# Patient Record
Sex: Male | Born: 1940 | Race: White | Hispanic: No | Marital: Married | Smoking: Never smoker
Health system: Southern US, Community
[De-identification: ages and names within clinical notes are randomized; demographics above are authoritative.]

## PROBLEM LIST (undated history)

## (undated) DIAGNOSIS — E291 Testicular hypofunction: Secondary | ICD-10-CM

## (undated) DIAGNOSIS — C8590 Non-Hodgkin lymphoma, unspecified, unspecified site: Secondary | ICD-10-CM

## (undated) DIAGNOSIS — T45515A Adverse effect of anticoagulants, initial encounter: Secondary | ICD-10-CM

## (undated) DIAGNOSIS — I10 Essential (primary) hypertension: Secondary | ICD-10-CM

## (undated) DIAGNOSIS — I719 Aortic aneurysm of unspecified site, without rupture: Secondary | ICD-10-CM

## (undated) DIAGNOSIS — I4819 Other persistent atrial fibrillation: Secondary | ICD-10-CM

## (undated) DIAGNOSIS — E785 Hyperlipidemia, unspecified: Secondary | ICD-10-CM

## (undated) DIAGNOSIS — E0590 Thyrotoxicosis, unspecified without thyrotoxic crisis or storm: Secondary | ICD-10-CM

## (undated) HISTORY — PX: BACK SURGERY: SHX140

## (undated) HISTORY — DX: Adverse effect of anticoagulants, initial encounter: T45.515A

## (undated) HISTORY — PX: TONSILLECTOMY: SUR1361

## (undated) HISTORY — PX: TOTAL KNEE ARTHROPLASTY: SHX125

## (undated) HISTORY — DX: Hyperlipidemia, unspecified: E78.5

## (undated) HISTORY — PX: LAMINECTOMY: SHX219

## (undated) HISTORY — DX: Testicular hypofunction: E29.1

## (undated) HISTORY — DX: Other persistent atrial fibrillation: I48.19

## (undated) HISTORY — DX: Aortic aneurysm of unspecified site, without rupture: I71.9

## (undated) HISTORY — DX: Thyrotoxicosis, unspecified without thyrotoxic crisis or storm: E05.90

## (undated) HISTORY — DX: Essential (primary) hypertension: I10

## (undated) HISTORY — DX: Non-Hodgkin lymphoma, unspecified, unspecified site: C85.90

## (undated) HISTORY — PX: ATRIAL ABLATION SURGERY: SHX560

## (undated) HISTORY — PX: APPENDECTOMY: SHX54

---

## 2005-05-22 ENCOUNTER — Ambulatory Visit: Payer: Self-pay | Admitting: Internal Medicine

## 2005-08-23 ENCOUNTER — Ambulatory Visit (HOSPITAL_COMMUNITY): Admission: RE | Admit: 2005-08-23 | Discharge: 2005-08-23 | Payer: Self-pay | Admitting: Internal Medicine

## 2005-08-23 ENCOUNTER — Ambulatory Visit: Payer: Self-pay | Admitting: Internal Medicine

## 2005-09-15 ENCOUNTER — Ambulatory Visit: Payer: Self-pay | Admitting: Cardiology

## 2005-09-22 ENCOUNTER — Ambulatory Visit: Payer: Self-pay | Admitting: Cardiovascular Disease

## 2005-10-05 ENCOUNTER — Ambulatory Visit: Payer: Self-pay | Admitting: Cardiology

## 2005-10-05 ENCOUNTER — Ambulatory Visit: Payer: Self-pay | Admitting: Endocrinology

## 2005-11-06 ENCOUNTER — Ambulatory Visit: Payer: Self-pay | Admitting: Endocrinology

## 2005-11-06 ENCOUNTER — Ambulatory Visit: Payer: Self-pay | Admitting: Internal Medicine

## 2006-01-08 ENCOUNTER — Ambulatory Visit: Payer: Self-pay | Admitting: Cardiovascular Disease

## 2006-01-29 ENCOUNTER — Ambulatory Visit: Payer: Self-pay

## 2006-01-29 ENCOUNTER — Ambulatory Visit: Payer: Self-pay | Admitting: Cardiology

## 2006-01-29 HISTORY — PX: OTHER SURGICAL HISTORY: SHX169

## 2006-02-06 ENCOUNTER — Ambulatory Visit: Payer: Self-pay | Admitting: Internal Medicine

## 2006-02-08 ENCOUNTER — Ambulatory Visit: Payer: Self-pay

## 2006-02-08 ENCOUNTER — Encounter: Payer: Self-pay | Admitting: Cardiology

## 2006-02-08 ENCOUNTER — Ambulatory Visit: Payer: Self-pay | Admitting: Internal Medicine

## 2006-02-08 HISTORY — PX: TRANSTHORACIC ECHOCARDIOGRAM: SHX275

## 2006-02-12 ENCOUNTER — Ambulatory Visit: Payer: Self-pay | Admitting: Cardiology

## 2006-02-21 ENCOUNTER — Ambulatory Visit: Payer: Self-pay | Admitting: Cardiology

## 2006-02-21 ENCOUNTER — Ambulatory Visit: Payer: Self-pay

## 2006-02-26 ENCOUNTER — Ambulatory Visit: Payer: Self-pay | Admitting: Family Medicine

## 2006-03-12 ENCOUNTER — Ambulatory Visit: Payer: Self-pay | Admitting: Cardiology

## 2006-04-04 ENCOUNTER — Ambulatory Visit: Payer: Self-pay | Admitting: Internal Medicine

## 2006-04-13 ENCOUNTER — Ambulatory Visit: Payer: Self-pay | Admitting: Internal Medicine

## 2006-05-01 ENCOUNTER — Ambulatory Visit: Payer: Self-pay | Admitting: Cardiology

## 2006-05-07 ENCOUNTER — Ambulatory Visit: Payer: Self-pay | Admitting: Cardiology

## 2006-05-14 ENCOUNTER — Ambulatory Visit: Payer: Self-pay | Admitting: Cardiology

## 2006-05-28 ENCOUNTER — Ambulatory Visit: Payer: Self-pay | Admitting: Cardiology

## 2006-06-15 ENCOUNTER — Ambulatory Visit: Payer: Self-pay | Admitting: Cardiovascular Disease

## 2006-07-04 ENCOUNTER — Ambulatory Visit: Payer: Self-pay | Admitting: Cardiology

## 2006-08-31 ENCOUNTER — Ambulatory Visit: Payer: Self-pay | Admitting: Cardiology

## 2006-09-20 ENCOUNTER — Ambulatory Visit: Payer: Self-pay | Admitting: Internal Medicine

## 2006-09-26 ENCOUNTER — Ambulatory Visit: Payer: Self-pay | Admitting: Internal Medicine

## 2006-09-26 ENCOUNTER — Inpatient Hospital Stay (HOSPITAL_COMMUNITY): Admission: EM | Admit: 2006-09-26 | Discharge: 2006-09-26 | Payer: Self-pay | Admitting: Emergency Medicine

## 2006-09-26 ENCOUNTER — Encounter: Payer: Self-pay | Admitting: Internal Medicine

## 2006-10-04 ENCOUNTER — Ambulatory Visit: Payer: Self-pay | Admitting: Cardiology

## 2006-10-29 ENCOUNTER — Ambulatory Visit: Payer: Self-pay | Admitting: Internal Medicine

## 2006-11-01 ENCOUNTER — Ambulatory Visit: Payer: Self-pay | Admitting: Cardiology

## 2006-11-22 ENCOUNTER — Ambulatory Visit: Payer: Self-pay | Admitting: Cardiology

## 2006-12-05 ENCOUNTER — Encounter: Payer: Self-pay | Admitting: Endocrinology

## 2006-12-05 DIAGNOSIS — I4891 Unspecified atrial fibrillation: Secondary | ICD-10-CM | POA: Insufficient documentation

## 2006-12-05 DIAGNOSIS — E0590 Thyrotoxicosis, unspecified without thyrotoxic crisis or storm: Secondary | ICD-10-CM | POA: Insufficient documentation

## 2006-12-14 ENCOUNTER — Ambulatory Visit: Payer: Self-pay | Admitting: Cardiology

## 2007-01-17 ENCOUNTER — Ambulatory Visit: Payer: Self-pay | Admitting: Cardiology

## 2007-02-22 ENCOUNTER — Ambulatory Visit: Payer: Self-pay | Admitting: Internal Medicine

## 2007-03-05 ENCOUNTER — Ambulatory Visit: Payer: Self-pay | Admitting: Cardiology

## 2007-03-12 ENCOUNTER — Emergency Department (HOSPITAL_COMMUNITY): Admission: EM | Admit: 2007-03-12 | Discharge: 2007-03-12 | Payer: Self-pay | Admitting: Emergency Medicine

## 2007-03-19 ENCOUNTER — Ambulatory Visit: Payer: Self-pay | Admitting: Cardiology

## 2007-04-12 ENCOUNTER — Ambulatory Visit: Payer: Self-pay | Admitting: Cardiology

## 2007-04-17 ENCOUNTER — Ambulatory Visit: Payer: Self-pay | Admitting: Cardiovascular Disease

## 2007-04-25 ENCOUNTER — Ambulatory Visit: Payer: Self-pay | Admitting: Cardiology

## 2007-04-25 ENCOUNTER — Ambulatory Visit: Payer: Self-pay | Admitting: Internal Medicine

## 2007-05-10 ENCOUNTER — Ambulatory Visit: Payer: Self-pay | Admitting: Cardiology

## 2007-05-10 LAB — CONVERTED CEMR LAB
INR: 3.8 — ABNORMAL HIGH (ref 0.8–1.0)
Prothrombin Time: 24.8 s — ABNORMAL HIGH (ref 10.9–13.3)

## 2007-05-22 ENCOUNTER — Ambulatory Visit: Payer: Self-pay | Admitting: Cardiology

## 2007-06-04 ENCOUNTER — Ambulatory Visit: Payer: Self-pay | Admitting: Internal Medicine

## 2007-06-17 ENCOUNTER — Ambulatory Visit: Payer: Self-pay | Admitting: Internal Medicine

## 2007-06-18 ENCOUNTER — Ambulatory Visit: Payer: Self-pay | Admitting: Cardiology

## 2007-07-09 ENCOUNTER — Ambulatory Visit: Payer: Self-pay | Admitting: Cardiology

## 2007-07-25 ENCOUNTER — Ambulatory Visit: Payer: Self-pay | Admitting: Internal Medicine

## 2007-08-12 ENCOUNTER — Telehealth: Payer: Self-pay | Admitting: Internal Medicine

## 2007-08-15 ENCOUNTER — Ambulatory Visit: Payer: Self-pay | Admitting: Internal Medicine

## 2007-10-07 ENCOUNTER — Ambulatory Visit: Payer: Self-pay | Admitting: Internal Medicine

## 2007-10-25 ENCOUNTER — Ambulatory Visit: Payer: Self-pay | Admitting: Cardiology

## 2007-11-15 ENCOUNTER — Ambulatory Visit: Payer: Self-pay | Admitting: Cardiovascular Disease

## 2007-12-03 ENCOUNTER — Ambulatory Visit: Payer: Self-pay | Admitting: Cardiology

## 2007-12-31 ENCOUNTER — Ambulatory Visit: Payer: Self-pay | Admitting: Internal Medicine

## 2008-01-28 ENCOUNTER — Ambulatory Visit: Payer: Self-pay | Admitting: Cardiology

## 2008-02-25 ENCOUNTER — Ambulatory Visit: Payer: Self-pay | Admitting: Cardiovascular Disease

## 2008-03-03 ENCOUNTER — Ambulatory Visit: Payer: Self-pay | Admitting: Internal Medicine

## 2008-03-03 ENCOUNTER — Encounter: Payer: Self-pay | Admitting: Internal Medicine

## 2008-03-03 ENCOUNTER — Ambulatory Visit: Payer: Self-pay | Admitting: Cardiovascular Disease

## 2008-03-03 ENCOUNTER — Ambulatory Visit (HOSPITAL_COMMUNITY): Admission: RE | Admit: 2008-03-03 | Discharge: 2008-03-03 | Payer: Self-pay | Admitting: Internal Medicine

## 2008-03-10 ENCOUNTER — Ambulatory Visit: Payer: Self-pay | Admitting: Cardiology

## 2008-03-23 ENCOUNTER — Ambulatory Visit: Payer: Self-pay | Admitting: Cardiology

## 2008-03-23 ENCOUNTER — Ambulatory Visit: Payer: Self-pay | Admitting: Internal Medicine

## 2008-03-23 LAB — CONVERTED CEMR LAB
ALT: 17 units/L (ref 0–53)
AST: 16 units/L (ref 0–37)
Alkaline Phosphatase: 78 units/L (ref 39–117)
Bilirubin, Direct: 0.1 mg/dL (ref 0.0–0.3)
CO2: 31 meq/L (ref 19–32)
CRP, High Sensitivity: <1 — ABNORMAL LOW (ref 0.00–5.00)
Chloride: 104 meq/L (ref 96–112)
Creatinine, Ser: 0.7 mg/dL (ref 0.4–1.5)
GFR calc non Af Amer: 119 mL/min
LDL Cholesterol: 121 mg/dL — ABNORMAL HIGH (ref 0–99)
Potassium: 4.3 meq/L (ref 3.5–5.1)
Total Bilirubin: 1.1 mg/dL (ref 0.3–1.2)
Total CHOL/HDL Ratio: 4.8
Triglycerides: 65 mg/dL (ref 0–149)

## 2008-04-24 ENCOUNTER — Ambulatory Visit: Payer: Self-pay | Admitting: Cardiology

## 2008-04-27 ENCOUNTER — Ambulatory Visit: Payer: Self-pay | Admitting: Internal Medicine

## 2008-05-21 ENCOUNTER — Ambulatory Visit: Payer: Self-pay | Admitting: Internal Medicine

## 2008-05-28 ENCOUNTER — Ambulatory Visit: Payer: Self-pay | Admitting: Internal Medicine

## 2008-06-19 ENCOUNTER — Ambulatory Visit: Payer: Self-pay | Admitting: Cardiology

## 2008-07-16 ENCOUNTER — Ambulatory Visit: Payer: Self-pay | Admitting: Internal Medicine

## 2008-08-18 ENCOUNTER — Ambulatory Visit: Payer: Self-pay | Admitting: Internal Medicine

## 2008-09-01 ENCOUNTER — Ambulatory Visit: Payer: Self-pay | Admitting: Cardiology

## 2008-09-01 ENCOUNTER — Encounter: Payer: Self-pay | Admitting: *Deleted

## 2008-09-10 ENCOUNTER — Telehealth: Payer: Self-pay | Admitting: Internal Medicine

## 2008-09-16 ENCOUNTER — Encounter: Payer: Self-pay | Admitting: Internal Medicine

## 2008-09-25 ENCOUNTER — Ambulatory Visit: Payer: Self-pay | Admitting: Cardiology

## 2008-09-25 ENCOUNTER — Encounter (INDEPENDENT_AMBULATORY_CARE_PROVIDER_SITE_OTHER): Payer: Self-pay | Admitting: Pharmacist

## 2008-09-25 LAB — CONVERTED CEMR LAB
POC INR: 2
Prothrombin Time: 17.3 s

## 2008-10-07 ENCOUNTER — Encounter: Payer: Self-pay | Admitting: *Deleted

## 2008-10-23 ENCOUNTER — Ambulatory Visit: Payer: Self-pay | Admitting: Internal Medicine

## 2008-10-23 LAB — CONVERTED CEMR LAB
POC INR: 2.7
Prothrombin Time: 19.9 s

## 2008-11-16 ENCOUNTER — Telehealth: Payer: Self-pay | Admitting: Internal Medicine

## 2008-11-16 ENCOUNTER — Ambulatory Visit: Payer: Self-pay | Admitting: Cardiovascular Disease

## 2008-11-16 ENCOUNTER — Ambulatory Visit: Payer: Self-pay | Admitting: Internal Medicine

## 2008-11-16 DIAGNOSIS — E785 Hyperlipidemia, unspecified: Secondary | ICD-10-CM | POA: Insufficient documentation

## 2008-11-16 DIAGNOSIS — I712 Thoracic aortic aneurysm, without rupture, unspecified: Secondary | ICD-10-CM | POA: Insufficient documentation

## 2008-11-16 LAB — CONVERTED CEMR LAB: POC INR: 2.6

## 2008-11-17 ENCOUNTER — Ambulatory Visit: Payer: Self-pay | Admitting: Internal Medicine

## 2008-11-17 ENCOUNTER — Telehealth: Payer: Self-pay | Admitting: Internal Medicine

## 2008-11-17 ENCOUNTER — Ambulatory Visit (HOSPITAL_COMMUNITY): Admission: RE | Admit: 2008-11-17 | Discharge: 2008-11-17 | Payer: Self-pay | Admitting: Internal Medicine

## 2008-11-25 ENCOUNTER — Ambulatory Visit: Payer: Self-pay | Admitting: Internal Medicine

## 2008-11-26 ENCOUNTER — Encounter: Payer: Self-pay | Admitting: Internal Medicine

## 2008-12-08 ENCOUNTER — Telehealth (INDEPENDENT_AMBULATORY_CARE_PROVIDER_SITE_OTHER): Payer: Self-pay | Admitting: *Deleted

## 2008-12-09 ENCOUNTER — Ambulatory Visit: Payer: Self-pay | Admitting: Internal Medicine

## 2008-12-09 LAB — CONVERTED CEMR LAB: POC INR: 4.2

## 2008-12-11 ENCOUNTER — Ambulatory Visit: Payer: Self-pay | Admitting: Family Medicine

## 2008-12-11 DIAGNOSIS — L0390 Cellulitis, unspecified: Secondary | ICD-10-CM

## 2008-12-11 DIAGNOSIS — L0291 Cutaneous abscess, unspecified: Secondary | ICD-10-CM | POA: Insufficient documentation

## 2008-12-21 ENCOUNTER — Ambulatory Visit: Payer: Self-pay | Admitting: Cardiology

## 2008-12-22 ENCOUNTER — Telehealth: Payer: Self-pay | Admitting: Internal Medicine

## 2008-12-25 ENCOUNTER — Encounter (INDEPENDENT_AMBULATORY_CARE_PROVIDER_SITE_OTHER): Payer: Self-pay | Admitting: *Deleted

## 2008-12-28 ENCOUNTER — Ambulatory Visit: Payer: Self-pay | Admitting: Internal Medicine

## 2008-12-28 DIAGNOSIS — R0602 Shortness of breath: Secondary | ICD-10-CM | POA: Insufficient documentation

## 2008-12-31 ENCOUNTER — Encounter: Payer: Self-pay | Admitting: Internal Medicine

## 2008-12-31 ENCOUNTER — Telehealth: Payer: Self-pay | Admitting: Internal Medicine

## 2009-01-04 ENCOUNTER — Ambulatory Visit: Payer: Self-pay | Admitting: Cardiovascular Disease

## 2009-01-11 ENCOUNTER — Ambulatory Visit: Payer: Self-pay | Admitting: Internal Medicine

## 2009-01-11 LAB — CONVERTED CEMR LAB: POC INR: 2.2

## 2009-01-14 ENCOUNTER — Ambulatory Visit: Payer: Self-pay | Admitting: Cardiovascular Disease

## 2009-01-14 ENCOUNTER — Ambulatory Visit (HOSPITAL_COMMUNITY): Admission: RE | Admit: 2009-01-14 | Discharge: 2009-01-14 | Payer: Self-pay | Admitting: Internal Medicine

## 2009-01-18 ENCOUNTER — Ambulatory Visit: Payer: Self-pay | Admitting: Internal Medicine

## 2009-01-18 LAB — CONVERTED CEMR LAB
Basophils Relative: 0.6 % (ref 0.0–3.0)
Chloride: 99 meq/L (ref 96–112)
Eosinophils Relative: 3.7 % (ref 0.0–5.0)
HCT: 38.7 % — ABNORMAL LOW (ref 39.0–52.0)
Lymphs Abs: 1.4 10*3/uL (ref 0.7–4.0)
MCV: 90.3 fL (ref 78.0–100.0)
Monocytes Absolute: 0.7 10*3/uL (ref 0.1–1.0)
Monocytes Relative: 13.7 % — ABNORMAL HIGH (ref 3.0–12.0)
Neutrophils Relative %: 55.5 % (ref 43.0–77.0)
POC INR: 2.5
Potassium: 4.4 meq/L (ref 3.5–5.1)
Prothrombin Time: 23 s — ABNORMAL HIGH (ref 9.1–11.7)
RBC: 4.29 M/uL (ref 4.22–5.81)
WBC: 5.3 10*3/uL (ref 4.5–10.5)

## 2009-01-25 ENCOUNTER — Ambulatory Visit: Payer: Self-pay | Admitting: Cardiology

## 2009-01-25 ENCOUNTER — Ambulatory Visit (HOSPITAL_COMMUNITY): Admission: RE | Admit: 2009-01-25 | Discharge: 2009-01-25 | Payer: Self-pay | Admitting: Cardiology

## 2009-01-25 ENCOUNTER — Encounter: Payer: Self-pay | Admitting: Cardiology

## 2009-01-26 ENCOUNTER — Ambulatory Visit: Payer: Self-pay | Admitting: Internal Medicine

## 2009-01-26 ENCOUNTER — Ambulatory Visit (HOSPITAL_COMMUNITY): Admission: RE | Admit: 2009-01-26 | Discharge: 2009-01-27 | Payer: Self-pay | Admitting: Internal Medicine

## 2009-02-02 ENCOUNTER — Telehealth: Payer: Self-pay | Admitting: Internal Medicine

## 2009-02-10 ENCOUNTER — Ambulatory Visit: Payer: Self-pay | Admitting: Internal Medicine

## 2009-02-10 LAB — CONVERTED CEMR LAB: POC INR: 2.7

## 2009-03-09 ENCOUNTER — Ambulatory Visit: Payer: Self-pay | Admitting: Cardiovascular Disease

## 2009-03-09 LAB — CONVERTED CEMR LAB: POC INR: 2.8

## 2009-04-06 ENCOUNTER — Ambulatory Visit: Payer: Self-pay | Admitting: Cardiology

## 2009-04-06 LAB — CONVERTED CEMR LAB: POC INR: 2.9

## 2009-05-04 ENCOUNTER — Ambulatory Visit: Payer: Self-pay | Admitting: Internal Medicine

## 2009-06-03 ENCOUNTER — Ambulatory Visit: Payer: Self-pay | Admitting: Internal Medicine

## 2009-07-20 ENCOUNTER — Ambulatory Visit: Payer: Self-pay | Admitting: Cardiovascular Disease

## 2009-07-20 LAB — CONVERTED CEMR LAB: POC INR: 2.6

## 2009-07-28 ENCOUNTER — Telehealth: Payer: Self-pay | Admitting: Internal Medicine

## 2009-08-19 ENCOUNTER — Encounter: Payer: Self-pay | Admitting: Internal Medicine

## 2009-08-23 ENCOUNTER — Ambulatory Visit (HOSPITAL_COMMUNITY): Admission: RE | Admit: 2009-08-23 | Discharge: 2009-08-23 | Payer: Self-pay | Admitting: Internal Medicine

## 2009-08-23 ENCOUNTER — Ambulatory Visit: Payer: Self-pay | Admitting: Cardiology

## 2009-08-23 ENCOUNTER — Encounter: Payer: Self-pay | Admitting: Internal Medicine

## 2009-08-23 ENCOUNTER — Ambulatory Visit: Payer: Self-pay

## 2009-08-25 ENCOUNTER — Encounter (INDEPENDENT_AMBULATORY_CARE_PROVIDER_SITE_OTHER): Payer: Self-pay | Admitting: *Deleted

## 2009-09-07 ENCOUNTER — Ambulatory Visit: Payer: Self-pay | Admitting: Cardiology

## 2009-09-08 ENCOUNTER — Encounter (INDEPENDENT_AMBULATORY_CARE_PROVIDER_SITE_OTHER): Payer: Self-pay | Admitting: *Deleted

## 2009-09-30 ENCOUNTER — Ambulatory Visit: Payer: Self-pay | Admitting: Cardiology

## 2009-09-30 LAB — CONVERTED CEMR LAB: POC INR: 2.5

## 2009-10-26 ENCOUNTER — Encounter: Payer: Self-pay | Admitting: Cardiology

## 2009-10-26 ENCOUNTER — Encounter: Payer: Self-pay | Admitting: Internal Medicine

## 2009-10-26 LAB — CONVERTED CEMR LAB: POC INR: 2.3

## 2009-11-05 ENCOUNTER — Encounter: Payer: Self-pay | Admitting: Internal Medicine

## 2009-11-05 ENCOUNTER — Encounter: Payer: Self-pay | Admitting: Cardiovascular Disease

## 2009-11-12 ENCOUNTER — Encounter: Payer: Self-pay | Admitting: Internal Medicine

## 2009-11-12 LAB — CONVERTED CEMR LAB: POC INR: 2.4

## 2009-11-19 ENCOUNTER — Encounter: Payer: Self-pay | Admitting: Cardiology

## 2009-11-19 ENCOUNTER — Ambulatory Visit: Payer: Self-pay | Admitting: Cardiology

## 2009-11-19 ENCOUNTER — Encounter: Payer: Self-pay | Admitting: Internal Medicine

## 2009-11-19 LAB — CONVERTED CEMR LAB: POC INR: 1.9

## 2009-11-26 ENCOUNTER — Encounter: Payer: Self-pay | Admitting: Internal Medicine

## 2009-11-26 ENCOUNTER — Encounter: Payer: Self-pay | Admitting: Cardiology

## 2009-11-26 LAB — CONVERTED CEMR LAB: POC INR: 2.3

## 2009-12-08 ENCOUNTER — Encounter: Payer: Self-pay | Admitting: Internal Medicine

## 2009-12-09 ENCOUNTER — Encounter: Payer: Self-pay | Admitting: Internal Medicine

## 2009-12-15 ENCOUNTER — Encounter: Payer: Self-pay | Admitting: Internal Medicine

## 2009-12-16 ENCOUNTER — Encounter: Payer: Self-pay | Admitting: Internal Medicine

## 2009-12-22 ENCOUNTER — Encounter: Payer: Self-pay | Admitting: Internal Medicine

## 2009-12-22 ENCOUNTER — Encounter: Payer: Self-pay | Admitting: Cardiology

## 2009-12-22 LAB — CONVERTED CEMR LAB: POC INR: 2.5

## 2009-12-29 ENCOUNTER — Encounter: Payer: Self-pay | Admitting: Internal Medicine

## 2010-01-05 ENCOUNTER — Encounter: Payer: Self-pay | Admitting: Internal Medicine

## 2010-01-05 ENCOUNTER — Encounter: Payer: Self-pay | Admitting: Cardiovascular Disease

## 2010-01-12 ENCOUNTER — Encounter: Payer: Self-pay | Admitting: Internal Medicine

## 2010-01-19 ENCOUNTER — Encounter: Payer: Self-pay | Admitting: Internal Medicine

## 2010-01-26 ENCOUNTER — Encounter: Payer: Self-pay | Admitting: Internal Medicine

## 2010-01-26 LAB — CONVERTED CEMR LAB: POC INR: 2.2

## 2010-02-02 ENCOUNTER — Encounter: Payer: Self-pay | Admitting: Internal Medicine

## 2010-02-02 ENCOUNTER — Encounter: Payer: Self-pay | Admitting: Cardiology

## 2010-02-02 LAB — CONVERTED CEMR LAB: POC INR: 3.1

## 2010-02-09 ENCOUNTER — Encounter: Payer: Self-pay | Admitting: Cardiovascular Disease

## 2010-02-09 ENCOUNTER — Encounter: Payer: Self-pay | Admitting: Internal Medicine

## 2010-02-09 LAB — CONVERTED CEMR LAB: POC INR: 2.1

## 2010-02-10 ENCOUNTER — Encounter: Payer: Self-pay | Admitting: Cardiovascular Disease

## 2010-02-14 ENCOUNTER — Telehealth: Payer: Self-pay | Admitting: Internal Medicine

## 2010-02-16 ENCOUNTER — Encounter: Payer: Self-pay | Admitting: Cardiology

## 2010-02-16 LAB — CONVERTED CEMR LAB: POC INR: 3.1

## 2010-02-25 ENCOUNTER — Encounter: Payer: Self-pay | Admitting: Cardiology

## 2010-02-25 LAB — CONVERTED CEMR LAB: POC INR: 2.3

## 2010-03-02 ENCOUNTER — Encounter: Payer: Self-pay | Admitting: Internal Medicine

## 2010-03-14 ENCOUNTER — Encounter: Payer: Self-pay | Admitting: Cardiology

## 2010-03-14 ENCOUNTER — Encounter: Payer: Self-pay | Admitting: Internal Medicine

## 2010-03-15 ENCOUNTER — Encounter: Payer: Self-pay | Admitting: Cardiology

## 2010-03-18 ENCOUNTER — Encounter: Payer: Self-pay | Admitting: Internal Medicine

## 2010-03-18 ENCOUNTER — Ambulatory Visit: Payer: Self-pay | Admitting: Internal Medicine

## 2010-03-18 ENCOUNTER — Ambulatory Visit (HOSPITAL_COMMUNITY)
Admission: RE | Admit: 2010-03-18 | Discharge: 2010-03-18 | Payer: Self-pay | Source: Home / Self Care | Attending: Internal Medicine | Admitting: Internal Medicine

## 2010-03-19 LAB — CONVERTED CEMR LAB
BUN: 22 mg/dL (ref 6–23)
Basophils Absolute: 0 10*3/uL (ref 0.0–0.1)
Calcium: 9.2 mg/dL (ref 8.4–10.5)
Eosinophils Absolute: 0.2 10*3/uL (ref 0.0–0.7)
GFR calc non Af Amer: 109.12 mL/min (ref >60.00)
Glucose, Bld: 129 mg/dL — ABNORMAL HIGH (ref 70–99)
HCT: 41.7 % (ref 39.0–52.0)
Lymphs Abs: 1.5 10*3/uL (ref 0.7–4.0)
Monocytes Relative: 12.7 % — ABNORMAL HIGH (ref 3.0–12.0)
Platelets: 223 10*3/uL (ref 150.0–400.0)
Potassium: 4.1 meq/L (ref 3.5–5.1)
Prothrombin Time: 34 s — ABNORMAL HIGH (ref 9.7–11.8)
RDW: 13.5 % (ref 11.5–14.6)

## 2010-03-23 ENCOUNTER — Encounter: Payer: Self-pay | Admitting: Internal Medicine

## 2010-03-23 ENCOUNTER — Encounter: Payer: Self-pay | Admitting: Cardiology

## 2010-03-24 ENCOUNTER — Encounter: Payer: Self-pay | Admitting: Cardiology

## 2010-03-30 ENCOUNTER — Encounter: Payer: Self-pay | Admitting: Internal Medicine

## 2010-03-30 ENCOUNTER — Encounter: Payer: Self-pay | Admitting: Cardiology

## 2010-03-30 LAB — CONVERTED CEMR LAB: POC INR: 3.1

## 2010-04-06 ENCOUNTER — Encounter: Payer: Self-pay | Admitting: Internal Medicine

## 2010-04-06 ENCOUNTER — Encounter: Payer: Self-pay | Admitting: Cardiology

## 2010-04-07 ENCOUNTER — Encounter: Payer: Self-pay | Admitting: Cardiology

## 2010-04-12 ENCOUNTER — Encounter: Payer: Self-pay | Admitting: Internal Medicine

## 2010-04-13 ENCOUNTER — Encounter: Payer: Self-pay | Admitting: Internal Medicine

## 2010-04-18 ENCOUNTER — Other Ambulatory Visit: Payer: Self-pay | Admitting: Internal Medicine

## 2010-04-18 ENCOUNTER — Encounter: Payer: Self-pay | Admitting: Internal Medicine

## 2010-04-18 ENCOUNTER — Ambulatory Visit
Admission: RE | Admit: 2010-04-18 | Discharge: 2010-04-18 | Payer: Self-pay | Source: Home / Self Care | Attending: Internal Medicine | Admitting: Internal Medicine

## 2010-04-19 LAB — HEPATIC FUNCTION PANEL
ALT: 26 U/L (ref 0–53)
AST: 25 U/L (ref 0–37)
Albumin: 4.6 g/dL (ref 3.5–5.2)
Alkaline Phosphatase: 74 U/L (ref 39–117)
Bilirubin, Direct: 0.1 mg/dL (ref 0.0–0.3)
Total Bilirubin: 0.8 mg/dL (ref 0.3–1.2)
Total Protein: 7.3 g/dL (ref 6.0–8.3)

## 2010-04-19 LAB — T4, FREE: Free T4: 1.15 ng/dL (ref 0.60–1.60)

## 2010-04-19 LAB — TSH: TSH: 0.83 u[IU]/mL (ref 0.35–5.50)

## 2010-04-20 ENCOUNTER — Encounter: Payer: Self-pay | Admitting: Internal Medicine

## 2010-04-21 ENCOUNTER — Ambulatory Visit: Admit: 2010-04-21 | Payer: Self-pay | Admitting: Internal Medicine

## 2010-04-21 ENCOUNTER — Encounter: Payer: Self-pay | Admitting: Internal Medicine

## 2010-04-25 ENCOUNTER — Encounter: Payer: Self-pay | Admitting: Internal Medicine

## 2010-04-27 ENCOUNTER — Encounter: Payer: Self-pay | Admitting: Cardiovascular Disease

## 2010-04-27 ENCOUNTER — Encounter: Payer: Self-pay | Admitting: Internal Medicine

## 2010-04-27 LAB — CONVERTED CEMR LAB: POC INR: 2.4

## 2010-05-01 LAB — CONVERTED CEMR LAB
ALT: 27 units/L (ref 0–53)
AST: 21 units/L (ref 0–37)
Albumin: 4.4 g/dL (ref 3.5–5.2)
Alkaline Phosphatase: 84 units/L (ref 39–117)
CO2: 25 meq/L (ref 19–32)
Calcium: 9.3 mg/dL (ref 8.4–10.5)
Cholesterol: 134 mg/dL (ref 0–200)
Creatinine, Ser: 0.78 mg/dL (ref 0.40–1.50)
HDL: 43 mg/dL (ref >39)
Sodium: 139 meq/L (ref 135–145)
Total Bilirubin: 1 mg/dL (ref 0.3–1.2)
Total CHOL/HDL Ratio: 3.1
Triglycerides: 92 mg/dL (ref <150)

## 2010-05-03 NOTE — Medication Information (Signed)
Summary: Antonio Carrillo  Anticoagulant Therapy  Managed by: Bethena Midget, RN, BSN Referring MD: Arvilla Meres PCP: Jayme Cloud, MD Supervising MD: Jens Som MD, Arlys John Indication 1: Atrial Fibrillation (ICD-427.31) Indication 2: Pending Ablation (10/26) Lab Used: [facility] Site: [facility] INR POC 1.6 INR RANGE 2 - 3  Dietary changes: no    Health status changes: no    Bleeding/hemorrhagic complications: no    Recent/future hospitalizations: no    Any changes in medication regimen? no    Recent/future dental: no  Any missed doses?: no       Is patient compliant with meds? yes       Allergies: No Known Drug Allergies  Anticoagulation Management History:      The patient is taking warfarin and comes in today for a routine follow up visit.  Positive risk factors for bleeding include an age of 72 years or older.  The bleeding index is 'intermediate risk'.  Negative CHADS2 values include Age > 72 years old.  The start date was 08/15/2005.  His last INR was 2.2 ratio.  Anticoagulation responsible provider: Jens Som MD, Arlys John.  INR POC: 1.6.  Cuvette Lot#: [id number].  Exp: 11/2010.    Anticoagulation Management Assessment/Plan:      The patient's current anticoagulation dose is Coumadin 2 mg tabs: Take as directed by coumadin clinic..  The target INR is 2 - 3.  The next INR is due 09/06/2009.  Anticoagulation instructions were given to patient.  Results were reviewed/authorized by Bethena Midget, RN, BSN.  He was notified by Bethena Midget, RN, BSN.         Prior Anticoagulation Instructions: INR 2.6  Continue on same dosage 1/2 tablet daily except 1 tablet on Mondays.  Recheck in 4 weeks.    Current Anticoagulation Instructions: INR 1.6 Today take 3mg  then resume 1mg s everyday except 2mg s on Mondays.

## 2010-05-03 NOTE — Medication Information (Signed)
Summary: rov/tm  Anticoagulant Therapy  Managed by: Bethena Midget, RN, BSN Referring MD: Arvilla Meres PCP: Jayme Cloud, MD Supervising MD: Juanda Chance MD, Nissim Fleischer Indication 1: Atrial Fibrillation (ICD-427.31) Indication 2: Pending Ablation (10/26) Lab Used: [facility] Site: [facility] INR POC 2.9 INR RANGE 2 - 3  Dietary changes: no    Health status changes: no    Bleeding/hemorrhagic complications: no    Recent/future hospitalizations: no    Any changes in medication regimen? no    Recent/future dental: no  Any missed doses?: no       Is patient compliant with meds? yes       Allergies (verified): No Known Drug Allergies  Anticoagulation Management History:      The patient is taking warfarin and comes in today for a routine follow up visit.  Positive risk factors for bleeding include an age of 72 years or older.  The bleeding index is 'intermediate risk'.  Negative CHADS2 values include Age > 72 years old old.  The start date was 08/15/2005.  His last INR was 2.2 ratio.  Anticoagulation responsible provider: Juanda Chance MD, Smitty Cords.  INR POC: 2.9.  Cuvette Lot#: [id number].  Exp: 05/2010.    Anticoagulation Management Assessment/Plan:      The patient's current anticoagulation dose is Coumadin 2 mg tabs: Take as directed by coumadin clinic..  The target INR is 2 - 3.  The next INR is due 05/04/2009.  Anticoagulation instructions were given to patient.  Results were reviewed/authorized by Bethena Midget, RN, BSN.  He was notified by Bethena Midget, RN, BSN.         Prior Anticoagulation Instructions: INR 2.8 Continue 1mg s everyday except 2mg s on Mondays and Fridays. Recheck in 4 weeks.   Current Anticoagulation Instructions: INR 2.9 Continue 1mg s daily except 2mg s on Mondays and Thursdays. Recheck in 4 weeks.

## 2010-05-03 NOTE — Letter (Signed)
Summary: Appointment - Reschedule  [facility]     [place], [place]    Phone:   Fax:      Aug 25, 2009 MRN: [id number]   Antonio Carrillo [address] [place], [place]  [address]   Dear Mr. EPP,   Due to a change in our office schedule, your appointment on  09-13-2009 at  12:00              must be changed.  It is very important that we reach you to reschedule this appointment. We look forward to participating in your health care needs. Please contact us at the number listed above at your earliest convenience to reschedule this appointment.     Sincerely,     Lorne Skeens  [facility] Scheduling Team

## 2010-05-03 NOTE — Medication Information (Signed)
Summary: [facility]  Anticoagulant Therapy  Managed by: Bethena Midget, RN, BSN Referring MD: Arvilla Meres PCP: Jayme Cloud, MD Supervising MD: Graciela Husbands MD, Viviann Spare Indication 1: Atrial Fibrillation (ICD-427.31) Indication 2: Pending Ablation (10/26) Lab Used: [facility] Site: [facility] INR POC 2.5 INR RANGE 2 - 3  Dietary changes: no    Health status changes: no    Bleeding/hemorrhagic complications: no    Recent/future hospitalizations: no    Any changes in medication regimen? no    Recent/future dental: no  Any missed doses?: no       Is patient compliant with meds? yes      Comments: Self-tester  Allergies: No Known Drug Allergies  Anticoagulation Management History:      His anticoagulation is being managed by telephone today.  Positive risk factors for bleeding include an age of 70 years or older.  The bleeding index is 'intermediate risk'.  Negative CHADS2 values include Age > 8 years old.  The start date was 08/15/2005.  His last INR was 2.2 ratio.  Anticoagulation responsible Antonio Carrillo: Graciela Husbands MD, Viviann Spare.  INR POC: 2.5.    Anticoagulation Management Assessment/Plan:      The patient's current anticoagulation dose is Coumadin 2 mg tabs: Take as directed by coumadin clinic..  The target INR is 2 - 3.  The next INR is due 01/26/2010.  Anticoagulation instructions were given to patient.  Results were reviewed/authorized by Bethena Midget, RN, BSN.  He was notified by Bethena Midget, RN, BSN.         Prior Anticoagulation Instructions: INR 2.4 Continue 1mg  daily except 2mg s on Mondays and Fridays. Recheck in one week.  Self-tester.   Current Anticoagulation Instructions: INR 2.5 Continue 1mg s daily except 2mg s on M&F. Recheck in one week. Self-tester.

## 2010-05-03 NOTE — Medication Information (Signed)
Summary: [facility] Clinic  Anticoagulant Therapy  Managed by: [name], RN, BSN Referring MD: Arvilla Meres PCP: Jayme Cloud, MD Supervising MD: Ladona Ridgel MD, Sharlot Gowda Indication 1: Atrial Fibrillation (ICD-427.31) Indication 2: Pending Ablation (10/26) Lab Used: [facility] Site: [facility] INR POC 2.5 INR RANGE 2 - 3    Bleeding/hemorrhagic complications: no     Any changes in medication regimen? no     Any missed doses?: no       Is patient compliant with meds? yes       Allergies: No Known Drug Allergies  Anticoagulation Management History:      His anticoagulation is being managed by telephone today.  Positive risk factors for bleeding include an age of 70 years or older.  The bleeding index is 'intermediate risk'.  Negative CHADS2 values include Age > 59 years old.  The start date was 08/15/2005.  His last INR was 2.2 ratio.  Anticoagulation responsible provider: Ladona Ridgel MD, Sharlot Gowda.  INR POC: 2.5.  Exp: 12/2010.    Anticoagulation Management Assessment/Plan:      The patient's current anticoagulation dose is Coumadin 2 mg tabs: Take as directed by coumadin clinic..  The target INR is 2 - 3.  The next INR is due 12/16/2009.  Anticoagulation instructions were given to patient.  Results were reviewed/authorized by [name], RN, BSN.  He was notified by [name] RN.         Prior Anticoagulation Instructions: INR 2.3  Pt is a self-tester.  Called spoke with pt advised to continue on same dosage 1/2 tablet daily except 1 tablet on Mondays and Fridays.  Recheck on 12/08/09.  Current Anticoagulation Instructions: INR 2.5  Pt is a self-tester.  Called spoke with pt advised to continue on same dosage 1/2 tablet daily except 1 tablet on Mondays and Fridays.  Recheck in 1 week.

## 2010-05-03 NOTE — Medication Information (Signed)
Summary: rov/tm  Anticoagulant Therapy  Managed by: [name], RN, BSN Referring MD: Arvilla Meres PCP: Jayme Cloud, MD Supervising MD: Graciela Husbands MD, Viviann Spare Indication 1: Atrial Fibrillation (ICD-427.31) Indication 2: Pending Ablation (10/26) Lab Used: [facility] Site: [facility] INR POC 3.2 INR RANGE 2 - 3  Dietary changes: yes       Details: Pt reports having tried to incr vit K slightly.  Health status changes: no    Bleeding/hemorrhagic complications: no    Recent/future hospitalizations: no    Any changes in medication regimen? no    Recent/future dental: no  Any missed doses?: no       Is patient compliant with meds? yes       Allergies (verified): No Known Drug Allergies  Anticoagulation Management History:      The patient is taking warfarin and comes in today for a routine follow up visit.  Positive risk factors for bleeding include an age of 72 years or older.  The bleeding index is 'intermediate risk'.  Negative CHADS2 values include Age > 72 years old.  The start date was 08/15/2005.  His last INR was 2.2 ratio.  Anticoagulation responsible provider: Graciela Husbands MD, Viviann Spare.  INR POC: 3.2.  Cuvette Lot#: [id number].  Exp: 07/2010.    Anticoagulation Management Assessment/Plan:      The patient's current anticoagulation dose is Coumadin 2 mg tabs: Take as directed by coumadin clinic..  The target INR is 2 - 3.  The next INR is due 06/01/2009.  Anticoagulation instructions were given to patient.  Results were reviewed/authorized by [name], RN, BSN.  He was notified by [name] RN.         Prior Anticoagulation Instructions: INR 2.9 Continue 1mg s daily except 2mg s on Mondays and Thursdays. Recheck in 4 weeks.   Current Anticoagulation Instructions: INR 3.2  Start taking 1/2 tablet daily except 1 tablet on Mondays.  Recheck in 4 weeks.

## 2010-05-03 NOTE — Medication Information (Signed)
Summary: Coumadin Clinic  Anticoagulant Therapy  Managed by: Weston Brass, PharmD Referring MD: Arvilla Meres PCP: Jayme Cloud, MD Supervising MD: Johney Frame MD, Fayrene Fearing Indication 1: Atrial Fibrillation (ICD-427.31) Indication 2: Pending Ablation (10/26) Lab Used: [facility] Site: [facility] INR POC 2.7 INR RANGE 2 - 3  Dietary changes: no    Health status changes: no    Bleeding/hemorrhagic complications: no    Recent/future hospitalizations: no    Any changes in medication regimen? no    Recent/future dental: no  Any missed doses?: no       Is patient compliant with meds? yes       Allergies: No Known Drug Allergies  Anticoagulation Management History:      His anticoagulation is being managed by telephone today.  Positive risk factors for bleeding include an age of 72 years or older.  The bleeding index is 'intermediate risk'.  Negative CHADS2 values include Age > 51 years old.  The start date was 08/15/2005.  His last INR was 2.2 ratio.  Anticoagulation responsible provider: Allred MD, Fayrene Fearing.  INR POC: 2.7.  Exp: 12/2010.    Anticoagulation Management Assessment/Plan:      The patient's current anticoagulation dose is Coumadin 2 mg tabs: Take as directed by coumadin clinic..  The target INR is 2 - 3.  The next INR is due 12/22/2009.  Anticoagulation instructions were given to patient.  Results were reviewed/authorized by Weston Brass, PharmD.  He was notified by Weston Brass PharmD.         Prior Anticoagulation Instructions: INR 2.5  Pt is a self-tester.  Called spoke with pt advised to continue on same dosage 1/2 tablet daily except 1 tablet on Mondays and Fridays.  Recheck in 1 week.    Current Anticoagulation Instructions: INR 2.7

## 2010-05-03 NOTE — Medication Information (Signed)
Summary: rov/ewj  Anticoagulant Therapy  Managed by: [name], RN, BSN Referring MD: Arvilla Meres PCP: Jayme Cloud, MD Supervising MD: Excell Seltzer MD, Casimiro Needle Indication 1: Atrial Fibrillation (ICD-427.31) Indication 2: Pending Ablation (10/26) Lab Used: [facility] Site: [facility] INR POC 2.6 INR RANGE 2 - 3  Dietary changes: no    Health status changes: no    Bleeding/hemorrhagic complications: no    Recent/future hospitalizations: no    Any changes in medication regimen? no    Recent/future dental: no  Any missed doses?: no       Is patient compliant with meds? yes       Allergies (verified): No Known Drug Allergies  Anticoagulation Management History:      The patient is taking warfarin and comes in today for a routine follow up visit.  Positive risk factors for bleeding include an age of 72 years or older.  The bleeding index is 'intermediate risk'.  Negative CHADS2 values include Age > 53 years old.  The start date was 08/15/2005.  His last INR was 2.2 ratio.  Anticoagulation responsible provider: Excell Seltzer MD, Casimiro Needle.  INR POC: 2.6.  Cuvette Lot#: [id number].  Exp: 09/2010.    Anticoagulation Management Assessment/Plan:      The patient's current anticoagulation dose is Coumadin 2 mg tabs: Take as directed by coumadin clinic..  The target INR is 2 - 3.  The next INR is due 08/17/2009.  Anticoagulation instructions were given to patient.  Results were reviewed/authorized by [name], RN, BSN.  He was notified by [name] RN.         Prior Anticoagulation Instructions: INR 2.3  Continue on same dosage 1/2 tablet daily except 1 tablet on Mondays.  Recheck in 4 weeks.    Current Anticoagulation Instructions: INR 2.6  Continue on same dosage 1/2 tablet daily except 1 tablet on Mondays.  Recheck in 4 weeks.

## 2010-05-03 NOTE — Medication Information (Signed)
Summary: Coumadin Clinic   Anticoagulant Therapy  Managed by: Weston Brass, PharmD Referring MD: Arvilla Meres PCP: Jayme Cloud, MD Supervising MD: Juanda Chance MD, Bruce Indication 1: Atrial Fibrillation (ICD-427.31) Indication 2: Pending Ablation (10/26) Lab Used: [facility] Site: [facility] INR POC 2.5 INR RANGE 2 - 3  Dietary changes: no    Health status changes: no    Bleeding/hemorrhagic complications: no    Recent/future hospitalizations: no    Any changes in medication regimen? no    Recent/future dental: no  Any missed doses?: no       Is patient compliant with meds? yes       Allergies: No Known Drug Allergies  Anticoagulation Management History:      His anticoagulation is being managed by telephone today.  Positive risk factors for bleeding include an age of 72 years or older.  The bleeding index is 'intermediate risk'.  Negative CHADS2 values include Age > 72 years old.  The start date was 08/15/2005.  His last INR was 2.2 ratio.  Anticoagulation responsible Laytoya Ion: Juanda Chance MD, Smitty Cords.  INR POC: 2.5.  Exp: 12/2010.    Anticoagulation Management Assessment/Plan:      The patient's current anticoagulation dose is Coumadin 2 mg tabs: Take as directed by coumadin clinic..  The target INR is 2 - 3.  The next INR is due 01/05/2010.  Anticoagulation instructions were given to patient.  Results were reviewed/authorized by Weston Brass, PharmD.  He was notified by Weston Brass PharmD.         Prior Anticoagulation Instructions: INR 2.5  LMOM for pt. Weston Brass PharmD  December 22, 2009 9:59 AM  [facility] to call for dosing. Bethena Midget, RN, BSN  December 22, 2009 2:18 PM Attempted to call pt with results.  LMOM TCB. [name] RN  December 23, 2009 11:02 AM  Telephoned home, reached spouse. She will have pt call us when he returns home in approx . Bethena Midget, RN, BSN  December 23, 2009 12:46 PM  Continue taking 1 tablet on Monday and Friday and 1/2 tablet all  other days.  Recheck INR in 1 week.  Instructions given to patient by Bethena Midget, 12/23/09 @ 12:52 pm  Current Anticoagulation Instructions: INR 2.5  Spoke with pt's wife.  Continue same dose of 1/2 tablet every day except 1 tablet on Monday and Friday.  Recheck INR in 1 week.

## 2010-05-03 NOTE — Medication Information (Signed)
Summary: Coumadin Clinic  Anticoagulant Therapy  Managed by: Weston Brass, PharmD Referring MD: Arvilla Meres PCP: Jayme Cloud, MD Supervising MD: Juanda Chance MD, Bruce Indication 1: Atrial Fibrillation (ICD-427.31) Indication 2: Pending Ablation (10/26) Lab Used: [facility] Site: [facility] INR POC 2.5 INR RANGE 2 - 3  Dietary changes: no    Health status changes: no    Bleeding/hemorrhagic complications: no    Recent/future hospitalizations: no    Any changes in medication regimen? no    Recent/future dental: no  Any missed doses?: no       Is patient compliant with meds? yes      Comments: Patient is a self-tester, tests every week.    Allergies: No Known Drug Allergies  Anticoagulation Management History:      His anticoagulation is being managed by telephone today.  Positive risk factors for bleeding include an age of 72 years or older.  The bleeding index is 'intermediate risk'.  Negative CHADS2 values include Age > 72 years old.  The start date was 08/15/2005.  His last INR was 2.2 ratio.  Anticoagulation responsible provider: Juanda Chance MD, Smitty Cords.  INR POC: 2.5.    Anticoagulation Management Assessment/Plan:      The patient's current anticoagulation dose is Coumadin 2 mg tabs: Take as directed by coumadin clinic..  The target INR is 2 - 3.  The next INR is due 12/29/2009.  Anticoagulation instructions were given to patient.  Results were reviewed/authorized by Weston Brass, PharmD.  He was notified by [name] RN.         Prior Anticoagulation Instructions: INR 2.7    Current Anticoagulation Instructions: INR 2.5  LMOM for pt. Weston Brass PharmD  December 22, 2009 9:59 AM  [facility] to call for dosing. Bethena Midget, RN, BSN  December 22, 2009 2:18 PM Attempted to call pt with results.  LMOM TCB. [name] RN  December 23, 2009 11:02 AM  Telephoned home, reached spouse. She will have pt call us when he returns home in approx . Bethena Midget, RN, BSN   December 23, 2009 12:46 PM  Continue taking 1 tablet on Monday and Friday and 1/2 tablet all other days.  Recheck INR in 1 week.  Instructions given to patient by Bethena Midget, 12/23/09 @ 12:52 pm

## 2010-05-03 NOTE — Medication Information (Signed)
Summary: Coumadin Clinic  Anticoagulant Therapy  Managed by: Weston Brass, PharmD Referring MD: Arvilla Meres PCP: Jayme Cloud, MD Supervising MD: Juanda Chance MD, Jarissa Sheriff Indication 1: Atrial Fibrillation (ICD-427.31) Indication 2: Pending Ablation (10/26) Lab Used: [facility] Site: [facility] INR POC 2.3 INR RANGE 2 - 3  Dietary changes: no    Health status changes: no    Bleeding/hemorrhagic complications: no    Recent/future hospitalizations: no    Any changes in medication regimen? no    Recent/future dental: no  Any missed doses?: no       Is patient compliant with meds? yes       Allergies: No Known Drug Allergies  Anticoagulation Management History:      His anticoagulation is being managed by telephone today.  Positive risk factors for bleeding include an age of 72 years or older.  The bleeding index is 'intermediate risk'.  Negative CHADS2 values include Age > 72 years old.  The start date was 08/15/2005.  His last INR was 2.2 ratio.  Anticoagulation responsible provider: Juanda Chance MD, Smitty Cords.  INR POC: 2.3.  Exp: 12/2010.    Anticoagulation Management Assessment/Plan:      The patient's current anticoagulation dose is Coumadin 2 mg tabs: Take as directed by coumadin clinic..  The target INR is 2 - 3.  The next INR is due 11/02/2009.  Anticoagulation instructions were given to patient.  Results were reviewed/authorized by Weston Brass, PharmD.  He was notified by Weston Brass PharmD.         Prior Anticoagulation Instructions: INR 2.5  Continue on same dosage 1/2 tablet daily except 1 tablet on Mondays.  Recheck in 4 weeks.    Current Anticoagulation Instructions: INR 2.3  LMOM for pt. Weston Brass PharmD  October 26, 2009 1:58 PM   Spoke with pt's wife.  Continue same dose of 1mg  daily except 2mg  on Monday.  Recheck INR in 1 week. Pt is now self-testing

## 2010-05-03 NOTE — Medication Information (Signed)
Summary: Coumadin Clinic  Anticoagulant Therapy  Managed by: Bethena Midget, RN, BSN Referring MD: Arvilla Meres PCP: Jayme Cloud, MD Supervising MD: Ladona Ridgel MD, Sharlot Gowda Indication 1: Atrial Fibrillation (ICD-427.31) Indication 2: Pending Ablation (10/26) Lab Used: [facility] Site: [facility] INR POC 2.2 INR RANGE 2 - 3  Dietary changes: yes       Details: Ate altitle more leafy green veggies         Comments: Self-tester  Allergies: No Known Drug Allergies  Anticoagulation Management History:      His anticoagulation is being managed by telephone today.  Positive risk factors for bleeding include an age of 60 years or older.  The bleeding index is 'intermediate risk'.  Negative CHADS2 values include Age > 39 years old.  The start date was 08/15/2005.  His last INR was 2.2 ratio.  Anticoagulation responsible Montanna Mcbain: Ladona Ridgel MD, Sharlot Gowda.  INR POC: 2.2.    Anticoagulation Management Assessment/Plan:      The patient's current anticoagulation dose is Coumadin 2 mg tabs: Take as directed by coumadin clinic..  The target INR is 2 - 3.  The next INR is due 02/02/2010.  Anticoagulation instructions were given to patient.  Results were reviewed/authorized by Bethena Midget, RN, BSN.  He was notified by Bethena Midget, RN, BSN.         Prior Anticoagulation Instructions: INR 2.5 Continue 1mg s daily except 2mg s on M&F. Recheck in one week. Self-tester.   Current Anticoagulation Instructions: INR 2.2 Continue 1mg s daily except 2mg s on M&F. Recheck in one week, pt aware he is self-tester.

## 2010-05-03 NOTE — Medication Information (Signed)
Summary: rov/tm  Anticoagulant Therapy  Managed by: Weston Brass, PharmD Referring MD: Arvilla Meres PCP: Jayme Cloud, MD Supervising MD: Eden Emms MD, Theron Arista Indication 1: Atrial Fibrillation (ICD-427.31) Indication 2: Pending Ablation (10/26) [facility]  Site: [facility] INR POC 2.2 INR RANGE 2 - 3   Health status changes: no    Bleeding/hemorrhagic complications: no    Recent/future hospitalizations: no    Any changes in medication regimen? no    Recent/future dental: no  Any missed doses?: no       Is patient compliant with meds? yes       Current Medications (verified): 1)  Adult Aspirin Low Strength 81 Mg  Tbdp (Aspirin) .... Take 1 Po Qd 2)  Avapro 300 Mg  Tabs (Irbesartan) .... Take 1 By Mouth Qd 3)  Coumadin 2 Mg Tabs (Warfarin Sodium) .... Take As Directed By Coumadin Clinic. 4)  Norvasc 2.5 Mg Tabs (Amlodipine Besylate) .Marland Kitchen.. 1 Daily 5)  Vitamin D 2000 Unit Tabs (Cholecalciferol) .... Take One Tablet By Mouth Once Daily. 6)  B-12 1000 Mcg Cr-Tabs (Cyanocobalamin) .... Take One Tablet By Mouth Once Daily. 7)  Fish Oil   Oil (Fish Oil) .... Take One Tablet By Mouth Once Daily. 8)  Amiodarone Hcl 200 Mg Tabs (Amiodarone Hcl) .... Take 1 Tablet By Mouth Once A Day 9)  Lovastatin 20 Mg Tabs (Lovastatin) .... Take One Tablet By Mouth Daily At Bedtime  Allergies (verified): No Known Drug Allergies  Anticoagulation Management History:      The patient is taking warfarin and comes in today for a routine follow up visit.  Positive risk factors for bleeding include an age of 72 years or older.  The bleeding index is 'intermediate risk'.  Negative CHADS2 values include Age > 72 years old.  The start date was 08/15/2005.  His last INR was 2.2 ratio.  Anticoagulation responsible provider: Eden Emms MD, Theron Arista.  INR POC: 2.2.  Cuvette Lot#: [id number].  Exp: 11/2010.    Anticoagulation Management Assessment/Plan:      The patient's current anticoagulation dose is Coumadin 2  mg tabs: Take as directed by coumadin clinic..  The target INR is 2 - 3.  The next INR is due 09/28/2009.  Anticoagulation instructions were given to patient.  Results were reviewed/authorized by Weston Brass, PharmD.  He was notified by Weston Brass PharmD.         Prior Anticoagulation Instructions: INR 1.6 Today take 3mg  then resume 1mg s everyday except 2mg s on Mondays.   Current Anticoagulation Instructions: INR 2.2  Continue same dose of 1/2 tablet every day except 1 tablet on Monday.

## 2010-05-03 NOTE — Medication Information (Signed)
Summary: [facility]   Anticoagulant Therapy  Managed by: [name], RN, BSN Referring MD: Arvilla Meres PCP: Jayme Cloud, MD Supervising MD: Riley Kill MD, Maisie Fus Indication 1: Atrial Fibrillation (ICD-427.31) Indication 2: Pending Ablation (10/26) Lab Used: [facility] Site: [facility] INR POC 3.1 INR RANGE 2 - 3  Dietary changes: yes       Details: Ate less salads  Health status changes: no    Bleeding/hemorrhagic complications: no    Recent/future hospitalizations: no    Any changes in medication regimen? yes       Details: Took an extra dosage 1 day last week.   Recent/future dental: no  Any missed doses?: no       Is patient compliant with meds? yes       Allergies: No Known Drug Allergies  Anticoagulation Management History:      His anticoagulation is being managed by telephone today.  Positive risk factors for bleeding include an age of 72 years or older.  The bleeding index is 'intermediate risk'.  Negative CHADS2 values include Age > 72 years old.  The start date was 08/15/2005.  His last INR was 2.2 ratio.  Anticoagulation responsible provider: Riley Kill MD, Maisie Fus.  INR POC: 3.1.  Exp: 12/2010.    Anticoagulation Management Assessment/Plan:      The patient's current anticoagulation dose is Coumadin 2 mg tabs: Take as directed by coumadin clinic..  The target INR is 2 - 3.  The next INR is due 02/09/2010.  Anticoagulation instructions were given to patient.  Results were reviewed/authorized by [name], RN, BSN.  He was notified by [name] RN.         Prior Anticoagulation Instructions: INR 2.2 Continue 1mg s daily except 2mg s on M&F. Recheck in one week, pt aware he is self-tester.  Current Anticoagulation Instructions: INR 3.1  Pt is a self-tester. LMOM for pt. Weston Brass PharmD  February 02, 2010 2:48 PM  Spoke with pt, pt skipped last night's dosage and has resumed previous dosage.  Will recheck in 1 week.

## 2010-05-03 NOTE — Medication Information (Signed)
Summary: [facility]  Anticoagulant Therapy  Managed by: Bethena Midget, RN, BSN Referring MD: Arvilla Meres PCP: Jayme Cloud, MD Supervising MD: Clifton James MD, Cristal Deer Indication 1: Atrial Fibrillation (ICD-427.31) Indication 2: Pending Ablation (10/26) Lab Used: [facility]  Site: [facility] INR POC 2.4 INR RANGE 2 - 3  Dietary changes: no    Health status changes: no    Bleeding/hemorrhagic complications: no    Recent/future hospitalizations: no    Any changes in medication regimen? no    Recent/future dental: no  Any missed doses?: no       Is patient compliant with meds? yes      Comments: Self-tester  Allergies: No Known Drug Allergies  Anticoagulation Management History:      His anticoagulation is being managed by telephone today.  Positive risk factors for bleeding include an age of 72 years or older.  The bleeding index is 'intermediate risk'.  Negative CHADS2 values include Age > 72 years old.  The start date was 08/15/2005.  His last INR was 2.2 ratio.  Anticoagulation responsible provider: Clifton James MD, Cristal Deer.  INR POC: 2.4.    Anticoagulation Management Assessment/Plan:      The patient's current anticoagulation dose is Coumadin 2 mg tabs: Take as directed by coumadin clinic..  The target INR is 2 - 3.  The next INR is due 01/12/2010.  Anticoagulation instructions were given to patient.  Results were reviewed/authorized by Bethena Midget, RN, BSN.  He was notified by Bethena Midget, RN, BSN.         Prior Anticoagulation Instructions: INR 2.5  Spoke with pt's wife.  Continue same dose of 1/2 tablet every day except 1 tablet on Monday and Friday.  Recheck INR in 1 week.   Current Anticoagulation Instructions: INR 2.4 Continue 1mg  daily except 2mg s on Mondays and Fridays. Recheck in one week.  Self-tester.

## 2010-05-03 NOTE — Medication Information (Signed)
Summary: [facility]  Anticoagulant Therapy  Managed by: [name], RN, BSN Referring MD: Arvilla Meres PCP: Jayme Cloud, MD Supervising MD: Antoine Poche MD, Fayrene Fearing Indication 1: Atrial Fibrillation (ICD-427.31) Indication 2: Pending Ablation (10/26) Lab Used: [facility] Site: [facility] INR POC 2.3 INR RANGE 2 - 3    Bleeding/hemorrhagic complications: no     Any changes in medication regimen? no     Any missed doses?: no       Is patient compliant with meds? yes       Allergies: No Known Drug Allergies  Anticoagulation Management History:      His anticoagulation is being managed by telephone today.  Positive risk factors for bleeding include an age of 72 years or older.  The bleeding index is 'intermediate risk'.  Negative CHADS2 values include Age > 52 years old.  The start date was 08/15/2005.  His last INR was 2.2 ratio.  Anticoagulation responsible provider: Antoine Poche MD, Fayrene Fearing.  INR POC: 2.3.  Exp: 12/2010.    Anticoagulation Management Assessment/Plan:      The patient's current anticoagulation dose is Coumadin 2 mg tabs: Take as directed by coumadin clinic..  The target INR is 2 - 3.  The next INR is due 12/08/2009.  Anticoagulation instructions were given to patient.  Results were reviewed/authorized by [name], RN, BSN.  He was notified by [name] RN.         Prior Anticoagulation Instructions: INR 1.9  Pt is a self-tester.  Attempted to call pt with results.  LMOM TCB. [name] RN  November 19, 2009 1:34 PM   Spoke with pt.  Increase dose to 1/2 tablet every day except 1 tablet on Monday and Friday.  Recheck INR in 1 week.    Current Anticoagulation Instructions: INR 2.3  Pt is a self-tester.  Called spoke with pt advised to continue on same dosage 1/2 tablet daily except 1 tablet on Mondays and Fridays.  Recheck on 12/08/09.

## 2010-05-03 NOTE — Medication Information (Signed)
Summary: [facility] Clinic   Anticoagulant Therapy  Managed by: Weston Brass, PharmD Referring MD: Arvilla Meres PCP: Jayme Cloud, MD Supervising MD: Myrtis Ser MD, Tinnie Gens Indication 1: Atrial Fibrillation (ICD-427.31) Indication 2: Pending Ablation (10/26) Lab Used: [facility] Site: [facility] INR POC 2.3 INR RANGE 2 - 3  Dietary changes: no    Health status changes: no    Bleeding/hemorrhagic complications: no    Recent/future hospitalizations: no    Any changes in medication regimen? no    Recent/future dental: no  Any missed doses?: no       Is patient compliant with meds? yes       Allergies: No Known Drug Allergies  Anticoagulation Management History:      The patient is taking warfarin and comes in today for a routine follow up visit.  Positive risk factors for bleeding include an age of 72 years or older.  The bleeding index is 'intermediate risk'.  Negative CHADS2 values include Age > 72 years old.  The start date was 08/15/2005.  His last INR was 2.2 ratio.  Anticoagulation responsible Vantasia Pinkney: Myrtis Ser MD, Tinnie Gens.  INR POC: 2.3.  Exp: 12/2010.    Anticoagulation Management Assessment/Plan:      The patient's current anticoagulation dose is Coumadin 2 mg tabs: Take as directed by coumadin clinic..  The target INR is 2 - 3.  The next INR is due 03/02/2010.  Anticoagulation instructions were given to patient.  Results were reviewed/authorized by Weston Brass, PharmD.  He was notified by Weston Brass PharmD.         Prior Anticoagulation Instructions: INR 3.1  Spoke with pt.  Skip today's dose of Coumadin then resume same dose of 1/2 tablet every day except 1 tablet on Monday and Friday.  Recheck INR in 1 week.   Current Anticoagulation Instructions: INR 2.3  Spoke with pt.  Continue same dose of 1/2 tablet every day except 1 tablet on Monday and Friday.  Recheck INR in 1 week.

## 2010-05-03 NOTE — Medication Information (Signed)
Summary: rov/sp  Anticoagulant Therapy  Managed by: [name], RN, BSN Referring MD: Arvilla Meres PCP: Jayme Cloud, MD Supervising MD: Shirlee Latch MD, Dalton Indication 1: Atrial Fibrillation (ICD-427.31) Indication 2: Pending Ablation (10/26) Lab Used: [facility] Site: [facility] INR POC 2.5 INR RANGE 2 - 3  Dietary changes: no    Health status changes: no    Bleeding/hemorrhagic complications: no    Recent/future hospitalizations: no    Any changes in medication regimen? no    Recent/future dental: no  Any missed doses?: no       Is patient compliant with meds? yes       Allergies: No Known Drug Allergies  Anticoagulation Management History:      The patient is taking warfarin and comes in today for a routine follow up visit.  Positive risk factors for bleeding include an age of 72 years or older.  The bleeding index is 'intermediate risk'.  Negative CHADS2 values include Age > 45 years old.  The start date was 08/15/2005.  His last INR was 2.2 ratio.  Anticoagulation responsible provider: Shirlee Latch MD, Dalton.  INR POC: 2.5.  Cuvette Lot#: [id number].  Exp: 12/2010.    Anticoagulation Management Assessment/Plan:      The patient's current anticoagulation dose is Coumadin 2 mg tabs: Take as directed by coumadin clinic..  The target INR is 2 - 3.  The next INR is due 10/28/2009.  Anticoagulation instructions were given to patient.  Results were reviewed/authorized by [name], RN, BSN.  He was notified by [name] RN.         Prior Anticoagulation Instructions: INR 2.2  Continue same dose of 1/2 tablet every day except 1 tablet on Monday.   Current Anticoagulation Instructions: INR 2.5  Continue on same dosage 1/2 tablet daily except 1 tablet on Mondays.  Recheck in 4 weeks.

## 2010-05-03 NOTE — Medication Information (Signed)
Summary: [facility] Clinic   Anticoagulant Therapy  Managed by: Weston Brass, PharmD Referring MD: Arvilla Meres PCP: Jayme Cloud, MD Supervising MD: Myrtis Ser MD, Tinnie Gens Indication 1: Atrial Fibrillation (ICD-427.31) Indication 2: Pending Ablation (10/26) Lab Used: [facility] Site: [facility] INR POC 3.1 INR RANGE 2 - 3  Dietary changes: no    Health status changes: no    Bleeding/hemorrhagic complications: no    Recent/future hospitalizations: no    Any changes in medication regimen? yes       Details: taking IBU  Recent/future dental: no  Any missed doses?: no       Is patient compliant with meds? 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  Positive risk factors for bleeding include an age of 72 years or older.  The bleeding index is 'intermediate risk'.  Negative CHADS2 values include Age > 72 years old.  The start date was 08/15/2005.  His last INR was 2.2 ratio.  Anticoagulation responsible provider: Myrtis Ser MD, Tinnie Gens.  INR POC: 3.1.  Exp: 12/2010.    Anticoagulation Management Assessment/Plan:      The patient's current anticoagulation dose is Coumadin 2 mg tabs: Take as directed by coumadin clinic..  The target INR is 2 - 3.  The next INR is due 02/23/2010.  Anticoagulation instructions were given to patient.  Results were reviewed/authorized by Weston Brass, PharmD.  He was notified by Weston Brass PharmD.         Prior Anticoagulation Instructions: INR 2.1  LMOM for pt. Weston Brass PharmD  February 10, 2010 12:10 PM   Spoke with pt denies any changes. He is aware to recheck in one week. Pt is a Forensic psychologist.   Current Anticoagulation Instructions: INR 3.1  Spoke with pt.  Skip today's dose of Coumadin then resume same dose of 1/2 tablet every day except 1 tablet on Monday and Friday.  Recheck INR in 1 week.

## 2010-05-03 NOTE — Medication Information (Signed)
Summary: [facility] Clinic  Anticoagulant Therapy  Managed by: Weston Brass PharmD Referring MD: Arvilla Meres PCP: Jayme Cloud, MD Supervising MD: Tenny Craw MD, Gunnar Fusi Indication 1: Atrial Fibrillation (ICD-427.31) Indication 2: Pending Ablation (10/26) Lab Used: [facility] Site: [facility] INR POC 2.4 INR RANGE 2 - 3  Dietary changes: no    Health status changes: no    Bleeding/hemorrhagic complications: no    Recent/future hospitalizations: no    Any changes in medication regimen? yes       Details: off amiodarone x 1 month  Recent/future dental: no  Any missed doses?: no       Is patient compliant with meds? yes       Allergies: No Known Drug Allergies  Anticoagulation Management History:      His anticoagulation is being managed by telephone today.  Positive risk factors for bleeding include an age of 15 years or older.  The bleeding index is 'intermediate risk'.  Negative CHADS2 values include Age > 48 years old.  The start date was 08/15/2005.  His last INR was 2.2 ratio.  Anticoagulation responsible provider: Tenny Craw MD, Gunnar Fusi.  INR POC: 2.4.  Exp: 12/2010.    Anticoagulation Management Assessment/Plan:      The patient's current anticoagulation dose is Coumadin 2 mg tabs: Take as directed by coumadin clinic..  The target INR is 2 - 3.  The next INR is due 11/19/2009.  Anticoagulation instructions were given to patient.  Results were reviewed/authorized by Weston Brass PharmD.  He was notified by Weston Brass PharmD.         Prior Anticoagulation Instructions: INR 2.2 LMOM to call dosing. Bethena Midget, RN, BSN  November 05, 2009 3:24 PM  Spoke with pt.  Continue same dose of 1/2 tablet every day except 1 tablet on Monday.  Recheck INR in 1 week.    Current Anticoagulation Instructions: INR 2.4  Spoke with pt. Continue same dose of 1/2 tablet every day except 1 tablet on Monday.  Recheck INR in 1 week.

## 2010-05-03 NOTE — Medication Information (Signed)
Summary: [facility]in Clinic  Anticoagulant Therapy  Managed by: Antonio Carrillo, PharmD Referring MD: Antonio Carrillo PCP: Antonio Cloud, MD Supervising MD: Antonio Kill MD, Antonio Carrillo Indication 1: Atrial Fibrillation (ICD-427.31) Indication 2: Pending Ablation (10/26) Lab Used: [facility] Site: [facility] INR POC 1.9 INR RANGE 2 - 3  Dietary changes: no    Health status changes: no    Bleeding/hemorrhagic complications: no    Recent/future hospitalizations: no    Any changes in medication regimen? yes       Details: off amiodarone  Recent/future dental: no  Any missed doses?: no       Is patient compliant with meds? yes       Allergies: No Known Drug Allergies  Anticoagulation Management History:      His anticoagulation is being managed by telephone today.  Positive risk factors for bleeding include an age of 36 years or older.  The bleeding index is 'intermediate risk'.  Negative CHADS2 values include Age > 79 years old.  The start date was 08/15/2005.  His last INR was 2.2 ratio.  Anticoagulation responsible provider: Riley Kill MD, Antonio Carrillo.  INR POC: 1.9.  Exp: 12/2010.    Anticoagulation Management Assessment/Plan:      The patient's current anticoagulation dose is Coumadin 2 mg tabs: Take as directed by coumadin clinic..  The target INR is 2 - 3.  The next INR is due 11/26/2009.  Anticoagulation instructions were given to patient.  Results were reviewed/authorized by Antonio Carrillo, PharmD.  He was notified by Antonio Carrillo PharmD.         Prior Anticoagulation Instructions: INR 2.4  Spoke with pt. Continue same dose of 1/2 tablet every day except 1 tablet on Monday.  Recheck INR in 1 week.   Current Anticoagulation Instructions: INR 1.9  Pt is a self-tester.  Attempted to call pt with results.  LMOM TCB. Antonio Reams RN  November 19, 2009 1:34 PM   Spoke with pt.  Increase dose to 1/2 tablet every day except 1 tablet on Monday and Friday.  Recheck INR in 1 week.

## 2010-05-03 NOTE — Progress Notes (Signed)
Summary: pt wants to set up tee   Phone Note Call from Patient Call back at Home Phone [phone] Call back at [phone]   Caller: Patient Reason for Call: Talk to Nurse Summary of Call: pt wants to schedule tee.  Initial call taken by: Lorne Skeens,  July 28, 2009 8:52 AM  Follow-up for Phone Call        pt states he needs tee to f/u his aortic aneurysm, will discuss w/Dr Kel Senn and call pt back Meredith Staggers, RN  July 28, 2009 12:29 PM   Additional Follow-up for Phone Call Additional follow up Details #1::        Just needs a chest wall echo. I can speak with him, if needed. Dolores Patty, MD, [facility]  July 28, 2009 6:05 PM  pt is aware, ordered sent to [facility], RN  July 29, 2009 11:14 AM

## 2010-05-03 NOTE — Progress Notes (Signed)
Summary: set up for echo    Phone Note Call from Patient Call back at Home Phone [phone]   Caller: Patient Reason for Call: Talk to Nurse Details for Reason: pt would like to be set up for echo .  Initial call taken by: Lorne Skeens,  February 14, 2010 8:14 AM  Follow-up for Phone Call        The pt said he thought it had been a while since an echo was done to assess his aorta. I spoke with the pt and made him aware that his most recent echo was performed 08/23/09.  Per the pt he said he should be due again in May 2012. Follow-up by: Julieta Gutting, RN, BSN,  February 14, 2010 8:52 AM

## 2010-05-03 NOTE — Medication Information (Signed)
Summary: rov/ewj  Anticoagulant Therapy  Managed by: [name], RN, BSN Referring MD: Arvilla Meres PCP: Jayme Cloud, MD Supervising MD: Tenny Craw MD, Gunnar Fusi Indication 1: Atrial Fibrillation (ICD-427.31) Indication 2: Pending Ablation (10/26) Lab Used: [facility] Site: [facility] INR POC 2.3 INR RANGE 2 - 3  Dietary changes: yes       Details: Incr amt of vit K this past week.    Health status changes: no    Bleeding/hemorrhagic complications: no    Recent/future hospitalizations: no    Any changes in medication regimen? no    Recent/future dental: no  Any missed doses?: no       Is patient compliant with meds? yes       Allergies (verified): No Known Drug Allergies  Anticoagulation Management History:      The patient is taking warfarin and comes in today for a routine follow up visit.  Positive risk factors for bleeding include an age of 32 years or older.  The bleeding index is 'intermediate risk'.  Negative CHADS2 values include Age > 59 years old.  The start date was 08/15/2005.  His last INR was 2.2 ratio.  Anticoagulation responsible provider: Tenny Craw MD, Gunnar Fusi.  INR POC: 2.3.  Cuvette Lot#: [id number].  Exp: 08/2010.    Anticoagulation Management Assessment/Plan:      The patient's current anticoagulation dose is Coumadin 2 mg tabs: Take as directed by coumadin clinic..  The target INR is 2 - 3.  The next INR is due 07/01/2009.  Anticoagulation instructions were given to patient.  Results were reviewed/authorized by [name], RN, BSN.  He was notified by [name] RN.         Prior Anticoagulation Instructions: INR 3.2  Start taking 1/2 tablet daily except 1 tablet on Mondays.  Recheck in 4 weeks.    Current Anticoagulation Instructions: INR 2.3  Continue on same dosage 1/2 tablet daily except 1 tablet on Mondays.  Recheck in 4 weeks.

## 2010-05-03 NOTE — Medication Information (Signed)
Summary: Coumadin Clinic   Anticoagulant Therapy  Managed by: Bethena Midget, RN, BSN Referring MD: Arvilla Meres PCP: Jayme Cloud, MD Supervising MD: Eden Emms MD, Theron Arista Indication 1: Atrial Fibrillation (ICD-427.31) Indication 2: Pending Ablation (10/26) Lab Used: [facility] Site: [facility] INR POC 2.1 INR RANGE 2 - 3  Dietary changes: no    Health status changes: no    Bleeding/hemorrhagic complications: no    Recent/future hospitalizations: no    Any changes in medication regimen? no    Recent/future dental: no  Any missed doses?: no       Is patient compliant with meds? 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  Allergies: No Known Drug Allergies  Anticoagulation Management History:      His anticoagulation is being managed by telephone today.  Positive risk factors for bleeding include an age of 21 years or older.  The bleeding index is 'intermediate risk'.  Negative CHADS2 values include Age > 66 years old.  The start date was 08/15/2005.  His last INR was 2.2 ratio.  Anticoagulation responsible provider: Eden Emms MD, Theron Arista.  INR POC: 2.1.    Anticoagulation Management Assessment/Plan:      The patient's current anticoagulation dose is Coumadin 2 mg tabs: Take as directed by coumadin clinic..  The target INR is 2 - 3.  The next INR is due 02/16/2010.  Anticoagulation instructions were given to patient.  Results were reviewed/authorized by Bethena Midget, RN, BSN.  He was notified by Bethena Midget, RN, BSN.         Prior Anticoagulation Instructions: INR 3.1  Pt is a self-tester. LMOM for pt. Weston Brass PharmD  February 02, 2010 2:48 PM  Spoke with pt, pt skipped last night's dosage and has resumed previous dosage.  Will recheck in 1 week.   Current Anticoagulation Instructions: INR 2.1  LMOM for pt. Weston Brass PharmD  February 10, 2010 12:10 PM   Spoke with pt denies any changes. He is aware to recheck in one week. Pt is a Forensic psychologist.

## 2010-05-03 NOTE — Medication Information (Signed)
Summary: Coumadin Clinic  Anticoagulant Therapy  Managed by: Weston Brass PharmD Referring MD: Arvilla Meres PCP: Jayme Cloud, MD Supervising MD: Excell Seltzer MD, Casimiro Needle Indication 1: Atrial Fibrillation (ICD-427.31) Indication 2: Pending Ablation (10/26) Lab Used: [facility] Site: [facility] INR POC 2.2 INR RANGE 2 - 3  Dietary changes: no    Health status changes: no    Bleeding/hemorrhagic complications: no    Recent/future hospitalizations: no    Any changes in medication regimen? yes       Details: off amiodarone x 1 month now.  Recent/future dental: no  Any missed doses?: no       Is patient compliant with meds? yes       Allergies: No Known Drug Allergies  Anticoagulation Management History:      His anticoagulation is being managed by telephone today.  Positive risk factors for bleeding include an age of 72 years or older.  The bleeding index is 'intermediate risk'.  Negative CHADS2 values include Age > 72 years old.  The start date was 08/15/2005.  His last INR was 2.2 ratio.  Anticoagulation responsible Larnie Heart: Excell Seltzer MD, Casimiro Needle.  INR POC: 2.2.    Anticoagulation Management Assessment/Plan:      The patient's current anticoagulation dose is Coumadin 2 mg tabs: Take as directed by coumadin clinic..  The target INR is 2 - 3.  The next INR is due 11/12/2009.  Anticoagulation instructions were given to patient.  Results were reviewed/authorized by Weston Brass PharmD.  He was notified by Weston Brass PharmD.         Prior Anticoagulation Instructions: INR 2.3  LMOM for pt. Weston Brass PharmD  October 26, 2009 1:58 PM   Spoke with pt's wife.  Continue same dose of 1mg  daily except 2mg  on Monday.  Recheck INR in 1 week. Pt is now self-testing  Current Anticoagulation Instructions: INR 2.2 LMOM to call dosing. Bethena Midget, RN, BSN  November 05, 2009 3:24 PM  Spoke with pt.  Continue same dose of 1/2 tablet every day except 1 tablet on Monday.  Recheck INR in 1 week.

## 2010-05-03 NOTE — Letter (Signed)
Summary: Appointment - Reschedule  [facility], Main Office  [address]   [place], [place] [address]   Phone: [phone]  Fax: [phone]     September 08, 2009 MRN: [id number]   Antonio Carrillo [address] [place], [place]  [address]   Dear Antonio Carrillo,   Due to a change in our office schedule, your appointment on 09/13/09 at 11:45 must be changed.  It is very important that we reach you to reschedule this appointment. We look forward to participating in your health care needs. Please contact us at the number listed above at your earliest convenience to reschedule this appointment.     Sincerely, [facility] Scheduling Team

## 2010-05-03 NOTE — Medication Information (Signed)
Summary: Coumadin Clinic  Anticoagulant Therapy  Managed by: Bethena Midget, RN, BSN Referring MD: Arvilla Meres PCP: Jayme Cloud, MD Supervising MD: Gala Romney MD, Reuel Boom Indication 1: Atrial Fibrillation (ICD-427.31) Indication 2: Pending Ablation (10/26) Lab Used: [facility] [place] Site: [address] INR POC 2.5 INR RANGE 2 - 3  Dietary changes: no    Health status changes: no    Bleeding/hemorrhagic complications: no    Recent/future hospitalizations: no    Any changes in medication regimen? no    Recent/future dental: no  Any missed doses?: no       Is patient compliant with meds? yes       Allergies: No Known Drug Allergies  Anticoagulation Management History:      His anticoagulation is being managed by telephone today.  Positive risk factors for bleeding include an age of 18 years or older.  The bleeding index is 'intermediate risk'.  Negative CHADS2 values include Age > 36 years old.  The start date was 08/15/2005.  His last INR was 2.2 ratio.  Anticoagulation responsible provider: Bensimhon MD, Reuel Boom.  INR POC: 2.5.    Anticoagulation Management Assessment/Plan:      The patient's current anticoagulation dose is Coumadin 2 mg tabs: Take as directed by coumadin clinic..  The target INR is 2 - 3.  The next INR is due 03/09/2010.  Anticoagulation instructions were given to patient.  Results were reviewed/authorized by Bethena Midget, RN, BSN.  He was notified by Bethena Midget, RN, BSN.        Coagulation management information includes: Cell # [phone].  Prior Anticoagulation Instructions: INR 2.3  Spoke with pt.  Continue same dose of 1/2 tablet every day except 1 tablet on Monday and Friday.  Recheck INR in 1 week.   Current Anticoagulation Instructions: INR 2.5 LMOM to call for dosing. Bethena Midget, RN, BSN  March 02, 2010 4:16 PM Spoke with wife, pt is asleep will have him return call. Bethena Midget, RN, BSN  March 03, 2010 1:49 PM  [facility] for pt to  call for dosing. Bethena Midget, RN, BSN  March 04, 2010 9:25 AM  Spoke with pt.  Continue same dose.  Recheck INR in 1 week. Weston Brass PharmD  March 04, 2010 12:31 PM

## 2010-05-04 ENCOUNTER — Encounter: Payer: Self-pay | Admitting: Internal Medicine

## 2010-05-04 LAB — CONVERTED CEMR LAB: POC INR: 2.5

## 2010-05-05 ENCOUNTER — Encounter: Payer: Self-pay | Admitting: Internal Medicine

## 2010-05-05 NOTE — Medication Information (Signed)
Summary: Coumadin Clinic   Anticoagulant Therapy  Managed by: Weston Brass, PharmD Referring MD: Arvilla Meres PCP: Jayme Cloud, MD Supervising MD: Juanda Chance MD, Bruce Indication 1: Atrial Fibrillation (ICD-427.31) Indication 2: Pending Ablation (10/26) Lab Used: [facility] [place] Site: [address] INR POC 2.6 INR RANGE 2 - 3  Dietary changes: no    Health status changes: no    Bleeding/hemorrhagic complications: no    Recent/future hospitalizations: no    Any changes in medication regimen? no    Recent/future dental: no  Any missed doses?: no       Is patient compliant with meds? yes       Allergies: No Known Drug Allergies  Anticoagulation Management History:      His anticoagulation is being managed by telephone today.  Positive risk factors for bleeding include an age of 72 years or older.  The bleeding index is 'intermediate risk'.  Negative CHADS2 values include Age > 72 years old.  The start date was 08/15/2005.  His last INR was 2.2 ratio.  Anticoagulation responsible provider: Juanda Chance MD, Smitty Cords.  INR POC: 2.6.  Exp: 12/2010.    Anticoagulation Management Assessment/Plan:      The patient's current anticoagulation dose is Coumadin 2 mg tabs: Take as directed by coumadin clinic..  The target INR is 2 - 3.  The next INR is due 03/23/2010.  Anticoagulation instructions were given to patient.  Results were reviewed/authorized by Weston Brass, PharmD.  He was notified by Weston Brass PharmD.         Prior Anticoagulation Instructions: INR 2.5 LMOM to call for dosing. Bethena Midget, RN, BSN  March 02, 2010 4:16 PM Spoke with wife, pt is asleep will have him return call. Bethena Midget, RN, BSN  March 03, 2010 1:49 PM  [facility] for pt to call for dosing. Bethena Midget, RN, BSN  March 04, 2010 9:25 AM  Spoke with pt.  Continue same dose.  Recheck INR in 1 week. Weston Brass PharmD  March 04, 2010 12:31 PM    Current Anticoagulation Instructions: INR 2.6  Spoke with  pt.  Continue same dose of 1/2 tablet every day except 1 tablet on Monday and Friday.  Recheck INR in 1 week.

## 2010-05-05 NOTE — Medication Information (Signed)
Summary: Coumadin Clinic   Anticoagulant Therapy  Managed by: Weston Brass, PharmD Referring MD: Arvilla Meres PCP: Jayme Cloud, MD Supervising MD: Antoine Poche MD, Fayrene Fearing Indication 1: Atrial Fibrillation (ICD-427.31) Indication 2: Pending Ablation (10/26) Lab Used: [facility] [place] Site: [address] INR POC 2.8 INR RANGE 2 - 3  Dietary changes: yes       Details: ate a few extra greens  Health status changes: no    Bleeding/hemorrhagic complications: no    Recent/future hospitalizations: no    Any changes in medication regimen? no    Recent/future dental: no  Any missed doses?: no       Is patient compliant with meds? yes       Allergies: No Known Drug Allergies  Anticoagulation Management History:      His anticoagulation is being managed by telephone today.  Positive risk factors for bleeding include an age of 72 years or older.  The bleeding index is 'intermediate risk'.  Negative CHADS2 values include Age > 72 years old.  The start date was 08/15/2005.  His last INR was 3.2 ratio.  Anticoagulation responsible provider: Antoine Poche MD, Fayrene Fearing.  INR POC: 2.8.  Exp: 12/2010.    Anticoagulation Management Assessment/Plan:      The patient's current anticoagulation dose is Coumadin 2 mg tabs: Take as directed by coumadin clinic..  The target INR is 2 - 3.  The next INR is due 04/13/2010.  Anticoagulation instructions were given to patient.  Results were reviewed/authorized by Weston Brass, PharmD.  He was notified by Weston Brass PharmD.         Prior Anticoagulation Instructions: INR 3.1 Change 1mg s daily. Recheck in one week.   Current Anticoagulation Instructions: INR 2.8  Spoke with pt.  Continue same dose of 1/2 tablet every day.  Recheck INR in 1 week.

## 2010-05-05 NOTE — Medication Information (Signed)
Summary: Coumadin Clinic   Anticoagulant Therapy  Managed by: Weston Brass, PharmD Referring MD: Arvilla Meres PCP: Jayme Cloud, MD Supervising MD: Johney Frame MD, Fayrene Fearing Indication 1: Atrial Fibrillation (ICD-427.31) Indication 2: Pending Ablation (10/26) Lab Used: [facility] [place] Site: [address] INR POC 2.7 INR RANGE 2 - 3  Dietary changes: no    Health status changes: no    Bleeding/hemorrhagic complications: no    Recent/future hospitalizations: no    Any changes in medication regimen? no    Recent/future dental: no  Any missed doses?: no       Is patient compliant with meds? yes       Allergies: No Known Drug Allergies  Anticoagulation Management History:      His anticoagulation is being managed by telephone today.  Positive risk factors for bleeding include an age of 42 years or older.  The bleeding index is 'intermediate risk'.  Negative CHADS2 values include Age > 57 years old.  The start date was 08/15/2005.  His last INR was 3.2 ratio.  Anticoagulation responsible provider: Azazel Franze MD, Fayrene Fearing.  INR POC: 2.7.  Exp: 12/2010.    Anticoagulation Management Assessment/Plan:      The patient's current anticoagulation dose is Coumadin 2 mg tabs: Take as directed by coumadin clinic..  The target INR is 2 - 3.  The next INR is due 04/20/2010.  Anticoagulation instructions were given to patient.  Results were reviewed/authorized by Weston Brass, PharmD.  He was notified by Weston Brass PharmD.         Prior Anticoagulation Instructions: INR 2.8  Spoke with pt.  Continue same dose of 1/2 tablet every day.  Recheck INR in 1 week.   Current Anticoagulation Instructions: INR 2.7  Spoke with pt.  Continue same dose of 1/2 tablet every day.  Recheck INR in 1 week.

## 2010-05-05 NOTE — Medication Information (Signed)
Summary: Coumadin Clinic  Anticoagulant Therapy  Managed by: Bethena Midget, RN, BSN Referring MD: Arvilla Meres PCP: Jayme Cloud, MD Supervising MD: Shirlee Latch MD, Dalton Indication 1: Atrial Fibrillation (ICD-427.31) Indication 2: Pending Ablation (10/26) Lab Used: [facility] [place] Site: [address] INR POC 2.0 INR RANGE 2 - 3  Dietary changes: no    Health status changes: no    Bleeding/hemorrhagic complications: no    Recent/future hospitalizations: no    Any changes in medication regimen? yes       Details: Amiordarone increased to 800mg s daily on 12/16 post DCCV  Recent/future dental: no  Any missed doses?: no       Is patient compliant with meds? yes       Allergies: No Known Drug Allergies  Anticoagulation Management History:      His anticoagulation is being managed by telephone today.  Positive risk factors for bleeding include an age of 72 years or older.  The bleeding index is 'intermediate risk'.  Negative CHADS2 values include Age > 70 years old.  The start date was 08/15/2005.  His last INR was 3.2 ratio.  Anticoagulation responsible provider: Shirlee Latch MD, Dalton.  INR POC: 2.0.    Anticoagulation Management Assessment/Plan:      The patient's current anticoagulation dose is Coumadin 2 mg tabs: Take as directed by coumadin clinic..  The target INR is 2 - 3.  The next INR is due 03/30/2010.  Anticoagulation instructions were given to patient.  Results were reviewed/authorized by Bethena Midget, RN, BSN.  He was notified by Bethena Midget, RN, BSN.         Prior Anticoagulation Instructions: INR 2.6  Spoke with pt.  Continue same dose of 1/2 tablet every day except 1 tablet on Monday and Friday.  Recheck INR in 1 week.   Current Anticoagulation Instructions: INR 2.0 Continue 1mg s daily except 2mg s on Mondays and Fridays. Recheck in on week.

## 2010-05-05 NOTE — Letter (Signed)
Summary: Cardioversion/TEE Instructions  Architectural technologist, Main Office  [address]   [place], [place] [address]   Phone: [phone]  Fax: [phone]    Cardioversion / TEE Cardioversion Instructions  03/18/2010 MRN: [id number]  Antonio Carrillo [address] [place], [place]  [address]  Dear [name], You are scheduled for a Cardioversion /  today_ with Dr.  Bensimhon_________________________________________.   Please arrive at the [facility] at _____________ a.m. / p.m. on the day of your procedure. We will call you later today with time.  1)   DIET:  A)   Nothing to eat or drink after midnight except your medications with a sip of water.   2)   Come to the [facility] office on (done today)____________________ for lab work. The lab at [facility] is open from 8:30 a.m. to 1:30 p.m. and 2:30 p.m. to 5:00 p.m. The lab at [facility] is open from 7:30 a.m. to 5:30 p.m. You do not have to be fasting.  3)   MAKE SURE YOU TAKE YOUR COUMADIN.  4)     YOU MAY TAKE ALL of your remaining medications with a small amount of water.    5)  Must have a responsible person to drive you home.  6)   Bring a current list of your medications and current insurance cards.   * Special Note:  Every effort is made to have your procedure done on time. Occasionally there are emergencies that present themselves at the hospital that may cause delays. Please be patient if a delay does occur.  * If you have any questions after you get home, please call the office at [phone].

## 2010-05-05 NOTE — Assessment & Plan Note (Signed)
Summary: per check out/sf  Medications Added LOSARTAN POTASSIUM 100 MG TABS (LOSARTAN POTASSIUM) take one by mouth daily LEXAPRO 20 MG TABS (ESCITALOPRAM OXALATE) 1 by mouth daily AMIODARONE HCL 400 MG TABS (AMIODARONE HCL) Take one tablet by mouth daily AMIODARONE HCL 200 MG TABS (AMIODARONE HCL) Take one tablet by mouth two times a day AMIODARONE HCL 400 MG TABS (AMIODARONE HCL) take one tablet by mouth two times a day      Allergies Added: NKDA  Referring Raena Pau:  Nicholes Mango, MD Primary Antonio Carrillo:  Jayme Cloud, MD   History of Present Illness: Antonio Carrillo is a pleasant 70 yo WM with a history of recurrent atrial fibrillation, HTN and HL.  He was diagnosed with atrial fibrillation in 1977 after presenting with symptoms of fatigue and decreased exercise tolerance.  He reports increasing frequency and duration of atrial fibrillation since that time.  He failed medical therapy with flecainide, rhythmol and most recently multaq.  He reports that he was not tried on [place] or sotalol due to prolonged QT.  He underwent atrial flutter ablation in [place] late 1990s.  Atrial fibrillation ablation was performed at [facility] 02/01/2006 and then repeat ablation was done by Dr. Johney Frame in 01/2010.  Was on amiodarone 100 once daily until July. Went back to AF in October. Feels weak and washed out. No CP or palpitations. No edema, orrthopnea or PND.   Current Medications (verified): 1)  Adult Aspirin Low Strength 81 Mg  Tbdp (Aspirin) .... Take 1 Po Qd 2)  Avapro 300 Mg  Tabs (Irbesartan) .... Take 1 By Mouth Qd 3)  Coumadin 2 Mg Tabs (Warfarin Sodium) .... Take As Directed By Coumadin Clinic. 4)  Norvasc 2.5 Mg Tabs (Amlodipine Besylate) .Marland Kitchen.. 1 Daily 5)  Vitamin D 2000 Unit Tabs (Cholecalciferol) .... Take One Tablet By Mouth Once Daily. 6)  B-12 1000 Mcg Cr-Tabs (Cyanocobalamin) .... Take One Tablet By Mouth Once Daily. 7)  Fish Oil   Oil (Fish Oil) .... Take One Tablet By Mouth Once Daily. 8)   Lovastatin 20 Mg Tabs (Lovastatin) .... Take One Tablet By Mouth Daily At Bedtime 9)  Lexapro 20 Mg Tabs (Escitalopram Oxalate) .Marland Kitchen.. 1 By Mouth Daily  Allergies (verified): No Known Drug Allergies  Past History:  Past Medical History: Last updated: 12/28/2008 Anticoagulation therapy Atrial fibrillation/flutter    --s/p a flutter ablation (CTI) in [place], late 1990s    --s/p a fib ablation at [facility] 02/01/2006 Hyperthyroidism Hyperthyroidism- resolved per pt Testicular Failure Aortic aneuryms (4.7cm) stable in size  Review of Systems       As per HPI and past medical history; otherwise all systems negative.   Vital Signs:  Patient profile:   70 year old male Height:      75 inches Weight:      260 pounds BMI:     32.62 Pulse rate:   129 / minute Resp:     16 per minute BP sitting:   138 / 76  (right arm)  Vitals Entered By: Antonio Coy, CNA (March 18, 2010 8:14 AM)  Physical Exam  General:  Gen: well appearing. no resp difficulty HEENT: normal Neck: supple. no JVD. Carotids 2+ bilat; no bruits. No lymphadenopathy or thryomegaly appreciated. Cor: PMI nondisplaced. tachy and irregular. No rubs, gallops, murmur. Lungs: clear Abdomen: soft, nontender, nondistended. No hepatosplenomegaly. No bruits or masses. Good bowel sounds. Extremities: no cyanosis, clubbing, rash, edema Neuro: alert & orientedx3, cranial nerves grossly intact. moves all 4 extremities w/o difficulty. affect pleasant  Impression & Recommendations:  Problem # 1:  ATRIAL FIBRILLATION (ICD-427.31) AF is recurrent s/p abaltions. Now with symptomatic RVR. Monitors INR at home and all hav ebeen therapeutic. Will start amio 400 two times a day today (wean eventualy to 100qd) and plan DC-CV this afternoon. Will d/w Dr. Johney Frame about possibility of "tocuh-up" procedure. Watch INRs closely on amio.  Problem # 2:  Hypertension Well controlled. Will switch Avapro to Losartan due to cost.   Other  Orders: EKG w/ Interpretation (93000) TLB-BMP (Basic Metabolic Panel-BMET) (80048-METABOL) TLB-CBC Platelet - w/Differential (85025-CBCD) TLB-PT (Protime) (85610-PTP)  Patient Instructions: 1)  Your physician recommends that you schedule a follow-up appointment in: 2 weeks 2)  Your physician has recommended you make the following change in your medication: Stop Avapro. Start Cozaar 100 mg by mouth daily.  Start amiodarone 400 mg by mouth two times a day 3)  Your physician has recommended that you have a cardioversion (DCCV).  Electrical cardioversion uses a jolt of electricity to your heart either through paddles or wired patches attached to your chest. This is a controlled, usually prescheduled, procedure. Defibrillation is done under light anesthesia in the hospital, and you usually go home the day of the procedure. This is done to get your heart back into a normal rhythm. You are not awake for the procedure. Please see the instruction sheet given to you today. We will call you later today with time.   Prescriptions: LOVASTATIN 20 MG TABS (LOVASTATIN) Take one tablet by mouth daily at bedtime  #30 Each x 6   Entered by:   Dossie Arbour, RN, BSN   Authorized by:   Dolores Patty, MD, [facility]   Signed by:   Dossie Arbour, RN, BSN on 03/18/2010   Method used:   Electronically to        [facility]* (retail)       [address] [place], [place]  [address]       Ph: [phone]       Fax: [phone]   RxID:   [id number] NORVASC 2.5 MG TABS (AMLODIPINE BESYLATE) 1 daily  #90 Each x 3   Entered by:   Dossie Arbour, RN, BSN   Authorized by:   Dolores Patty, MD, [facility]   Signed by:   Dossie Arbour, RN, BSN on 03/18/2010   Method used:   Electronically to        [facility]* (retail)       [address] [place], [place]  [address]       Ph: [phone]       Fax: [phone]   RxID:   [id number] AMIODARONE HCL 400 MG TABS (AMIODARONE  HCL) take one tablet by mouth two times a day  #60 x 6   Entered by:   Dossie Arbour, RN, BSN   Authorized by:   Dolores Patty, MD, [facility]   Signed by:   Dossie Arbour, RN, BSN on 03/18/2010   Method used:   Electronically to        [facility]* (retail)       [address] [place], [place]  [address]       Ph: [phone]       Fax: [phone]   RxID:   [id number] AMIODARONE HCL 200 MG TABS (AMIODARONE HCL) Take one  tablet by mouth two times a day  #60 x 6   Entered by:   Dossie Arbour, RN, BSN   Authorized by:   Dolores Patty, MD, [facility]   Signed by:   Dossie Arbour, RN, BSN on 03/18/2010   Method used:   Electronically to        [facility]* (retail)       [address] [place], [place]  [address]       Ph: [phone]       Fax: [phone]   RxID:   [id number] AMIODARONE HCL 400 MG TABS (AMIODARONE HCL) Take one tablet by mouth daily  #60 x 6   Entered by:   Dossie Arbour, RN, BSN   Authorized by:   Dolores Patty, MD, [facility]   Signed by:   Dossie Arbour, RN, BSN on 03/18/2010   Method used:   Electronically to        [facility]* (retail)       [address] [place], [place]  [address]       Ph: [phone]       Fax: [phone]   RxID:   [id number] LOSARTAN POTASSIUM 100 MG TABS (LOSARTAN POTASSIUM) take one by mouth daily  #30 x 6   Entered by:   Dossie Arbour, RN, BSN   Authorized by:   Dolores Patty, MD, [facility]   Signed by:   Dossie Arbour, RN, BSN on 03/18/2010   Method used:   Electronically to        [facility]* (retail)       [address] [place], [place]  [address]       Ph: [phone]       Fax: [phone]   RxID:   [id number]   Appended Document: per check out/sf Correct dose of Amiodarone is 400mg  by mouth two times a day Pharmcy notified.  Pt aware and his instruction sheet has correct instructions.   Clinical Lists  Changes

## 2010-05-05 NOTE — Medication Information (Signed)
Summary: Coumadin Clinic   Anticoagulant Therapy  Managed by: Weston Brass, PharmD Referring MD: Arvilla Meres PCP: Jayme Cloud, MD Supervising MD: Eden Emms MD, Theron Arista Indication 1: Atrial Fibrillation (ICD-427.31) Indication 2: Pending Ablation (10/26) Lab Used: [facility] [place] Site: [address] INR POC 2.4 INR RANGE 2 - 3  Dietary changes: no    Health status changes: no    Bleeding/hemorrhagic complications: no    Recent/future hospitalizations: no    Any changes in medication regimen? no    Recent/future dental: no  Any missed doses?: no       Is patient compliant with meds? yes       Allergies: No Known Drug Allergies  Anticoagulation Management History:      His anticoagulation is being managed by telephone today.  Positive risk factors for bleeding include an age of 72 years or older.  The bleeding index is 'intermediate risk'.  Negative CHADS2 values include Age > 72 years old.  The start date was 08/15/2005.  His last INR was 3.2 ratio.  Anticoagulation responsible provider: Eden Emms MD, Theron Arista.  INR POC: 2.4.  Exp: 12/2010.    Anticoagulation Management Assessment/Plan:      The patient's current anticoagulation dose is Warfarin sodium 1 mg tabs: Use as directed by Anticoagualtion Clinic.  The target INR is 2 - 3.  The next INR is due 05/04/2010.  Anticoagulation instructions were given to patient.  Results were reviewed/authorized by Weston Brass, PharmD.  He was notified by Weston Brass PharmD.         Prior Anticoagulation Instructions: INR 2.1  LMOM for pt to continue with same dose of 1 mg daily. Will calll if any issues.  Recheck INR in 1 wek.   Current Anticoagulation Instructions: INR 2.4  Spoke with pt's wife.  Continue same dose of 1/2 tablet every day.  Recheck INR in 1 week.

## 2010-05-05 NOTE — Medication Information (Signed)
Summary: Coumadin Clinic  Anticoagulant Therapy  Managed by: Bethena Midget, RN, BSN Referring MD: Arvilla Meres PCP: Jayme Cloud, MD Supervising MD: Riley Kill MD, Maisie Fus Indication 1: Atrial Fibrillation (ICD-427.31) Indication 2: Pending Ablation (10/26) Lab Used: [facility] [place] Site: [address] INR POC 3.1 INR RANGE 2 - 3  Dietary changes: no       Details: Eating   Health status changes: no    Bleeding/hemorrhagic complications: no    Recent/future hospitalizations: no    Any changes in medication regimen? no    Recent/future dental: no  Any missed doses?: no       Is patient compliant with meds? yes      Comments: Remains on Amiodarone 800mg s daily for another 2 weeks.   Allergies: No Known Drug Allergies  Anticoagulation Management History:      His anticoagulation is being managed by telephone today.  Positive risk factors for bleeding include an age of 72 years or older.  The bleeding index is 'intermediate risk'.  Negative CHADS2 values include Age > 71 years old.  The start date was 08/15/2005.  His last INR was 3.2 ratio.  Anticoagulation responsible provider: Riley Kill MD, Maisie Fus.  INR POC: 3.1.    Anticoagulation Management Assessment/Plan:      The patient's current anticoagulation dose is Coumadin 2 mg tabs: Take as directed by coumadin clinic..  The target INR is 2 - 3.  The next INR is due 04/06/2010.  Anticoagulation instructions were given to patient.  Results were reviewed/authorized by Bethena Midget, RN, BSN.  He was notified by Bethena Midget, RN, BSN.         Prior Anticoagulation Instructions: INR 2.0 Continue 1mg s daily except 2mg s on Mondays and Fridays. Recheck in on week.   Current Anticoagulation Instructions: INR 3.1 Change 1mg s daily. Recheck in one week.

## 2010-05-05 NOTE — Assessment & Plan Note (Signed)
Summary: eph/jml  Medications Added WARFARIN SODIUM 1 MG TABS (WARFARIN SODIUM) Use as directed by Anticoagualtion Clinic AMIODARONE HCL 400 MG TABS (AMIODARONE HCL) take 1/2 tablet by mouth daily      Allergies Added: NKDA  Referring Provider:  Nicholes Mango, MD Primary Provider:  Jayme Cloud, MD   History of Present Illness: The patient presents today for routine electrophysiology followup. He underwent afib ablation by me 10/10 (a redo- first procedure at [facility] 2007).  He did very well, without afib recurrence for a year on Amiodarone 100mg  daily.  He stopped amiodarone 7/11 and reports developing recurrent symptomatic afib 10/11.  He developed symptoms of fatigue and decreased exercise tolerance.  He was evaluated by Dr Gala Romney and underwent cardioversion 12/11.  He has maintained sinus rhythm with amiodarone since that time.  Presently, he feels good.  He is without complaint today.  Current Medications (verified): 1)  Adult Aspirin Low Strength 81 Mg  Tbdp (Aspirin) .... Take 1 Po Qd 2)  Losartan Potassium 100 Mg Tabs (Losartan Potassium) .... Take One By Mouth Daily 3)  Warfarin Sodium 1 Mg Tabs (Warfarin Sodium) .... Use As Directed By Anticoagualtion Clinic 4)  Norvasc 2.5 Mg Tabs (Amlodipine Besylate) .Marland Kitchen.. 1 Daily 5)  Vitamin D 2000 Unit Tabs (Cholecalciferol) .... Take One Tablet By Mouth Once Daily. 6)  B-12 1000 Mcg Cr-Tabs (Cyanocobalamin) .... Take One Tablet By Mouth Once Daily. 7)  Fish Oil   Oil (Fish Oil) .... Take One Tablet By Mouth Once Daily. 8)  Lovastatin 20 Mg Tabs (Lovastatin) .... Take One Tablet By Mouth Daily At Bedtime 9)  Lexapro 20 Mg Tabs (Escitalopram Oxalate) .Marland Kitchen.. 1 By Mouth Daily 10)  Amiodarone Hcl 400 Mg Tabs (Amiodarone Hcl) .... Take One Tablet By Mouth Two Times A Day  Allergies (verified): No Known Drug Allergies  Past History:  Past Medical History: Anticoagulation therapy Atrial fibrillation/flutter    --s/p [place] flutter [place] (CTI[place], late 1990s    --s/p a fib ablation at [facility] 02/01/2006    --s/p redo afib ablation by [facility] 10/10 Hyperthyroidism Hyperthyroidism- resolved per pt Testicular Failure Aortic aneuryms (4.7cm) stable in size  Past Surgical History: Stress Cardiolite (01/29/2006) Transthoracic Echocardiogram (02/08/2006) Laminectomyx 2 s/p APPY bilateral TKA CTI ablation 1990s ([place]) Afib ablation 2007 ([facility]) Redo afib/ atypical flutter ablation by Fawn [facility]rc[facility] 10/10)  Social History: Reviewed history from 12/28/2008 and no changes required.  He is married and lives in [place].  He does not smoke.  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      Review of Systems       All systems are reviewed and negative except as listed in the HPI.   Vital Signs:  Patient profile:   70 year old male Height:      75 inches Weight:      259 pounds BMI:     32.49 Pulse rate:   67 / minute BP sitting:   142 / 100  (left arm)  Vitals Entered By: Laurance Flatten CMA (April 18, 2010 3:45 PM)  Physical Exam  General:  Well developed, well nourished, in no acute distress. Head:  normocephalic and atraumatic Eyes:  PERRLA/EOM intact; conjunctiva and lids normal. Mouth:  Teeth, gums and palate normal. Oral mucosa normal. Neck:  supple Lungs:  Clear bilaterally to auscultation and percussion. Heart:  RRR, no m/r/g Abdomen:  Bowel sounds positive; abdomen soft and non-tender without masses, organomegaly, or hernias noted. No hepatosplenomegaly. Msk:  Back  normal, normal gait. Muscle strength and tone normal. Extremities:  No clubbing or cyanosis. Neurologic:  Alert and oriented x 3. Skin:  Intact without lesions or rashes.    Echocardiogram  Procedure date:  08/23/2009  Findings:       - Left ventricle: LV size is increased, but there is good function.     The E/A ratio is increased. E/E' ratio is not elevated. This makes     assessment of diastolic function difficult. The cavity size was      moderately dilated. Wall thickness was normal. The estimated     ejection fraction was 65%. Wall motion was normal; there were no     regional wall motion abnormalities.   - Aorta: The root dilates from 40mm at the root to 46mm at     sino-tubular junction to 49mm in ascending aorta.   - Mitral valve: Mild regurgitation.   - Left atrium: The atrium was moderately dilated.   - Right atrium: The atrium was mildly dilated.   - Tricuspid valve: Mild regurgitation.      EKG  Procedure date:  04/18/2010  Findings:      sinus rhythm 67 bpm PR 220, RBBB  Impression & Recommendations:  Problem # 1:  ATRIAL FIBRILLATION (ICD-427.31) The patient report doing well for a year s/p afib ablation with low dose amiodarone.  He did have afib recurrence off of amiodarone 10/11.  At this point, he is taking amiodarone 400mg  daily.  I would recommend that we continue medical therapy at this time as this is his first recurrence.  Given his moderate LA enlargement and multiple prior atrial arrhythmias requiring multiple ablations, I think that he will require amiodarone to maintain sinus rhythm longterm. He will continue amiodarone 400mg  daily x 2 more weeks, then decrease to 200mg  daily.  He should continue coumadin (goal INR 2-3).  We will consider decreasing amiodarone back to 100mg  daily if he maintains sinus rhythm for more than 6 months.  We will check LFTs and TFTs today.  Problem # 2:  ANTICOAGULATION THERAPY (ICD-V58.61) goal INR 2-3  Other Orders: TLB-TSH (Thyroid Stimulating Hormone) (84443-TSH) TLB-T4 (Thyrox), Free [phone]) TLB-Hepatic/Liver Function Pnl (80076-HEPATIC)   Patient Instructions: 1)  Your physician wants you to follow-up in:   3 months with Dr Johney Frame Bonita Quin will receive a reminder letter in the mail two months in advance. If you don't receive a letter, please call our office to schedule the follow-up appointment. 2)  Your physician has recommended you make the following  change in your medication: decrease Amiodarone to 200mg  daily in 2 weeks

## 2010-05-05 NOTE — Medication Information (Signed)
Summary: Coumadin Clinic   Anticoagulant Therapy  Managed by: Weston Brass, PharmD Referring MD: Arvilla Meres PCP: Jayme Cloud, MD Supervising MD: Graciela Husbands MD, Viviann Spare Indication 1: Atrial Fibrillation (ICD-427.31) Indication 2: Pending Ablation (10/26) Lab Used: [facility] [place] Site: [address] INR POC 2.1 INR RANGE 2 - 3  Dietary changes: no    Health status changes: no    Bleeding/hemorrhagic complications: no    Recent/future hospitalizations: no    Any changes in medication regimen? no    Recent/future dental: no  Any missed doses?: no       Is patient compliant with meds? yes       Allergies: No Known Drug Allergies  Anticoagulation Management History:      The patient is taking warfarin and comes in today for a routine follow up visit.  Positive risk factors for bleeding include an age of 72 years or older.  The bleeding index is 'intermediate risk'.  Negative CHADS2 values include Age > 72 years old.  The start date was 08/15/2005.  His last INR was 3.2 ratio.  Anticoagulation responsible provider: Graciela Husbands MD, Viviann Spare.  INR POC: 2.1.  Exp: 12/2010.    Anticoagulation Management Assessment/Plan:      The patient's current anticoagulation dose is Warfarin sodium 1 mg tabs: Use as directed by Anticoagualtion Clinic.  The target INR is 2 - 3.  The next INR is due 04/27/2010.  Anticoagulation instructions were given to patient.  Results were reviewed/authorized by Weston Brass, PharmD.  He was notified by Weston Brass PharmD.         Prior Anticoagulation Instructions: INR 2.7  Spoke with pt.  Continue same dose of 1/2 tablet every day.  Recheck INR in 1 week.   Current Anticoagulation Instructions: INR 2.1  LMOM for pt to continue with same dose of 1 mg daily. Will calll if any issues.  Recheck INR in 1 wek.

## 2010-05-11 ENCOUNTER — Encounter: Payer: Self-pay | Admitting: Internal Medicine

## 2010-05-11 LAB — CONVERTED CEMR LAB: POC INR: 2.5

## 2010-05-11 NOTE — Medication Information (Addendum)
Summary: Coumadin Clinic   Anticoagulant Therapy  Managed by: Weston Brass, PharmD Referring MD: Arvilla Meres PCP: Jayme Cloud, MD Supervising MD: Ladona Ridgel MD, Sharlot Gowda Indication 1: Atrial Fibrillation (ICD-427.31) Indication 2: Pending Ablation (10/26) Lab Used: [facility] [place] Site: [address] INR POC 2.5 INR RANGE 2 - 3  Dietary changes: no    Health status changes: no    Bleeding/hemorrhagic complications: no    Recent/future hospitalizations: no    Any changes in medication regimen? no    Recent/future dental: no  Any missed doses?: no       Is patient compliant with meds? yes       Allergies: No Known Drug Allergies  Anticoagulation Management History:      His anticoagulation is being managed by telephone today.  Positive risk factors for bleeding include an age of 23 years or older.  The bleeding index is 'intermediate risk'.  Negative CHADS2 values include Age > 55 years old.  The start date was 08/15/2005.  His last INR was 3.2 ratio.  Anticoagulation responsible provider: Ladona Ridgel MD, Sharlot Gowda.  INR POC: 2.5.  Exp: 12/2010.    Anticoagulation Management Assessment/Plan:      The patient's current anticoagulation dose is Warfarin sodium 1 mg tabs: Use as directed by Anticoagualtion Clinic.  The target INR is 2 - 3.  The next INR is due 05/11/2010.  Anticoagulation instructions were given to patient.  Results were reviewed/authorized by Weston Brass, PharmD.  He was notified by Weston Brass PharmD.         Prior Anticoagulation Instructions: INR 2.4  Spoke with pt's wife.  Continue same dose of 1/2 tablet every day.  Recheck INR in 1 week.   Current Anticoagulation Instructions: INR 2.5  LMOM for pt to continue same dose of 1/2 tablet evey day.  Recheck INR in 1 week.

## 2010-05-18 ENCOUNTER — Encounter: Payer: Self-pay | Admitting: Internal Medicine

## 2010-05-19 ENCOUNTER — Encounter: Payer: Self-pay | Admitting: Internal Medicine

## 2010-05-19 NOTE — Medication Information (Signed)
Summary: Coumadin Clinic   Anticoagulant Therapy  Managed by: Weston Brass, PharmD Referring MD: Arvilla Meres PCP: Jayme Cloud, MD Supervising MD: Graciela Husbands MD, Viviann Spare Indication 1: Atrial Fibrillation (ICD-427.31) Indication 2: Pending Ablation (10/26) Lab Used: [facility] [place] Site: [address] INR POC 2.5 INR RANGE 2 - 3  Dietary changes: no    Health status changes: no    Bleeding/hemorrhagic complications: no    Recent/future hospitalizations: no    Any changes in medication regimen? no    Recent/future dental: no  Any missed doses?: no       Is patient compliant with meds? yes       Allergies: No Known Drug Allergies  Anticoagulation Management History:      His anticoagulation is being managed by telephone today.  Positive risk factors for bleeding include an age of 72 years or older.  The bleeding index is 'intermediate risk'.  Negative CHADS2 values include Age > 56 years old.  The start date was 08/15/2005.  His last INR was 3.2 ratio.  Anticoagulation responsible provider: Graciela Husbands MD, Viviann Spare.  INR POC: 2.5.  Exp: 12/2010.    Anticoagulation Management Assessment/Plan:      The patient's current anticoagulation dose is Warfarin sodium 1 mg tabs: Use as directed by Anticoagualtion Clinic.  The target INR is 2 - 3.  The next INR is due 05/18/2010.  Anticoagulation instructions were given to patient.  Results were reviewed/authorized by Weston Brass, PharmD.  He was notified by Weston Brass PharmD.         Prior Anticoagulation Instructions: INR 2.5  LMOM for pt to continue same dose of 1/2 tablet evey day.  Recheck INR in 1 week.   Current Anticoagulation Instructions: INR 2.5  Spoke with pt.  Continue same dose of 1/2 tablet every day.  Recheck INR in 1 week.

## 2010-05-25 ENCOUNTER — Encounter: Payer: Self-pay | Admitting: Cardiovascular Disease

## 2010-05-25 NOTE — Medication Information (Signed)
Summary: Coumadin Clinic  Anticoagulant Therapy  Managed by: Windell Hummingbird, RN Referring MD: Arvilla Meres PCP: Jayme Cloud, MD Supervising MD: Tenny Craw MD, Gunnar Fusi Indication 1: Atrial Fibrillation (ICD-427.31) Indication 2: Pending Ablation (10/26) Lab Used: [facility] [place] Site: [address] INR POC 2.4 INR RANGE 2 - 3  Dietary changes: no    Health status changes: no    Bleeding/hemorrhagic complications: no    Recent/future hospitalizations: no    Any changes in medication regimen? no    Recent/future dental: no  Any missed doses?: no       Is patient compliant with meds? yes       Allergies: No Known Drug Allergies  Anticoagulation Management History:      His anticoagulation is being managed by telephone today.  Positive risk factors for bleeding include an age of 72 years or older.  The bleeding index is 'intermediate risk'.  Negative CHADS2 values include Age > 66 years old.  The start date was 08/15/2005.  His last INR was 3.2 ratio.  Anticoagulation responsible provider: Tenny Craw MD, Gunnar Fusi.  INR POC: 2.4.    Anticoagulation Management Assessment/Plan:      The patient's current anticoagulation dose is Warfarin sodium 1 mg tabs: Use as directed by Anticoagualtion Clinic.  The target INR is 2 - 3.  The next INR is due 05/25/2010.  Anticoagulation instructions were given to patient.  Results were reviewed/authorized by Windell Hummingbird, RN.  He was notified by Windell Hummingbird, RN, BSN.         Prior Anticoagulation Instructions: INR 2.5  Spoke with pt.  Continue same dose of 1/2 tablet every day.  Recheck INR in 1 week.   Current Anticoagulation Instructions: INR 2.4 Continue taking same dose of 1/2 tablet every day.  Recheck in 1 week.  Sp. w/ pt. and gave instructions. Windell Hummingbird, RN, BSN  May 19, 2010 10:18 AM

## 2010-05-26 ENCOUNTER — Ambulatory Visit: Payer: Self-pay | Admitting: Internal Medicine

## 2010-05-30 ENCOUNTER — Ambulatory Visit: Payer: Self-pay | Admitting: Internal Medicine

## 2010-05-31 NOTE — Medication Information (Signed)
Summary: Coumadin Clinic   Anticoagulant Therapy  Managed by: Windell Hummingbird, RN Referring MD: Arvilla Meres PCP: Jayme Cloud, MD Supervising MD: Eden Emms MD, Theron Arista Indication 1: Atrial Fibrillation (ICD-427.31) Indication 2: Pending Ablation (10/26) Lab Used: [facility] [place] Site: [address] INR POC 2.5 INR RANGE 2 - 3  Dietary changes: no    Health status changes: no    Bleeding/hemorrhagic complications: no    Recent/future hospitalizations: no    Any changes in medication regimen? no    Recent/future dental: no  Any missed doses?: no       Is patient compliant with meds? yes       Allergies: No Known Drug Allergies  Anticoagulation Management History:      His anticoagulation is being managed by telephone today.  Positive risk factors for bleeding include an age of 72 years or older.  The bleeding index is 'intermediate risk'.  Negative CHADS2 values include Age > 72 years old.  The start date was 08/15/2005.  His last INR was 3.2 ratio.  Anticoagulation responsible provider: Eden Emms MD, Theron Arista.  INR POC: 2.5.    Anticoagulation Management Assessment/Plan:      The patient's current anticoagulation dose is Warfarin sodium 1 mg tabs: Use as directed by Anticoagualtion Clinic.  The target INR is 2 - 3.  The next INR is due 06/01/2010.  Anticoagulation instructions were given to patient.  Results were reviewed/authorized by Windell Hummingbird, RN.  He was notified by Thomasena Edis.         Prior Anticoagulation Instructions: INR 2.4 Continue taking same dose of 1/2 tablet every day.  Recheck in 1 week.  Sp. w/ pt. and gave instructions. Windell Hummingbird, RN, BSN  May 19, 2010 10:18 AM  Current Anticoagulation Instructions: INR 2.5 Pt. called in result.  Continue taking 1/2 tablet (1 mg) every day. Recheck in 1 week. Windell Hummingbird  May 25, 2010 8:21 AM

## 2010-06-02 ENCOUNTER — Encounter: Payer: Self-pay | Admitting: Internal Medicine

## 2010-06-08 ENCOUNTER — Encounter: Payer: Self-pay | Admitting: Internal Medicine

## 2010-06-09 ENCOUNTER — Encounter: Payer: Self-pay | Admitting: Internal Medicine

## 2010-06-09 ENCOUNTER — Encounter (INDEPENDENT_AMBULATORY_CARE_PROVIDER_SITE_OTHER): Payer: Self-pay | Admitting: *Deleted

## 2010-06-13 ENCOUNTER — Telehealth: Payer: Self-pay | Admitting: Internal Medicine

## 2010-06-14 NOTE — Letter (Signed)
Summary: Appointment - Reschedule  [facility], Main Office  [address]   [place], [place] [address]   Phone: [phone]  Fax: [phone]     June 09, 2010 MRN: [id number]   Antonio Carrillo [address] [place] [address] [place], [place]  [address]   Dear Mr. LYCAN,   Due to a change in our office schedule, your appointment on March 21,2012 at 2:45 must be changed.  It is very important that we reach you to reschedule this appointment. We look forward to participating in your health care needs. Please contact us at the number listed above at your earliest convenience to reschedule this appointment.     Sincerely, Control and instrumentation engineer

## 2010-06-15 ENCOUNTER — Encounter (INDEPENDENT_AMBULATORY_CARE_PROVIDER_SITE_OTHER): Payer: Self-pay | Admitting: *Deleted

## 2010-06-15 ENCOUNTER — Encounter: Payer: Self-pay | Admitting: Cardiology

## 2010-06-21 NOTE — Medication Information (Signed)
Summary: Coumadin Clinic  Anticoagulant Therapy  Managed by: Bethena Midget, RN, BSN Referring MD: Arvilla Meres PCP: Jayme Cloud, MD Supervising MD: Riley Kill MD, Maisie Fus Indication 1: Atrial Fibrillation (ICD-427.31) Indication 2: Pending Ablation (10/26) Lab Used: [facility] [place] Site: [address] INR POC 2.2 INR RANGE 2 - 3  Dietary changes: no    Health status changes: no    Bleeding/hemorrhagic complications: no    Recent/future hospitalizations: no    Any changes in medication regimen? no    Recent/future dental: no  Any missed doses?: no       Is patient compliant with meds? yes       Allergies: No Known Drug Allergies  Anticoagulation Management History:      His anticoagulation is being managed by telephone today.  Positive risk factors for bleeding include an age of 72 years or older.  The bleeding index is 'intermediate risk'.  Negative CHADS2 values include Age > 73 years old.  The start date was 08/15/2005.  His last INR was 3.2 ratio.  Anticoagulation responsible provider: Riley Kill MD, Maisie Fus.  INR POC: 2.2.    Anticoagulation Management Assessment/Plan:      The patient's current anticoagulation dose is Warfarin sodium 1 mg tabs: Use as directed by Anticoagualtion Clinic.  The target INR is 2 - 3.  The next INR is due 06/22/2010.  Anticoagulation instructions were given to spouse.  Results were reviewed/authorized by Bethena Midget, RN, BSN.  He was notified by Bethena Midget, RN, BSN.         Prior Anticoagulation Instructions: INR 2.2  Called spoke with pt, advised to continue on same dosage 1mg  daily.  Recheck in 1 week.   Current Anticoagulation Instructions: INR 2.2 Continue 1mg s daily. Recheck in one week.

## 2010-06-21 NOTE — Progress Notes (Signed)
Summary: echo appt prior to may appt   Phone Note Call from Patient Call back at Home Phone [phone]   Caller: Patient [phone]  Reason for Call: Talk to Nurse Summary of Call: pt would like to have an echo prior to appt in may Initial call taken by: Lorne Skeens,  June 13, 2010 8:41 AM  Follow-up for Phone Call        pt will be due for echo in May, order placed Meredith Staggers, RN  June 13, 2010 12:36 PM

## 2010-06-22 ENCOUNTER — Ambulatory Visit: Payer: Self-pay | Admitting: Cardiovascular Disease

## 2010-06-22 ENCOUNTER — Ambulatory Visit: Payer: Self-pay | Admitting: Internal Medicine

## 2010-06-22 DIAGNOSIS — Z7901 Long term (current) use of anticoagulants: Secondary | ICD-10-CM | POA: Insufficient documentation

## 2010-06-22 LAB — POCT INR: INR: 2.2

## 2010-06-22 NOTE — Patient Instructions (Signed)
Continue same dose of 1mg  daily.  Recheck INR in 1 week.

## 2010-06-30 ENCOUNTER — Encounter: Payer: Self-pay | Admitting: Internal Medicine

## 2010-06-30 ENCOUNTER — Other Ambulatory Visit (HOSPITAL_COMMUNITY): Payer: Medicare Other

## 2010-06-30 ENCOUNTER — Ambulatory Visit (INDEPENDENT_AMBULATORY_CARE_PROVIDER_SITE_OTHER): Payer: Medicare Other | Admitting: Cardiology

## 2010-06-30 DIAGNOSIS — Z7901 Long term (current) use of anticoagulants: Secondary | ICD-10-CM

## 2010-06-30 LAB — POCT INR: INR: 2.8

## 2010-06-30 NOTE — Patient Instructions (Signed)
Called spoke with pt's wife advised to have pt continue on same dosage 1mg  daily.  Recheck in 1 week.

## 2010-07-06 ENCOUNTER — Ambulatory Visit (INDEPENDENT_AMBULATORY_CARE_PROVIDER_SITE_OTHER): Payer: Self-pay | Admitting: Cardiology

## 2010-07-06 DIAGNOSIS — R0989 Other specified symptoms and signs involving the circulatory and respiratory systems: Secondary | ICD-10-CM

## 2010-07-06 LAB — POCT INR: INR: 2.1

## 2010-07-07 LAB — PROTIME-INR: INR: 2.37 — ABNORMAL HIGH (ref 0.00–1.49)

## 2010-07-08 ENCOUNTER — Encounter: Payer: Self-pay | Admitting: Internal Medicine

## 2010-07-09 LAB — BASIC METABOLIC PANEL
CO2: 27 mEq/L (ref 19–32)
Chloride: 105 mEq/L (ref 96–112)
GFR calc Af Amer: >60 mL/min (ref >60)
Potassium: 4.5 mEq/L (ref 3.5–5.1)
Sodium: 138 mEq/L (ref 135–145)

## 2010-07-09 LAB — CBC
HCT: 44.6 % (ref 39.0–52.0)
Hemoglobin: 15.1 g/dL (ref 13.0–17.0)
MCHC: 33.8 g/dL (ref 30.0–36.0)
MCV: 90.8 fL (ref 78.0–100.0)
MCV: 91 fL (ref 78.0–100.0)
Platelets: 171 10*3/uL (ref 150–400)
RBC: 4.7 MIL/uL (ref 4.22–5.81)
RBC: 4.9 MIL/uL (ref 4.22–5.81)
WBC: 5.7 10*3/uL (ref 4.0–10.5)
WBC: 6.4 10*3/uL (ref 4.0–10.5)

## 2010-07-09 LAB — APTT: aPTT: 41 seconds — ABNORMAL HIGH (ref 24–37)

## 2010-07-09 LAB — PROTIME-INR: INR: 2.4 — ABNORMAL HIGH (ref 0.00–1.49)

## 2010-07-13 ENCOUNTER — Encounter: Payer: Self-pay | Admitting: Internal Medicine

## 2010-07-13 ENCOUNTER — Ambulatory Visit (INDEPENDENT_AMBULATORY_CARE_PROVIDER_SITE_OTHER): Payer: Self-pay | Admitting: Cardiology

## 2010-07-13 DIAGNOSIS — I4891 Unspecified atrial fibrillation: Secondary | ICD-10-CM

## 2010-07-13 LAB — PROTIME-INR

## 2010-07-13 LAB — POCT INR: INR: 2

## 2010-07-27 ENCOUNTER — Encounter: Payer: Self-pay | Admitting: Internal Medicine

## 2010-07-27 ENCOUNTER — Ambulatory Visit (INDEPENDENT_AMBULATORY_CARE_PROVIDER_SITE_OTHER): Payer: Self-pay | Admitting: Internal Medicine

## 2010-07-27 ENCOUNTER — Ambulatory Visit: Payer: Medicare Other | Admitting: Internal Medicine

## 2010-07-27 DIAGNOSIS — R0989 Other specified symptoms and signs involving the circulatory and respiratory systems: Secondary | ICD-10-CM

## 2010-07-27 LAB — PROTIME-INR

## 2010-08-03 ENCOUNTER — Ambulatory Visit: Payer: Self-pay | Admitting: Cardiovascular Disease

## 2010-08-03 ENCOUNTER — Encounter: Payer: Self-pay | Admitting: Internal Medicine

## 2010-08-10 ENCOUNTER — Encounter: Payer: Self-pay | Admitting: Internal Medicine

## 2010-08-10 ENCOUNTER — Ambulatory Visit (INDEPENDENT_AMBULATORY_CARE_PROVIDER_SITE_OTHER): Payer: Medicare Other | Admitting: Cardiology

## 2010-08-10 DIAGNOSIS — I4891 Unspecified atrial fibrillation: Secondary | ICD-10-CM

## 2010-08-10 LAB — POCT INR: INR: 2.4

## 2010-08-15 ENCOUNTER — Other Ambulatory Visit: Payer: Self-pay | Admitting: Internal Medicine

## 2010-08-16 NOTE — Assessment & Plan Note (Signed)
[facility]                            CARDIOLOGY OFFICE NOTE   NAME:Antonio Carrillo                         MRN:          [id number]  DATE:04/27/2008                            DOB:          [birth year]    INTERVAL HISTORY:  Antonio Carrillo is a very pleasant 70 year old male with a  history of atrial fibrillation and flutter.  He underwent atrial flutter  ablation in 1997 in [place].  He also had an atrial fibrillation ablation  at [facility] about a year or so ago.  He had recurrent atrial fibrillation  after his ablation and was placed on flecainide.  Last month I saw him  with atrial flutter.  He underwent cardioversion.  Unfortunately, he was  recently down in [place] and had a breakthrough atrial  fibrillation/flutter for which he underwent cardioversion by Dr.  Evelene Carrillo and his flecainide was stopped.  There was some discussion  about possible staged ablation going after his flutter first and seeing  whether or not that would quiet things down or whether he also need an  atrial fibrillation touch up.  Of note, he has been intolerant of  Tikosyn in the past due to QT prolongation.   He still says that his heart feels irregular.  He is quite stressed out  about the recurrent atrial fibrillation/flutter and is very interested  in proceeding with an ablation.  He did have an dobutamine  echocardiogram while back which showed normal LV function with no  ischemia.  His aortic root was mildly dilated at 4.4 cm.  He has not had  any problems with his Coumadin.  His INR has been therapeutic.   CURRENT MEDICATIONS:  1. Coumadin.  2. Avapro 300 a day.  3. Metoprolol 25 b.i.d.  4. Aspirin 81 a day.  5. Fish oil.   PHYSICAL EXAMINATION:  GENERAL:  He is well appearing in no acute  distress.  He ambulates around the clinic without any respiratory  difficulty.  VITAL SIGNS:  Blood pressure is 150/70, heart rate is 83, weight is 248.  HEENT:  Normal.  NECK:  Supple.  No  JVD.  Carotids are 2+ bilaterally without bruits.  There is no lymphadenopathy or thyromegaly.  CARDIAC:  PMI is nondisplaced.  He is irregularly irregular.  No  murmurs, rubs, or gallops.  LUNGS:  Clear.  ABDOMEN:  Soft, nontender, nondistended.  No hepatosplenomegaly.  No  bruits.  No masses.  Good bowel sounds.  EXTREMITIES:  Warm with no cyanosis, clubbing, or edema.  No rash.  NEUROLOGIC:  Alert and oriented x3.  Cranial nerves II through XII are  intact.  Moves all 4 extremities without difficulty.  PSYCHIATRIC:  Affect is pleasant.   EKG shows sinus rhythm with frequent premature atrial complexes.   ASSESSMENT AND PLAN:  1. Recurrent atrial fibrillation/flutter.  This has been quite a      problem for Antonio Carrillo.  He is following up with Dr. Evelene Carrillo in      Electrophysiology in [place].  I will touch base with Dr. Evelene Carrillo to      see  whether or not it would be reasonable to consider a short      course of amiodarone or even dronedarone prior to his ablation to      help quiet things down.  2. Hyperlipidemia.  Continue current therapy.  We will need to check a      lipid panel at the next visit.  3. Hypertension.  Blood pressure is mildly elevated today.  He is      quite stressed out.  We will continue to follow this.  I will not      make any adjustments today.   DISPOSITION:  I will touch base with Dr. Evelene Carrillo regarding possible  antiarrhythmic therapy and ablation.     Bevelyn Buckles. Bensimhon, MD  Electronically Signed    DRB/MedQ  DD: 04/27/2008  DT: 04/27/2008  Job #: [id number]   cc:   Raliegh Ip, MD

## 2010-08-16 NOTE — H&P (Signed)
[name], [name]                  ACCOUNT NO.:  [id number]   MEDICAL RECORD NO.:  [id number]          PATIENT TYPE:  AMB   LOCATION:  ENDO                         FACILITY:  [facility]   PHYSICIAN:  Bevelyn Buckles. Bensimhon, MDDATE OF BIRTH:  October 03, 1940   DATE OF ADMISSION:  03/03/2008  DATE OF DISCHARGE:                              HISTORY & PHYSICAL   [place] [facility] NOTE:   HISTORY OF PRESENT ILLNESS:  Antonio Carrillo is a very pleasant 70 year old male  with a history of atrial flutter status post remote ablation.  Antonio Carrillo also  has a history of atrial fibrillation and underwent ablation by Dr. Callie Fielding about a year ago.  Antonio Carrillo has been since maintained on flecainide  without any recurrence.  Antonio Carrillo has also been on Coumadin.   Antonio Carrillo noticed that on Friday Antonio Carrillo was tachycardiac throughout the week.  Antonio Carrillo  has remained somewhat tachycardiac, and Antonio Carrillo was concerned that Antonio Carrillo was  back in atrial fibrillation.  Antonio Carrillo called me this morning, we brought him  into the office, and it appears that Antonio Carrillo is now in atrial flutter.  I  showed this to Dr. Lewayne Bunting who thinks that it is likely a left-  sided atrial flutter which is somewhat symptomatic.  We decided to  pursue cardioversion.  However, his INR was somewhat subtherapeutic last  week at 1.7, and Antonio Carrillo will thus need a TEE beforehand.   PROBLEM LIST:  1. Atrial flutter status post ablation.  2. Atrial fibrillation status post ablation by Dr. Deno Lunger at [facility]  3. Osteoarthritis status post knee replacement.  4. Hypertension.  5. History of dobutamine echocardiogram that showed normal LV function      without any ischemia.  Antonio Carrillo does have a minimally dilated aortic      root.   CURRENT MEDICATIONS:  1. Coumadin.  2. Flecainide 100 b.i.d.  3. Aspirin 81 a day.  4. Avapro 300 a day.  5. Atenolol 50 a day.   ALLERGIES:  No known drug allergies.  Antonio Carrillo has been intolerant of TIKOSYN  due to QT prolongation.   REVIEW OF SYSTEMS:  Negative except for HPI and  problem list.   SOCIAL HISTORY:  Antonio Carrillo is married.  Antonio Carrillo does not smoke.  Antonio Carrillo is retired.   FAMILY HISTORY:  Noncontributory.   PHYSICAL EXAMINATION:  GENERAL:  Antonio Carrillo is in no acute distress.  VITAL SIGNS:  Blood pressure is 150/102, heart rate is 112.  HEENT:  Normal.  NECK:  Supple.  No JVD.  Carotids are 2+ bilaterally without any bruits.  There is no lymphadenopathy or thyromegaly.  CARDIAC:  PMI is nondisplaced.  Antonio Carrillo is regular and tachycardiac.  No  murmurs, rubs or gallops.  LUNGS:  Clear.  ABDOMEN:  Soft, nontender, nondistended.  No hepatosplenomegaly, no  bruits, no masses.  Good bowel sounds.  EXTREMITIES:  Warm with no cyanosis, clubbing or edema.  No rash.  NEUROLOGIC:  Alert and oriented x3.  Cranial nerves II-XII are intact.  Moves all fours extremities without difficulty.  Affect is pleasant.   ELECTROCARDIOGRAM:  Atrial flutter at  a rate of 112.   ASSESSMENT/PLAN:  1. Atrial flutter status post previous atrial fibrillation ablation.      I have reviewed this with Dr. Ladona Ridgel.  We will proceed with      cardioversion today.  As his INR was subtherapeutic last week, we      will perform TEE beforehand to rule out left atrial appendage clot.  In discussing with Dr. Ladona Ridgel, it appears that this is likely a left-  sided flutter; thus, we will not proceed to follow-up ablation unless Antonio Carrillo  has a recurrence.  I will also discuss this with Dr. Deno Lunger.  INR  today is 3.0.  1. Hypertension.  Blood pressure is elevated today in the setting of a      significant amount of stress.  It has been well controlled in the      past.  We will see how Antonio Carrillo does post cardioversion.      Bevelyn Buckles. Bensimhon, MD  Electronically Signed     DRB/MEDQ  D:  03/03/2008  T:  03/03/2008  Job:  [id number]

## 2010-08-16 NOTE — Discharge Summary (Signed)
NAMEREEVES, MUSICK                  ACCOUNT NO.:  [id number]   MEDICAL RECORD NO.:  [id number]          PATIENT TYPE:  AMB   LOCATION:  ENDO                         FACILITY:  [facility]   PHYSICIAN:  Bevelyn Buckles. Bensimhon, MDDATE OF BIRTH:  [birth year]   DATE OF ADMISSION:  03/03/2008  DATE OF DISCHARGE:                         DISCHARGE SUMMARY - REFERRING   PATIENT IDENTIFICATION:  Antonio Carrillo is a 70 year old male with a history of  atrial flutter and atrial fibrillation.  He underwent an atrial flutter  ablation many years ago and recently underwent an atrial fibrillation  ablation.  He has been maintained on flecainide for about a year after  his ablation.  He presented today with recurrent atrial flutter.  He has  been on long-term Coumadin.  However, his INR last week was 1.7, so he  was brought in today for TEE guided cardioversion.   TEE showed an ejection fraction of 60-65%.  There was no left atrial  appendage clot.  After appropriate sedation, he received synchronized  biphasic 120 dual-shock.  He initially had a brief period of asystole  and then went into a sinus bradycardia with PVCs.  There are no apparent  complications.      Bevelyn Buckles. Bensimhon, MD  Electronically Signed     DRB/MEDQ  D:  03/03/2008  T:  03/03/2008  Job:  [id number]

## 2010-08-16 NOTE — Assessment & Plan Note (Signed)
HEALTHCARE                            CARDIOLOGY OFFICE NOTE   NAME:Antonio Carrillo, Antonio Carrillo                         MRN:          [id number]  DATE:03/23/2008                            DOB:          Jan 25, 1941    IDENTIFICATION:  Antonio Carrillo is a very pleasant 70 year old male with a history  of atrial flutter, status post previous ablation.  He also has a history  of atrial fibrillation and underwent ablation by Dr. Callie Fielding about  a year ago at [facility].  He has been maintained on flecainide.  He also has  a history of ascending aortic aneurysm and hypertension.  He had a  dobutamine echocardiogram sometime back which showed normal LV function  with no evidence of ischemia.   About 3 weeks ago, he had an episode of palpitations.  He was found to  have recurrent atrial flutter.  This was presumed to be a left-sided  flutter.  Given that, his INR was mildly therapeutic.  He underwent PE-  guided cardioversion without problems.   Since that time, he has been doing well.  He has not had any recurrent  atrial fibrillation or flutter, though he does have frequent ectopic  beats.  He denies any chest pain.  He remains very active with daily  exercise.  He has not had any heart failure symptoms.  He is concerned  about his cholesterol.   CURRENT MEDICATIONS:  1. Coumadin.  2. Flecainide 100 b.i.d.  3. Aspirin 81.  4. Avapro 300 a day.  5. Atenolol 50 a day.  6. Also, takes fish oil.   PHYSICAL EXAMINATION:  GENERAL:  He is well-appearing in no acute  distress.  He ambulates around the clinic without any respiratory  difficulty.  VITAL SIGNS:  Blood pressure 118/78, heart rate 64, and weight is 245.  HEENT:  Normal.  NECK:  Supple.  There is no JVD.  Carotids are 2+ bilaterally without  any bruits.  There is no lymphadenopathy or thyromegaly.  CARDIAC:  PMI  is nondisplaced.  He is regular with occasional ectopy.  No murmurs,  rubs, or gallops.  LUNGS:  Clear.  ABDOMEN:  Soft, nontender, and nondistended.  No hepatosplenomegaly.  No  bruits.  No masses.  Good bowel sounds.  EXTREMITIES:  Warm with no  cyanosis, clubbing, or edema.  No rash.  NEURO:  Alert and x3.  Cranial nerves II through XII are intact.  Moves  all 4 extremities without difficulty.  Affect is pleasant.   EKG shows sinus rhythm with frequent PVCs and right bundle-branch block.   ASSESSMENT:  1. Atrial fibrillation/flutter.  He is now status post 2 ablations.      He did have recurrence of his atrial flutter earlier in the month.      He is now status post cardioversion.  He is maintaining sinus      rhythm.  We will continue flecainide.  Should he have another      breakthrough, we can consider touch-up ablation as needed.  Him and      I discussed this  today.  Of note, he was previously intolerant of      TIKOSYN.  2. Hypertension.  Blood pressure is well controlled.  3. Hyperlipidemia.  Goal LDL is less than 100.  We will send him for a      fasting lipid panel today as well as check a C-reactive protein and      have a low threshold to initiate statin therapy.  4. Ascending aortic aneurysm.  He is due for his followup CT in 6      months.   DISPOSITION:  We will see him back in 6 months for routine followup.     Bevelyn Buckles. Bensimhon, MD  Electronically Signed    DRB/MedQ  DD: 03/23/2008  DT: 03/23/2008  Job #: [id number]

## 2010-08-16 NOTE — Op Note (Signed)
NAMEDWAIN, HUHN NO.:  [id number]   MEDICAL RECORD NO.:  [id number]          PATIENT TYPE:  OIB   LOCATION:  2899                         FACILITY:  [facility]   PHYSICIAN:  Bevelyn Buckles. Bensimhon, MDDATE OF BIRTH:  1941-03-09   DATE OF PROCEDURE:  11/17/2008  DATE OF DISCHARGE:                               OPERATIVE REPORT   PROCEDURE PERFORMED:  Cardioversion.   PATIENT IDENTIFICATION:  Antonio Carrillo is a 70 year old male well-known to me.  He has a history of recurrent atrial flutter and fibrillation.  He  presented to the office yesterday and was found to be in atrial  fibrillation with rapid ventricular response at 130 beats per minute.  We discussed the risks and indication of repeat cardioversion and we  decided to proceed.  His INR has been greater than 2 for more than the  last month.   DESCRIPTION OF PROCEDURE:  After appropriate sedation by Dr. Sondra Come  anesthesia with IV propofol, he received 150 joule biphasic shock in a  synchronized fashion with prompt conversion to sinus rhythm.  There are  no apparent complications.  He will follow up with Dr. Lora Havens in  [place] for possible repeat ablation.      Bevelyn Buckles. Bensimhon, MD  Electronically Signed     Bevelyn Buckles. Bensimhon, MD  Electronically Signed    DRB/MEDQ  D:  11/17/2008  T:  11/17/2008  Job:  [id number]

## 2010-08-16 NOTE — Assessment & Plan Note (Signed)
[facility]                            CARDIOLOGY OFFICE NOTE   NAME:Antonio Carrillo, Antonio Carrillo                         MRN:          [id number]  DATE:04/25/2007                            DOB:          Feb 10, 1941    INTERVAL HISTORY:  Antonio Carrillo is a very pleasant 70 year old male with a  history of atrial fibrillation, status post atrial fibrillation ablation  at [facility].  He has had recurrent atrial fibrillation after his ablation,  and has been placed on flecainide.  He has been intolerant of Tikosyn  due to QT prolongation.   He returns today for routine followup.  He recently had a revision of  his right knee replacement, and has been struggling with that a bit; but  otherwise he feels great.  He has not had any more atrial fibrillation.  He does have occasional irregular beats which worry him, that he may go  back into atrial fibrillation; but these just usually last a brief  period of time and go away.  He was back in [place], and saw his  cardiologist in [place] who was concerned over a possible proarrhythmic  effects of flecainide.  He underwent a dobutamine echocardiogram that  showed normal LV function without any ischemia.  He did have some minor  nonspecific ST-T wave changes.  His aortic root was minimally dilated at  4.4 cm.   CURRENT MEDICATIONS:  1. Coumadin.  2. Flecainide 100 b.i.d.  3. Aspirin 81 a day.  4. Avapro 300 a day.  5. Atenolol 50 a day.   PHYSICAL EXAM:  He is well appearing, in no acute distress, ambulates  around the clinic without any respiratory difficulty.  He is getting  around fairly well on his knee.  Blood pressure 122/78, heart rate 69, weight is 237.  HEENT:  Normal.  NECK:  Supple.  There is no JVD.  Carotids are 2+ bilateral bruits.  There is no lymphadenopathy or thyromegaly.  CARDIAC:  PMI is nondisplaced.  He is mildly irregular.  No murmurs,  rubs, or gallops.  LUNGS:  Clear.  ABDOMEN:  Soft, nontender, nondistended,  no hepatosplenomegaly, no  bruits, no masses.  Good bowel sounds.  EXTREMITIES:  Warm with no cyanosis or clubbing.  He has some edema in  his right leg where he had surgery.  No rash.  NEUROLOGIC:  He is alert and oriented x3.  Cranial nerves II-XII are  intact.  Moves all four extremities without difficulty.  Affect is  pleasant.   EKG:  Shows normal sinus rhythm with a nonspecific interventricular  conduction delay, at 122 msec with diffuse mild ST-T wave abnormalities  throughout.   ASSESSMENT AND PLAN:  1. Atrial fibrillation.  He is doing well.  We had a long talk about      the risks and benefits of flecainide.  I think that the given fact      that he has a relatively structurally normal heart with no      significant ischemia on his stress test, flecainide should be a      good  choice for him as it has worked well.  We can continue the      flecainide and Coumadin.  2. Hyperlipidemia.  He will get his lipids checked with his primary      care doctor.  I suggested the fact that he keep his LDL below 100.      We will start a statin if necessary.  3. Palpitations.  These appear to be mostly premature atrial      contractions.  We will continue his beta blocker; however, I am      going to switch his atenolol over the Toprol, as I feel it is a      better agent for him.   DISPOSITION:  We will see him back in clinic in 6 months for routine  followup.  He will need yearly echocardiogram to follow his aortic root  size.     Bevelyn Buckles. Bensimhon, MD  Electronically Signed    DRB/MedQ  DD: 04/25/2007  DT: 04/26/2007  Job #: [id number]

## 2010-08-16 NOTE — Op Note (Signed)
NAMEKEYSTON, ARDOLINO NO.:  [id number]   MEDICAL RECORD NO.:  [id number]          PATIENT TYPE:  INP   LOCATION:  3707                         FACILITY:  [facility]   PHYSICIAN:  Bevelyn Buckles. Bensimhon, MDDATE OF BIRTH:  1941/03/23   DATE OF PROCEDURE:  09/26/2006  DATE OF DISCHARGE:                               OPERATIVE REPORT   DIRECT CURRENT CARDIOVERSION PROCEDURE NOTE   PATIENT IDENTIFICATION:  Mr. Antonio Carrillo is a very pleasant 70 year old male  with recurrent atrial fibrillation.  He is status post atrial  fibrillation ablation in November 2007 at [facility].  He had some post  ablation atrial fibrillation and was maintained on flecainide.  He  stopped the flecainide about a month ago.  He was admitted last night  with symptomatic atrial fibrillation of less than 48 hours' duration.  Given his symptoms, the plan was to proceed with direct current  cardioversion and restart him on his flecainide.   DESCRIPTION OF PROCEDURE:  The patient was sedated by anesthesia.  Once  appropriate sedation was achieved, a single synchronized 150 joule  biphasic shock was delivered with prompt reversion to normal sinus  rhythm.  No apparent complications.   DISPOSITION:  The patient will recover here in the hospital and then be  discharged this afternoon.  We will restart him back on his flecainide  at 100 b.i.d., and he will follow up with me and Dr. Deno Lunger at [facility]  for further disposition.      Bevelyn Buckles. Bensimhon, MD  Electronically Signed     DRB/MEDQ  D:  09/26/2006  T:  09/26/2006  Job:  [id number]

## 2010-08-16 NOTE — Assessment & Plan Note (Signed)
[facility]                               LIPID CLINIC NOTE   NAME:Carstens, BHAVIN MONJARAZ                         MRN:          [id number]  DATE:08/15/2007                            DOB:          [birth year]    I received a call from Dr. Lina Sar today at approximately noon  stating that Mr. Torrez colonoscopy which was performed this morning  at [facility] went well.  He had no polyps that required removal and no  biopsies were taken.  She cleared Mr. Menning to begin full-dose Lovenox  this evening.  Based on the weight of the patient from her last office  visit up to 235 pounds and his history of normal renal function, I asked  that she give him a prescription for 20 syringes of Lovenox 100 mg.  The  patient will take 1 syringe subcutaneously twice daily starting this  evening on Aug 15, 2007.  The patient is only going to begin Lovenox  without Coumadin as he is scheduled with Dr. Dennard Schaumann at [facility]  in May to undergo a knee replacement on Aug 21, 2007, which is  next Wednesday.  The patient will continue on Lovenox 1 syringe subcu  twice daily starting on the evening of Aug 15, 2007 until last dose,  which will be scheduled at later than 8 in the morning on Aug 20, 1998,  allowing 24-hour washout.  The patient has been cautioned in the past  regarding the potential for concerns and complications associated with  this including bleeding complications or clot signs.  Further, the  patient will contact us with questions or problems in the meantime.  I  have spoken with Mrs. Cheatwood on her cell phone at [phone] this  evening at 4:50 p.m.  We have gone over the regimen again.  She has no  further questions and will contact us if there are problems in the  meantime.  She will contact the pharmacy to make sure there are no  issues with the syringe, but all seems to be in order.      Shelby Dubin, PharmD, BCPS, CPP  Electronically  Signed      Bevelyn Buckles. Bensimhon, MD  Electronically Signed   MP/MedQ  DD: 08/15/2007  DT: 08/15/2007  Job #: [id number]   cc:   Hedwig Morton. Juanda Chance, MD

## 2010-08-16 NOTE — Discharge Summary (Signed)
[name], [name] NO.:  [id number]   MEDICAL RECORD NO.:  [id number]          PATIENT TYPE:  INP   LOCATION:  3707                         FACILITY:  [facility]   PHYSICIAN:  Bevelyn Buckles. Bensimhon, MDDATE OF BIRTH:  [birth year]   DATE OF ADMISSION:  09/26/2006  DATE OF DISCHARGE:  09/26/2006                               DISCHARGE SUMMARY   PROCEDURES:  Direct current cardioversion.   PRIMARY DIAGNOSES:  1. Paroxysmal atrial fibrillation.  2. History of atrial flutter, status post ablation at the [facility] in 1997.  3. History of atrial fibrillation, status post ablation at [facility] in November 2007.  4. History of white coat hypertension.  5. Status post adenosine Myoview in October 2007 with an EF of 64% and      no ischemia.  6. History of amiodarone induced hyperthyroidism.  7. Status post right knee surgery in January 2008.  8. Family history of arrhythmia in his mother.  9. Chronic anticoagulation with Coumadin.   Time at discharge 26 minutes.   HOSPITAL COURSE:  Antonio Carrillo is a 70 year old male with a previous  history of atrial flutter and atrial fibrillation, both ablated.  On the  night before admission, he was back into atrial fibrillation.  He states  he had been having some PVCs and skipped beats but he felt his heart  rate become rapid and irregular and knew he was back in atrial  fibrillation.  He came to the emergency room to ensure that an MI was  not the cause.   Because he began having symptoms at approximately 12:30 a.m., he could  be cardioverted without a TEE.  He had been initially scheduled for a  colonoscopy on 09/27/2006 and had been holding his Coumadin, but his INR  was still almost therapeutic at 1.9.  He restarted his Coumadin when the  atrial fib started.  It was felt that he would possibly need flecainide  long-term and was restarted at 100 mg b.i.d.   Mr. Davisson was seen by Dr. Gala Romney on  09/26/2006.  He was felt to be  symptomatic from the atrial fibrillation, and it was felt that  cardioversion was the best option for him.  Dr. Gala Romney cardioverted  him with a biphasic 150 joules shock, and he converted to sinus rhythm.  Postprocedure he was slightly hypotensive with a systolic in the 80s,  but this was before recovering from anesthesia.   Once he recovered from anesthesia, his blood pressure normalized and his  heart rate was within normal limits as well.  Dr. Gala Romney evaluated  Mr. Faria and considered him stable for discharge with outpatient  followup arranged.  Of note, a lipid profile was performed which showed  total cholesterol 166, triglycerides 130, HDL 37, LDL 103.  TSH is  pending.  BNP 140.  A repeat INR at 7:45 on 09/26/2006 was 1.9.   DISCHARGE INSTRUCTIONS:  He has a followup appointment with Dr.  Gala Romney on Friday.  He is to follow up with his  primary care physician  as needed.  He is to get his INR checked on July 15.   DISCHARGE MEDICATIONS:  1. Coumadin 2 mg on Monday, Wednesday, Friday and Sunday and 1 mg      other days.  2. Aspirin 81 mg daily.  3. Avapro 300 mg daily.  4. Methimazole 10 mg daily.  5. Tenormin mg daily.  6. Hydrocodone p.r.n.  7. Flecainide 100 mg b.i.d.      Theodore Demark, PA-C      Bevelyn Buckles. Bensimhon, MD  Electronically Signed    RB/MEDQ  D:  09/26/2006  T:  09/27/2006  Job:  [id number]   cc:   Bevelyn Buckles. Bensimhon, MD

## 2010-08-16 NOTE — Assessment & Plan Note (Signed)
[facility]                            CARDIOLOGY OFFICE NOTE   NAME:Depaula, Antonio Carrillo                         MRN:          [id number]  DATE:10/29/2006                            DOB:          17-Dec-1940    INTERVAL HISTORY:  Josephine is a very pleasant 70 year old male with a  history of atrial fibrillation. He is status post atrial fibrillation  ablation by Dr.  Callie Fielding at [facility]. Unfortunately, several weeks  ago, he had an episode of breakthrough atrial fibrillation after  stopping his flecainide post procedure. He was admitted and underwent  direct current cardioversion. His flecainide was restarted. He has  subsequently seen Dr.  Deno Lunger in followup.   He tells me since restarting the flecainide he has been doing great. He  has no more palpitations and has not noted any recurrent atrial  fibrillation. Dr.  Deno Lunger agreed that at this point it would be most  reasonable to continue the flecainide and not pursue any repeat  ablations unless he had refractory breakthrough atrial fibrillation.   PAST MEDICAL HISTORY:  Is also notable for hyperthyroidism, which he is  followed in the endocrine clinic as well as borderline hyperlipidemia.   CURRENT MEDICATIONS:  1. Coumadin.  2. Flecainide 100 mg b.i.d.  3. Aspirin 81.  4. Avapro 300.  5. Atenolol 50.  6. Methimazole 10 mg a day.   PHYSICAL EXAMINATION:  He is well-appearing in no acute distress. He  ambulates around the clinic without any respiratory difficulty. Blood  pressure 110/78, heart rate 63, weight 235.  HEENT: Normal.  NECK: is supple. No JVD. Carotids are 2+ bilaterally without bruits.  There is no lymphadenopathy or thyromegaly.  CARDIAC: His PMI is nondisplaced. Is a regular rate and a rhythm. No  murmurs, rubs or gallops.  LUNGS:  Are clear.  ABDOMEN: Soft, nontender, nondistended. No hepatosplenomegaly. No  bruits. No masses. Good bowel sounds.  EXTREMITIES: Are warm with no  cyanosis, clubbing or edema. No rash.  NEURO: He is alert and oriented x3. Cranial nerves II-XII are intact.  Moves all 4 extremities without difficulty. Affect is very pleasant.   EKG shows a sinus rhythm with a right bundle branch block at a rate of  63. There is minimal nonspecific ST-T wave changes.   ASSESSMENT/PLAN:  1. Atrial fibrillation, status post previous atrial fibrillation      ablation. I concur with Dr.  Deno Lunger and we will continue the      flecainide. He is followed in the Coumadin Clinic.  2. Hyperlipidemia. LDL is mildly elevated at about 105, I would like      to see it under 100 given his risk factors. He is going to try      modifying his diet and cutting out cheese and will recheck in three      months along with a C-reactive protein.  3. Hypertension, well-controlled.   DISPOSITION:  Return to clinic in 3 months for followup.     Bevelyn Buckles. Bensimhon, MD  Electronically Signed    DRB/MedQ  DD: 10/29/2006  DT: 10/29/2006  Job #: [id number]

## 2010-08-16 NOTE — H&P (Signed)
Antonio Carrillo, Antonio Carrillo NO.:  [id number]   MEDICAL RECORD NO.:  [id number]          PATIENT TYPE:  EMS   LOCATION:  MAJO                         FACILITY:  [facility]   PHYSICIAN:  Unice Cobble, MD     DATE OF BIRTH:  02/26/1941   DATE OF ADMISSION:  09/26/2006  DATE OF DISCHARGE:                              HISTORY & PHYSICAL   CARDIOLOGIST:  Dr. Gala Romney, [facility].   CHIEF COMPLAINT:  Palpitations.   HISTORY OF PRESENT ILLNESS:  This is a 70 year old white male with a  history of atrial flutter and fibrillation, status post ablation x2 (the  latest in November 2007), who presents with palpitations starting at  half past midnight. The patient has been having PVCs and skipped heart  beats since he discontinued his flecainide 1 month ago.  He has also  started an exercise regimen during this time.  Tonight, he definitively  went back into his atrial fibrillation at half past midnight.  No chest  pain or shortness of breath.  No presyncope or symptoms of congestive  heart failure.  No extremely high heart rates.  He came to the emergency  department to ensure myocardial infarction was not the cause.   PAST MEDICAL HISTORY:  1. Status post atrial flutter ablation at the [facility] [place] [facility] in      1997.  2. Status post atrial fibrillation ablation at [facility] on February 01, 2006.  3. White coat hypertension.  4. Adenosine Myoview in October 2007 showing an ejection fraction of      64% and no inducible myocardial ischemia.  5. Hyperthyroidism, amiodarone induced.  6. Status post knee surgery on the right knee in January 2008.   ALLERGIES:  No known drug allergies.   MEDICATIONS:  1. Coumadin 2 mg Monday/Wednesday/Friday/Sunday and 1 mg other days.  2. Aspirin 81 mg daily.  3. Avapro 300 mg daily.  4. Hydrocodone/Tylenol 10/500 mg p.r.n. knee pain.  5. Methimazole 10 mg daily.  6. Tenormin 50 mg daily.   SOCIAL HISTORY:  He lives in [place]  with his wife.  He currently  works at home.  He does not smoke, drink or use drugs.  No caffeine use.   FAMILY HISTORY:  His mother had an arrhythmia.  His father died of  leukemia.   REVIEW OF SYSTEMS:  Complete review of systems was done and found to be  otherwise negative except as stated in the HPI.   PHYSICAL EXAMINATION:  VITAL SIGNS:  Temperature 97.7 with a pulse of  114, respiratory rate of 18 and blood pressure 153/90 with an O2 SAT of  97% on 2 L.  GENERAL:  He is in no acute distress.  HEENT:  PERRLA, EOMI.  MMM.  NECK:  Supple without lymphadenopathy, thyromegaly, bruits or  jugulovenous distention.  HEART:  Irregular-irregular rhythm.  No murmurs, gallops or rubs.  LUNGS:  Clear to auscultation bilaterally.  ABDOMEN:  Soft and nontender with normal bowel sounds and no rebound or  guarding.  Negative hepatosplenomegaly.  EXTREMITIES:  Warm  and well-perfused without cyanosis, clubbing or  edema.  NEUROLOGIC:  He is alert and oriented x3.  Cranial nerves II-XII intact.  Strength and sensation are normal in all extremities.   LABORATORY AND ACCESSORY CLINICAL DATA:  EKG shows a rate of 116 with a  rhythm of atrial fibrillation.  He has a new right bundle branch block  with anterior ST depression with T-wave inversion.  He has a Q wave in  lead III that is old.   CBC and basic metabolic panel are normal.  CK-MB and troponin are less  than detectable.  INR is 1.9.   ASSESSMENT AND PLAN:  This is a 70 year old white male with atrial  fibrillation.  His heart rate is not particularly elevated at this time  and he is stable hemodynamically.  As he went into this rhythm at half  past midnight today, he could be direct-current cardioverted later today  likely without transesophageal echocardiogram.  His INR is 1.9 secondary  to stopping Coumadin 2 days prior for a colonoscopy which was planned  for tomorrow.  Prior to this, his INR was within normal limits.  He did  take 2  mg of Coumadin tonight when his atrial fibrillation started.  Likely, this gentleman will need flecainide long-term, once he is direct-  current cardioverted and if this fails, another ablation attempt would  need to be considered.  I will repeat an echocardiogram this morning as  he has not had one since his ablation, to see if there is any change in  heart function or structure.  TSH will be checked.  Morning lipids will  be checked per patient request.      Unice Cobble, MD  Electronically Signed     ACJ/MEDQ  D:  09/26/2006  T:  09/26/2006  Job:  [id number]

## 2010-08-17 ENCOUNTER — Encounter: Payer: Self-pay | Admitting: Internal Medicine

## 2010-08-17 ENCOUNTER — Ambulatory Visit: Payer: Self-pay | Admitting: Internal Medicine

## 2010-08-17 LAB — POCT INR: INR: 2.3

## 2010-08-17 LAB — PROTIME-INR

## 2010-08-19 NOTE — Assessment & Plan Note (Signed)
[facility] OFFICE NOTE   NAME:Turton, Antonio Carrillo                         MRN:          [id number]  DATE:02/06/2006                            DOB:          1940-05-02    PATIENT IDENTIFICATION:  Antonio Carrillo is a very pleasant 70 year old male who  returns for routine followup.   PAST MEDICAL HISTORY:  1. Atrial fibrillation/flutter diagnosed in the 1970s.      a.     Previously on multiple antiarrhythmics including most recent       amiodarone.      b.     Status post atrial flutter ablation at the [facility]       in 1997.      c.     Status post atrial fibrillation ablation by Dr. Callie Fielding at       [facility] on November 1.  2. Hypertension, predominantly white coat.  3. Adenosine Myoview, October 2007, EF 64%, no ischemia.  4. Hyperthyroidism, managed by Dr. Everardo All.   CURRENT MEDICATIONS:  1. Coumadin.  2. Flecainide 100 mg b.i.d.  3. Aspirin 81 mg.  4. Avapro 150 mg.  5. Atenolol 50 mg.  6. Cardizem CD 120 mg a day.  7. Methimazole 10 mg a day.   INTERVAL HISTORY:  Mr. Antonio Carrillo returns today for routine followup.  He  underwent atrial fibrillation ablation with Dr. Deno Lunger several days ago.  Since that time, he has had 4 episodes of recurrent atrial fibrillation, the  longest one lasting 2 hours.  He is currently in sinus rhythm.  He also  notes that during his discharge he gained 15 pounds of fluid and he feels  quite swollen and distended now.  He denies any orthopnea or PND.  He does  have some mild shortness of breath on exertion as well as some palpitations.   PHYSICAL EXAM:  He is well-appearing, in no acute distress, ambulates around  the clinic without respiratory difficulty.  Blood pressure is 140/60.  Heart rate is 64.  HEENT:  Sclerae are anicteric.  EOMI.  There are no xanthelasmas.  Mucous  membranes are moist.  NECK:  Supple.  JVP is elevated to about 10 cmH20.  Carotids are  2+  bilaterally without any bruits.  There is no lymphadenopathy or thyromegaly.  CARDIAC:  Regular rate and rhythm.  No murmurs, rubs, or gallops.  LUNGS:  Clear.  ABDOMEN:  Mildly distended and nontender.  No hepatosplenomegaly.  No  bruits.  No masses.  Good bowel sounds.  EXTREMITIES:  Warm with 2 to 3+ edema and no cyanosis or clubbing.  No  rashes.  His left groin area has mild ecchymosis and a tiny hematoma, but no  bruits.  NEUROLOGIC:  He is alert and oriented x3.  Cranial nerves II-XII are intact.  He moves all 4 extremities without difficulty.   LABORATORY AND ACCESSORY CLINICAL DATA:  EKG shows sinus rhythm with  occasional PAC and a right bundle branch block, rate of 64.   ASSESSMENT AND PLAN:  1.  Atrial fibrillation, status post atrial fibrillation ablation:  He has      had some breakthrough atrial fibrillation; however, we are watching      this conservatively.  He will follow up with Dr. Deno Lunger in January.      Continue flecainide and Coumadin.  2. Volume overload:  I suspect this is secondary to his procedure and      fluid loading, although he does not remember getting extensive amounts      of intravenous fluid.  He did have a postoperative echocardiogram which      did not show any significant pericardial effusion and there is no      evidence of Kussmaul's sign on exam and thus I think significant      pericardial effusion is less likely.  I have given him a prescription      for Lasix 40 mg a day along with some potassium.  He will get a 2-D      echocardiogram this week.  3. Hyperthyroidism:  He will follow up with Dr. Everardo All.   DISPOSITION:  He will return to see me in a month's time for followup.     Bevelyn Buckles. Bensimhon, MD  Electronically Signed    DRB/MedQ  DD: 02/06/2006  DT: 02/07/2006  Job #: [id number]

## 2010-08-19 NOTE — Consult Note (Signed)
[facility]                            ENDOCRINOLOGY CONSULTATION   NAME:Carrillo, Antonio DENTE                         MRN:          [id number]  DATE:11/06/2005                            DOB:          04-Dec-1940    REASON FOR VISIT:  Follow up thyroid.   HISTORY OF PRESENT ILLNESS:  70 year old man who stopped his Tapazole due to  a headache.   Regarding his atrial fibrillation, he is still on amiodarone, but he is  hoping that this could be discontinued after he has an ablation in the next  month or two.   He was also found to have a low testosterone level, and he continues to  suffer from ED.   PAST MEDICAL HISTORY:  Otherwise healthy.   MEDICATIONS:  Amiodarone, Avapro, atenolol and Coumadin.   REVIEW OF SYSTEMS:  Denies any change in his weight, denies loss of  consciousness.   PHYSICAL EXAMINATION:  VITAL SIGNS:  Blood pressure 131/76, heart rate 54,  temperature is 97, the weight is 225.  GENERAL:  In no distress.  NECK:  I do not appreciate a goiter.  SKIN:  Not diaphoretic.  NEUROLOGIC:  No tremor.  He does not appear anxious nor depressed.   LABORATORY STUDIES:  On November 06, 2005, TSH 0.01.  Free T4 is still elevated  at 1.8.  Testosterone level on October 05, 2005, is 182.  FSH and LH are  elevated.  Thyroid ultrasound on October 06, 2005, is normal except for minimally diffuse  thyroid enlargement.   IMPRESSION:  1.  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  2.  History of intolerance to Tapazole.  3.  Atrial fibrillation, for which he hopes to get ablation therapy at [facility]      soon.  4.  Primary testicular failure.   PLAN:  1.  Start PTU 200 mg twice daily.  2.  Testim 5 grams daily.  3.  Return in 30 days.  4.  Continue to follow up with cardiology for your atrial fibrillation.                                   Sean A. Everardo All, MD   SAE/MedQ  DD:  11/07/2005  DT:  11/08/2005  Job #:  [id number]   cc:   Bevelyn Buckles. Bensimhon, MD  Ignatius Specking, M.D.

## 2010-08-19 NOTE — Op Note (Signed)
NAMEKLEBER, CREAN NO.:  [id number]   MEDICAL RECORD NO.:  [id number]          PATIENT TYPE:  OIB   LOCATION:  2899                         FACILITY:  [facility]   PHYSICIAN:  Arvilla Meres, M.D. LHCDATE OF BIRTH:  1940/04/05   DATE OF PROCEDURE:  08/23/2005  DATE OF DISCHARGE:                                 OPERATIVE REPORT   PROCEDURE:  Direct current cardioversion.   PATIENT IDENTIFICATION:  Antonio Carrillo is a very pleasant 70 year old male with  a long history of paroxysmal atrial fibrillation.  He has been maintained on  normal sinus rhythm on amiodarone for the past 10 years.  He was recently  started on Coumadin about four weeks ago.  His INR has been somewhat erratic  over that time.  Most recently it was 5.3 this morning.  He called to tell  me that yesterday morning about 3 a.m. he went into atrial fibrillation  which was fairly symptomatic for him.  He had previously been in normal  sinus rhythm.  Given his symptoms, he was scheduled for direct current  cardioversion.   DESCRIPTION OF PROCEDURE:  The risks and benefits of the procedure were  explained, including the risk of thromboembolic complications.  Given that  his atrial fibrillation was less than 40 hours in duration, it was thought  the risk of these complications was low.  He was sedated with 175 mg of  sodium Pentothal by anesthesia.  He then received a single synchronized 175  joule biphasic shock with prompt conversion back to normal sinus rhythm.  EKG is currently pending.  There were no apparent complications.      Arvilla Meres, M.D. [facility]  Electronically Signed     DB/MEDQ  D:  08/23/2005  T:  08/23/2005  Job:  [id number]

## 2010-08-19 NOTE — Letter (Signed)
November 06, 2005     Antonio Buckles. Bensimhon, MD  [address], [address]  [place], [place] [address]   RE:  Antonio Carrillo, Antonio Carrillo  MRN:  [id number]  /  DOB:  1940/12/18   Dear Antonio Carrillo:   Thank you so much for sending Antonio Carrillo for consideration of colorectal  screening for colon polyps.  He is a very nice 70 year old gentleman who has  chronic atrial fibrillation and is scheduled for ablation at [facility] on August  20.  He is currently on Coumadin 1 mg a day with INR switching between 2 to  3.  He had a colonoscopy about 3 years ago in [place], [place].  Colonoscopy  was incomplete.  He was told he had a large, redundant colon.  A small polyp  was found, I assume in the left colon, and it was benign.  We do not have  the pathology report, but the patient was told to have a repeat colonoscopy  in 2 years.   Antonio Carrillo has no GI symptoms other than symptomatic hemorrhoids, which  bother him only intermittently.  He has regular bowel movements and has no  family history of colon cancer.   MEDICATIONS:  1.  Amiodarone 200 mg p.o. daily.  2.  Avapro 300 mg p.o. daily.  3.  Tenormin 50 mg p.o. daily.  4.  Coumadin 1 mg p.o. daily.   PAST HISTORY:  High blood pressure, __________ complaints.   OPERATIONS:  Appendectomy.   FAMILY HISTORY:  Not pertinent.   SOCIAL HISTORY:  He is married with 3 children.  Works as a Corporate investment banker.  He does not smoke and does not drink alcohol.   REVIEW OF SYSTEMS:  Positive for arthritic complaints.   PHYSICAL EXAMINATION:  VITAL SIGNS:  Blood pressure 110/70, pulse 60 and  weight 225 pounds.  LUNGS:  Clear to auscultation.  CARDIAC:  Normal S1, normal S2.  ABDOMEN:  Soft, nontender, with liver edge at costal margin.  A right lower  quadrant post-appendectomy scar.  RECTAL:  Not done today.  He apparently had a complete physical exam at  [facility] within the last 6 weeks, including heme stool cards, which were  negative.  EXTREMITIES:   No edema.   IMPRESSION:  Antonio Carrillo is a good candidate for follow-up colonoscopy.  He  had one incomplete colonoscopy with finding of a benign polyp.  He is  currently anticoagulated and will need to be taken off the Coumadin for 5  days prior to the procedure.   We will plan for him to take care of the atrial fibrillation and ablation at  [facility] first, and I would appreciate it if you would let me know when it would  be safe for him to get off the Coumadin for 5 days to do his colonoscopy.  I  assume this would be sometime in the fall of 2007.  He could potentially be  off Coumadin as long as 3 weeks in case we find a colon polyp.  There is no  rush for his colonoscopy, and I have discussed this with him as well as the  prep and the procedure itself.  We have instructed Antonio Carrillo in colonoscopy  and the preparation today so he would need to come back but either only call  us when he is ready have his colonoscopy, probably in September or October  of this year.   Thank you so much for letting me share in  his care.    Sincerely,      Hedwig Morton. Juanda Chance, MD   DMB/MedQ  DD:  11/06/2005  DT:  11/06/2005  Job #:  [id number]

## 2010-08-22 ENCOUNTER — Ambulatory Visit: Payer: Self-pay | Admitting: Internal Medicine

## 2010-08-24 ENCOUNTER — Ambulatory Visit: Payer: Self-pay | Admitting: Internal Medicine

## 2010-08-24 ENCOUNTER — Encounter: Payer: Self-pay | Admitting: Internal Medicine

## 2010-08-24 LAB — POCT INR: INR: 2.1

## 2010-08-30 ENCOUNTER — Ambulatory Visit: Payer: Self-pay | Admitting: Internal Medicine

## 2010-08-30 ENCOUNTER — Other Ambulatory Visit (HOSPITAL_COMMUNITY): Payer: Self-pay | Admitting: Radiology

## 2010-08-31 LAB — PROTIME-INR

## 2010-09-02 ENCOUNTER — Ambulatory Visit (INDEPENDENT_AMBULATORY_CARE_PROVIDER_SITE_OTHER): Payer: Self-pay | Admitting: Cardiology

## 2010-09-02 ENCOUNTER — Encounter: Payer: Self-pay | Admitting: Cardiology

## 2010-09-02 DIAGNOSIS — R0989 Other specified symptoms and signs involving the circulatory and respiratory systems: Secondary | ICD-10-CM

## 2010-09-07 ENCOUNTER — Ambulatory Visit (INDEPENDENT_AMBULATORY_CARE_PROVIDER_SITE_OTHER): Payer: Self-pay | Admitting: Internal Medicine

## 2010-09-07 ENCOUNTER — Encounter: Payer: Self-pay | Admitting: Internal Medicine

## 2010-09-07 DIAGNOSIS — R0989 Other specified symptoms and signs involving the circulatory and respiratory systems: Secondary | ICD-10-CM

## 2010-09-21 ENCOUNTER — Ambulatory Visit (INDEPENDENT_AMBULATORY_CARE_PROVIDER_SITE_OTHER): Payer: Self-pay | Admitting: Internal Medicine

## 2010-09-21 DIAGNOSIS — R0989 Other specified symptoms and signs involving the circulatory and respiratory systems: Secondary | ICD-10-CM

## 2010-09-27 ENCOUNTER — Other Ambulatory Visit (HOSPITAL_COMMUNITY): Payer: Self-pay | Admitting: Radiology

## 2010-09-28 ENCOUNTER — Ambulatory Visit: Payer: Self-pay | Admitting: Internal Medicine

## 2010-10-06 ENCOUNTER — Ambulatory Visit (INDEPENDENT_AMBULATORY_CARE_PROVIDER_SITE_OTHER): Payer: Self-pay | Admitting: Internal Medicine

## 2010-10-06 DIAGNOSIS — R0989 Other specified symptoms and signs involving the circulatory and respiratory systems: Secondary | ICD-10-CM

## 2010-10-13 ENCOUNTER — Other Ambulatory Visit (HOSPITAL_COMMUNITY): Payer: Self-pay | Admitting: Internal Medicine

## 2010-10-13 DIAGNOSIS — I4891 Unspecified atrial fibrillation: Secondary | ICD-10-CM

## 2010-10-17 ENCOUNTER — Other Ambulatory Visit: Payer: Self-pay | Admitting: Internal Medicine

## 2010-10-18 ENCOUNTER — Ambulatory Visit (HOSPITAL_COMMUNITY): Payer: Medicare Other | Attending: Endocrinology | Admitting: Radiology

## 2010-10-18 DIAGNOSIS — I712 Thoracic aortic aneurysm, without rupture, unspecified: Secondary | ICD-10-CM | POA: Insufficient documentation

## 2010-10-18 DIAGNOSIS — I079 Rheumatic tricuspid valve disease, unspecified: Secondary | ICD-10-CM | POA: Insufficient documentation

## 2010-10-18 DIAGNOSIS — I379 Nonrheumatic pulmonary valve disorder, unspecified: Secondary | ICD-10-CM | POA: Insufficient documentation

## 2010-10-18 DIAGNOSIS — I4891 Unspecified atrial fibrillation: Secondary | ICD-10-CM

## 2010-10-18 DIAGNOSIS — E785 Hyperlipidemia, unspecified: Secondary | ICD-10-CM | POA: Insufficient documentation

## 2010-10-18 DIAGNOSIS — I059 Rheumatic mitral valve disease, unspecified: Secondary | ICD-10-CM | POA: Insufficient documentation

## 2010-10-18 DIAGNOSIS — I10 Essential (primary) hypertension: Secondary | ICD-10-CM | POA: Insufficient documentation

## 2010-10-19 LAB — POCT INR: INR: 2.7

## 2010-10-20 ENCOUNTER — Telehealth: Payer: Self-pay | Admitting: Internal Medicine

## 2010-10-20 ENCOUNTER — Encounter: Payer: Self-pay | Admitting: Internal Medicine

## 2010-10-20 MED ORDER — LOSARTAN POTASSIUM 100 MG PO TABS: 100.0000 mg | ORAL_TABLET | Freq: Every day | ORAL | Status: DC

## 2010-10-20 NOTE — Telephone Encounter (Signed)
Pt aware rx sent in. 

## 2010-10-20 NOTE — Telephone Encounter (Signed)
Pt requesting refill of losartan 100 mg uses [facility] [address] in [place]

## 2010-10-20 NOTE — Telephone Encounter (Signed)
Discussed echo results with the pt, he ask about dimensions of ascending aortic aneurysm however # aren't listed it only states "moderately dilated" will have Dr Gala Romney review when he returns to town on 7/30

## 2010-10-20 NOTE — Telephone Encounter (Signed)
Pt calling re echo results. °

## 2010-10-21 NOTE — Telephone Encounter (Signed)
Left message to call back  

## 2010-10-21 NOTE — Telephone Encounter (Signed)
The AO root is 4.4 cm and the ascending Ao is 5.0cm. This is bigger than the report in 2011 but only a little change since 2010 (suspect 2011 - undermeasured). Has he been seeing the vascular or cardiothoracic surgery guys? If not, please have him see Durene Cal. It is not big enough for surgery but nice to get him plugged in.

## 2010-10-24 ENCOUNTER — Ambulatory Visit: Payer: Self-pay | Admitting: Internal Medicine

## 2010-10-26 ENCOUNTER — Ambulatory Visit (INDEPENDENT_AMBULATORY_CARE_PROVIDER_SITE_OTHER): Payer: Self-pay | Admitting: Cardiovascular Disease

## 2010-10-26 DIAGNOSIS — R0989 Other specified symptoms and signs involving the circulatory and respiratory systems: Secondary | ICD-10-CM

## 2010-10-31 ENCOUNTER — Ambulatory Visit (INDEPENDENT_AMBULATORY_CARE_PROVIDER_SITE_OTHER): Payer: Medicare Other | Admitting: Internal Medicine

## 2010-10-31 ENCOUNTER — Encounter: Payer: Self-pay | Admitting: Internal Medicine

## 2010-10-31 VITALS — BP 164/92 | HR 73 | Ht 75.0 in

## 2010-10-31 DIAGNOSIS — E785 Hyperlipidemia, unspecified: Secondary | ICD-10-CM

## 2010-10-31 DIAGNOSIS — I739 Peripheral vascular disease, unspecified: Secondary | ICD-10-CM

## 2010-10-31 DIAGNOSIS — I712 Thoracic aortic aneurysm, without rupture: Secondary | ICD-10-CM

## 2010-10-31 DIAGNOSIS — I4891 Unspecified atrial fibrillation: Secondary | ICD-10-CM

## 2010-10-31 MED ORDER — AMIODARONE HCL 400 MG PO TABS: 200.0000 mg | ORAL_TABLET | Freq: Every day | ORAL | Status: DC

## 2010-10-31 MED ORDER — LOVASTATIN 20 MG PO TABS: 20.0000 mg | ORAL_TABLET | Freq: Every day | ORAL | Status: DC

## 2010-10-31 MED ORDER — AMLODIPINE BESYLATE 2.5 MG PO TABS: 2.5000 mg | ORAL_TABLET | Freq: Every day | ORAL | Status: DC

## 2010-10-31 MED ORDER — LOSARTAN POTASSIUM 100 MG PO TABS: 100.0000 mg | ORAL_TABLET | Freq: Every day | ORAL | Status: DC

## 2010-10-31 NOTE — Assessment & Plan Note (Signed)
Doing well on low-dose amio. Continue current regimen and anticoagulation. Due for amio surveillance labs.

## 2010-10-31 NOTE — Assessment & Plan Note (Signed)
Moderately dialted. i have reviewed all his echos and feel this is table since 2007. Will refer to Dr. Myra Gianotti for initial evaluation.

## 2010-10-31 NOTE — Assessment & Plan Note (Signed)
Due for recheck of lipids and liver panel.

## 2010-10-31 NOTE — Assessment & Plan Note (Signed)
L DP markedly diminished but no claudication. Will check ABIs.

## 2010-10-31 NOTE — Progress Notes (Signed)
HPI:    Antonio Carrillo is a very pleasant 70 yo WM with a history of recurrent atrial fibrillation, HTN, HL and ascending Ao aneurysm.  He was diagnosed with atrial fibrillation in 1977 after presenting with symptoms of fatigue and decreased exercise tolerance.  He reports increasing frequency and duration of atrial fibrillation since that time.  He failed medical therapy with flecainide, rhythmol and most recently multaq.  He reports that he was not tried on [place] or sotalol due to prolonged QT.  He underwent atrial flutter ablation in [place] late 1990s.  Atrial fibrillation ablation was performed at [facility] 02/01/2006 and then repeat ablation was done by Dr. Johney Frame in 01/2010. Was on amiodarone 100 once daily until July. Had breakthrough in 10/11 and 12/11 and had DC-CV. Amiodarone restarted qd.   Feels good. Exercising on elliptical 5-6x/week without problems. No further AF. No CP or palpitations. No edema, orthopnea or PND.   Recent echo normal EF. Ao Root 44 asc ao 50mm (stable since 2007)   ROS: All systems negative except as listed in HPI, PMH and Problem List.  Past Medical History  Diagnosis Date  . Anticoagulants causing adverse effect in therapeutic use   . Atrial fib/flutter, transient     s/p ablations x3  . Hyperthyroidism     resolved per pt  . Testicular failure   . Aortic aneurysm     4.7cm  stable in size    Current Outpatient Prescriptions  Medication Sig Dispense Refill  . amiodarone (PACERONE) 400 MG tablet Take 200 mg by mouth daily.        Marland Kitchen amLODipine (NORVASC) 2.5 MG tablet Take 2.5 mg by mouth daily.        Marland Kitchen aspirin 81 MG tablet Take 81 mg by mouth daily.        . Cholecalciferol (VITAMIN D) 1000 UNITS capsule Take 1,000 Units by mouth daily.        . fish oil-omega-3 fatty acids 1000 MG capsule Take by mouth daily.        Marland Kitchen losartan (COZAAR) 100 MG tablet Take 1 tablet (100 mg total) by mouth daily.  30 tablet  6  . lovastatin (MEVACOR) 20 MG tablet Take 20 mg by  mouth at bedtime.        Marland Kitchen PREVIDENT 5000 BOOSTER 1.1 % PSTE As directed      . vitamin B-12 (CYANOCOBALAMIN) 1000 MCG tablet Take 1,000 mcg by mouth daily.        Marland Kitchen warfarin (COUMADIN) 1 MG tablet Take 1 mg by mouth as directed.        . warfarin (COUMADIN) 2 MG tablet TAKE AS DIRECTED BY COUMADIN CLINIC  35 tablet  2     PHYSICAL EXAM: Filed Vitals:   10/31/10 1259  BP: 164/92  Pulse: 73   General:  Well appearing. No resp difficulty HEENT: normal Neck: supple. JVP flat. Carotids 2+ bilaterally; no bruits. No lymphadenopathy or thryomegaly appreciated. Cor: PMI normal. Regular rate & rhythm. No rubs, gallops or murmurs. Lungs: clear Abdomen: soft, nontender, nondistended. No hepatosplenomegaly. No bruits or masses. Good bowel sounds. Extremities: no cyanosis, clubbing, rash, edema. R DP 2+ L DP faintly palpable Neuro: alert & orientedx3, cranial nerves grossly intact. Moves all 4 extremities w/o difficulty. Affect pleasant.    ECG: SR with PACs 73 RBBB. Diffuse ST depression (chronic)   ASSESSMENT & PLAN:

## 2010-10-31 NOTE — Patient Instructions (Signed)
Your physician recommends that you return for a FASTING lipid profile, liver, bmet, cbc, tsh, free T4 (427.31, 272.4)  Your physician has requested that you have a lower extremity arterial exercise duplex. During this test, exercise and ultrasound are used to evaluate arterial blood flow in the legs. Allow one hour for this exam. There are no restrictions or special instructions.  You have been referred to Dr Myra Gianotti  Your physician wants you to follow-up in: 6 months.  You will receive a reminder letter in the mail two months in advance. If you don't receive a letter, please call our office to schedule the follow-up appointment.

## 2010-11-01 ENCOUNTER — Other Ambulatory Visit: Payer: Self-pay | Admitting: Surgery

## 2010-11-01 DIAGNOSIS — IMO0001 Reserved for inherently not codable concepts without codable children: Secondary | ICD-10-CM

## 2010-11-01 NOTE — Telephone Encounter (Signed)
Pt aware and referral made at [facility] 7/30

## 2010-11-03 ENCOUNTER — Ambulatory Visit (INDEPENDENT_AMBULATORY_CARE_PROVIDER_SITE_OTHER): Payer: Medicare Other | Admitting: Internal Medicine

## 2010-11-03 DIAGNOSIS — I4891 Unspecified atrial fibrillation: Secondary | ICD-10-CM

## 2010-11-04 ENCOUNTER — Telehealth: Payer: Self-pay | Admitting: Internal Medicine

## 2010-11-04 ENCOUNTER — Other Ambulatory Visit: Payer: Self-pay | Admitting: Internal Medicine

## 2010-11-04 LAB — HEPATIC FUNCTION PANEL
ALT: 24 U/L (ref 0–53)
AST: 23 U/L (ref 0–37)
Albumin: 4.3 g/dL (ref 3.5–5.2)
Alkaline Phosphatase: 72 U/L (ref 39–117)
Bilirubin, Direct: 0.2 mg/dL (ref 0.0–0.3)
Indirect Bilirubin: 0.5 mg/dL (ref 0.0–0.9)
Total Bilirubin: 0.7 mg/dL (ref 0.3–1.2)
Total Protein: 6.6 g/dL (ref 6.0–8.3)

## 2010-11-04 LAB — CBC
MCHC: 32.1 g/dL (ref 30.0–36.0)
Platelets: 219 10*3/uL (ref 150–400)
RDW: 13.8 % (ref 11.5–15.5)
WBC: 5.8 10*3/uL (ref 4.0–10.5)

## 2010-11-04 LAB — LIPID PANEL
Cholesterol: 141 mg/dL (ref 0–200)
HDL: 38 mg/dL — ABNORMAL LOW (ref >39)
LDL Cholesterol: 80 mg/dL (ref 0–99)
Total CHOL/HDL Ratio: 3.7 Ratio
Triglycerides: 116 mg/dL (ref <150)
VLDL: 23 mg/dL (ref 0–40)

## 2010-11-04 LAB — BASIC METABOLIC PANEL
BUN: 18 mg/dL (ref 6–23)
CO2: 28 mEq/L (ref 19–32)
Calcium: 9.4 mg/dL (ref 8.4–10.5)
Chloride: 101 mEq/L (ref 96–112)
Creat: 0.79 mg/dL (ref 0.50–1.35)
Glucose, Bld: 108 mg/dL — ABNORMAL HIGH (ref 70–99)
Potassium: 4.5 mEq/L (ref 3.5–5.3)
Sodium: 138 mEq/L (ref 135–145)

## 2010-11-04 LAB — T4, FREE: Free T4: 1.44 ng/dL (ref 0.80–1.80)

## 2010-11-04 LAB — TSH: TSH: 2.758 u[IU]/mL (ref 0.350–4.500)

## 2010-11-04 NOTE — Telephone Encounter (Signed)
Pt rtn call to [facility] regarding CT imaging he is going to have.

## 2010-11-04 NOTE — Telephone Encounter (Signed)
Left message for pt to call back  °

## 2010-11-07 ENCOUNTER — Encounter: Payer: Self-pay | Admitting: Internal Medicine

## 2010-11-07 NOTE — Telephone Encounter (Signed)
LMTCB ./CY 

## 2010-11-08 ENCOUNTER — Ambulatory Visit (INDEPENDENT_AMBULATORY_CARE_PROVIDER_SITE_OTHER): Payer: Self-pay | Admitting: Cardiology

## 2010-11-08 ENCOUNTER — Encounter (HOSPITAL_COMMUNITY): Payer: Self-pay | Admitting: *Deleted

## 2010-11-08 DIAGNOSIS — R0989 Other specified symptoms and signs involving the circulatory and respiratory systems: Secondary | ICD-10-CM

## 2010-11-08 LAB — POCT INR: INR: 3.2

## 2010-11-08 NOTE — Telephone Encounter (Signed)
LMOM for call back if he still has a question.

## 2010-11-08 NOTE — Telephone Encounter (Signed)
Triage poole down- routing to Victorio Palm. & Annice Pih

## 2010-11-14 NOTE — Telephone Encounter (Signed)
Copy of labs mailed to pt., 7/30 losartan refill was sent to [facility] with # 90 and 3 refills

## 2010-11-14 NOTE — Telephone Encounter (Signed)
Pt need the rest of his blood test & also send him a copy.

## 2010-11-15 ENCOUNTER — Telehealth: Payer: Self-pay | Admitting: Internal Medicine

## 2010-11-15 ENCOUNTER — Ambulatory Visit (INDEPENDENT_AMBULATORY_CARE_PROVIDER_SITE_OTHER): Payer: Self-pay | Admitting: Cardiology

## 2010-11-15 DIAGNOSIS — R0989 Other specified symptoms and signs involving the circulatory and respiratory systems: Secondary | ICD-10-CM

## 2010-11-15 LAB — POCT INR: INR: 3.1

## 2010-11-15 NOTE — Telephone Encounter (Signed)
Pt want to know if blood sugar was included in his lab work.

## 2010-11-15 NOTE — Telephone Encounter (Signed)
Pt aware of lab results and that copy was mailed to him yest

## 2010-11-15 NOTE — Telephone Encounter (Signed)
Per pt call. Pt said he called yesterday wanting to know the results of his blood test. Please return pt call to advise/discuss.

## 2010-11-17 ENCOUNTER — Encounter: Payer: Medicare Other | Admitting: Cardiology

## 2010-11-21 ENCOUNTER — Ambulatory Visit: Payer: Self-pay | Admitting: Internal Medicine

## 2010-11-22 ENCOUNTER — Ambulatory Visit (INDEPENDENT_AMBULATORY_CARE_PROVIDER_SITE_OTHER): Payer: Self-pay | Admitting: Internal Medicine

## 2010-11-22 DIAGNOSIS — R0989 Other specified symptoms and signs involving the circulatory and respiratory systems: Secondary | ICD-10-CM

## 2010-11-22 LAB — POCT INR: INR: 3

## 2010-11-28 ENCOUNTER — Other Ambulatory Visit: Payer: Medicare Other

## 2010-11-28 ENCOUNTER — Encounter: Payer: Medicare Other | Admitting: Surgery

## 2010-11-29 ENCOUNTER — Ambulatory Visit (INDEPENDENT_AMBULATORY_CARE_PROVIDER_SITE_OTHER): Payer: Self-pay | Admitting: Cardiology

## 2010-11-29 DIAGNOSIS — R0989 Other specified symptoms and signs involving the circulatory and respiratory systems: Secondary | ICD-10-CM

## 2010-12-06 ENCOUNTER — Telehealth: Payer: Self-pay | Admitting: Internal Medicine

## 2010-12-06 NOTE — Telephone Encounter (Signed)
FYI. Pt cancel his lower arterial  doppler test on 9/5 . Patient states he will call back to reschedule . No reason given.

## 2010-12-07 ENCOUNTER — Encounter: Payer: Medicare Other | Admitting: Cardiology

## 2010-12-11 ENCOUNTER — Other Ambulatory Visit: Payer: Self-pay | Admitting: Internal Medicine

## 2010-12-13 ENCOUNTER — Ambulatory Visit (INDEPENDENT_AMBULATORY_CARE_PROVIDER_SITE_OTHER): Payer: Self-pay | Admitting: Cardiovascular Disease

## 2010-12-13 DIAGNOSIS — R0989 Other specified symptoms and signs involving the circulatory and respiratory systems: Secondary | ICD-10-CM

## 2010-12-13 LAB — POCT INR: INR: 2.4

## 2010-12-14 ENCOUNTER — Ambulatory Visit: Payer: Self-pay | Admitting: Internal Medicine

## 2010-12-27 ENCOUNTER — Ambulatory Visit (INDEPENDENT_AMBULATORY_CARE_PROVIDER_SITE_OTHER): Payer: Self-pay | Admitting: Cardiology

## 2010-12-27 LAB — POCT INR: INR: 2.5

## 2011-01-05 ENCOUNTER — Ambulatory Visit: Payer: Self-pay | Admitting: Internal Medicine

## 2011-01-10 ENCOUNTER — Ambulatory Visit (INDEPENDENT_AMBULATORY_CARE_PROVIDER_SITE_OTHER): Payer: Self-pay | Admitting: Internal Medicine

## 2011-01-10 DIAGNOSIS — R0989 Other specified symptoms and signs involving the circulatory and respiratory systems: Secondary | ICD-10-CM

## 2011-01-10 LAB — POCT INR: INR: 2.3

## 2011-01-18 LAB — I-STAT 8, (EC8 V) (CONVERTED LAB)
Acid-base deficit: 1
Chloride: 103
HCT: 42
Operator id: 277751
TCO2: 28
pCO2, Ven: 53.3 — ABNORMAL HIGH

## 2011-01-18 LAB — DIFFERENTIAL
Basophils Absolute: 0
Basophils Relative: 1
Lymphocytes Relative: 40
Neutro Abs: 3.3
Neutrophils Relative %: 42 — ABNORMAL LOW

## 2011-01-18 LAB — LIPID PANEL
Cholesterol: 166
Total CHOL/HDL Ratio: 4.5
VLDL: 26

## 2011-01-18 LAB — POCT I-STAT CREATININE: Operator id: 277751

## 2011-01-18 LAB — APTT: aPTT: 42 — ABNORMAL HIGH

## 2011-01-18 LAB — TSH: TSH: 8.336 — ABNORMAL HIGH

## 2011-01-18 LAB — PROTIME-INR
INR: 1.9 — ABNORMAL HIGH
INR: 1.9 — ABNORMAL HIGH
Prothrombin Time: 22.9 — ABNORMAL HIGH

## 2011-01-18 LAB — CK TOTAL AND CKMB (NOT AT ARMC)
CK, MB: 2.7
Relative Index: INVALID
Total CK: 91

## 2011-01-18 LAB — CBC
MCHC: 33.5
RBC: 4.27
RDW: 13.2

## 2011-01-18 LAB — POCT CARDIAC MARKERS
CKMB, poc: 1.5
Troponin i, poc: <0.05

## 2011-01-24 ENCOUNTER — Ambulatory Visit (INDEPENDENT_AMBULATORY_CARE_PROVIDER_SITE_OTHER): Payer: Self-pay | Admitting: Internal Medicine

## 2011-01-24 DIAGNOSIS — I4891 Unspecified atrial fibrillation: Secondary | ICD-10-CM

## 2011-01-24 DIAGNOSIS — R0989 Other specified symptoms and signs involving the circulatory and respiratory systems: Secondary | ICD-10-CM

## 2011-01-24 DIAGNOSIS — Z7901 Long term (current) use of anticoagulants: Secondary | ICD-10-CM

## 2011-02-07 ENCOUNTER — Ambulatory Visit (INDEPENDENT_AMBULATORY_CARE_PROVIDER_SITE_OTHER): Payer: Self-pay | Admitting: Cardiology

## 2011-02-07 DIAGNOSIS — I4891 Unspecified atrial fibrillation: Secondary | ICD-10-CM

## 2011-02-07 DIAGNOSIS — R0989 Other specified symptoms and signs involving the circulatory and respiratory systems: Secondary | ICD-10-CM

## 2011-02-07 DIAGNOSIS — Z7901 Long term (current) use of anticoagulants: Secondary | ICD-10-CM

## 2011-02-09 ENCOUNTER — Ambulatory Visit: Payer: Self-pay | Admitting: Internal Medicine

## 2011-02-17 ENCOUNTER — Other Ambulatory Visit: Payer: Self-pay | Admitting: Pharmacist

## 2011-02-17 MED ORDER — WARFARIN SODIUM 2 MG PO TABS: ORAL_TABLET | ORAL | Status: DC

## 2011-02-21 ENCOUNTER — Ambulatory Visit (INDEPENDENT_AMBULATORY_CARE_PROVIDER_SITE_OTHER): Payer: Self-pay | Admitting: Internal Medicine

## 2011-02-21 DIAGNOSIS — R0989 Other specified symptoms and signs involving the circulatory and respiratory systems: Secondary | ICD-10-CM

## 2011-02-21 DIAGNOSIS — I4891 Unspecified atrial fibrillation: Secondary | ICD-10-CM

## 2011-02-21 DIAGNOSIS — Z7901 Long term (current) use of anticoagulants: Secondary | ICD-10-CM

## 2011-03-07 ENCOUNTER — Ambulatory Visit (INDEPENDENT_AMBULATORY_CARE_PROVIDER_SITE_OTHER): Payer: Self-pay | Admitting: Cardiology

## 2011-03-07 DIAGNOSIS — I4891 Unspecified atrial fibrillation: Secondary | ICD-10-CM

## 2011-03-07 DIAGNOSIS — R0989 Other specified symptoms and signs involving the circulatory and respiratory systems: Secondary | ICD-10-CM

## 2011-03-07 DIAGNOSIS — Z7901 Long term (current) use of anticoagulants: Secondary | ICD-10-CM

## 2011-03-15 ENCOUNTER — Telehealth: Payer: Self-pay | Admitting: Internal Medicine

## 2011-03-15 NOTE — Telephone Encounter (Signed)
New Msg: Pt calling wanting to schedule appt with Dr. Gala Romney. Please return pt call to discuss if Dr. Gala Romney wants to see pt at [facility], [facility], or transfer care to another MD.

## 2011-03-17 NOTE — Telephone Encounter (Signed)
Dawn please scheduled him an appt for mid to late Jan, thanks

## 2011-03-17 NOTE — Telephone Encounter (Signed)
Pt will be seen at the Heart Failure clinic per Dr Gala Romney.  Pt was notified and will be awaiting an appt.  Message was sent to the Heart Failure clinic to schedule Antonio Carrillo at their clinic.

## 2011-03-17 NOTE — Telephone Encounter (Signed)
appt is for Feb 12,  He is out of town most of January.  Thanks.

## 2011-03-22 LAB — POCT INR: INR: 2.6

## 2011-03-23 ENCOUNTER — Ambulatory Visit (INDEPENDENT_AMBULATORY_CARE_PROVIDER_SITE_OTHER): Payer: Self-pay | Admitting: Cardiology

## 2011-03-23 DIAGNOSIS — Z7901 Long term (current) use of anticoagulants: Secondary | ICD-10-CM

## 2011-03-23 DIAGNOSIS — R0989 Other specified symptoms and signs involving the circulatory and respiratory systems: Secondary | ICD-10-CM

## 2011-03-23 DIAGNOSIS — I4891 Unspecified atrial fibrillation: Secondary | ICD-10-CM

## 2011-04-03 ENCOUNTER — Ambulatory Visit: Payer: Self-pay | Admitting: Internal Medicine

## 2011-04-05 ENCOUNTER — Ambulatory Visit (INDEPENDENT_AMBULATORY_CARE_PROVIDER_SITE_OTHER): Payer: Self-pay | Admitting: Cardiology

## 2011-04-05 DIAGNOSIS — R0989 Other specified symptoms and signs involving the circulatory and respiratory systems: Secondary | ICD-10-CM

## 2011-04-05 DIAGNOSIS — I4891 Unspecified atrial fibrillation: Secondary | ICD-10-CM

## 2011-04-05 DIAGNOSIS — Z7901 Long term (current) use of anticoagulants: Secondary | ICD-10-CM

## 2011-04-05 LAB — POCT INR: INR: 2.4

## 2011-04-15 ENCOUNTER — Other Ambulatory Visit: Payer: Self-pay | Admitting: Internal Medicine

## 2011-04-17 ENCOUNTER — Ambulatory Visit (INDEPENDENT_AMBULATORY_CARE_PROVIDER_SITE_OTHER): Payer: Self-pay | Admitting: Cardiology

## 2011-04-17 ENCOUNTER — Other Ambulatory Visit: Payer: Self-pay

## 2011-04-17 DIAGNOSIS — R0989 Other specified symptoms and signs involving the circulatory and respiratory systems: Secondary | ICD-10-CM

## 2011-04-17 DIAGNOSIS — I4891 Unspecified atrial fibrillation: Secondary | ICD-10-CM

## 2011-04-17 DIAGNOSIS — Z7901 Long term (current) use of anticoagulants: Secondary | ICD-10-CM

## 2011-04-17 LAB — POCT INR: INR: 2.2

## 2011-04-17 MED ORDER — LOSARTAN POTASSIUM 100 MG PO TABS: 100.0000 mg | ORAL_TABLET | Freq: Every day | ORAL | Status: DC

## 2011-04-17 MED ORDER — AMIODARONE HCL 400 MG PO TABS: 200.0000 mg | ORAL_TABLET | Freq: Every day | ORAL | Status: DC

## 2011-05-01 ENCOUNTER — Ambulatory Visit (INDEPENDENT_AMBULATORY_CARE_PROVIDER_SITE_OTHER): Payer: Self-pay | Admitting: Internal Medicine

## 2011-05-01 DIAGNOSIS — Z7901 Long term (current) use of anticoagulants: Secondary | ICD-10-CM

## 2011-05-01 DIAGNOSIS — I4891 Unspecified atrial fibrillation: Secondary | ICD-10-CM

## 2011-05-01 DIAGNOSIS — R0989 Other specified symptoms and signs involving the circulatory and respiratory systems: Secondary | ICD-10-CM

## 2011-05-08 ENCOUNTER — Ambulatory Visit: Payer: Self-pay | Admitting: Internal Medicine

## 2011-05-15 ENCOUNTER — Ambulatory Visit (INDEPENDENT_AMBULATORY_CARE_PROVIDER_SITE_OTHER): Payer: Self-pay | Admitting: Cardiology

## 2011-05-15 DIAGNOSIS — R0989 Other specified symptoms and signs involving the circulatory and respiratory systems: Secondary | ICD-10-CM

## 2011-05-15 DIAGNOSIS — I4891 Unspecified atrial fibrillation: Secondary | ICD-10-CM

## 2011-05-15 DIAGNOSIS — Z7901 Long term (current) use of anticoagulants: Secondary | ICD-10-CM

## 2011-05-15 LAB — POCT INR: INR: 2.7

## 2011-05-16 ENCOUNTER — Ambulatory Visit (HOSPITAL_COMMUNITY): Payer: Medicare Other

## 2011-05-29 ENCOUNTER — Ambulatory Visit: Payer: Self-pay | Admitting: Cardiology

## 2011-05-29 DIAGNOSIS — I4891 Unspecified atrial fibrillation: Secondary | ICD-10-CM

## 2011-05-29 DIAGNOSIS — Z7901 Long term (current) use of anticoagulants: Secondary | ICD-10-CM

## 2011-05-29 LAB — POCT INR: INR: 2.8

## 2011-06-05 ENCOUNTER — Ambulatory Visit: Payer: Self-pay | Admitting: Pharmacist

## 2011-06-05 ENCOUNTER — Encounter: Payer: Self-pay | Admitting: Internal Medicine

## 2011-06-05 ENCOUNTER — Ambulatory Visit (INDEPENDENT_AMBULATORY_CARE_PROVIDER_SITE_OTHER): Payer: Medicare Other | Admitting: Internal Medicine

## 2011-06-05 VITALS — BP 148/89 | HR 70 | Resp 18 | Ht 76.0 in | Wt 275.8 lb

## 2011-06-05 DIAGNOSIS — I4891 Unspecified atrial fibrillation: Secondary | ICD-10-CM

## 2011-06-05 DIAGNOSIS — Z7901 Long term (current) use of anticoagulants: Secondary | ICD-10-CM

## 2011-06-05 LAB — CBC WITH DIFFERENTIAL/PLATELET
Basophils Relative: 0.6 % (ref 0.0–3.0)
Eosinophils Absolute: 0.4 10*3/uL (ref 0.0–0.7)
Eosinophils Relative: 5.4 % — ABNORMAL HIGH (ref 0.0–5.0)
Lymphocytes Relative: 21.2 % (ref 12.0–46.0)
MCHC: 33 g/dL (ref 30.0–36.0)
Monocytes Absolute: 0.9 10*3/uL (ref 0.1–1.0)
Neutrophils Relative %: 61.7 % (ref 43.0–77.0)
Platelets: 242 10*3/uL (ref 150.0–400.0)
RBC: 4.7 Mil/uL (ref 4.22–5.81)
WBC: 8.2 10*3/uL (ref 4.5–10.5)

## 2011-06-05 LAB — BASIC METABOLIC PANEL
BUN: 19 mg/dL (ref 6–23)
Calcium: 9.5 mg/dL (ref 8.4–10.5)
Creatinine, Ser: 0.8 mg/dL (ref 0.4–1.5)

## 2011-06-05 NOTE — Progress Notes (Signed)
The patient presents today for routine electrophysiology followup.  Since last being seen in our clinic, the patient reports doing very well.  He reports having rare palpitations but does not feel that he has recently had sustained afib. Today, he denies symptoms of chest pain, shortness of breath, orthopnea, PND, lower extremity edema, dizziness, presyncope, syncope, or neurologic sequela.  The patient feels that he is tolerating medications without difficulties and is otherwise without complaint today.   Past Medical History  Diagnosis Date  . Anticoagulants causing adverse effect in therapeutic use   . Atrial fib/flutter, transient     s/p ablations x3  . Hyperthyroidism     resolved per pt  . Testicular failure   . Aortic aneurysm     4.7cm  stable in size   Past Surgical History  Procedure Date  . Laminectomy     x2  . Appendectomy   . Total knee arthroplasty     bilateral  . Atrial ablation surgery 1. late 1990's  2. 02/01/06  3. 10/10    1. In [place]  2. At [facility]  3. Redo by Fawn [facility])  . Stress cardiolite 01/29/06  . Transthoracic echocardiogram 02/08/06    Current Outpatient Prescriptions  Medication Sig Dispense Refill  . amiodarone (PACERONE) 400 MG tablet Take 0.5 tablets (200 mg total) by mouth daily.  90 tablet  1  . amLODipine (NORVASC) 2.5 MG tablet TAKE 1 TABLET BY MOUTH EVERY DAY  90 tablet  2  . aspirin 81 MG tablet Take 81 mg by mouth daily.        . Cholecalciferol (VITAMIN D) 1000 UNITS capsule Take 1,000 Units by mouth daily.        Marland Kitchen escitalopram (LEXAPRO) 20 MG tablet       . fish oil-omega-3 fatty acids 1000 MG capsule Take by mouth daily.        Marland Kitchen losartan (COZAAR) 100 MG tablet Take 1 tablet (100 mg total) by mouth daily.  90 tablet  1  . lovastatin (MEVACOR) 20 MG tablet Take 1 tablet (20 mg total) by mouth at bedtime.  90 tablet  3  . PREVIDENT 5000 BOOSTER 1.1 % PSTE As directed      . Urea 45 % CREA       . vitamin B-12 (CYANOCOBALAMIN) 1000 MCG  tablet Take 1,000 mcg by mouth daily.        Marland Kitchen warfarin (COUMADIN) 2 MG tablet Take as directed by Anticoagulation clinic.  90 tablet  0    No Known Allergies  History   Social History  . Marital Status: Married    Spouse Name: N/A    Number of Children: N/A  . Years of Education: N/A   Occupational History  . 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 Smoking status: Never Smoker   . Smokeless tobacco: Not on file  . Alcohol Use: Not on file  . Drug Use: Not on file  . Sexually Active: Not on file   Other Topics Concern  . Not on file   Social History Narrative   Lives in [place].    Family History  Problem Relation Age of Onset  . Arrhythmia Mother     Physical Exam: Filed Vitals:   06/05/11 0946  BP: 148/89  Pulse: 70  Resp: 18  Height: 6\' 4"  (1.93 m)  Weight: 275 lb 12.8 oz (125.102 kg)    GEN- The patient  is well appearing, alert and oriented x 3 today.   Head- normocephalic, atraumatic Eyes-  Sclera clear, conjunctiva pink Ears- hearing intact Oropharynx- clear Neck- supple, no JVP Lymph- no cervical lymphadenopathy Lungs- Clear to ausculation bilaterally, normal work of breathing Heart- Regular rate and rhythm, no murmurs, rubs or gallops, PMI not laterally displaced GI- soft, NT, ND, + BS Extremities- no clubbing, cyanosis, or edema  ekg today reveals sinus rhythm 70 bpm, PR 184, QRS 142, RBBB, Qtc 514, diffuse TWI similar but more pronounced that prior ekgs  Assessment and Plan:

## 2011-06-05 NOTE — Patient Instructions (Addendum)
Your physician wants you to follow-up in: 6 months with Dr Jacquiline Doe will receive a reminder letter in the mail two months in advance. If you don't receive a letter, please call our office to schedule the follow-up appointment.   Your physician has recommended you make the following change in your medication:  1) Start Eliquis 5mg  twice daily 2) Stop Coumadin   Your physician recommends that you return for lab work today: BMP/CBC

## 2011-06-05 NOTE — Assessment & Plan Note (Addendum)
Doing well post ablation Return in 6 months Consider decreasing amiodarone back to 100mg  daily at that time if no further afib.  He would like to start a novel anticoagulant.  We discussed pros and cons of pradaxa, xarelto and eliquis.  He would like to start eliquis. We will start eliquis 5mg  BID once his INR is <2  Check CBC and CrCl today.

## 2011-06-09 ENCOUNTER — Encounter (HOSPITAL_COMMUNITY): Payer: Medicare Other

## 2011-06-11 ENCOUNTER — Emergency Department (INDEPENDENT_AMBULATORY_CARE_PROVIDER_SITE_OTHER)
Admission: EM | Admit: 2011-06-11 | Discharge: 2011-06-11 | Disposition: A | Discharge status: Home / Self Care | Payer: Medicare Other | Source: Home / Self Care | Attending: Emergency Medicine | Admitting: Emergency Medicine

## 2011-06-11 DIAGNOSIS — R05 Cough: Secondary | ICD-10-CM

## 2011-06-11 DIAGNOSIS — J069 Acute upper respiratory infection, unspecified: Secondary | ICD-10-CM

## 2011-06-11 MED ORDER — LEVOFLOXACIN 750 MG PO TABS: 750.0000 mg | ORAL_TABLET | Freq: Every day | ORAL | Status: AC

## 2011-06-11 MED ORDER — CEFTRIAXONE SODIUM 1 G IJ SOLR
1.0000 g | Freq: Once | INTRAMUSCULAR | Status: AC
  Administered 2011-06-11: 1 g via INTRAMUSCULAR

## 2011-06-11 NOTE — ED Provider Notes (Addendum)
History     CSN: [id number]  Arrival date & time 06/11/11  1107   First MD Initiated Contact with Patient 06/11/11 1114      Chief Complaint  Patient presents with  . Cough    (Consider location/radiation/quality/duration/timing/severity/associated sxs/prior treatment) HPI Antonio Carrillo is a 71 y.o. male who complains of onset of cold symptoms for a few weeks.  + sore throat + cough No pleuritic pain No wheezing + nasal congestion +post-nasal drainage + sinus pain/pressure + chest congestion No itchy/red eyes + earache No hemoptysis No SOB No chills/sweats NO fever No nausea No vomiting No abdominal pain No diarrhea No skin rashes + fatigue No myalgias + headache    Past Medical History  Diagnosis Date  . Anticoagulants causing adverse effect in therapeutic use   . Atrial fib/flutter, transient     s/p ablations x3  . Hyperthyroidism     resolved per pt  . Testicular failure   . Aortic aneurysm     4.7cm  stable in size    Past Surgical History  Procedure Date  . Laminectomy     x2  . Appendectomy   . Total knee arthroplasty     bilateral  . Atrial ablation surgery 1. late 1990's  2. 02/01/06  3. 10/10    1. In [place]  2. At [facility]  3. Redo by Fawn Kirk [facility])  . Stress cardiolite 01/29/06  . Transthoracic echocardiogram 02/08/06    Family History  Problem Relation Age of Onset  . Arrhythmia Mother     History  Substance Use Topics  . Smoking status: Never Smoker   . Smokeless tobacco: Not on file  . Alcohol Use: Not on file      Review of Systems  All other systems reviewed and are negative.    Allergies  Review of patient's allergies indicates no known allergies.  Home Medications   Current Outpatient Rx  Name Route Sig Dispense Refill  . AMIODARONE HCL 400 MG PO TABS Oral Take 0.5 tablets (200 mg total) by mouth daily. 90 tablet 1  . AMLODIPINE BESYLATE 2.5 MG PO TABS  TAKE 1 TABLET BY MOUTH EVERY DAY 90 tablet 2  . APIXABAN 5 MG PO TABS  Oral Take 5 mg by mouth 2 times daily at 12 noon and 4 pm. 20 tablet   . ASPIRIN 81 MG PO TABS Oral Take 81 mg by mouth daily.      Marland Kitchen VITAMIN D 1000 UNITS PO CAPS Oral Take 1,000 Units by mouth daily.      Marland Kitchen ESCITALOPRAM OXALATE 20 MG PO TABS      . OMEGA-3 FATTY ACIDS 1000 MG PO CAPS Oral Take by mouth daily.      Marland Kitchen LEVOFLOXACIN 750 MG PO TABS Oral Take 1 tablet (750 mg total) by mouth daily. 8 tablet 0  . LOSARTAN POTASSIUM 100 MG PO TABS Oral Take 1 tablet (100 mg total) by mouth daily. 90 tablet 1  . LOVASTATIN 20 MG PO TABS Oral Take 1 tablet (20 mg total) by mouth at bedtime. 90 tablet 3  . PREVIDENT 5000 BOOSTER 1.1 % DT PSTE  As directed    . UREA 45 % EX CREA      . VITAMIN B-12 1000 MCG PO TABS Oral Take 1,000 mcg by mouth daily.      . WARFARIN SODIUM 2 MG PO TABS  Take as directed by Anticoagulation clinic. 90 tablet 0    90-day supply  BP 162/83  Pulse 78  Temp(Src) 98.5 F (36.9 C) (Oral)  Resp 24  Ht 6\' 4"  (1.93 m)  Wt 273 lb 8 oz (124.059 kg)  BMI 33.29 kg/m2  SpO2 97%  Physical Exam  Nursing note and vitals reviewed. Constitutional: He is oriented to person, place, and time. He appears well-developed and well-nourished.  Non-toxic appearance. He does not have a sickly appearance. He appears ill. No distress.  HENT:  Head: Normocephalic and atraumatic.  Right Ear: External ear and ear canal normal. Tympanic membrane is erythematous.  Left Ear: External ear and ear canal normal. Tympanic membrane is erythematous.  Nose: Mucosal edema and rhinorrhea present. Right sinus exhibits maxillary sinus tenderness. Left sinus exhibits maxillary sinus tenderness.  Mouth/Throat: Posterior oropharyngeal erythema present. No oropharyngeal exudate or posterior oropharyngeal edema.  Eyes: No scleral icterus.  Neck: Neck supple.  Cardiovascular: Normal rate, regular rhythm and normal heart sounds.   Pulmonary/Chest: Effort normal. No respiratory distress. He has no decreased  breath sounds. He has wheezes (scattered mild). He has rhonchi (scattered mild).  Neurological: He is alert and oriented to person, place, and time.  Skin: Skin is warm and dry.  Psychiatric: He has a normal mood and affect. His speech is normal.    ED Course  Procedures (including critical care time)  Labs Reviewed - No data to display No results found.   1. Acute upper respiratory infections of unspecified site   2. Cough       MDM  1)  Take the prescribed antibiotic as instructed.  I am aware of the potential side effects and risks of taking fluoroquinolone with his medical issues, however I believe that the benefits outweigh the risks. At this time and hold off on an x-ray since he has not had a fever, however I think he may be on the cusp of pneumonia and we are treating him for this. If he is worsening over the next few days, then x-ray and blood work would be appropriate.  A shot of Rocephin is also given today in clinic. 2)  Use nasal saline solution (over the counter) at least 3 times a day. 3)  Can take tylenol every 6 hours or motrin every 8 hours for pain or fever. 4)  Follow up with your primary doctor if no improvement in 5-7 days, sooner if increasing pain, fever, or new symptoms.     Marlaine Hind, MD 06/11/11 1137  Marlaine Hind, MD 06/11/11 1146

## 2011-06-11 NOTE — ED Notes (Signed)
Cough for a couple of weeks; productive cough.

## 2011-07-24 ENCOUNTER — Encounter (HOSPITAL_COMMUNITY): Payer: Medicare Other

## 2011-08-22 ENCOUNTER — Encounter (HOSPITAL_COMMUNITY): Payer: Medicare Other

## 2011-09-14 ENCOUNTER — Ambulatory Visit (HOSPITAL_COMMUNITY)
Admission: RE | Admit: 2011-09-14 | Discharge: 2011-09-14 | Disposition: A | Discharge status: Home / Self Care | Payer: Medicare Other | Source: Ambulatory Visit | Attending: Internal Medicine | Admitting: Internal Medicine

## 2011-09-14 ENCOUNTER — Encounter (HOSPITAL_COMMUNITY): Payer: Self-pay

## 2011-09-14 VITALS — BP 145/80 | HR 73 | Ht 76.0 in | Wt 266.8 lb

## 2011-09-14 DIAGNOSIS — E785 Hyperlipidemia, unspecified: Secondary | ICD-10-CM

## 2011-09-14 DIAGNOSIS — I712 Thoracic aortic aneurysm, without rupture: Secondary | ICD-10-CM

## 2011-09-14 DIAGNOSIS — I4891 Unspecified atrial fibrillation: Secondary | ICD-10-CM | POA: Insufficient documentation

## 2011-09-14 MED ORDER — AMIODARONE HCL 400 MG PO TABS: 200.0000 mg | ORAL_TABLET | Freq: Every day | ORAL | Status: DC

## 2011-09-14 MED ORDER — LOVASTATIN 20 MG PO TABS: 20.0000 mg | ORAL_TABLET | Freq: Every day | ORAL | Status: DC

## 2011-09-14 MED ORDER — AMLODIPINE BESYLATE 2.5 MG PO TABS: 2.5000 mg | ORAL_TABLET | Freq: Every day | ORAL | Status: DC

## 2011-09-14 MED ORDER — LOSARTAN POTASSIUM 100 MG PO TABS: 100.0000 mg | ORAL_TABLET | Freq: Every day | ORAL | Status: DC

## 2011-09-14 MED ORDER — APIXABAN 5 MG PO TABS: 5.0000 mg | ORAL_TABLET | Freq: Two times a day (BID) | ORAL | Status: DC

## 2011-09-14 NOTE — Assessment & Plan Note (Signed)
Will arrange to get copy of f/u echo sent to our office for review.

## 2011-09-14 NOTE — Assessment & Plan Note (Signed)
Due for lipid check this week.

## 2011-09-14 NOTE — Assessment & Plan Note (Addendum)
Maintaining SR after ablation. Will continue amio and apixaban. Stop ASA. Continue f/u with Dr. Johney Frame. Will check amio surveillance labs and PFTs.

## 2011-09-14 NOTE — Patient Instructions (Addendum)
Labs at next week  Your physician has recommended that you have a pulmonary function test. Pulmonary Function Tests are a group of tests that measure how well air moves in and out of your lungs.  We will contact you in 6 months to schedule your next appointment.

## 2011-09-14 NOTE — Progress Notes (Signed)
Patient ID: Antonio Carrillo, male   DOB: Aug 23, 1940, 71 y.o.   MRN: [id number] HPI:    Ireoluwa is a very pleasant 71 year old with a history of recurrent atrial fibrillation, HTN, HL and ascending Ao aneurysm.  He was diagnosed with atrial fibrillation in 1977 after presenting with symptoms of fatigue and decreased exercise tolerance.  He reports increasing frequency and duration of atrial fibrillation since that time.  He failed medical therapy with flecainide, rhythmol and most recently multaq.  He reports that he was not tried on [place] or sotalol due to prolonged QT.  He underwent atrial flutter ablation in [place] late 1990s.  Atrial fibrillation ablation was performed at [facility] 02/01/2006 and then repeat ablation was done by Dr. Johney Frame in 01/2010. Was on amiodarone 100 once daily until July. Had breakthrough in 10/11 and 12/11 and had DC-CV. Amiodarone restarted qd.   Saw Dr. Johney Frame recently and switched from warfarin to apixaban. No bleeding. They also discussed decreasing amio to 100 day in the future.   Feels good. No further palpitations. Works out on elliptical 4x/week for 50 minutes at a time without problem. Minimal edema.   Had echo in March in [place]  Told everything was fine. SBP 120-125 at home. Does not have PCP.   Recent echo normal EF. Ao Root 44 asc ao 50mm (stable since 2007)   ROS: All systems negative except as listed in HPI, PMH and Problem List.  Past Medical History  Diagnosis Date  . Anticoagulants causing adverse effect in therapeutic use   . Atrial fib/flutter, transient     s/p ablations x3  . Hyperthyroidism     resolved per pt  . Testicular failure   . Aortic aneurysm     4.7cm  stable in size    Current Outpatient Prescriptions  Medication Sig Dispense Refill  . amiodarone (PACERONE) 400 MG tablet Take 0.5 tablets (200 mg total) by mouth daily.  90 tablet  1  . amLODipine (NORVASC) 2.5 MG tablet TAKE 1 TABLET BY MOUTH EVERY DAY  90 tablet  2  . apixaban  (ELIQUIS) 5 MG TABS tablet Take by mouth 2 (two) times daily.      Marland Kitchen aspirin 81 MG tablet Take 81 mg by mouth daily.        Marland Kitchen losartan (COZAAR) 100 MG tablet Take 1 tablet (100 mg total) by mouth daily.  90 tablet  1  . lovastatin (MEVACOR) 20 MG tablet Take 1 tablet (20 mg total) by mouth at bedtime.  90 tablet  3  . Multiple Vitamins-Minerals (CENTRUM SILVER PO) Take by mouth daily.      Marland Kitchen PREVIDENT 5000 BOOSTER 1.1 % PSTE As directed      . zolpidem (AMBIEN CR) 6.25 MG CR tablet Take 6.25 mg by mouth at bedtime as needed.      Marland Kitchen DISCONTD: Apixaban 5 MG TABS Take 5 mg by mouth 2 times daily at 12 noon and 4 pm.  20 tablet       PHYSICAL EXAM: Filed Vitals:   09/14/11 1023  BP: 145/80  Pulse: 73   General:  Well appearing. No resp difficulty HEENT: normal Neck: supple. JVP flat. Carotids 2+ bilaterally; no bruits. No lymphadenopathy or thryomegaly appreciated. Cor: PMI normal. Regular rate & rhythm. No rubs, gallops or murmurs. Lungs: clear Abdomen: obese. soft, nontender, nondistended. No hepatosplenomegaly. No bruits or masses. Good bowel sounds. Extremities: no cyanosis, clubbing, rash, edema. R DP 2+ L DP faintly palpable  Neuro: alert & orientedx3, cranial nerves grossly intact. Moves all 4 extremities w/o difficulty. Affect pleasant.    ECG: SR with PACs 76 RBBB. Diffuse ST depression (chronic)   ASSESSMENT & PLAN:

## 2011-09-26 ENCOUNTER — Encounter: Payer: Self-pay | Admitting: Internal Medicine

## 2011-10-16 ENCOUNTER — Encounter (HOSPITAL_COMMUNITY): Payer: Medicare Other

## 2011-10-22 ENCOUNTER — Other Ambulatory Visit: Payer: Self-pay | Admitting: Internal Medicine

## 2011-12-09 ENCOUNTER — Other Ambulatory Visit: Payer: Self-pay | Admitting: Internal Medicine

## 2011-12-09 DIAGNOSIS — I4891 Unspecified atrial fibrillation: Secondary | ICD-10-CM

## 2011-12-13 NOTE — Telephone Encounter (Signed)
Last ov 09/28/11. Refills approved

## 2012-04-29 ENCOUNTER — Ambulatory Visit: Payer: Medicare Other | Admitting: Internal Medicine

## 2012-05-23 ENCOUNTER — Ambulatory Visit: Payer: Medicare Other | Admitting: Internal Medicine

## 2012-06-13 ENCOUNTER — Ambulatory Visit: Payer: Medicare Other | Admitting: Internal Medicine

## 2012-07-03 ENCOUNTER — Ambulatory Visit: Payer: Medicare Other | Admitting: Internal Medicine

## 2012-07-15 ENCOUNTER — Encounter: Payer: Self-pay | Admitting: Internal Medicine

## 2012-07-25 ENCOUNTER — Ambulatory Visit: Payer: Medicare Other | Admitting: Internal Medicine

## 2012-08-19 ENCOUNTER — Ambulatory Visit: Payer: Medicare Other | Admitting: Internal Medicine

## 2012-09-11 ENCOUNTER — Ambulatory Visit: Payer: Medicare Other | Admitting: Internal Medicine

## 2012-09-16 ENCOUNTER — Ambulatory Visit (HOSPITAL_COMMUNITY)
Admission: AD | Admit: 2012-09-16 | Discharge: 2012-09-16 | Disposition: A | Discharge status: Home / Self Care | Payer: Medicare Other | Source: Ambulatory Visit | Attending: Internal Medicine | Admitting: Internal Medicine

## 2012-09-16 ENCOUNTER — Ambulatory Visit (HOSPITAL_BASED_OUTPATIENT_CLINIC_OR_DEPARTMENT_OTHER)
Admission: RE | Admit: 2012-09-16 | Discharge: 2012-09-16 | Disposition: A | Discharge status: Home / Self Care | Payer: Medicare Other | Source: Ambulatory Visit | Attending: Internal Medicine | Admitting: Internal Medicine

## 2012-09-16 ENCOUNTER — Encounter (HOSPITAL_COMMUNITY): Payer: Self-pay | Admitting: Anesthesiology

## 2012-09-16 ENCOUNTER — Ambulatory Visit (HOSPITAL_COMMUNITY)
Admission: RE | Admit: 2012-09-16 | Discharge: 2012-09-16 | Disposition: A | Discharge status: Home / Self Care | Payer: Medicare Other | Source: Ambulatory Visit | Attending: Internal Medicine | Admitting: Internal Medicine

## 2012-09-16 ENCOUNTER — Other Ambulatory Visit (HOSPITAL_COMMUNITY): Payer: Self-pay | Admitting: Internal Medicine

## 2012-09-16 ENCOUNTER — Other Ambulatory Visit (HOSPITAL_COMMUNITY): Payer: Self-pay | Admitting: Adult Health

## 2012-09-16 ENCOUNTER — Ambulatory Visit (HOSPITAL_COMMUNITY): Payer: Medicare Other | Admitting: Anesthesiology

## 2012-09-16 ENCOUNTER — Other Ambulatory Visit: Payer: Self-pay

## 2012-09-16 ENCOUNTER — Encounter: Payer: Self-pay | Admitting: Internal Medicine

## 2012-09-16 ENCOUNTER — Encounter (HOSPITAL_COMMUNITY)
Admission: AD | Disposition: A | Discharge status: Home / Self Care | Payer: Self-pay | Source: Ambulatory Visit | Attending: Internal Medicine

## 2012-09-16 DIAGNOSIS — I4891 Unspecified atrial fibrillation: Secondary | ICD-10-CM | POA: Insufficient documentation

## 2012-09-16 DIAGNOSIS — I712 Thoracic aortic aneurysm, without rupture: Secondary | ICD-10-CM

## 2012-09-16 DIAGNOSIS — I059 Rheumatic mitral valve disease, unspecified: Secondary | ICD-10-CM

## 2012-09-16 DIAGNOSIS — I350 Nonrheumatic aortic (valve) stenosis: Secondary | ICD-10-CM

## 2012-09-16 DIAGNOSIS — I714 Abdominal aortic aneurysm, without rupture, unspecified: Secondary | ICD-10-CM | POA: Insufficient documentation

## 2012-09-16 DIAGNOSIS — I509 Heart failure, unspecified: Secondary | ICD-10-CM

## 2012-09-16 DIAGNOSIS — E785 Hyperlipidemia, unspecified: Secondary | ICD-10-CM | POA: Insufficient documentation

## 2012-09-16 DIAGNOSIS — I10 Essential (primary) hypertension: Secondary | ICD-10-CM | POA: Insufficient documentation

## 2012-09-16 HISTORY — PX: CARDIOVERSION: SHX1299

## 2012-09-16 LAB — BASIC METABOLIC PANEL
CO2: 25 mEq/L (ref 19–32)
Calcium: 9.4 mg/dL (ref 8.4–10.5)
Chloride: 104 mEq/L (ref 96–112)
Creatinine, Ser: 0.71 mg/dL (ref 0.50–1.35)
Glucose, Bld: 131 mg/dL — ABNORMAL HIGH (ref 70–99)

## 2012-09-16 SURGERY — CARDIOVERSION
Anesthesia: General

## 2012-09-16 MED ORDER — PROPOFOL 10 MG/ML IV BOLUS
INTRAVENOUS | Status: DC | PRN
  Administered 2012-09-16: 90 mg via INTRAVENOUS

## 2012-09-16 MED ORDER — SODIUM CHLORIDE 0.9 % IV SOLN
INTRAVENOUS | Status: DC | PRN
  Administered 2012-09-16: 13:00:00 via INTRAVENOUS

## 2012-09-16 NOTE — Progress Notes (Signed)
Post 12 lead completed

## 2012-09-16 NOTE — H&P (View-Only) (Signed)
[facility] CLINIC NOTE    HPI:  Antonio Carrillo is a very pleasant 72 year old with a history of recurrent atrial fibrillation, HTN, HL and ascending Ao aneurysm. He underwent atrial flutter ablation in [place] late 1990s. Atrial fibrillation ablation was performed at [facility] 02/01/2006 and then repeat ablation was done by Dr. Johney Frame in 01/2010. Has been on amiodarone 200 once daily.. Had breakthrough in 10/11 and 12/11 and had DC-CV.   07/19/11 Recent echo normal EF. Ao Root 44 asc ao 50mm (stable since 2007)  He presented today for f/u and notes mild fatigue. No CHF symptoms or palpitations. Found to be in AF with RVR rates 130-140.He has been taking elliquis without mising.  Echo today EF normal Ao root 4.2cm.    Review of Systems:     Cardiac Review of Systems: {Y] = yes [ ]  = no  Chest Pain [    ]  Resting SOB [   ] Exertional SOB  [ y ]  Orthopnea [  ]   Pedal Edema [   ]    Palpitations [  ] Syncope  [  ]   Presyncope [   ]  General Review of Systems: [Y] = yes [  ]=no Constitional: recent weight change [  ]; anorexia [  ]; fatigue [ y ]; nausea [  ]; night sweats [  ]; fever [  ]; or chills [  ];                                      Eye : blurred vision [  ]; diplopia [   ]; vision changes [  ];  Amaurosis fugax[  ]; Resp: cough [  ];  wheezing[  ];  hemoptysis[  ]; shortness of breath[  ]; paroxysmal nocturnal dyspnea[  ]; dyspnea on exertion[y  ]; or orthopnea[  ];  GI:  gallstones[  ], vomiting[  ];  dysphagia[  ]; melena[  ];  hematochezia [  ]; heartburn[  ];    GU: kidney stones [  ]; hematuria[  ];   dysuria [  ];  nocturia[  ];  history of     obstruction [  ];                 Skin: rash, swelling[  ];, hair loss[  ];  peripheral edema[  ];  or itching[  ]; Musculosketetal: myalgias[  ];  joint swelling[  ];  joint erythema[  ];  joint pain[  ];  back pain[  ];  Heme/Lymph: bruising[  ];  bleeding[  ];  anemia[  ];  Neuro: TIA[  ];  headaches[  ];  stroke[  ];  vertigo[  ];  seizures[  ];    paresthesias[  ];  difficulty walking[  ];  Psych:depression[  ]; anxiety[  ];  Endocrine: diabetes[  ];  thyroid dysfunction[  ];  Other:  Past Medical History  Diagnosis Date  . Anticoagulants causing adverse effect in therapeutic use   . Atrial fib/flutter, transient     s/p ablations x3  . Hyperthyroidism     resolved per pt  . Testicular failure   . Aortic aneurysm     4.7cm  stable in size    No prescriptions prior to admission     No Known Allergies  History   Social History  . Marital Status: Married  Spouse Name: N/A    Number of Children: N/A  . Years of Education: N/A   Occupational History  . 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 Smoking status: Never Smoker   . Smokeless tobacco: Not on file  . Alcohol Use: Not on file  . Drug Use: Not on file  . Sexually Active: Not on file   Other Topics Concern  . Not on file   Social History Narrative   Lives in [place].    Family History  Problem Relation Age of Onset  . Arrhythmia Mother     PHYSICAL EXAM: There were no vitals filed for this visit. General:  Well appearing. No respiratory difficulty HEENT: normal Neck: supple. no JVD. Carotids 2+ bilat; no bruits. No lymphadenopathy or thryomegaly appreciated. Cor: PMI nondisplaced. Irregular tachy rate & rhythm. No rubs, gallops or murmurs. Lungs: clear Abdomen: soft, nontender, nondistended. No hepatosplenomegaly. No bruits or masses. Good bowel sounds. Extremities: no cyanosis, clubbing, rash, edema Neuro: alert & oriented x 3, cranial nerves grossly intact. moves all 4 extremities w/o difficulty. Affect pleasant.    No results found for this or any previous visit (from the past 24 hour(s)). No results found.

## 2012-09-16 NOTE — CV Procedure (Addendum)
     DIRECT CURRENT CARDIOVERSION  NAME:  ELIOTT AMPARAN   MRN: [id number] DOB:  03-16-1941   ADMIT DATE: 09/16/2012   INDICATIONS: Atrial fibrillation    PROCEDURE:   Informed consent was obtained prior to the procedure. The risks, benefits and alternatives for the procedure were discussed and the patient comprehended these risks. Once an appropriate time out was taken, the patient had the defibrillator pads placed in the anterior and posterior position. The patient then underwent sedation by the anesthesia service (Dr. Randa Evens) with 90 mg of IV propofol. Once an appropriate level of sedation was achieved, the patient received a single biphasic, synchronized 200J shock with prompt conversion to sinus rhythm. No apparent complications.   CONLCUSION:    Successful DC-CV of AF.   Increase amio to 200 bid. F/u with Dr. Johney Frame.  Daniel Bensimhon,MD 1:24 PM

## 2012-09-16 NOTE — Progress Notes (Signed)
12 lead EKG done , paged Dr Gala Romney and relayed result to him , no new order made

## 2012-09-16 NOTE — Interval H&P Note (Signed)
History and Physical Interval Note:  09/16/2012 1:23 PM  Antonio Carrillo  has presented today for surgery, with the diagnosis of a-fib  The various methods of treatment have been discussed with the patient and family. After consideration of risks, benefits and other options for treatment, the patient has consented to  Procedure(s) with comments: CARDIOVERSION (N/A) - angie will call back /heather [phone] as a surgical intervention .  The patient's history has been reviewed, patient examined, no change in status, stable for surgery.  I have reviewed the patient's chart and labs.  Questions were answered to the patient's satisfaction.     Daniel Bensimhon

## 2012-09-16 NOTE — Assessment & Plan Note (Signed)
Stable. Continue yearly follow-up.

## 2012-09-16 NOTE — Progress Notes (Signed)
*  PRELIMINARY RESULTS* Echocardiogram 2D Echocardiogram has been performed.  Antonio Carrillo 09/16/2012, 11:42 AM 

## 2012-09-16 NOTE — Preoperative (Signed)
Beta Blockers   Reason not to administer Beta Blockers:Not Applicable 

## 2012-09-16 NOTE — Transfer of Care (Signed)
Immediate Anesthesia Transfer of Care Note  Patient: Antonio Carrillo  Procedure(s) Performed: Procedure(s) with comments: CARDIOVERSION (N/A) - angie will call back /heather [phone]  Patient Location: PACU  Anesthesia Type:General  Level of Consciousness: awake, alert , oriented and patient cooperative  Airway & Oxygen Therapy: Patient Spontanous Breathing and Patient connected to nasal cannula oxygen  Post-op Assessment: Post -op Vital signs reviewed and stable and ENDO RN given report  Post vital signs: Reviewed and stable  Complications: No apparent anesthesia complications

## 2012-09-16 NOTE — Anesthesia Postprocedure Evaluation (Signed)
  Anesthesia Post-op Note  Patient: Antonio Carrillo  Procedure(s) Performed: Procedure(s) with comments: CARDIOVERSION (N/A) - angie will call back /heather [phone]  Patient Location: PACU  Anesthesia Type:General  Level of Consciousness: awake  Airway and Oxygen Therapy: Patient Spontanous Breathing  Post-op Pain: mild  Post-op Assessment: Post-op Vital signs reviewed  Post-op Vital Signs: Reviewed  Complications: No apparent anesthesia complications

## 2012-09-16 NOTE — Assessment & Plan Note (Signed)
Back in AF with RVR. On Eliquis without interruption. Will plan DC-CV today. Increase amio to 200 bid. Will need to f/u with Dr. Johney Frame.

## 2012-09-16 NOTE — Progress Notes (Signed)
HF CLINIC NOTE    HPI:  Antonio Carrillo is a very pleasant 71 year old with a history of recurrent atrial fibrillation, HTN, HL and ascending Ao aneurysm. He underwent atrial flutter ablation in [place] late 1990s. Atrial fibrillation ablation was performed at [facility] 02/01/2006 and then repeat ablation was done by Dr. Allred in 01/2010. Has been on amiodarone 200 once daily.. Had breakthrough in 10/11 and 12/11 and had DC-CV.   07/19/11 Recent echo normal EF. Ao Root 44 asc ao 50mm (stable since 2007)  He presented today for f/u and notes mild fatigue. No CHF symptoms or palpitations. Found to be in AF with RVR rates 130-140.He has been taking elliquis without mising.  Echo today EF normal Ao root 4.2cm.    Review of Systems:     Cardiac Review of Systems: {Y] = yes [ ] = no  Chest Pain [    ]  Resting SOB [   ] Exertional SOB  [ y ]  Orthopnea [  ]   Pedal Edema [   ]    Palpitations [  ] Syncope  [  ]   Presyncope [   ]  General Review of Systems: [Y] = yes [  ]=no Constitional: recent weight change [  ]; anorexia [  ]; fatigue [ y ]; nausea [  ]; night sweats [  ]; fever [  ]; or chills [  ];                                      Eye : blurred vision [  ]; diplopia [   ]; vision changes [  ];  Amaurosis fugax[  ]; Resp: cough [  ];  wheezing[  ];  hemoptysis[  ]; shortness of breath[  ]; paroxysmal nocturnal dyspnea[  ]; dyspnea on exertion[y  ]; or orthopnea[  ];  GI:  gallstones[  ], vomiting[  ];  dysphagia[  ]; melena[  ];  hematochezia [  ]; heartburn[  ];    GU: kidney stones [  ]; hematuria[  ];   dysuria [  ];  nocturia[  ];  history of     obstruction [  ];                 Skin: rash, swelling[  ];, hair loss[  ];  peripheral edema[  ];  or itching[  ]; Musculosketetal: myalgias[  ];  joint swelling[  ];  joint erythema[  ];  joint pain[  ];  back pain[  ];  Heme/Lymph: bruising[  ];  bleeding[  ];  anemia[  ];  Neuro: TIA[  ];  headaches[  ];  stroke[  ];  vertigo[  ];  seizures[  ];    paresthesias[  ];  difficulty walking[  ];  Psych:depression[  ]; anxiety[  ];  Endocrine: diabetes[  ];  thyroid dysfunction[  ];  Other:  Past Medical History  Diagnosis Date  . Anticoagulants causing adverse effect in therapeutic use   . Atrial fib/flutter, transient     s/p ablations x3  . Hyperthyroidism     resolved per pt  . Testicular failure   . Aortic aneurysm     4.7cm  stable in size    No prescriptions prior to admission     No Known Allergies  History   Social History  . Marital Status: Married      Spouse Name: N/A    Number of Children: N/A  . Years of Education: N/A   Occupational History  . retired     CFO for [employer]   Social History Main Topics  . Smoking status: Never Smoker   . Smokeless tobacco: Not on file  . Alcohol Use: Not on file  . Drug Use: Not on file  . Sexually Active: Not on file   Other Topics Concern  . Not on file   Social History Narrative   Lives in [place].    Family History  Problem Relation Age of Onset  . Arrhythmia Mother     PHYSICAL EXAM: There were no vitals filed for this visit. General:  Well appearing. No respiratory difficulty HEENT: normal Neck: supple. no JVD. Carotids 2+ bilat; no bruits. No lymphadenopathy or thryomegaly appreciated. Cor: PMI nondisplaced. Irregular tachy rate & rhythm. No rubs, gallops or murmurs. Lungs: clear Abdomen: soft, nontender, nondistended. No hepatosplenomegaly. No bruits or masses. Good bowel sounds. Extremities: no cyanosis, clubbing, rash, edema Neuro: alert & oriented x 3, cranial nerves grossly intact. moves all 4 extremities w/o difficulty. Affect pleasant.    No results found for this or any previous visit (from the past 24 hour(s)). No results found.   

## 2012-09-16 NOTE — Anesthesia Preprocedure Evaluation (Addendum)
Anesthesia Evaluation  Patient identified by MRN, date of birth, ID band Patient awake    Reviewed: Allergy & Precautions, H&P , NPO status , Patient's Chart, lab work & pertinent test results  History of Anesthesia Complications Negative for: history of anesthetic complications  Airway Mallampati: III TM Distance: >3 FB Neck ROM: Full    Dental  (+) Teeth Intact and Dental Advisory Given   Pulmonary shortness of breath (Related to afib.),  breath sounds clear to auscultation        Cardiovascular hypertension, Pt. on medications + dysrhythmias Atrial Fibrillation Rhythm:Irregular Rate:Tachycardia     Neuro/Psych    GI/Hepatic negative GI ROS, Neg liver ROS,   Endo/Other  Hyperthyroidism   Renal/GU negative Renal ROS     Musculoskeletal   Abdominal   Peds  Hematology negative hematology ROS (+)   Anesthesia Other Findings   Reproductive/Obstetrics negative OB ROS                           Anesthesia Physical Anesthesia Plan  ASA: III  Anesthesia Plan: General   Post-op Pain Management:    Induction: Intravenous  Airway Management Planned: Mask  Additional Equipment:   Intra-op Plan:   Post-operative Plan:   Informed Consent: I have reviewed the patients History and Physical, chart, labs and discussed the procedure including the risks, benefits and alternatives for the proposed anesthesia with the patient or authorized representative who has indicated his/her understanding and acceptance.   Dental advisory given  Plan Discussed with: CRNA, Anesthesiologist and Surgeon  Anesthesia Plan Comments:         Anesthesia Quick Evaluation

## 2012-09-17 ENCOUNTER — Other Ambulatory Visit (HOSPITAL_COMMUNITY): Payer: Self-pay | Admitting: Internal Medicine

## 2012-09-18 ENCOUNTER — Encounter (HOSPITAL_COMMUNITY): Payer: Self-pay | Admitting: Internal Medicine

## 2012-09-23 ENCOUNTER — Other Ambulatory Visit (HOSPITAL_COMMUNITY): Payer: Self-pay | Admitting: Internal Medicine

## 2012-09-26 ENCOUNTER — Telehealth: Payer: Self-pay | Admitting: Internal Medicine

## 2012-09-26 NOTE — Telephone Encounter (Signed)
PT daughter, an existing PT of yours, has called to request that you see her father. She does not mind the wait, but he does have MEDICARE as his primary insurance, and FED BCBC as his secondary. Would you like to accept him?

## 2012-10-01 ENCOUNTER — Other Ambulatory Visit (HOSPITAL_COMMUNITY): Payer: Self-pay | Admitting: Internal Medicine

## 2012-10-09 NOTE — Telephone Encounter (Signed)
OK but Would not be able to establish until sept or October.

## 2012-10-27 ENCOUNTER — Other Ambulatory Visit (HOSPITAL_COMMUNITY): Payer: Self-pay | Admitting: Internal Medicine

## 2012-11-06 ENCOUNTER — Ambulatory Visit: Payer: Medicare Other | Admitting: Internal Medicine

## 2012-11-18 ENCOUNTER — Other Ambulatory Visit (HOSPITAL_COMMUNITY): Payer: Self-pay | Admitting: Internal Medicine

## 2013-01-08 ENCOUNTER — Ambulatory Visit: Payer: Medicare Other | Admitting: Internal Medicine

## 2013-04-14 ENCOUNTER — Other Ambulatory Visit (HOSPITAL_COMMUNITY): Payer: Self-pay | Admitting: Internal Medicine

## 2013-09-18 ENCOUNTER — Other Ambulatory Visit (HOSPITAL_COMMUNITY): Payer: Self-pay | Admitting: Internal Medicine

## 2013-10-27 ENCOUNTER — Other Ambulatory Visit (HOSPITAL_COMMUNITY): Payer: Self-pay

## 2013-10-27 MED ORDER — LOSARTAN POTASSIUM 100 MG PO TABS: ORAL_TABLET | ORAL | Status: DC

## 2014-05-04 HISTORY — PX: THORACIC AORTIC ANEURYSM REPAIR: SHX799

## 2014-07-16 ENCOUNTER — Ambulatory Visit: Payer: Medicare Other | Admitting: Internal Medicine

## 2014-07-31 ENCOUNTER — Encounter: Payer: Self-pay | Admitting: Internal Medicine

## 2014-09-22 ENCOUNTER — Telehealth: Payer: Self-pay | Admitting: Internal Medicine

## 2014-09-22 NOTE — Telephone Encounter (Signed)
Patient states he had open heart surgery in February and has had nausea off and on since. He wants to see Dr. Olevia Perches about this. Scheduled OV on 10/02/14 at 2:30 PM.

## 2014-09-30 ENCOUNTER — Encounter: Payer: Self-pay | Admitting: Internal Medicine

## 2014-10-02 ENCOUNTER — Ambulatory Visit: Payer: Medicare Other | Admitting: Internal Medicine

## 2014-12-15 ENCOUNTER — Ambulatory Visit: Payer: Medicare Other | Admitting: Internal Medicine

## 2014-12-23 ENCOUNTER — Telehealth: Payer: Self-pay | Admitting: Internal Medicine

## 2014-12-23 ENCOUNTER — Encounter: Payer: Self-pay | Admitting: Internal Medicine

## 2014-12-23 ENCOUNTER — Ambulatory Visit (INDEPENDENT_AMBULATORY_CARE_PROVIDER_SITE_OTHER): Payer: Medicare Other | Admitting: Internal Medicine

## 2014-12-23 VITALS — BP 160/78 | HR 53 | Ht 75.0 in | Wt 243.8 lb

## 2014-12-23 DIAGNOSIS — I712 Thoracic aortic aneurysm, without rupture, unspecified: Secondary | ICD-10-CM

## 2014-12-23 DIAGNOSIS — I480 Paroxysmal atrial fibrillation: Secondary | ICD-10-CM

## 2014-12-23 DIAGNOSIS — I10 Essential (primary) hypertension: Secondary | ICD-10-CM | POA: Diagnosis not present

## 2014-12-23 MED ORDER — AMIODARONE HCL 200 MG PO TABS: 200.0000 mg | ORAL_TABLET | Freq: Every day | ORAL | Status: DC

## 2014-12-23 NOTE — Patient Instructions (Addendum)
Medication Instructions:  Your physician has recommended you make the following change in your medication:  1) Decrease Amiodarone to 200mg  daily   Labwork: None ordered  Testing/Procedures: None ordered   Follow-Up: Your physician recommends that you schedule a follow-up appointment in: 3 months with Roderic Palau, NP  Your physician recommends that you schedule a follow-up appointment with Dr [name] to establish next available in [facility]      Any Other Special Instructions Will Be Listed Below (If Applicable).

## 2014-12-23 NOTE — Telephone Encounter (Signed)
ROI faxed to [facility] records rec back today placed in chart prep bin.

## 2014-12-25 DIAGNOSIS — I719 Aortic aneurysm of unspecified site, without rupture: Secondary | ICD-10-CM | POA: Insufficient documentation

## 2014-12-25 DIAGNOSIS — C8590 Non-Hodgkin lymphoma, unspecified, unspecified site: Secondary | ICD-10-CM | POA: Insufficient documentation

## 2014-12-25 DIAGNOSIS — E291 Testicular hypofunction: Secondary | ICD-10-CM | POA: Insufficient documentation

## 2014-12-25 DIAGNOSIS — E0590 Thyrotoxicosis, unspecified without thyrotoxic crisis or storm: Secondary | ICD-10-CM | POA: Insufficient documentation

## 2014-12-25 DIAGNOSIS — I10 Essential (primary) hypertension: Secondary | ICD-10-CM | POA: Insufficient documentation

## 2014-12-25 DIAGNOSIS — E785 Hyperlipidemia, unspecified: Secondary | ICD-10-CM | POA: Insufficient documentation

## 2014-12-25 DIAGNOSIS — T45515A Adverse effect of anticoagulants, initial encounter: Secondary | ICD-10-CM | POA: Insufficient documentation

## 2014-12-25 NOTE — Progress Notes (Addendum)
The patient presents today to reestablish with EP.  He has not seen me since 2013.  He has more recently been seen by Dr Antonio Carrillo in [facility] for AF.  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  He has made slow recovery from surgery.  He has continued to have afib despite 3 prior ablations.  He remains on amiodarone  Currently, he denies CP, SOB, palpitations, dizziness, presyncope or syncope.  Past Medical History  Diagnosis Date  . Anticoagulants causing adverse effect in therapeutic use   . Persistent atrial fibrillation     s/p ablations x3  . Hyperthyroidism     resolved per pt  . Testicular failure   . Aortic aneurysm     s/p surgery at [facility]  . HTN (hypertension)   . Hyperlipidemia   . Lymphoma, low grade    Past Surgical History  Procedure Laterality Date  . Laminectomy      x2  . Appendectomy    . Total knee arthroplasty      bilateral  . Atrial ablation surgery  1. late 1990's  2. 02/01/06  3. 10/10    1. In [place]  2. At [facility]  3. Redo by Antonio Carrillo [facility])  . Stress cardiolite  01/29/06  . Transthoracic echocardiogram  02/08/06  . Cardioversion N/A 09/16/2012    Procedure: CARDIOVERSION;  Surgeon: Antonio Artist, MD;  Location: [facility] ENDOSCOPY;  Service: Cardiovascular;  Laterality: N/A;  angie will call back /heather [phone]  . Tonsillectomy    . Back surgery    . Thoracic aortic aneurysm repair  2/16    anomalous RCA also repaired at [facility]    Current Outpatient Prescriptions  Medication Sig Dispense Refill  . amiodarone (PACERONE) 200 MG tablet Take 1 tablet (200 mg total) by mouth daily. 180 tablet 3  . amLODipine (NORVASC) 5 MG tablet Take 1 tablet by mouth daily.    Marland Kitchen ELIQUIS 5 MG TABS tablet TAKE 1 TABLET BY MOUTH TWICE A DAY 180 tablet 3  . LORazepam (ATIVAN) 0.5 MG tablet Take 1 tablet by mouth 2 (two) times daily.    Marland Kitchen losartan (COZAAR) 100 MG tablet TAKE 1 TABLET  BY MOUTH ONCE DAILY 90 tablet 0  . lovastatin (MEVACOR) 20 MG tablet TAKE ONE TABLET BY MOUTH AT BEDTIME 90 tablet 2  . pantoprazole (PROTONIX) 20 MG tablet Take 20 mg by mouth daily.    . Probiotic Product (FLORAJEN3 PO) Take 2 capsules by mouth daily.    Marland Kitchen zolpidem (AMBIEN CR) 12.5 MG CR tablet Take 1 tablet by mouth at bedtime as needed. Sleep     No current facility-administered medications for this visit.    No Known Allergies  Social History   Social History  . Marital Status: Married    Spouse Name: N/A  . Number of Children: N/A  . Years of Education: N/A   Occupational History  . 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 Smoking status: Never Smoker   . Smokeless tobacco: Not on file  . Alcohol Use: Not on file  . Drug Use: Not on file  . Sexual Activity: Not on file   Other Topics Concern  . Not on file   Social History Narrative   Lives in [place].    Family History  Problem Relation Age of Onset  . Arrhythmia  Mother     Physical Exam: Filed Vitals:   12/23/14 1110  BP: 160/78  Pulse: 53  Height: 6\' 3"  (1.905 m)  Weight: 243 lb 12.8 oz (110.587 kg)    GEN- The patient is well appearing, alert and oriented x 3 today.   Head- normocephalic, atraumatic Eyes-  Sclera clear, conjunctiva pink Ears- hearing intact Oropharynx- clear Neck- supple, no JVP Lymph- no cervical lymphadenopathy Lungs- Clear to ausculation bilaterally, normal work of breathing Heart- Regular rate and rhythm, no murmurs, rubs or gallops, PMI not laterally displaced GI- soft, NT, ND, + BS Extremities- no clubbing, cyanosis, or edema  ekg today reveals sinus rhythm 70 bpm, PR 184, QRS 142, RBBB, Qtc 514, diffuse TWI similar but more pronounced that prior ekgs  Assessment and Plan:  Antonio Carrillo is well known to me though I have not seen him in quite some time.  He has a tendency to move around to different providers which makes health care delivery  difficult.  He has most recently been followed by Dr Antonio Carrillo at [facility] but now returns to reestablish with me.  His afib appears to be stable.  I will therefore reduce amiodarone to 200mg  daily today.  Chads2vasc score is at least 3.  He is anticoagulated with eliquis.  Given recent thoracic aneurysm repair and treatment of anomalous RCA, I think that he would benefit from follow-up with general cardiology.  I will therefore refer him to Dr Antonio Carrillo to be seen in [place].  We will also request records from [facility].  He has some [facility] in [facility].  BP is above goal today though he feels that BP has been stable and is reluctant to make changes today.   Follow-up with Antonio Palau NP in the AF clinic every 3 months.  I will see when needed. Refer to Dr Antonio Carrillo for general cardiology consultation  Antonio Grayer MD, [facility] 12/29/2014 10:41 PM

## 2014-12-29 ENCOUNTER — Encounter: Payer: Self-pay | Admitting: Internal Medicine

## 2014-12-29 ENCOUNTER — Telehealth: Payer: Self-pay | Admitting: Cardiology

## 2014-12-29 NOTE — Addendum Note (Signed)
Addended by: Thompson Grayer on: 12/29/2014 10:41 PM   Modules accepted: Level of Service

## 2014-12-29 NOTE — Telephone Encounter (Signed)
0927/2016 Received referral packet from [facility]- Dr. Georgina Quint for upcoming appointment with Dr. [name] on 01/19/2015.  Records giving to [facility].  cbr

## 2015-01-15 ENCOUNTER — Encounter: Payer: Self-pay | Admitting: *Deleted

## 2015-01-18 NOTE — Progress Notes (Signed)
HPI: FU atrial fibrillation, HTN, HL and ascending Ao aneurysm s/p repair. Previously followed by Dr Haroldine Laws and followed by Dr Rayann Heman for atrial fibrillation. Carotid Dopplers at [facility] clinic February 2016 showed 20-39% bilateral stenosis. Echocardiogram March 2016 showed normal LV function. There was biatrial enlargement. Moderate pericardial effusion. Status post ascending aorta repair.   He was diagnosed with atrial fibrillation in 1977. He failed medical therapy with flecainide, rhythmol and multaq. Not tried on TIkosyn or sotalol due to prolonged QT. He underwent atrial flutter ablation in [place] late 1990s. Patient has had 3 previous atrial fibrillation ablations. In February 2016 the patient had aortic root replacement, on a artery bypass graft 1, pulmonary vein isolation and left atrial appendage ligation at [facility] clinic.  Patient has had some difficulties with depression following his surgery. Otherwise denies dyspnea, chest pain, palpitations or syncope.  Current Outpatient Prescriptions  Medication Sig Dispense Refill  . amiodarone (PACERONE) 200 MG tablet Take 1 tablet (200 mg total) by mouth daily. 180 tablet 3  . amLODipine (NORVASC) 5 MG tablet Take 1 tablet by mouth daily.    Marland Kitchen ELIQUIS 5 MG TABS tablet TAKE 1 TABLET BY MOUTH TWICE A DAY 180 tablet 3  . escitalopram (LEXAPRO) 20 MG tablet Take 20 mg by mouth daily.    Marland Kitchen LORazepam (ATIVAN) 0.5 MG tablet Take 1 tablet by mouth 2 (two) times daily.    Marland Kitchen losartan (COZAAR) 100 MG tablet TAKE 1 TABLET BY MOUTH ONCE DAILY 90 tablet 0  . lovastatin (MEVACOR) 20 MG tablet TAKE ONE TABLET BY MOUTH AT BEDTIME 90 tablet 2  . pantoprazole (PROTONIX) 20 MG tablet Take 20 mg by mouth daily.    . Probiotic Product (FLORAJEN3 PO) Take 2 capsules by mouth daily.    Marland Kitchen zolpidem (AMBIEN CR) 12.5 MG CR tablet Take 1 tablet by mouth at bedtime as needed. Sleep     No current facility-administered medications for this visit.      Past Medical History  Diagnosis Date  . Anticoagulants causing adverse effect in therapeutic use   . Persistent atrial fibrillation (HCC)     s/p ablations x3  . Hyperthyroidism     resolved per pt  . Testicular failure   . Aortic aneurysm [facility])     s/p surgery at [facility]  . HTN (hypertension)   . Hyperlipidemia   . Lymphoma, low grade [facility])     Past Surgical History  Procedure Laterality Date  . Laminectomy      x2  . Appendectomy    . Total knee arthroplasty      bilateral  . Atrial ablation surgery  1. late 1990's  2. 02/01/06  3. 10/10    1. In [place]  2. At [facility]  3. Redo by Greggory Br[facility])  . Stress cardiolite  01/29/06  . Transthoracic echocardiogram  02/08/06  . Cardioversion N/A 09/16/2012    Procedure: CARDIOVERSION;  Surgeon: Jolaine Artist, MD;  Location: [facility] ENDOSCOPY;  Service: Cardiovascular;  Laterality: N/A;  angie will call back /heather [phone]  . Tonsillectomy    . Back surgery    . Thoracic aortic aneurysm repair  2/16    anomalous RCA also repaired at [facility]    Social History   Social History  . Marital Status: Married    Spouse Name: N/A  . Number of Children: N/A  . Years of Education: N/A   Occupational History  . retired     [employer]  for [employer] History Main Topics  . Smoking status: Never Smoker   . Smokeless tobacco: Not on file  . Alcohol Use: Not on file  . Drug Use: Not on file  . Sexual Activity: Not on file   Other Topics Concern  . Not on file   Social History Narrative   Lives in [place].    ROS: no fevers or chills, productive cough, hemoptysis, dysphasia, odynophagia, melena, hematochezia, dysuria, hematuria, rash, seizure activity, orthopnea, PND, pedal edema, claudication. Remaining systems are negative.  Physical Exam: Well-developed well-nourished in no acute distress.  Skin is warm and dry.  HEENT is normal.  Neck is supple.  Chest is clear to auscultation with normal expansion.   Cardiovascular exam is regular rate and rhythm. Previous sternotomy. Abdominal exam nontender or distended. No masses palpated. Extremities show no edema. neuro grossly intact  ECG 12/23/2014-sinus bradycardia, right bundle branch block.

## 2015-01-19 ENCOUNTER — Encounter: Payer: Self-pay | Admitting: Cardiology

## 2015-01-19 ENCOUNTER — Ambulatory Visit (INDEPENDENT_AMBULATORY_CARE_PROVIDER_SITE_OTHER): Payer: Medicare Other | Admitting: Cardiology

## 2015-01-19 VITALS — BP 162/62 | HR 60 | Ht 75.0 in | Wt 238.8 lb

## 2015-01-19 DIAGNOSIS — I4892 Unspecified atrial flutter: Secondary | ICD-10-CM

## 2015-01-19 DIAGNOSIS — I712 Thoracic aortic aneurysm, without rupture, unspecified: Secondary | ICD-10-CM

## 2015-01-19 DIAGNOSIS — I679 Cerebrovascular disease, unspecified: Secondary | ICD-10-CM

## 2015-01-19 DIAGNOSIS — I3139 Other pericardial effusion (noninflammatory): Secondary | ICD-10-CM

## 2015-01-19 DIAGNOSIS — I319 Disease of pericardium, unspecified: Secondary | ICD-10-CM | POA: Diagnosis not present

## 2015-01-19 DIAGNOSIS — I313 Pericardial effusion (noninflammatory): Secondary | ICD-10-CM

## 2015-01-19 NOTE — Assessment & Plan Note (Signed)
Blood pressure mildly elevated.However he follows this at home and it is typically control. Continue present medications and follow.

## 2015-01-19 NOTE — Assessment & Plan Note (Signed)
Plan repeat echocardiogram to reassess pericardial effusion.

## 2015-01-19 NOTE — Assessment & Plan Note (Signed)
Patient remains in sinus rhythm on examination. Continue amiodarone. Check TSH, liver functions and chest x-ray. Continue apixaban. Check hemoglobin and renal function.

## 2015-01-19 NOTE — Assessment & Plan Note (Signed)
Continue statin. 

## 2015-01-19 NOTE — Patient Instructions (Signed)
Medication Instructions:   NO CHANGE  Labwork:  Your physician recommends that you return for lab work AT [place] OFFICE  Testing/Procedures:  A chest x-ray takes a picture of the organs and structures inside the chest, including the heart, lungs, and blood vessels. This test can show several things, including, whether the heart is enlarges; whether fluid is building up in the lungs; and whether pacemaker / defibrillator leads are still in place. AT [place]  Your physician has requested that you have an echocardiogram. Echocardiography is a painless test that uses sound waves to create images of your heart. It provides your doctor with information about the size and shape of your heart and how well your heart's chambers and valves are working. This procedure takes approximately one hour. There are no restrictions for this procedure.PLEASE SCHEDULE AT [place]   Your physician has requested that you have a carotid duplex. This test is an ultrasound of the carotid arteries in your neck. It looks at blood flow through these arteries that supply the brain with blood. Allow one hour for this exam. There are no restrictions or special instructions.DUE IN FEB-PLEASE SCHEDULE [facility]   Follow-Up:  Your physician wants you to follow-up in: [place] will receive a reminder letter in the mail two months in advance. If you don't receive a letter, please call our office to schedule the follow-up appointment.

## 2015-01-19 NOTE — Assessment & Plan Note (Addendum)
Status post repair. Note 25 minutes were spent reviewing previous records and outside records prior to patient arrival.

## 2015-01-19 NOTE — Assessment & Plan Note (Signed)
Schedule follow-up carotid Dopplers February 2017.

## 2015-01-28 ENCOUNTER — Ambulatory Visit (HOSPITAL_COMMUNITY): Payer: Medicare Other | Attending: Cardiovascular Disease

## 2015-01-28 ENCOUNTER — Other Ambulatory Visit: Payer: Self-pay

## 2015-01-28 DIAGNOSIS — I517 Cardiomegaly: Secondary | ICD-10-CM | POA: Diagnosis not present

## 2015-01-28 DIAGNOSIS — I371 Nonrheumatic pulmonary valve insufficiency: Secondary | ICD-10-CM | POA: Insufficient documentation

## 2015-01-28 DIAGNOSIS — E785 Hyperlipidemia, unspecified: Secondary | ICD-10-CM | POA: Insufficient documentation

## 2015-01-28 DIAGNOSIS — I77810 Thoracic aortic ectasia: Secondary | ICD-10-CM | POA: Diagnosis not present

## 2015-01-28 DIAGNOSIS — I10 Essential (primary) hypertension: Secondary | ICD-10-CM | POA: Diagnosis not present

## 2015-01-28 DIAGNOSIS — I319 Disease of pericardium, unspecified: Secondary | ICD-10-CM

## 2015-01-28 DIAGNOSIS — I340 Nonrheumatic mitral (valve) insufficiency: Secondary | ICD-10-CM | POA: Insufficient documentation

## 2015-01-28 DIAGNOSIS — I3139 Other pericardial effusion (noninflammatory): Secondary | ICD-10-CM

## 2015-01-28 DIAGNOSIS — Z951 Presence of aortocoronary bypass graft: Secondary | ICD-10-CM | POA: Insufficient documentation

## 2015-01-28 DIAGNOSIS — I313 Pericardial effusion (noninflammatory): Secondary | ICD-10-CM | POA: Diagnosis not present

## 2015-01-28 DIAGNOSIS — I351 Nonrheumatic aortic (valve) insufficiency: Secondary | ICD-10-CM | POA: Diagnosis not present

## 2015-03-13 ENCOUNTER — Other Ambulatory Visit: Payer: Self-pay | Admitting: Cardiology

## 2015-03-15 ENCOUNTER — Inpatient Hospital Stay (HOSPITAL_COMMUNITY): Admission: RE | Admit: 2015-03-15 | Payer: Medicare Other | Source: Ambulatory Visit | Admitting: Nurse Practitioner

## 2015-03-15 NOTE — Telephone Encounter (Signed)
Rx request sent to pharmacy.  

## 2015-03-31 ENCOUNTER — Encounter (HOSPITAL_COMMUNITY): Payer: Self-pay | Admitting: Nurse Practitioner

## 2015-03-31 ENCOUNTER — Ambulatory Visit (HOSPITAL_COMMUNITY)
Admission: RE | Admit: 2015-03-31 | Discharge: 2015-03-31 | Disposition: A | Discharge status: Home / Self Care | Payer: Medicare Other | Source: Ambulatory Visit | Attending: Nurse Practitioner | Admitting: Nurse Practitioner

## 2015-03-31 VITALS — BP 156/78 | HR 54 | Ht 75.0 in | Wt 248.2 lb

## 2015-03-31 DIAGNOSIS — I4891 Unspecified atrial fibrillation: Secondary | ICD-10-CM | POA: Insufficient documentation

## 2015-03-31 DIAGNOSIS — I480 Paroxysmal atrial fibrillation: Secondary | ICD-10-CM

## 2015-03-31 NOTE — Progress Notes (Signed)
Patient ID: Antonio Carrillo, male   DOB: [birth year], 74 y.o.   MRN: [id number]     Primary Care Physician: Pcp Not In System Referring Physician: Dr. Rayann Heman Cardiologist: Dr. Joesphine Bare is a 74 y.o. male with a h/o atrial fibrillation, HTN, HL and ascending Aortic aneurysm s/p repair. Previously followed by Dr Haroldine Laws and followed by Dr Rayann Heman for atrial fibrillation. Carotid Dopplers at [facility] clinic February 2016 showed 20-39% bilateral stenosis. Echocardiogram March 2016 showed normal LV function. There was biatrial enlargement. Moderate pericardial effusion. Status post ascending aorta repair.   He was diagnosed with atrial fibrillation in 1977. He failed medical therapy with flecainide, rhythmol and multaq. Not tried on TIkosyn or sotalol due to prolonged QT. He underwent atrial flutter ablation in [place] late 1990s. Patient has had 3 previous atrial fibrillation ablations. In February 2016 the patient had aortic root replacement, one artery bypass graft 1, pulmonary vein isolation and left atrial appendage ligation at [facility] clinic.  He is in the afib clinic today to get established. He reports no episodes of afib since maze procedure 2/16. Overall, he is feeling well. No issues with eliquis for which he is compliant. He recently established with Dr. [name] and monitoring labs for amiodarone were ordered but never done,  Echo done and chest xray ordered but not  performed.  Today, he denies symptoms of palpitations, chest pain, shortness of breath, orthopnea, PND, lower extremity edema, dizziness, presyncope, syncope, or neurologic sequela. The patient is tolerating medications without difficulties and is otherwise without complaint today.   Past Medical History  Diagnosis Date  . Anticoagulants causing adverse effect in therapeutic use   . Persistent atrial fibrillation (HCC)     s/p ablations x3  . Hyperthyroidism     resolved per pt  . Testicular failure   .  Aortic aneurysm [facility])     s/p surgery at [facility]  . HTN (hypertension)   . Hyperlipidemia   . 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 Laminectomy      x2  . Appendectomy    . Total knee arthroplasty      bilateral  . Atrial ablation surgery  1. late 1990's  2. 02/01/06  3. 10/10    1. In [place]  2. At [facility]  3. Redo by Greggory Brandy [facility])  . Stress cardiolite  01/29/06  . Transthoracic echocardiogram  02/08/06  . Cardioversion N/A 09/16/2012    Procedure: CARDIOVERSION;  Surgeon: Jolaine Artist, MD;  Location: [facility] ENDOSCOPY;  Service: Cardiovascular;  Laterality: N/A;  angie will call back /heather [phone]  . Tonsillectomy    . Back surgery    . Thoracic aortic aneurysm repair  2/16    anomalous RCA also repaired at [facility]    Current Outpatient Prescriptions  Medication Sig Dispense Refill  . amiodarone (PACERONE) 200 MG tablet Take 1 tablet (200 mg total) by mouth daily. 180 tablet 3  . amLODipine (NORVASC) 5 MG tablet Take 1 tablet by mouth daily.    Marland Kitchen ELIQUIS 5 MG TABS tablet TAKE 1 TABLET BY MOUTH TWICE A DAY 180 tablet 3  . LORazepam (ATIVAN) 0.5 MG tablet Take 1 tablet by mouth 2 (two) times daily.    Marland Kitchen losartan (COZAAR) 100 MG tablet TAKE 1 TABLET BY MOUTH ONCE DAILY 90 tablet 0  . lovastatin (MEVACOR) 20 MG tablet TAKE ONE TABLET BY MOUTH AT BEDTIME 90 tablet 2  .  pantoprazole (PROTONIX) 20 MG tablet TAKE ONE TABLET BY MOUTH EVERY DAY 30 tablet 3  . Probiotic Product (FLORAJEN3 PO) Take 2 capsules by mouth daily.    Marland Kitchen zolpidem (AMBIEN CR) 12.5 MG CR tablet Take 1 tablet by mouth at bedtime as needed. Sleep     No current facility-administered medications for this encounter.    No Known Allergies  Social History   Social History  . Marital Status: Married    Spouse Name: N/A  . Number of Children: N/A  . Years of Education: N/A   Occupational History  . 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  Smoking status: Never Smoker   . Smokeless tobacco: Not on file  . Alcohol Use: Not on file  . Drug Use: Not on file  . Sexual Activity: Not on file   Other Topics Concern  . Not on file   Social History Narrative   Lives in [place].    Family History  Problem Relation Age of Onset  . Arrhythmia Mother     ROS- All systems are reviewed and negative except as per the HPI above  Physical Exam: Filed Vitals:   03/31/15 0925  BP: 156/78  Pulse: 54  Height: 6\' 3"  (1.905 m)  Weight: 248 lb 3.2 oz (112.583 kg)    GEN- The patient is well appearing, alert and oriented x 3 today.   Head- normocephalic, atraumatic Eyes-  Sclera clear, conjunctiva pink Ears- hearing intact Oropharynx- clear Neck- supple, no JVP Lymph- no cervical lymphadenopathy Lungs- Clear to ausculation bilaterally, normal work of breathing Heart- Regular rate and rhythm, no murmurs, rubs or gallops, PMI not laterally displaced GI- soft, NT, ND, + BS Extremities- no clubbing, cyanosis, or edema MS- no significant deformity or atrophy Skin- no rash or lesion Psych- euthymic mood, full affect Neuro- strength and sensation are intact  EKG-  Sinus bradycardia at 54 bpm with wide QRS rhythm, RBBB, unchanged compared to previous EKG's, pr int 200 ms, qrs int 170 ms, qtc 508 ms  Screening labs for amiodarone ordered in October but were never done  Echo-Left ventricle: The cavity size was mildly dilated. Systolic function was normal. The estimated ejection fraction was in the range of 60% to 65%. Wall motion was normal; there were no regional wall motion abnormalities. Doppler parameters are consistent with a reversible restrictive pattern, indicative of decreased left ventricular diastolic compliance and/or increased left atrial pressure (grade 3 diastolic dysfunction). Doppler parameters are consistent with elevated ventricular end-diastolic filling pressure. - Aortic valve: There was  mild regurgitation. Regurgitation pressure half-time: 680 ms. - Aorta: Ascending aortic diameter: 41 mm (S). - Aortic root: The aortic root was dilated. - Ascending aorta: The ascending aorta was moderately dilated. - Mitral valve: Transvalvular velocity was within the normal range. There was no evidence for stenosis. There was trivial regurgitation. - Left atrium: The atrium was severely dilated. - Right ventricle: The cavity size was normal. Wall thickness was normal. Systolic function was normal. - Right atrium: The atrium was severely dilated. - Atrial septum: No defect or patent foramen ovale was identified. - Pulmonic valve: There was mild regurgitation. - Inferior vena cava: The vessel was normal in size. The respirophasic diameter changes were in the normal range (>= 50%), consistent with normal central venous pressure.   Assessment and Plan: 1. Afib Staying in SR following maze 2016 Continue amiodarone 200 mg a day TSH, cmet, cbc  Continue eliquis  F/u in afib clinic in 3 months  Geroge Baseman. Travis Mastel, [facility] [address] [place], [place] [address] [phone]

## 2015-04-06 ENCOUNTER — Ambulatory Visit (HOSPITAL_COMMUNITY)
Admission: RE | Admit: 2015-04-06 | Discharge: 2015-04-06 | Disposition: A | Discharge status: Home / Self Care | Payer: Medicare Other | Source: Ambulatory Visit | Attending: Nurse Practitioner | Admitting: Nurse Practitioner

## 2015-04-06 DIAGNOSIS — Z79899 Other long term (current) drug therapy: Secondary | ICD-10-CM | POA: Diagnosis not present

## 2015-04-06 DIAGNOSIS — Z7902 Long term (current) use of antithrombotics/antiplatelets: Secondary | ICD-10-CM | POA: Insufficient documentation

## 2015-04-06 DIAGNOSIS — I480 Paroxysmal atrial fibrillation: Secondary | ICD-10-CM | POA: Diagnosis not present

## 2015-04-06 DIAGNOSIS — I4891 Unspecified atrial fibrillation: Secondary | ICD-10-CM | POA: Diagnosis not present

## 2015-04-06 LAB — CBC
HEMATOCRIT: 41.3 % (ref 39.0–52.0)
HEMOGLOBIN: 13.2 g/dL (ref 13.0–17.0)
MCH: 28.9 pg (ref 26.0–34.0)
MCHC: 32 g/dL (ref 30.0–36.0)
MCV: 90.6 fL (ref 78.0–100.0)
Platelets: 139 10*3/uL — ABNORMAL LOW (ref 150–400)
RBC: 4.56 MIL/uL (ref 4.22–5.81)
RDW: 14.2 % (ref 11.5–15.5)
WBC: 7.3 10*3/uL (ref 4.0–10.5)

## 2015-04-06 LAB — COMPREHENSIVE METABOLIC PANEL
ALT: 33 U/L (ref 17–63)
ANION GAP: 6 (ref 5–15)
AST: 31 U/L (ref 15–41)
Albumin: 3.9 g/dL (ref 3.5–5.0)
Alkaline Phosphatase: 89 U/L (ref 38–126)
BUN: 23 mg/dL — ABNORMAL HIGH (ref 6–20)
CHLORIDE: 107 mmol/L (ref 101–111)
CO2: 28 mmol/L (ref 22–32)
Calcium: 9.1 mg/dL (ref 8.9–10.3)
Creatinine, Ser: 0.9 mg/dL (ref 0.61–1.24)
GFR calc Af Amer: >60 mL/min (ref >60)
GFR calc non Af Amer: >60 mL/min (ref >60)
GLUCOSE: 141 mg/dL — AB (ref 65–99)
POTASSIUM: 4.6 mmol/L (ref 3.5–5.1)
SODIUM: 141 mmol/L (ref 135–145)
Total Bilirubin: 0.4 mg/dL (ref 0.3–1.2)
Total Protein: 5.8 g/dL — ABNORMAL LOW (ref 6.5–8.1)

## 2015-04-06 LAB — TSH: TSH: 3.007 u[IU]/mL (ref 0.350–4.500)

## 2015-04-09 ENCOUNTER — Other Ambulatory Visit (HOSPITAL_COMMUNITY): Payer: Medicare Other | Admitting: Nurse Practitioner

## 2015-04-13 ENCOUNTER — Ambulatory Visit (HOSPITAL_COMMUNITY): Payer: Medicare Other | Admitting: Nurse Practitioner

## 2015-05-27 ENCOUNTER — Telehealth: Payer: Self-pay | Admitting: Cardiology

## 2015-05-27 MED ORDER — AMOXICILLIN-POT CLAVULANATE 250-125 MG PO TABS: 1.0000 | ORAL_TABLET | Freq: Two times a day (BID) | ORAL | Status: DC

## 2015-05-27 NOTE — Telephone Encounter (Signed)
Pt wants to know what can he take for Bronchitis,that will not interfere with his other medicine. He was prescribed by another doctor Azithromyzin,but that will  Interfere with his Amiodarone.Please call asap,very uncomfortable.If not at first phone number,please call-[phone].

## 2015-05-27 NOTE — Telephone Encounter (Signed)
Spoke with pt pharmacy, new script given for Augmentin. See previous phone note.

## 2015-05-27 NOTE — Telephone Encounter (Signed)
New script sent to the pharmacy  Unable to reach pt or leave a message

## 2015-05-27 NOTE — Telephone Encounter (Signed)
They wanted Dr [name] to know that there is a drug interaction between Amioadarone and Zithromax.

## 2015-05-27 NOTE — Telephone Encounter (Signed)
Pt got Z Pack prescribed for bronchitis by Dr. Marlowe Sh[place] office. He is calling because [place] advised that the azithromycin would interfere w amiodarone (QT prolongation). Wants advice on safe alternative or if Z pack OK to take per our instruction.

## 2015-05-27 NOTE — Telephone Encounter (Signed)
Augmentin 250 BID for 7 days Kirk [place]s

## 2015-07-01 ENCOUNTER — Telehealth (HOSPITAL_COMMUNITY): Payer: Self-pay | Admitting: *Deleted

## 2015-07-01 NOTE — Telephone Encounter (Signed)
Pt called in stating since yesterday he has noticed his HR in the 90s but regular and SBP 170s. Usually his HR is in the 50s.  Doesn't feel symptomatic from these readings but felt he should call to report.  Discussed with Roderic Palau, NP recommended taking metoprolol tartrate 25mg  BID (has taken in past) and report readings tomorrow. If still elevated will bring in for office visit. Patient verbalized understanding.

## 2015-07-02 ENCOUNTER — Encounter (HOSPITAL_COMMUNITY): Payer: Self-pay | Admitting: Nurse Practitioner

## 2015-07-02 ENCOUNTER — Ambulatory Visit (HOSPITAL_COMMUNITY)
Admission: RE | Admit: 2015-07-02 | Discharge: 2015-07-02 | Disposition: A | Discharge status: Home / Self Care | Payer: Medicare Other | Source: Ambulatory Visit | Attending: Nurse Practitioner | Admitting: Nurse Practitioner

## 2015-07-02 VITALS — BP 140/96 | HR 86 | Ht 75.0 in | Wt 239.6 lb

## 2015-07-02 DIAGNOSIS — I10 Essential (primary) hypertension: Secondary | ICD-10-CM | POA: Insufficient documentation

## 2015-07-02 DIAGNOSIS — E785 Hyperlipidemia, unspecified: Secondary | ICD-10-CM | POA: Diagnosis not present

## 2015-07-02 DIAGNOSIS — I712 Thoracic aortic aneurysm, without rupture: Secondary | ICD-10-CM | POA: Diagnosis not present

## 2015-07-02 DIAGNOSIS — E0590 Thyrotoxicosis, unspecified without thyrotoxic crisis or storm: Secondary | ICD-10-CM | POA: Insufficient documentation

## 2015-07-02 DIAGNOSIS — I491 Atrial premature depolarization: Secondary | ICD-10-CM

## 2015-07-02 DIAGNOSIS — Z96653 Presence of artificial knee joint, bilateral: Secondary | ICD-10-CM | POA: Diagnosis not present

## 2015-07-02 DIAGNOSIS — I480 Paroxysmal atrial fibrillation: Secondary | ICD-10-CM | POA: Insufficient documentation

## 2015-07-02 DIAGNOSIS — I498 Other specified cardiac arrhythmias: Secondary | ICD-10-CM | POA: Diagnosis not present

## 2015-07-02 DIAGNOSIS — Z7901 Long term (current) use of anticoagulants: Secondary | ICD-10-CM | POA: Diagnosis not present

## 2015-07-02 DIAGNOSIS — C8590 Non-Hodgkin lymphoma, unspecified, unspecified site: Secondary | ICD-10-CM | POA: Insufficient documentation

## 2015-07-02 MED ORDER — AMIODARONE HCL 200 MG PO TABS: 200.0000 mg | ORAL_TABLET | Freq: Two times a day (BID) | ORAL | Status: DC

## 2015-07-02 MED ORDER — METOPROLOL TARTRATE 25 MG PO TABS: ORAL_TABLET | ORAL | Status: DC

## 2015-07-02 NOTE — Patient Instructions (Signed)
Your physician has recommended you make the following change in your medication:  1)Increase Amiodarone 200mg  twice a day 2)Stop daily metoprolol -- may take extra 1/2 tablet of metoprolol if HR over 100

## 2015-07-02 NOTE — Progress Notes (Signed)
Patient ID: Antonio Carrillo, male   DOB: [birth year], 75 y.o.   MRN: [id number]     Primary Care Physician: Pcp Not In System Referring Physician: Dr. Rayann Heman Cardiologist: Dr. Joesphine Bare is a 75 y.o. male with a h/o atrial fibrillation, HTN, HL and ascending Aortic aneurysm s/p repair. Previously followed by Dr Haroldine Laws and followed by Dr Rayann Heman for atrial fibrillation. Carotid Dopplers at [facility] clinic February 2016 showed 20-39% bilateral stenosis. Echocardiogram March 2016 showed normal LV function. There was biatrial enlargement. Moderate pericardial effusion. Status post ascending aorta repair.   He was diagnosed with atrial fibrillation in 1977. He failed medical therapy with flecainide, rhythmol and multaq. Not tried on TIkosyn or sotalol due to prolonged QT. He underwent atrial flutter ablation in [place] late 1990s. Patient has had 3 previous atrial fibrillation ablations. In February 2016 the patient had aortic root replacement, one artery bypass graft 1, pulmonary vein isolation and left atrial appendage ligation at [facility] clinic.  He is in the afib clinic today to get established. He reports no episodes of afib since maze procedure 2/16. Overall, he is feeling well. No issues with eliquis for which he is compliant. He recently established with Dr. [name] and monitoring labs for amiodarone were ordered but never done,  Echo done and chest xray ordered but not  Performed.  He was given an appointment with Dr. Rayann Heman 9/16 to reestablish. The only change was to decrease amiodarone to 200 mg once a day.  He asked to be seen in clinic today because he said that his heart rate has been elevated, but regular, for a couple of weeks and he feels like he has  85% of his energy. He actually called to the office yesterday and informed of elevated heart rate and metoprolol was added 25 mg last pm and this am but without any significant change in heart rate.He continues on  eliquis.  Today, he denies symptoms of palpitations, chest pain, shortness of breath, orthopnea, PND, lower extremity edema, dizziness, presyncope, syncope, or neurologic sequela. The patient is tolerating medications without difficulties and is otherwise without complaint today.   Past Medical History  Diagnosis Date  . Anticoagulants causing adverse effect in therapeutic use   . Persistent atrial fibrillation (HCC)     s/p ablations x3  . Hyperthyroidism     resolved per pt  . Testicular failure   . Aortic aneurysm [facility])     [facility]  . HTN (hypertension)   . Hyperlipidemia   . Lymphoma, low grade [facility])    Past Surgical History  Procedure Laterality Date  . Laminectomy      x2  . Appendectomy    . Total knee arthroplasty      bilateral  . Atrial ablation surgery  1. late 1990's  2. 02/01/06  3. 10/10    1. In [place]  2. At [facility]  3. Redo by Greggory Brandy [facility])  . Stress cardiolite  01/29/06  . Transthoracic echocardiogram  02/08/06  . Cardioversion N/A 09/16/2012    Procedure: CARDIOVERSION;  Surgeon: Jolaine Artist, MD;  Location: [facility] ENDOSCOPY;  Service: Cardiovascular;  Laterality: N/A;  angie will call back /heather [phone]  . Tonsillectomy    . Back surgery    . Thoracic aortic aneurysm repair  2/16    anomalous RCA also repaired at [facility]    Current Outpatient Prescriptions  Medication Sig Dispense Refill  . amiodarone (PACERONE) 200 MG tablet  Take 1 tablet (200 mg total) by mouth 2 (two) times daily. 60 tablet 3  . amLODipine (NORVASC) 5 MG tablet Take 1 tablet by mouth daily.    Marland Kitchen ELIQUIS 5 MG TABS tablet TAKE 1 TABLET BY MOUTH TWICE A DAY 180 tablet 3  . LORazepam (ATIVAN) 0.5 MG tablet Take 1 tablet by mouth 2 (two) times daily.    Marland Kitchen losartan (COZAAR) 100 MG tablet TAKE 1 TABLET BY MOUTH ONCE DAILY 90 tablet 0  . lovastatin (MEVACOR) 20 MG tablet TAKE ONE TABLET BY MOUTH AT BEDTIME 90 tablet 2  . metoprolol tartrate (LOPRESSOR) 25 MG tablet Take  1/2 tablet by mouth for heart rate over 100    . pantoprazole (PROTONIX) 20 MG tablet TAKE ONE TABLET BY MOUTH EVERY DAY 30 tablet 3  . Probiotic Product (FLORAJEN3 PO) Take 2 capsules by mouth daily.    Marland Kitchen zolpidem (AMBIEN CR) 12.5 MG CR tablet Take 1 tablet by mouth at bedtime as needed. Sleep     No current facility-administered medications for this encounter.    No Known Allergies  Social History   Social History  . Marital Status: Married    Spouse Name: N/A  . Number of Children: N/A  . Years of Education: N/A   Occupational History  . 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 Smokeless tobacco: Not on file  . Alcohol Use: Not on file  . Drug Use: Not on file  . Sexual Activity: Not on file   Other Topics Concern  . Not on file   Social History Narrative   Lives in [place].    Family History  Problem Relation Age of Onset  . Arrhythmia Mother     ROS- All systems are reviewed and negative except as per the HPI above  Physical Exam: Filed Vitals:   07/02/15 1030  BP: 140/96  Pulse: 86  Height: 6\' 3"  (1.905 m)  Weight: 239 lb 9.6 oz (108.682 kg)    GEN- The patient is well appearing, alert and oriented x 3 today.   Head- normocephalic, atraumatic Eyes-  Sclera clear, conjunctiva pink Ears- hearing intact Oropharynx- clear Neck- supple, no JVP Lymph- no cervical lymphadenopathy Lungs- Clear to ausculation bilaterally, normal work of breathing Heart- Regular rate and rhythm, no murmurs, rubs or gallops, PMI not laterally displaced GI- soft, NT, ND, + BS Extremities- no clubbing, cyanosis, or edema MS- no significant deformity or atrophy Skin- no rash or lesion Psych- euthymic mood, full affect Neuro- strength and sensation are intact  EKG-  Atrial ectopic rhtyhm at 86 bpm, Pr int 256 ms, QRS int 180 ms, qtc 572 ms( h/o long qt) Ekg's reviewed in chart, qtc all over 500 ms   Echo-Left  ventricle: The cavity size was mildly dilated. Systolic function was normal. The estimated ejection fraction was in the range of 60% to 65%. Wall motion was normal; there were no regional wall motion abnormalities. Doppler parameters are consistent with a reversible restrictive pattern, indicative of decreased left ventricular diastolic compliance and/or increased left atrial pressure (grade 3 diastolic dysfunction). Doppler parameters are consistent with elevated ventricular end-diastolic filling pressure. - Aortic valve: There was mild regurgitation. Regurgitation pressure half-time: 680 ms. - Aorta: Ascending aortic diameter: 41 mm (S). - Aortic root: The aortic root was dilated. - Ascending aorta: The ascending aorta was moderately dilated. - Mitral valve: Transvalvular velocity was within the  normal range. There was no evidence for stenosis. There was trivial regurgitation. - Left atrium: The atrium was severely dilated. - Right ventricle: The cavity size was normal. Wall thickness was normal. Systolic function was normal. - Right atrium: The atrium was severely dilated. - Atrial septum: No defect or patent foramen ovale was identified. - Pulmonic valve: There was mild regurgitation. - Inferior vena cava: The vessel was normal in size. The respirophasic diameter changes were in the normal range (>= 50%), consistent with normal central venous pressure.   Assessment and Plan: 1. Atrial ectopic rhythm Stop metoprolol that was started yesterday Increase amiodarone to 200 mg bid  Continue eliquis 5 mg bid Can take an extra 1/2 of 25 mg tab metoprolol if HR gets over 100 bpm.  F/u in afib clinic in one week if does not return to [facility], consider [place]. Mia Milan, [facility] [address] [place], [place] [address] [phone]

## 2015-07-06 ENCOUNTER — Other Ambulatory Visit: Payer: Self-pay | Admitting: Cardiology

## 2015-07-07 NOTE — Telephone Encounter (Signed)
Rx(s) sent to pharmacy electronically.  

## 2015-07-09 ENCOUNTER — Encounter (HOSPITAL_COMMUNITY): Payer: Self-pay | Admitting: Nurse Practitioner

## 2015-07-09 ENCOUNTER — Ambulatory Visit (HOSPITAL_COMMUNITY)
Admission: RE | Admit: 2015-07-09 | Discharge: 2015-07-09 | Disposition: A | Discharge status: Home / Self Care | Payer: Medicare Other | Source: Ambulatory Visit | Attending: Nurse Practitioner | Admitting: Nurse Practitioner

## 2015-07-09 VITALS — BP 138/82 | HR 93 | Ht 75.0 in | Wt 238.6 lb

## 2015-07-09 DIAGNOSIS — E785 Hyperlipidemia, unspecified: Secondary | ICD-10-CM | POA: Insufficient documentation

## 2015-07-09 DIAGNOSIS — Z8572 Personal history of non-Hodgkin lymphomas: Secondary | ICD-10-CM | POA: Insufficient documentation

## 2015-07-09 DIAGNOSIS — I498 Other specified cardiac arrhythmias: Secondary | ICD-10-CM

## 2015-07-09 DIAGNOSIS — Z7901 Long term (current) use of anticoagulants: Secondary | ICD-10-CM | POA: Insufficient documentation

## 2015-07-09 DIAGNOSIS — I491 Atrial premature depolarization: Secondary | ICD-10-CM | POA: Diagnosis not present

## 2015-07-09 DIAGNOSIS — I10 Essential (primary) hypertension: Secondary | ICD-10-CM | POA: Insufficient documentation

## 2015-07-09 DIAGNOSIS — Z79899 Other long term (current) drug therapy: Secondary | ICD-10-CM | POA: Insufficient documentation

## 2015-07-09 DIAGNOSIS — Z8249 Family history of ischemic heart disease and other diseases of the circulatory system: Secondary | ICD-10-CM | POA: Insufficient documentation

## 2015-07-09 DIAGNOSIS — I4891 Unspecified atrial fibrillation: Secondary | ICD-10-CM | POA: Diagnosis present

## 2015-07-09 LAB — COMPREHENSIVE METABOLIC PANEL
ALBUMIN: 4 g/dL (ref 3.5–5.0)
ALT: 39 U/L (ref 17–63)
AST: 46 U/L — AB (ref 15–41)
Alkaline Phosphatase: 73 U/L (ref 38–126)
Anion gap: 7 (ref 5–15)
BUN: 18 mg/dL (ref 6–20)
CHLORIDE: 103 mmol/L (ref 101–111)
CO2: 28 mmol/L (ref 22–32)
CREATININE: 0.85 mg/dL (ref 0.61–1.24)
Calcium: 9.3 mg/dL (ref 8.9–10.3)
GFR calc Af Amer: >60 mL/min (ref >60)
GFR calc non Af Amer: >60 mL/min (ref >60)
Glucose, Bld: 135 mg/dL — ABNORMAL HIGH (ref 65–99)
POTASSIUM: 4.3 mmol/L (ref 3.5–5.1)
Sodium: 138 mmol/L (ref 135–145)
Total Bilirubin: 1.1 mg/dL (ref 0.3–1.2)
Total Protein: 6.3 g/dL — ABNORMAL LOW (ref 6.5–8.1)

## 2015-07-09 LAB — CBC
HEMATOCRIT: 42 % (ref 39.0–52.0)
Hemoglobin: 13.8 g/dL (ref 13.0–17.0)
MCH: 29.3 pg (ref 26.0–34.0)
MCHC: 32.9 g/dL (ref 30.0–36.0)
MCV: 89.2 fL (ref 78.0–100.0)
PLATELETS: 142 10*3/uL — AB (ref 150–400)
RBC: 4.71 MIL/uL (ref 4.22–5.81)
RDW: 15.8 % — AB (ref 11.5–15.5)
WBC: 7.5 10*3/uL (ref 4.0–10.5)

## 2015-07-09 LAB — TSH: TSH: 2.256 u[IU]/mL (ref 0.350–4.500)

## 2015-07-09 NOTE — Progress Notes (Signed)
Patient ID: Antonio Carrillo, male   DOB: 19-Jan-1941, 75 y.o.   MRN: [id number]     Primary Care Physician: Pcp Not In System Referring Physician: Dr. Rayann Heman Cardiologist: Dr. Joesphine Bare is a 75 y.o. male with a h/o atrial fibrillation, HTN, HL and ascending Aortic aneurysm s/p repair. Previously followed by Dr Haroldine Laws and followed by Dr Rayann Heman for atrial fibrillation. Carotid Dopplers at [facility] clinic February 2016 showed 20-39% bilateral stenosis. Echocardiogram March 2016 showed normal LV function. There was biatrial enlargement. Moderate pericardial effusion. Status post ascending aorta repair.   He was diagnosed with atrial fibrillation in 1977. He failed medical therapy with flecainide, rhythmol and multaq. Not tried on TIkosyn or sotalol due to prolonged QT. He underwent atrial flutter ablation in [place] late 1990s. Patient has had 3 previous atrial fibrillation ablations. In February 2016 the patient had aortic root replacement, one artery bypass graft 1, pulmonary vein isolation and left atrial appendage ligation at [facility] clinic.  He is in the afib clinic 12/12, today to get established. He reports no episodes of afib since  procedures 2/16. Overall, he is feeling well. No issues with eliquis for which he is compliant. He recently established with Dr. [name] and monitoring labs for amiodarone were ordered but never done,  Echo done and chest xray ordered but not performed.  Returns to afib clinic/ 4/7, to f/u on ectopic atrial rhythm from last week. Amiodarone was increased to 200 mg bid, but with no improvement. Dr. Rayann Heman reviewed EKG and feels cardioversion would be reasonable to try to restore SR. Pt does not feel as energetic in this rhythm. HR usually runs around 50-60. Pt has had multiple cardioversion's in the past.  Today, he denies symptoms of palpitations, chest pain,  orthopnea, PND, lower extremity edema, dizziness, presyncope, syncope, or neurologic  sequela. MiIld fatigue and shortness of breath. The patient is tolerating medications without difficulties and is otherwise without complaint today.   Past Medical History  Diagnosis Date  . Anticoagulants causing adverse effect in therapeutic use   . Persistent atrial fibrillation (HCC)     s/p ablations x3  . Hyperthyroidism     resolved per pt  . Testicular failure   . Aortic aneurysm [facility])     s/p [facility] at [facility]  . HTN (hypertension)   . Hyperlipidemia   . 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 Laminectomy      x2  . Appendectomy    . Total knee arthroplasty      bilateral  . Atrial ablation surgery  1. late 1990's  2. 02/01/06  3. 10/10    1. In [place]  2. At [facility]  3. Redo by Greggory Brandy [facility])  . Stress cardiolite  01/29/06  . Transthoracic echocardiogram  02/08/06  . Cardioversion N/A 09/16/2012    Procedure: CARDIOVERSION;  Surgeon: Jolaine Artist, MD;  Location: [facility] ENDOSCOPY;  Service: Cardiovascular;  Laterality: N/A;  angie will call back /heather [phone]  . Tonsillectomy    . Back surgery    . Thoracic aortic aneurysm repair  2/16    anomalous RCA also repaired at [facility]    Current Outpatient Prescriptions  Medication Sig Dispense Refill  . amiodarone (PACERONE) 200 MG tablet Take 1 tablet (200 mg total) by mouth 2 (two) times daily. 60 tablet 3  . amLODipine (NORVASC) 5 MG tablet Take 1 tablet by mouth daily.    Marland Kitchen  ELIQUIS 5 MG TABS tablet TAKE 1 TABLET BY MOUTH TWICE A DAY 180 tablet 3  . LORazepam (ATIVAN) 0.5 MG tablet Take 1 tablet by mouth 2 (two) times daily.    Marland Kitchen losartan (COZAAR) 100 MG tablet TAKE 1 TABLET BY MOUTH ONCE DAILY 90 tablet 0  . lovastatin (MEVACOR) 20 MG tablet TAKE ONE TABLET BY MOUTH WITH EVENING MEAL 90 tablet 1  . metoprolol tartrate (LOPRESSOR) 25 MG tablet Take 1/2 tablet by mouth for heart rate over 100    . pantoprazole (PROTONIX) 20 MG tablet TAKE ONE TABLET BY MOUTH EVERY DAY 30  tablet 3  . Probiotic Product (FLORAJEN3 PO) Take 2 capsules by mouth daily.    Marland Kitchen zolpidem (AMBIEN CR) 12.5 MG CR tablet Take 1 tablet by mouth at bedtime as needed. Sleep     No current facility-administered medications for this encounter.    No Known Allergies  Social History   Social History  . Marital Status: Married    Spouse Name: N/A  . Number of Children: N/A  . Years of Education: N/A   Occupational History  . 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 Smoking status: Never Smoker   . Smokeless tobacco: Not on file  . Alcohol Use: Not on file  . Drug Use: Not on file  . Sexual Activity: Not on file   Other Topics Concern  . Not on file   Social History Narrative   Lives in [place].    Family History  Problem Relation Age of Onset  . Arrhythmia Mother     ROS- All systems are reviewed and negative except as per the HPI above  Physical Exam: Filed Vitals:   07/09/15 1015  BP: 138/82  Pulse: 93  Height: 6\' 3"  (1.905 m)  Weight: 238 lb 9.6 oz (108.228 kg)    GEN- The patient is well appearing, alert and oriented x 3 today.   Head- normocephalic, atraumatic Eyes-  Sclera clear, conjunctiva pink Ears- hearing intact Oropharynx- clear Neck- supple, no JVP Lymph- no cervical lymphadenopathy Lungs- Clear to ausculation bilaterally, normal work of breathing Heart- Regular rate and rhythm, no murmurs, rubs or gallops, PMI not laterally displaced GI- soft, NT, ND, + BS Extremities- no clubbing, cyanosis, or edema MS- no significant deformity or atrophy Skin- no rash or lesion Psych- euthymic mood, full affect Neuro- strength and sensation are intact  EKG-  Possible ectopic atrial rhythm, RBBB, pr int 154 ms, Qrs int 172 ms, Qtc 552 ms   Echo-Left ventricle: The cavity size was mildly dilated. Systolic function was normal. The estimated ejection fraction was in the range of 60% to 65%. Wall motion was normal; there were  no regional wall motion abnormalities. Doppler parameters are consistent with a reversible restrictive pattern, indicative of decreased left ventricular diastolic compliance and/or increased left atrial pressure (grade 3 diastolic dysfunction). Doppler parameters are consistent with elevated ventricular end-diastolic filling pressure. - Aortic valve: There was mild regurgitation. Regurgitation pressure half-time: 680 ms. - Aorta: Ascending aortic diameter: 41 mm (S). - Aortic root: The aortic root was dilated. - Ascending aorta: The ascending aorta was moderately dilated. - Mitral valve: Transvalvular velocity was within the normal range. There was no evidence for stenosis. There was trivial regurgitation. - Left atrium: The atrium was severely dilated. - Right ventricle: The cavity size was normal. Wall thickness was normal. Systolic function was normal. - Right atrium: The atrium was severely dilated. -  Atrial septum: No defect or patent foramen ovale was identified. - Pulmonic valve: There was mild regurgitation. - Inferior vena cava: The vessel was normal in size. The respirophasic diameter changes were in the normal range (>= 50%), consistent with normal central venous pressure.   Assessment and Plan: 1. Symptomatic ectopic atrial rhythm Schedule for cardioversion Has not missed any doses of eliquis Continue amiodarone at 200 mg bid, but reduce to 200 mg qd after cardioversion Cmet,tsh,cbc today    He requests f/u with Dr. Rayann Heman and appointment has been made for 4/28 Also has f/u with Dr. Haroldine Laws and Dr. [name] 4/26  Antonio Carrillo. Antonio Carrillo, [facility] [address] [place], [place] [address] [phone]

## 2015-07-09 NOTE — Patient Instructions (Signed)
Cardioversion scheduled for Thursday, April 13th  - Arrive at the [facility] and go to admitting at [place] not eat or drink anything after midnight the night prior to your procedure.  - Take all your medication with a sip of water prior to arrival.  - You will not be able to drive home after your procedure.  After cardioversion decrease Amiodarone to 200mg  once a day  Scheduler will be in touch regarding follow up with Dr. Rayann Heman

## 2015-07-14 NOTE — Anesthesia Preprocedure Evaluation (Addendum)
Anesthesia Evaluation  Patient identified by MRN, date of birth, ID band Patient awake    Reviewed: Allergy & Precautions, H&P , NPO status , Patient's Chart, lab work & pertinent test results  History of Anesthesia Complications Negative for: history of anesthetic complications  Airway Mallampati: III  TM Distance: >3 FB Neck ROM: Full    Dental  (+) Teeth Intact, Dental Advisory Given   Pulmonary shortness of breath (Related to afib.),    breath sounds clear to auscultation       Cardiovascular hypertension, Pt. on medications CABG: 2016   + dysrhythmias Atrial Fibrillation  Rhythm:Irregular Rate:Tachycardia  ECHO 01/2015 EF 65%, SP aortic root repair   Neuro/Psych CVA    GI/Hepatic negative GI ROS, Neg liver ROS,   Endo/Other  Hyperthyroidism   Renal/GU negative Renal ROS     Musculoskeletal   Abdominal   Peds  Hematology negative hematology ROS (+) HX Lymphoma   Anesthesia Other Findings   Reproductive/Obstetrics negative OB ROS                            Anesthesia Physical Anesthesia Plan  ASA: III  Anesthesia Plan: MAC   Post-op Pain Management:    Induction: Intravenous  Airway Management Planned: Mask and Nasal Cannula  Additional Equipment:   Intra-op Plan:   Post-operative Plan:   Informed Consent: I have reviewed the patients History and Physical, chart, labs and discussed the procedure including the risks, benefits and alternatives for the proposed anesthesia with the patient or authorized representative who has indicated his/her understanding and acceptance.     Plan Discussed with:   Anesthesia Plan Comments:         Anesthesia Quick Evaluation

## 2015-07-15 ENCOUNTER — Encounter (HOSPITAL_COMMUNITY): Payer: Self-pay

## 2015-07-15 ENCOUNTER — Ambulatory Visit (HOSPITAL_COMMUNITY)
Admission: RE | Admit: 2015-07-15 | Discharge: 2015-07-15 | Disposition: A | Discharge status: Home / Self Care | Payer: Medicare Other | Source: Ambulatory Visit | Attending: Cardiovascular Disease | Admitting: Cardiovascular Disease

## 2015-07-15 ENCOUNTER — Ambulatory Visit (HOSPITAL_COMMUNITY): Payer: Medicare Other | Admitting: Anesthesiology

## 2015-07-15 ENCOUNTER — Encounter (HOSPITAL_COMMUNITY)
Admission: RE | Disposition: A | Discharge status: Home / Self Care | Payer: Self-pay | Source: Ambulatory Visit | Attending: Cardiovascular Disease

## 2015-07-15 DIAGNOSIS — I499 Cardiac arrhythmia, unspecified: Secondary | ICD-10-CM | POA: Diagnosis present

## 2015-07-15 DIAGNOSIS — Z8673 Personal history of transient ischemic attack (TIA), and cerebral infarction without residual deficits: Secondary | ICD-10-CM | POA: Diagnosis not present

## 2015-07-15 DIAGNOSIS — Z79899 Other long term (current) drug therapy: Secondary | ICD-10-CM | POA: Diagnosis not present

## 2015-07-15 DIAGNOSIS — I498 Other specified cardiac arrhythmias: Secondary | ICD-10-CM | POA: Insufficient documentation

## 2015-07-15 DIAGNOSIS — Z7901 Long term (current) use of anticoagulants: Secondary | ICD-10-CM | POA: Insufficient documentation

## 2015-07-15 DIAGNOSIS — I10 Essential (primary) hypertension: Secondary | ICD-10-CM | POA: Insufficient documentation

## 2015-07-15 DIAGNOSIS — Z8572 Personal history of non-Hodgkin lymphomas: Secondary | ICD-10-CM | POA: Insufficient documentation

## 2015-07-15 DIAGNOSIS — Z951 Presence of aortocoronary bypass graft: Secondary | ICD-10-CM | POA: Insufficient documentation

## 2015-07-15 DIAGNOSIS — Z96653 Presence of artificial knee joint, bilateral: Secondary | ICD-10-CM | POA: Insufficient documentation

## 2015-07-15 DIAGNOSIS — E785 Hyperlipidemia, unspecified: Secondary | ICD-10-CM | POA: Insufficient documentation

## 2015-07-15 HISTORY — PX: CARDIOVERSION: SHX1299

## 2015-07-15 SURGERY — CARDIOVERSION
Anesthesia: Monitor Anesthesia Care

## 2015-07-15 MED ORDER — SODIUM CHLORIDE 0.9 % IV SOLN
INTRAVENOUS | Status: DC
  Administered 2015-07-15: 14:00:00 via INTRAVENOUS
  Administered 2015-07-15: 500 mL via INTRAVENOUS

## 2015-07-15 MED ORDER — PROPOFOL 10 MG/ML IV BOLUS
INTRAVENOUS | Status: DC | PRN
  Administered 2015-07-15: 40 mg via INTRAVENOUS
  Administered 2015-07-15: 25 mg via INTRAVENOUS

## 2015-07-15 MED ORDER — LIDOCAINE HCL (CARDIAC) 20 MG/ML IV SOLN
INTRAVENOUS | Status: DC | PRN
  Administered 2015-07-15: 40 mg via INTRATRACHEAL

## 2015-07-15 MED ORDER — AMIODARONE HCL 200 MG PO TABS: 200.0000 mg | ORAL_TABLET | Freq: Two times a day (BID) | ORAL | Status: DC

## 2015-07-15 NOTE — H&P (View-Only) (Signed)
Patient ID: Antonio Carrillo, male   DOB: 04/29/1940, 75 y.o.   MRN: [id number]     Primary Care Physician: Pcp Not In System Referring Physician: Dr. Rayann Heman Cardiologist: Dr. Joesphine Bare is a 75 y.o. male with a h/o atrial fibrillation, HTN, HL and ascending Aortic aneurysm s/p repair. Previously followed by Dr Haroldine Laws and followed by Dr Rayann Heman for atrial fibrillation. Carotid Dopplers at [facility] clinic February 2016 showed 20-39% bilateral stenosis. Echocardiogram March 2016 showed normal LV function. There was biatrial enlargement. Moderate pericardial effusion. Status post ascending aorta repair.   He was diagnosed with atrial fibrillation in 1977. He failed medical therapy with flecainide, rhythmol and multaq. Not tried on TIkosyn or sotalol due to prolonged QT. He underwent atrial flutter ablation in [place] late 1990s. Patient has had 3 previous atrial fibrillation ablations. In February 2016 the patient had aortic root replacement, one artery bypass graft 1, pulmonary vein isolation and left atrial appendage ligation at [facility] clinic.  He is in the afib clinic 12/12, today to get established. He reports no episodes of afib since  procedures 2/16. Overall, he is feeling well. No issues with eliquis for which he is compliant. He recently established with Dr. [name] and monitoring labs for amiodarone were ordered but never done,  Echo done and chest xray ordered but not performed.  Returns to afib clinic/ 4/7, to f/u on ectopic atrial rhythm from last week. Amiodarone was increased to 200 mg bid, but with no improvement. Dr. Rayann Heman reviewed EKG and feels cardioversion would be reasonable to try to restore SR. Pt does not feel as energetic in this rhythm. HR usually runs around 50-60. Pt has had multiple cardioversion's in the past.  Today, he denies symptoms of palpitations, chest pain,  orthopnea, PND, lower extremity edema, dizziness, presyncope, syncope, or neurologic  sequela. MiIld fatigue and shortness of breath. The patient is tolerating medications without difficulties and is otherwise without complaint today.   Past Medical History  Diagnosis Date  . Anticoagulants causing adverse effect in therapeutic use   . Persistent atrial fibrillation (HCC)     s/p ablations x3  . Hyperthyroidism     resolved per pt  . Testicular failure   . Aortic aneurysm [facility])     s/p surgery at [facility]  . HTN (hypertension)   . Hyperlipidemia   . 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 Laminectomy      x2  . Appendectomy    . Total knee arthroplasty      bilateral  . Atrial ablation surgery  1. late 1990's  2. 02/01/06  3. 10/10    1. In [place]  2. At [facility]  3. Redo by Greggory Brandy [facility])  . Stress cardiolite  01/29/06  . Transthoracic echocardiogram  02/08/06  . Cardioversion N/A 09/16/2012    Procedure: CARDIOVERSION;  Surgeon: Jolaine Artist, MD;  Location: [facility] ENDOSCOPY;  Service: Cardiovascular;  Laterality: N/A;  angie will call back /heather [phone]  . Tonsillectomy    . Back surgery    . Thoracic aortic aneurysm repair  2/16    anomalous RCA also repaired at [facility]    Current Outpatient Prescriptions  Medication Sig Dispense Refill  . amiodarone (PACERONE) 200 MG tablet Take 1 tablet (200 mg total) by mouth 2 (two) times daily. 60 tablet 3  . amLODipine (NORVASC) 5 MG tablet Take 1 tablet by mouth daily.    Marland Kitchen  ELIQUIS 5 MG TABS tablet TAKE 1 TABLET BY MOUTH TWICE A DAY 180 tablet 3  . LORazepam (ATIVAN) 0.5 MG tablet Take 1 tablet by mouth 2 (two) times daily.    Marland Kitchen losartan (COZAAR) 100 MG tablet TAKE 1 TABLET BY MOUTH ONCE DAILY 90 tablet 0  . lovastatin (MEVACOR) 20 MG tablet TAKE ONE TABLET BY MOUTH WITH EVENING MEAL 90 tablet 1  . metoprolol tartrate (LOPRESSOR) 25 MG tablet Take 1/2 tablet by mouth for heart rate over 100    . pantoprazole (PROTONIX) 20 MG tablet TAKE ONE TABLET BY MOUTH EVERY DAY 30  tablet 3  . Probiotic Product (FLORAJEN3 PO) Take 2 capsules by mouth daily.    Marland Kitchen zolpidem (AMBIEN CR) 12.5 MG CR tablet Take 1 tablet by mouth at bedtime as needed. Sleep     No current facility-administered medications for this encounter.    No Known Allergies  Social History   Social History  . Marital Status: Married    Spouse Name: N/A  . Number of Children: N/A  . Years of Education: N/A   Occupational History  . 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 Smoking status: Never Smoker   . Smokeless tobacco: Not on file  . Alcohol Use: Not on file  . Drug Use: Not on file  . Sexual Activity: Not on file   Other Topics Concern  . Not on file   Social History Narrative   Lives in [place].    Family History  Problem Relation Age of Onset  . Arrhythmia Mother     ROS- All systems are reviewed and negative except as per the HPI above  Physical Exam: Filed Vitals:   07/09/15 1015  BP: 138/82  Pulse: 93  Height: 6\' 3"  (1.905 m)  Weight: 238 lb 9.6 oz (108.228 kg)    GEN- The patient is well appearing, alert and oriented x 3 today.   Head- normocephalic, atraumatic Eyes-  Sclera clear, conjunctiva pink Ears- hearing intact Oropharynx- clear Neck- supple, no JVP Lymph- no cervical lymphadenopathy Lungs- Clear to ausculation bilaterally, normal work of breathing Heart- Regular rate and rhythm, no murmurs, rubs or gallops, PMI not laterally displaced GI- soft, NT, ND, + BS Extremities- no clubbing, cyanosis, or edema MS- no significant deformity or atrophy Skin- no rash or lesion Psych- euthymic mood, full affect Neuro- strength and sensation are intact  EKG-  Possible ectopic atrial rhythm, RBBB, pr int 154 ms, Qrs int 172 ms, Qtc 552 ms   Echo-Left ventricle: The cavity size was mildly dilated. Systolic function was normal. The estimated ejection fraction was in the range of 60% to 65%. Wall motion was normal; there were  no regional wall motion abnormalities. Doppler parameters are consistent with a reversible restrictive pattern, indicative of decreased left ventricular diastolic compliance and/or increased left atrial pressure (grade 3 diastolic dysfunction). Doppler parameters are consistent with elevated ventricular end-diastolic filling pressure. - Aortic valve: There was mild regurgitation. Regurgitation pressure half-time: 680 ms. - Aorta: Ascending aortic diameter: 41 mm (S). - Aortic root: The aortic root was dilated. - Ascending aorta: The ascending aorta was moderately dilated. - Mitral valve: Transvalvular velocity was within the normal range. There was no evidence for stenosis. There was trivial regurgitation. - Left atrium: The atrium was severely dilated. - Right ventricle: The cavity size was normal. Wall thickness was normal. Systolic function was normal. - Right atrium: The atrium was severely dilated. -  Atrial septum: No defect or patent foramen ovale was identified. - Pulmonic valve: There was mild regurgitation. - Inferior vena cava: The vessel was normal in size. The respirophasic diameter changes were in the normal range (>= 50%), consistent with normal central venous pressure.   Assessment and Plan: 1. Symptomatic ectopic atrial rhythm Schedule for cardioversion Has not missed any doses of eliquis Continue amiodarone at 200 mg bid, but reduce to 200 mg qd after cardioversion Cmet,tsh,cbc today    He requests f/u with Dr. Rayann Heman and appointment has been made for 4/28 Also has f/u with Dr. Haroldine Laws and Dr. [name] 4/26  Geroge Baseman. Murial Beam, [facility] [address] [place], [place] [address] [phone]

## 2015-07-15 NOTE — Interval H&P Note (Signed)
History and Physical Interval Note: No interval changes. Will proceed with DCCV as planned.  07/15/2015 10:38 AM  Windy Canny  has presented today for surgery, with the diagnosis of AFIB  The various methods of treatment have been discussed with the patient and family. After consideration of risks, benefits and other options for treatment, the patient has consented to  Procedure(s): CARDIOVERSION (N/A) as a surgical intervention .  The patient's history has been reviewed, patient examined, no change in status, stable for surgery.  I have reviewed the patient's chart and labs.  Questions were answered to the patient's satisfaction.     Kate Sable A

## 2015-07-15 NOTE — Procedures (Signed)
Elective direct current cardioversion  Indication: Ectopic atrial rhythm  Description of procedure: After informed consent was obtained and preprocedure evaluation by Anesthesia, patient was taken to the procedure room. Timeout performed. Sedation was managed completely by the Anesthesia service, please refer to their documentation for details. Anterior and posterior chest pads were placed and connected to a biphasic defibrillator. A single synchronized shock of 150J was delivered resulting in restoration of sinus rhythm. The patient remained hemodynamically stable throughout, and there were no immediate complications noted. Follow-up ECG obtained.  Kate Sable, M.D., F.A.C.C.

## 2015-07-15 NOTE — Anesthesia Postprocedure Evaluation (Signed)
Anesthesia Post Note  Patient: Antonio Carrillo  Procedure(s) Performed: Procedure(s) (LRB): CARDIOVERSION (N/A)  Patient location during evaluation: Endoscopy Anesthesia Type: MAC Level of consciousness: awake and alert Pain management: pain level controlled Vital Signs Assessment: post-procedure vital signs reviewed and stable Respiratory status: spontaneous breathing, nonlabored ventilation, respiratory function stable and patient connected to nasal cannula oxygen Cardiovascular status: stable and blood pressure returned to baseline Anesthetic complications: no    Last Vitals:  Filed Vitals:   07/15/15 1253 07/15/15 1311  BP: 174/100 160/96  Pulse: 93   Temp: 36.6 C   Resp: 20     Last Pain: There were no vitals filed for this visit.               Alexis Frock

## 2015-07-15 NOTE — Transfer of Care (Signed)
Immediate Anesthesia Transfer of Care Note  Patient: Antonio Carrillo  Procedure(s) Performed: Procedure(s): CARDIOVERSION (N/A)  Patient Location: Endoscopy Unit  Anesthesia Type:General  Level of Consciousness: awake, alert , oriented and patient cooperative  Airway & Oxygen Therapy: Patient Spontanous Breathing and Patient connected to nasal cannula oxygen  Post-op Assessment: Report given to RN and Post -op Vital signs reviewed and stable  Post vital signs: Reviewed and stable  Last Vitals:  Filed Vitals:   07/15/15 1311 07/15/15 1401  BP: 160/96 122/61  Pulse:  57  Temp:    Resp:  14    Complications: No apparent anesthesia complications

## 2015-07-15 NOTE — Discharge Instructions (Signed)
Electrical Cardioversion, Care After °Refer to this sheet in the next few weeks. These instructions provide you with information on caring for yourself after your procedure. Your health care provider may also give you more specific instructions. Your treatment has been planned according to current medical practices, but problems sometimes occur. Call your health care provider if you have any problems or questions after your procedure. °WHAT TO EXPECT AFTER THE PROCEDURE °After your procedure, it is typical to have the following sensations: °· Some redness on the skin where the shocks were delivered. If this is tender, a sunburn lotion or hydrocortisone cream may help. °· Possible return of an abnormal heart rhythm within hours or days after the procedure. °HOME CARE INSTRUCTIONS °· Take medicines only as directed by your health care provider. Be sure you understand how and when to take your medicine. °· Learn how to feel your pulse and check it often. °· Limit your activity for 48 hours after the procedure or as directed by your health care provider. °· Avoid or minimize caffeine and other stimulants as directed by your health care provider. °SEEK MEDICAL CARE IF: °· You feel like your heart is beating too fast or your pulse is not regular. °· You have any questions about your medicines. °· You have bleeding that will not stop. °SEEK IMMEDIATE MEDICAL CARE IF: °· You are dizzy or feel faint. °· It is hard to breathe or you feel short of breath. °· There is a change in discomfort in your chest. °· Your speech is slurred or you have trouble moving an arm or leg on one side of your body. °· You get a serious muscle cramp that does not go away. °· Your fingers or toes turn cold or blue. °  °This information is not intended to replace advice given to you by your health care provider. Make sure you discuss any questions you have with your health care provider. °  °Document Released: 01/08/2013 Document Revised: 04/10/2014  Document Reviewed: 01/08/2013 °Elsevier Interactive Patient Education ©2016 [employer]vier Inc. ° °

## 2015-07-16 ENCOUNTER — Telehealth: Payer: Self-pay | Admitting: Cardiology

## 2015-07-16 NOTE — Telephone Encounter (Signed)
Pt needs order for CXR -was told to have before next appt-pls call when placed at [phone] or [phone]

## 2015-07-16 NOTE — Telephone Encounter (Signed)
1212pm 07-16-15 pt called back-pt given Melinda's message

## 2015-07-16 NOTE — Telephone Encounter (Signed)
Spoke with [facility] Order in Epic and patient can go to Radiology at [facility] Monday-Friday 8-5 or may call and schedule time at [phone] Left message to call back

## 2015-07-19 ENCOUNTER — Encounter (HOSPITAL_COMMUNITY): Payer: Self-pay | Admitting: Cardiovascular Disease

## 2015-07-22 ENCOUNTER — Ambulatory Visit (INDEPENDENT_AMBULATORY_CARE_PROVIDER_SITE_OTHER): Payer: Medicare Other

## 2015-07-22 DIAGNOSIS — I4892 Unspecified atrial flutter: Secondary | ICD-10-CM

## 2015-07-26 ENCOUNTER — Ambulatory Visit (HOSPITAL_COMMUNITY)
Admission: RE | Admit: 2015-07-26 | Discharge: 2015-07-26 | Disposition: A | Discharge status: Home / Self Care | Payer: Medicare Other | Source: Ambulatory Visit | Attending: Cardiovascular Disease | Admitting: Cardiovascular Disease

## 2015-07-26 DIAGNOSIS — I10 Essential (primary) hypertension: Secondary | ICD-10-CM | POA: Diagnosis not present

## 2015-07-26 DIAGNOSIS — I679 Cerebrovascular disease, unspecified: Secondary | ICD-10-CM

## 2015-07-26 DIAGNOSIS — E785 Hyperlipidemia, unspecified: Secondary | ICD-10-CM | POA: Insufficient documentation

## 2015-07-26 DIAGNOSIS — I6523 Occlusion and stenosis of bilateral carotid arteries: Secondary | ICD-10-CM | POA: Insufficient documentation

## 2015-07-28 ENCOUNTER — Encounter: Payer: Self-pay | Admitting: Cardiology

## 2015-07-28 ENCOUNTER — Ambulatory Visit (INDEPENDENT_AMBULATORY_CARE_PROVIDER_SITE_OTHER): Payer: Medicare Other | Admitting: Cardiology

## 2015-07-28 ENCOUNTER — Encounter (HOSPITAL_COMMUNITY): Payer: Medicare Other | Admitting: Internal Medicine

## 2015-07-28 VITALS — BP 164/78 | HR 60 | Ht 75.0 in | Wt 237.1 lb

## 2015-07-28 DIAGNOSIS — E785 Hyperlipidemia, unspecified: Secondary | ICD-10-CM

## 2015-07-28 DIAGNOSIS — I2510 Atherosclerotic heart disease of native coronary artery without angina pectoris: Secondary | ICD-10-CM

## 2015-07-28 DIAGNOSIS — I4891 Unspecified atrial fibrillation: Secondary | ICD-10-CM | POA: Diagnosis not present

## 2015-07-28 DIAGNOSIS — I2583 Coronary atherosclerosis due to lipid rich plaque: Secondary | ICD-10-CM

## 2015-07-28 DIAGNOSIS — I679 Cerebrovascular disease, unspecified: Secondary | ICD-10-CM

## 2015-07-28 DIAGNOSIS — I10 Essential (primary) hypertension: Secondary | ICD-10-CM | POA: Diagnosis not present

## 2015-07-28 NOTE — Patient Instructions (Signed)
Your physician wants you to follow-up in: 6 MONTHS WITH DR CRENSHAW You will receive a reminder letter in the mail two months in advance. If you don't receive a letter, please call our office to schedule the follow-up appointment.   If you need a refill on your cardiac medications before your next appointment, please call your pharmacy.  

## 2015-07-28 NOTE — Assessment & Plan Note (Signed)
Follow-up carotid Dopplers April 2018. 

## 2015-07-28 NOTE — Assessment & Plan Note (Signed)
Continue statin.No aspirin given need for anticoagulation. 

## 2015-07-28 NOTE — Assessment & Plan Note (Signed)
Patient with history of paroxysmal atrial fibrillation and now paroxysmal atrial tachycardia. He remains in sinus rhythm status post cardioversion. Continue amiodarone and apixaban. He has had recent TSH, liver functions and chest x-ray.

## 2015-07-28 NOTE — Assessment & Plan Note (Signed)
Continue statin. 

## 2015-07-28 NOTE — Assessment & Plan Note (Signed)
Status post repair

## 2015-07-28 NOTE — Progress Notes (Signed)
HPI: FU atrial fibrillation, HTN, HL and ascending Ao aneurysm s/p repair. Previously followed by Dr Haroldine Laws and followed by Dr Rayann Heman for atrial fibrillation. Status post ascending aorta repair.   He was diagnosed with atrial fibrillation in 1977. He failed medical therapy with flecainide, rhythmol and multaq. Not tried on TIkosyn or sotalol due to prolonged QT. He underwent atrial flutter ablation in [place] late 1990s. Patient has had 3 previous atrial fibrillation ablations. In February 2016 the patient had aortic root replacement, coronary artery bypass graft 1, pulmonary vein isolation and left atrial appendage ligation at [facility] clinic. Chest CT March 2016 showed status post aortic root replacement and significant axillary, mediastinal and hilar adenopathy. Echocardiogram October 2016 showed normal LV systolic function, grade 3 diastolic dysfunction, mild aortic insufficiency, moderately dilated ascending aorta, severe left atrial enlargement and right atrial enlargement and mild pulmonic insufficiency. Carotid Dopplers April 2017 showed 40-59% right and 1-39% left stenosis. Follow-up recommended 1 year.  Patient had cardioversion of ectopic atrial rhythm on April 13. Since last seen, the patient denies any dyspnea on exertion, orthopnea, PND, pedal edema, palpitations, syncope or chest pain.   Current Outpatient Prescriptions  Medication Sig Dispense Refill  . amiodarone (PACERONE) 200 MG tablet Take 1 tablet (200 mg total) by mouth 2 (two) times daily. Reduce to 200 mg daily starting 07/15/15 as per EP instructions. 60 tablet 3  . amLODipine (NORVASC) 5 MG tablet Take 1 tablet by mouth daily.    Marland Kitchen ELIQUIS 5 MG TABS tablet TAKE 1 TABLET BY MOUTH TWICE A DAY 180 tablet 3  . LORazepam (ATIVAN) 0.5 MG tablet Take 1 tablet by mouth 2 (two) times daily.    Marland Kitchen losartan (COZAAR) 100 MG tablet TAKE 1 TABLET BY MOUTH ONCE DAILY 90 tablet 0  . lovastatin (MEVACOR) 20 MG tablet TAKE ONE  TABLET BY MOUTH WITH EVENING MEAL 90 tablet 1  . pantoprazole (PROTONIX) 20 MG tablet TAKE ONE TABLET BY MOUTH EVERY DAY 30 tablet 3  . Probiotic Product (FLORAJEN3 PO) Take 2 capsules by mouth daily.    Marland Kitchen zolpidem (AMBIEN CR) 12.5 MG CR tablet Take 1 tablet by mouth at bedtime as needed. Sleep     No current facility-administered medications for this visit.     Past Medical History  Diagnosis Date  . Anticoagulants causing adverse effect in therapeutic use   . Persistent atrial fibrillation (HCC)     s/p ablations x3  . Hyperthyroidism     resolved per pt  . Testicular failure   . Aortic aneurysm [facility])     s/p surgery at [facility] [place] [facility]  . HTN (hypertension)   . Hyperlipidemia   . 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 Laminectomy      x2  . Appendectomy    . Total knee arthroplasty      bilateral  . Atrial ablation surgery  1. late 1990's  2. 02/01/06  3. 10/10    1. In [place]  2. At [facility]  3. Redo by Greggory Brandy [facility])  . Stress cardiolite  01/29/06  . Transthoracic echocardiogram  02/08/06  . Cardioversion N/A 09/16/2012    Procedure: CARDIOVERSION;  Surgeon: Jolaine Artist, MD;  Location: [facility] ENDOSCOPY;  Service: Cardiovascular;  Laterality: N/A;  angie will call back /heather [phone]  . Tonsillectomy    . Back surgery    . Thoracic aortic aneurysm repair  2/16  anomalous RCA also repaired at [facility]  . Cardioversion N/A 07/15/2015    Procedure: CARDIOVERSION;  Surgeon: Herminio Commons, MD;  Location: [place];  Service: Cardiovascular;  Laterality: N/A;    Social History   Social History  . Marital Status: Married    Spouse Name: N/A  . Number of Children: N/A  . Years of Education: N/A   Occupational History  . 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 Smoking status: Never Smoker   . Smokeless tobacco: Not on file  . Alcohol Use: Not on file  . Drug Use: Not on file  . Sexual  Activity: Not on file   Other Topics Concern  . Not on file   Social History Narrative   Lives in [place].    Family History  Problem Relation Age of Onset  . Arrhythmia Mother     ROS: no fevers or chills, productive cough, hemoptysis, dysphasia, odynophagia, melena, hematochezia, dysuria, hematuria, rash, seizure activity, orthopnea, PND, pedal edema, claudication. Remaining systems are negative.  Physical Exam: Well-developed well-nourished in no acute distress.  Skin is warm and dry.  HEENT is normal.  Neck is supple.  Chest is clear to auscultation with normal expansion.  Cardiovascular exam is regular rate and rhythm. 1/6 diastolic murmur left sternal border. Abdominal exam nontender or distended. No masses palpated. Extremities show no edema. neuro grossly intact  ECG Sinus rhythm at a rate of 58. Right bundle branch block.

## 2015-07-28 NOTE — Assessment & Plan Note (Signed)
Blood pressure is mildly elevated. However he follows this at home and it is typically control. Continue present medications.

## 2015-07-30 ENCOUNTER — Ambulatory Visit (INDEPENDENT_AMBULATORY_CARE_PROVIDER_SITE_OTHER): Payer: Medicare Other | Admitting: Internal Medicine

## 2015-07-30 ENCOUNTER — Encounter: Payer: Self-pay | Admitting: Internal Medicine

## 2015-07-30 VITALS — BP 164/82 | HR 59 | Ht 75.0 in | Wt 240.6 lb

## 2015-07-30 DIAGNOSIS — I481 Persistent atrial fibrillation: Secondary | ICD-10-CM

## 2015-07-30 DIAGNOSIS — I2510 Atherosclerotic heart disease of native coronary artery without angina pectoris: Secondary | ICD-10-CM

## 2015-07-30 DIAGNOSIS — I480 Paroxysmal atrial fibrillation: Secondary | ICD-10-CM

## 2015-07-30 DIAGNOSIS — I119 Hypertensive heart disease without heart failure: Secondary | ICD-10-CM | POA: Insufficient documentation

## 2015-07-30 DIAGNOSIS — Z0000 Encounter for general adult medical examination without abnormal findings: Secondary | ICD-10-CM

## 2015-07-30 DIAGNOSIS — I4819 Other persistent atrial fibrillation: Secondary | ICD-10-CM

## 2015-07-30 MED ORDER — AMIODARONE HCL 200 MG PO TABS: 200.0000 mg | ORAL_TABLET | Freq: Every day | ORAL | Status: DC

## 2015-07-30 MED ORDER — AMLODIPINE BESYLATE 10 MG PO TABS: 10.0000 mg | ORAL_TABLET | Freq: Every day | ORAL | Status: DC

## 2015-07-30 MED ORDER — AMLODIPINE BESYLATE 10 MG PO TABS: 5.0000 mg | ORAL_TABLET | Freq: Every day | ORAL | Status: DC

## 2015-07-30 NOTE — Patient Instructions (Signed)
Medication Instructions:  Your physician has recommended you make the following change in your medication:  1) Increase Amlodipine to 10 mg daily   Labwork: None ordered   Testing/Procedures: None ordered   Follow-Up: Your physician recommends that you schedule a follow-up appointment in: 3 months with Roderic Palau, NP   Any Other Special Instructions Will Be Listed Below (If Applicable).     If you need a refill on your cardiac medications before your next appointment, please call your pharmacy.

## 2015-07-30 NOTE — Progress Notes (Signed)
Primary Cardiologist:  [name]  The patient presents today to follow-up with EP.  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  He also enjoys reading.  He recently had atach for which he was cardioverted.  He is doing better currently.  He says that he is taking amiodarone 200mg  daily currently. Currently, he denies CP, SOB, palpitations, dizziness, presyncope or syncope.  Past Medical History  Diagnosis Date  . Anticoagulants causing adverse effect in therapeutic use   . Persistent atrial fibrillation (HCC)     s/p ablations x3  . Hyperthyroidism     resolved per pt  . Testicular failure   . Aortic aneurysm [facility])     s/p surgery at [facility]  . HTN (hypertension)   . Hyperlipidemia   . 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 Laminectomy      x2  . Appendectomy    . Total knee arthroplasty      bilateral  . Atrial ablation surgery  1. late 1990's  2. 02/01/06  3. 10/10    1. In [place]  2. At [facility]  3. Redo by Greggory Brandy [facility])  . Stress cardiolite  01/29/06  . Transthoracic echocardiogram  02/08/06  . Cardioversion N/A 09/16/2012    Procedure: CARDIOVERSION;  Surgeon: Jolaine Artist, MD;  Location: [facility] ENDOSCOPY;  Service: Cardiovascular;  Laterality: N/A;  angie will call back /heather [phone]  . Tonsillectomy    . Back surgery    . Thoracic aortic aneurysm repair  2/16    anomalous RCA also repaired at [facility]  . Cardioversion N/A 07/15/2015    Procedure: CARDIOVERSION;  Surgeon: Herminio Commons, MD;  Location: [place];  Service: Cardiovascular;  Laterality: N/A;    Current Outpatient Prescriptions  Medication Sig Dispense Refill  . amiodarone (PACERONE) 200 MG tablet Take 1 tablet (200 mg total) by mouth 2 (two) times daily. Reduce to 200 mg daily starting 07/15/15 as per EP instructions. 60 tablet 3  . amLODipine (NORVASC) 5 MG tablet Take 1 tablet by mouth daily.    Marland Kitchen ELIQUIS 5 MG TABS tablet TAKE 1 TABLET BY  MOUTH TWICE A DAY 180 tablet 3  . LORazepam (ATIVAN) 0.5 MG tablet Take 1 tablet by mouth 2 (two) times daily.    Marland Kitchen losartan (COZAAR) 100 MG tablet TAKE 1 TABLET BY MOUTH ONCE DAILY 90 tablet 0  . lovastatin (MEVACOR) 20 MG tablet TAKE ONE TABLET BY MOUTH WITH EVENING MEAL 90 tablet 1  . pantoprazole (PROTONIX) 20 MG tablet TAKE ONE TABLET BY MOUTH EVERY DAY 30 tablet 3  . Probiotic Product (FLORAJEN3 PO) Take 2 capsules by mouth daily.    Marland Kitchen zolpidem (AMBIEN CR) 12.5 MG CR tablet Take 1 tablet by mouth at bedtime as needed. Sleep     No current facility-administered medications for this visit.    No Known Allergies  Social History   Social History  . Marital Status: Married    Spouse Name: N/A  . Number of Children: N/A  . Years of Education: N/A   Occupational History  . 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 Smoking status: Never Smoker   . Smokeless tobacco: Not on file  . Alcohol Use: Not on file  . Drug Use: Not on file  . Sexual Activity: Not on file   Other Topics Concern  . Not  on file   Social History Narrative   Lives in [place].    Family History  Problem Relation Age of Onset  . Arrhythmia Mother     Physical Exam: Filed Vitals:   07/30/15 0854  BP: 164/82  Pulse: 59  Height: 6\' 3"  (1.905 m)  Weight: 240 lb 9.6 oz (109.135 kg)    GEN- The patient is well appearing, alert and oriented x 3 today.   Head- normocephalic, atraumatic Eyes-  Sclera clear, conjunctiva pink Ears- hearing intact Oropharynx- clear Neck- supple, no JVP Lymph- no cervical lymphadenopathy Lungs- Clear to ausculation bilaterally, normal work of breathing Heart- Regular rate and rhythm, no murmurs, rubs or gallops, PMI not laterally displaced GI- soft, NT, ND, + BS Extremities- no clubbing, cyanosis, or edema  ekg today reveals sinus rhythm 59 bpm, PR 200 msec, RBBB  Assessment and Plan:  1. Persistent afib/ atypical atrial flutter/  atach He has had multiple prior ablation procedures as well as MAZE in 2016.  He continues to struggle with atrial arrhythmias but honestly is doing quite well given his severe LA enlargement.  I suspect that eventually he will end up with permanent afib. Chads2vasc score is at least 3.  He is anticoagulated with eliquis. Prevention efforts discussed today  2. Hypertensive cardiovascular disease Elevated BP Increase noravasc to 10mg  daily today   Follow-up with [name] in the AF clinic every 3 months.  I will see when needed. Follow-up with Dr [name] as scheduled  Thompson Grayer MD, [facility] 07/30/2015 9:18 AM

## 2015-07-30 NOTE — Addendum Note (Signed)
Addended by: Janan Halter F on: 07/30/2015 10:07 AM   Modules accepted: Orders

## 2015-08-03 ENCOUNTER — Encounter: Payer: Self-pay | Admitting: Cardiology

## 2015-08-12 ENCOUNTER — Other Ambulatory Visit: Payer: Self-pay | Admitting: Cardiology

## 2015-08-12 NOTE — Telephone Encounter (Signed)
Rx request sent to pharmacy.  

## 2015-11-15 ENCOUNTER — Other Ambulatory Visit: Payer: Self-pay | Admitting: Cardiology

## 2015-11-15 NOTE — Telephone Encounter (Signed)
Rx(s) sent to pharmacy electronically.  

## 2015-12-18 ENCOUNTER — Other Ambulatory Visit: Payer: Self-pay | Admitting: Cardiology

## 2015-12-21 ENCOUNTER — Encounter: Payer: Self-pay | Admitting: Cardiovascular Disease

## 2015-12-21 ENCOUNTER — Ambulatory Visit (INDEPENDENT_AMBULATORY_CARE_PROVIDER_SITE_OTHER): Payer: Medicare Other | Admitting: Cardiovascular Disease

## 2015-12-21 VITALS — BP 142/80 | HR 98 | Ht 74.5 in | Wt 234.0 lb

## 2015-12-21 DIAGNOSIS — I498 Other specified cardiac arrhythmias: Secondary | ICD-10-CM

## 2015-12-21 DIAGNOSIS — I480 Paroxysmal atrial fibrillation: Secondary | ICD-10-CM | POA: Diagnosis not present

## 2015-12-21 DIAGNOSIS — I25709 Atherosclerosis of coronary artery bypass graft(s), unspecified, with unspecified angina pectoris: Secondary | ICD-10-CM | POA: Diagnosis not present

## 2015-12-21 DIAGNOSIS — I209 Angina pectoris, unspecified: Secondary | ICD-10-CM

## 2015-12-21 DIAGNOSIS — I6523 Occlusion and stenosis of bilateral carotid arteries: Secondary | ICD-10-CM | POA: Diagnosis not present

## 2015-12-21 DIAGNOSIS — I491 Atrial premature depolarization: Secondary | ICD-10-CM

## 2015-12-21 DIAGNOSIS — E785 Hyperlipidemia, unspecified: Secondary | ICD-10-CM | POA: Diagnosis not present

## 2015-12-21 DIAGNOSIS — Z8679 Personal history of other diseases of the circulatory system: Secondary | ICD-10-CM

## 2015-12-21 DIAGNOSIS — I10 Essential (primary) hypertension: Secondary | ICD-10-CM

## 2015-12-21 DIAGNOSIS — Z9889 Other specified postprocedural states: Secondary | ICD-10-CM

## 2015-12-21 MED ORDER — AMLODIPINE BESYLATE 10 MG PO TABS: 10.0000 mg | ORAL_TABLET | Freq: Every day | ORAL | 3 refills | Status: DC

## 2015-12-21 MED ORDER — LOVASTATIN 20 MG PO TABS: ORAL_TABLET | ORAL | 3 refills | Status: DC

## 2015-12-21 MED ORDER — METOPROLOL TARTRATE 25 MG PO TABS: 25.0000 mg | ORAL_TABLET | Freq: Two times a day (BID) | ORAL | 3 refills | Status: DC

## 2015-12-21 MED ORDER — AMIODARONE HCL 200 MG PO TABS: 200.0000 mg | ORAL_TABLET | Freq: Every day | ORAL | 3 refills | Status: DC

## 2015-12-21 MED ORDER — LOSARTAN POTASSIUM 100 MG PO TABS: 100.0000 mg | ORAL_TABLET | Freq: Every day | ORAL | 3 refills | Status: DC

## 2015-12-21 MED ORDER — APIXABAN 5 MG PO TABS: ORAL_TABLET | ORAL | 3 refills | Status: DC

## 2015-12-21 NOTE — Addendum Note (Signed)
Addended by: Barbarann Ehlers A on: 12/21/2015 10:40 AM   Modules accepted: Orders

## 2015-12-21 NOTE — Progress Notes (Addendum)
SUBJECTIVE: Antonio Carrillo is a 75 year old male who I am evaluating for the first time. He used to see Dr. [name]. Has a history of atrial fibrillation, hypertension, CABG, hyperlipidemia, ascending aortic aneurysm repair, as well as atrial fibrillation.  Lives in [place].  He was diagnosed with atrial fibrillation in 1977. He failed medical therapy with flecainide, rhythmol and multaq. Not tried on TIkosyn or sotalol due to prolonged QT. He underwent atrial flutter ablation in [place] late 1990s. Patient has had 3 previous atrial fibrillation ablations. In February 2016 the patient had aortic root replacement, coronary artery bypass graft 1, pulmonary vein isolation and left atrial appendage ligation at [facility] clinic. Chest CT March 2016 showed status post aortic root replacement and significant axillary, mediastinal and hilar adenopathy. Echocardiogram October 2016 showed normal LV systolic function, grade 3 diastolic dysfunction, mild aortic insufficiency, moderately dilated ascending aorta, severe left atrial enlargement and right atrial enlargement and mild pulmonic insufficiency. Carotid Dopplers April 2017 showed 40-59% right and 1-39% left stenosis.   He denies chest pain and shortness of breath. 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 He exercises at the gym 4 days per week and uses the bicycle. 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 He has difficulty sleeping. He occasionally has feet swelling.  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 He is originally from [place]. He is proficient in piano and takes lessons. As a child he studied at [employer]. 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   Review of Systems: As per "subjective", otherwise negative.  No Known  Allergies  Current Outpatient Prescriptions  Medication Sig Dispense Refill  . amiodarone (PACERONE) 200 MG tablet Take 1 tablet (200 mg total) by mouth daily. Reduce to 200 mg daily starting 07/15/15 as per EP instructions. 90 tablet 3  . amLODipine (NORVASC) 10 MG tablet Take 1 tablet (10 mg total) by mouth daily. 90 tablet 3  . ELIQUIS 5 MG TABS tablet TAKE ONE TABLET BY MOUTH 2 TIMES A DAY 60 tablet 5  . LORazepam (ATIVAN) 0.5 MG tablet Take 1 tablet by mouth 2 (two) times daily.    Marland Kitchen losartan (COZAAR) 100 MG tablet TAKE ONE TABLET BY MOUTH EVERY DAY 30 tablet 10  . lovastatin (MEVACOR) 20 MG tablet TAKE ONE TABLET BY MOUTH WITH EVENING MEAL 90 tablet 1  . pantoprazole (PROTONIX) 20 MG tablet TAKE ONE TABLET BY MOUTH EVERY DAY 30 tablet 0  . Probiotic Product (FLORAJEN3 PO) Take 2 capsules by mouth daily.    Marland Kitchen zolpidem (AMBIEN CR) 12.5 MG CR tablet Take 1 tablet by mouth at bedtime as needed. Sleep     No current facility-administered medications for this visit.     Past Medical History:  Diagnosis Date  . Anticoagulants causing adverse effect in therapeutic use   . Aortic aneurysm [facility])    s/p surgery at [facility]  . HTN (hypertension)   . Hyperlipidemia   . Hyperthyroidism    resolved per pt  . 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 Persistent atrial fibrillation (HCC)    s/p ablations x3  . Testicular failure     Past Surgical History:  Procedure Laterality Date  . APPENDECTOMY    . ATRIAL ABLATION SURGERY  1. late 1990's  2. 02/01/06  3. 10/10   1. In [place]  2. At [facility]  3. Redo by Greggory Brandy [facility])  . BACK SURGERY    . CARDIOVERSION N/A 09/16/2012   Procedure: CARDIOVERSION;  Surgeon: Jolaine Artist, MD;  Location: [facility] ENDOSCOPY;  Service: Cardiovascular;  Laterality: N/A;  angie will call back /heather [phone]  . CARDIOVERSION N/A 07/15/2015   Procedure: CARDIOVERSION;  Surgeon: Herminio Commons, MD;  Location: [place];  Service: Cardiovascular;  Laterality: N/A;    . LAMINECTOMY     x2  . Stress cardiolite  01/29/06  . 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 TONSILLECTOMY    . TOTAL KNEE ARTHROPLASTY     bilateral  . TRANSTHORACIC ECHOCARDIOGRAM  02/08/06    Social History   Social History  . Marital status: Married    Spouse name: N/A  . Number of children: N/A  . Years of education: N/A   Occupational History  . 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 Smoking status: Never Smoker  . Smokeless tobacco: Never Used  . Alcohol use No  . Drug use: No  . Sexual activity: Yes    Partners: Female   Other Topics Concern  . Not on file   Social History Narrative   Lives in [place].     Vitals:   12/21/15 0907  BP: (!) 142/80  Pulse: 98  SpO2: 96%  Weight: 234 lb (106.1 kg)  Height: 6' 2.5" (1.892 m)    PHYSICAL EXAM General: NAD HEENT: Normal. Neck: No JVD, no thyromegaly. Lungs: Clear to auscultation bilaterally with normal respiratory effort. CV: Nondisplaced PMI.  Regular rate and irregular rhythm, normal S1/S2, no S3, 1/4 holodiastolic murmur along LSB. Trivial pretibial and periankle edema.  No carotid bruit.   Abdomen: Soft, nontender, no distention.  Neurologic: Alert and oriented.  Psych: Normal affect. Skin: Normal. Musculoskeletal: No gross deformities.    ECG: Most recent ECG reviewed.      ASSESSMENT AND PLAN: 1. CAD with CABG: Stable. No changes. Continue lovastatin. Will obtain CBC and BMET.  2. Hyperlipidemia: Continue statin. Will obtain lipids.  3. HTN: Mildly elevated. Will monitor.  4. Carotid artery stenosis: Repeat Dopplers in 07/2016.  5. PAF/PAT: Continue amiodarone and apixaban. Follows with EP.  6. Aortic aneurysm repair: Stable.  Dispo: fu 6 months.   Time spent: 40 minutes, of which greater than 50% was spent reviewing symptoms, relevant blood tests and studies, and discussing management plan with the  patient.   Kate Sable, M.D., F.A.C.C.  ADDENDUM: ECG showed atrial fibrillation, HR 99 bpm. Will start metoprolol 25 mg BID and have him see Doristine Devoid in a fib clinic.

## 2015-12-21 NOTE — Patient Instructions (Addendum)
Your physician wants you to follow-up in: 6 months Dr Christain Sacramento will receive a reminder letter in the mail two months in advance. If you don't receive a letter, please call our office to schedule the follow-up appointment.   Get FASTING Lab work    Your physician has requested that you have a carotid duplex in April 2018. This test is an ultrasound of the carotid arteries in your neck. It looks at blood flow through these arteries that supply the brain with blood. Allow one hour for this exam. There are no restrictions or special instructions.  START Metoprolol 25 mg twice a day  Dr Wolfgang Phoenix [phone]   Your physician recommends that you continue on your current medications as directed. Please refer to the Current Medication list given to you today.      Thank you for choosing [facility] !

## 2015-12-21 NOTE — Addendum Note (Signed)
Addended by: Levonne Hubert on: 12/21/2015 10:32 AM   Modules accepted: Orders

## 2015-12-21 NOTE — Addendum Note (Signed)
Addended by: Debbora Lacrosse R on: 12/21/2015 10:27 AM   Modules accepted: Orders

## 2015-12-22 ENCOUNTER — Ambulatory Visit (HOSPITAL_COMMUNITY)
Admission: RE | Admit: 2015-12-22 | Discharge: 2015-12-22 | Disposition: A | Discharge status: Home / Self Care | Payer: Medicare Other | Source: Ambulatory Visit | Attending: Nurse Practitioner | Admitting: Nurse Practitioner

## 2015-12-22 ENCOUNTER — Encounter (HOSPITAL_COMMUNITY): Payer: Self-pay | Admitting: Nurse Practitioner

## 2015-12-22 VITALS — BP 118/80 | HR 75 | Ht 72.25 in | Wt 236.6 lb

## 2015-12-22 DIAGNOSIS — I481 Persistent atrial fibrillation: Secondary | ICD-10-CM | POA: Diagnosis present

## 2015-12-22 DIAGNOSIS — I10 Essential (primary) hypertension: Secondary | ICD-10-CM | POA: Diagnosis not present

## 2015-12-22 DIAGNOSIS — Z79899 Other long term (current) drug therapy: Secondary | ICD-10-CM | POA: Insufficient documentation

## 2015-12-22 DIAGNOSIS — I480 Paroxysmal atrial fibrillation: Secondary | ICD-10-CM | POA: Diagnosis not present

## 2015-12-22 DIAGNOSIS — Z7901 Long term (current) use of anticoagulants: Secondary | ICD-10-CM | POA: Insufficient documentation

## 2015-12-22 LAB — BASIC METABOLIC PANEL
ANION GAP: 6 (ref 5–15)
BUN: 27 mg/dL — AB (ref 6–20)
CHLORIDE: 108 mmol/L (ref 101–111)
CO2: 26 mmol/L (ref 22–32)
Calcium: 9.3 mg/dL (ref 8.9–10.3)
Creatinine, Ser: 0.77 mg/dL (ref 0.61–1.24)
Glucose, Bld: 122 mg/dL — ABNORMAL HIGH (ref 65–99)
POTASSIUM: 4.3 mmol/L (ref 3.5–5.1)
SODIUM: 140 mmol/L (ref 135–145)

## 2015-12-22 LAB — CBC
HCT: 39.8 % (ref 39.0–52.0)
HEMOGLOBIN: 12.6 g/dL — AB (ref 13.0–17.0)
MCH: 28.6 pg (ref 26.0–34.0)
MCHC: 31.7 g/dL (ref 30.0–36.0)
MCV: 90.2 fL (ref 78.0–100.0)
PLATELETS: 111 10*3/uL — AB (ref 150–400)
RBC: 4.41 MIL/uL (ref 4.22–5.81)
RDW: 15.9 % — ABNORMAL HIGH (ref 11.5–15.5)
WBC: 11.2 10*3/uL — AB (ref 4.0–10.5)

## 2015-12-22 NOTE — Progress Notes (Signed)
Patient ID: Antonio Carrillo, male   DOB: June 07, 1940, 75 y.o.   MRN: [id number]     Primary Care Physician: No PCP Per Patient Referring Physician: Dr. Rayann Heman Cardiologist: Dr. Joesphine Bare is a 75 y.o. male with a h/o atrial fibrillation, HTN, HL and ascending Aortic aneurysm s/p repair. Previously followed by Dr Haroldine Laws and followed by Dr Rayann Heman for atrial fibrillation. Carotid Dopplers at [facility] clinic February 2016 showed 20-39% bilateral stenosis. Echocardiogram March 2016 showed normal LV function. There was biatrial enlargement. Moderate pericardial effusion. Status post ascending aorta repair.   He was diagnosed with atrial fibrillation in 1977. He failed medical therapy with flecainide, rhythmol and multaq. Not tried on TIkosyn or sotalol due to prolonged QT. He underwent atrial flutter ablation in [place] late 1990s. Patient has had 3 previous atrial fibrillation ablations. In February 2016 the patient had aortic root replacement, one artery bypass graft 1, pulmonary vein isolation and left atrial appendage ligation at [facility] clinic.  He is in the afib clinic 12/12, today to get established. He reports no episodes of afib since  procedures 2/16. Overall, he is feeling well. No issues with eliquis for which he is compliant. He recently established with Dr. [name] and monitoring labs for amiodarone were ordered but never done,  Echo done and chest xray ordered but not performed.  Returns to afib clinic/ 4/7, to f/u on ectopic atrial rhythm from last week. Amiodarone was increased to 200 mg bid, but with no improvement. Dr. Rayann Heman reviewed EKG and feels cardioversion would be reasonable to try to restore SR. Pt does not feel as energetic in this rhythm. HR usually runs around 50-60. Pt has had multiple cardioversion's in the past.  Returns to afib clinic and was found to be in afib, CVR, at his recent visit with Dr.Koneswaran. Metoprolol 25 mg bid was added. He is clinic  today with afib, rate controlled in the 70's. Not sure how ling he has been in afib bu has felt off for several days. He would like to be cardioverted. No missed doses of DOAC.  Pt had successful cardiovrsion 4/13. He has had multiple ablations and no other antiarrythmics are felt to be suitable in his situation. Discussed with pt  at some point, he may have to live in afib. He said he was told this before by his MD in [place]. No triggers for recent episode.  Today, he denies symptoms of palpitations, chest pain,  orthopnea, PND, lower extremity edema, dizziness, presyncope, syncope, or neurologic sequela. MiIld fatigue and shortness of breath. The patient is tolerating medications without difficulties and is otherwise without complaint today.   Past Medical History:  Diagnosis Date  . Anticoagulants causing adverse effect in therapeutic use   . Aortic aneurysm [facility])    s/p surgery at [facility]  . HTN (hypertension)   . Hyperlipidemia   . Hyperthyroidism    resolved per pt  . 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 Persistent atrial fibrillation (HCC)    s/p ablations x3  . Testicular failure    Past Surgical History:  Procedure Laterality Date  . APPENDECTOMY    . ATRIAL ABLATION SURGERY  1. late 1990's  2. 02/01/06  3. 10/10   1. In [place]  2. At [facility]  3. Redo by Greggory Brandy [facility])  . BACK SURGERY    . CARDIOVERSION N/A 09/16/2012   Procedure: CARDIOVERSION;  Surgeon: Jolaine Artist, MD;  Location: [place];  Service: Cardiovascular;  Laterality: N/A;  angie will call back /heather [phone]  . CARDIOVERSION N/A 07/15/2015   Procedure: CARDIOVERSION;  Surgeon: Herminio Commons, MD;  Location: [place];  Service: Cardiovascular;  Laterality: N/A;  . LAMINECTOMY     x2  . Stress cardiolite  01/29/06  . 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 TONSILLECTOMY    . TOTAL KNEE ARTHROPLASTY     bilateral  . TRANSTHORACIC ECHOCARDIOGRAM  02/08/06     Current Outpatient Prescriptions  Medication Sig Dispense Refill  . amiodarone (PACERONE) 200 MG tablet Take 1 tablet (200 mg total) by mouth daily. Reduce to 200 mg daily starting 07/15/15 as per EP instructions. 90 tablet 3  . amLODipine (NORVASC) 10 MG tablet Take 1 tablet (10 mg total) by mouth daily. 90 tablet 3  . apixaban (ELIQUIS) 5 MG TABS tablet TAKE ONE TABLET BY MOUTH 2 TIMES A DAY 180 tablet 3  . LORazepam (ATIVAN) 0.5 MG tablet Take 1 tablet by mouth 2 (two) times daily.    Marland Kitchen losartan (COZAAR) 100 MG tablet Take 1 tablet (100 mg total) by mouth daily. 90 tablet 3  . lovastatin (MEVACOR) 20 MG tablet TAKE ONE TABLET BY MOUTH WITH EVENING MEAL 90 tablet 3  . metoprolol tartrate (LOPRESSOR) 25 MG tablet Take 1 tablet (25 mg total) by mouth 2 (two) times daily. 180 tablet 3  . pantoprazole (PROTONIX) 20 MG tablet TAKE ONE TABLET BY MOUTH EVERY DAY 30 tablet 0  . Probiotic Product (FLORAJEN3 PO) Take 2 capsules by mouth daily.    Marland Kitchen zolpidem (AMBIEN CR) 12.5 MG CR tablet Take 1 tablet by mouth at bedtime as needed. Sleep     No current facility-administered medications for this encounter.     No Known Allergies  Social History   Social History  . Marital status: Married    Spouse name: N/A  . Number of children: N/A  . Years of education: N/A   Occupational History  . 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 Smoking status: Never Smoker  . Smokeless tobacco: Never Used  . Alcohol use No  . Drug use: No  . Sexual activity: Yes    Partners: Female   Other Topics Concern  . Not on file   Social History Narrative   Lives in [place].    Family History  Problem Relation Age of Onset  . Arrhythmia Mother     ROS- All systems are reviewed and negative except as per the HPI above  Physical Exam: Vitals:   12/22/15 0943  BP: 118/80  Pulse: 75  Weight: 236 lb 9.6 oz (107.3 kg)  Height: 6' 0.25" (1.835 m)    GEN- The  patient is well appearing, alert and oriented x 3 today.   Head- normocephalic, atraumatic Eyes-  Sclera clear, conjunctiva pink Ears- hearing intact Oropharynx- clear Neck- supple, no JVP Lymph- no cervical lymphadenopathy Lungs- Clear to ausculation bilaterally, normal work of breathing Heart- irregular rate and rhythm, no murmurs, rubs or gallops, PMI not laterally displaced GI- soft, NT, ND, + BS Extremities- no clubbing, cyanosis, or edema MS- no significant deformity or atrophy Skin- no rash or lesion Psych- euthymic mood, full affect Neuro- strength and sensation are intact  EKG-  afib at 75 bpm, RBBB, QRS int 180 ms, Qtc 551 ms   Echo-Left ventricle: The cavity size was mildly dilated. Systolic function was normal.  The estimated ejection fraction was in the range of 60% to 65%. Wall motion was normal; there were no regional wall motion abnormalities. Doppler parameters are consistent with a reversible restrictive pattern, indicative of decreased left ventricular diastolic compliance and/or increased left atrial pressure (grade 3 diastolic dysfunction). Doppler parameters are consistent with elevated ventricular end-diastolic filling pressure. - Aortic valve: There was mild regurgitation. Regurgitation pressure half-time: 680 ms. - Aorta: Ascending aortic diameter: 41 mm (S). - Aortic root: The aortic root was dilated. - Ascending aorta: The ascending aorta was moderately dilated. - Mitral valve: Transvalvular velocity was within the normal range. There was no evidence for stenosis. There was trivial regurgitation. - Left atrium: The atrium was severely dilated. - Right ventricle: The cavity size was normal. Wall thickness was normal. Systolic function was normal. - Right atrium: The atrium was severely dilated. - Atrial septum: No defect or patent foramen ovale was identified. - Pulmonic valve: There was mild regurgitation. - Inferior vena  cava: The vessel was normal in size. The respirophasic diameter changes were in the normal range (>= 50%), consistent with normal central venous pressure.   Assessment and Plan: 1. Symptomatic atrial fib Scheduled for cardioversion 9/22 Has not missed any doses of eliquis Increase amiodarone to 200 mg bid, but reduce to 200 mg qd after cardioversion bmet,cbc today    F/u here s/p cardioversion   Butch Penny C. Shanel Prazak, [facility] [address] [place],  [address] [phone]

## 2015-12-22 NOTE — Patient Instructions (Addendum)
Cardioversion scheduled for Friday, September 22nd  - Arrive at the [facility] and go to admitting at [facility] not eat or drink anything after midnight the night prior to your procedure.  - Take all your medication with a sip of water prior to arrival.  - You will not be able to drive home after your procedure.  Your physician has recommended you make the following change in your medication:  1)Increase Amiodarone 200mg  twice a day -- return to once a day after cardioversion 2)Stop metoprolol on Friday   Scheduler will be in touch with you to establish with Dr. Marlou Porch

## 2015-12-23 NOTE — Anesthesia Preprocedure Evaluation (Addendum)
Anesthesia Evaluation  Patient identified by MRN, date of birth, ID band Patient awake    Reviewed: Allergy & Precautions, NPO status , Patient's Chart, lab work & pertinent test results, reviewed documented beta blocker date and time   History of Anesthesia Complications Negative for: history of anesthetic complications  Airway Mallampati: III  TM Distance: >3 FB Neck ROM: Full    Dental  (+) Teeth Intact, Dental Advisory Given   Pulmonary neg pulmonary ROS, neg shortness of breath, neg sleep apnea, neg COPD, neg recent URI,    Pulmonary exam normal breath sounds clear to auscultation       Cardiovascular hypertension, Pt. on medications and Pt. on home beta blockers (-) angina+ CAD, + CABG (05/2014) and + Peripheral Vascular Disease  (-) Past MI and (-) Cardiac Stents + dysrhythmias Atrial Fibrillation  Rhythm:Irregular Rate:Normal  HLD  Carotid Dopplers at [facility] clinic February 2016 showed 20-39% bilateral stenosis.   In February 2016 the patient had aortic root replacement, coronary artery bypass graft 1, pulmonary vein isolation and left atrial appendage ligation at [facility] clinic.  Echocardiogram March 2016 showed normal LV function. There was biatrial enlargement. Moderate pericardial effusion. Status post ascending aorta repair.    Neuro/Psych neg Seizures negative neurological ROS     GI/Hepatic Neg liver ROS, GERD  Medicated and Controlled,  Endo/Other  negative endocrine ROS  Renal/GU negative Renal ROS     Musculoskeletal   Abdominal   Peds  Hematology negative hematology ROS (+)   Anesthesia Other Findings   Reproductive/Obstetrics                          Anesthesia Physical Anesthesia Plan  ASA: III  Anesthesia Plan: MAC   Post-op Pain Management:    Induction: Intravenous  Airway Management Planned: Mask  Additional Equipment:   Intra-op Plan:    Post-operative Plan:   Informed Consent: I have reviewed the patients History and Physical, chart, labs and discussed the procedure including the risks, benefits and alternatives for the proposed anesthesia with the patient or authorized representative who has indicated his/her understanding and acceptance.   Dental advisory given  Plan Discussed with: CRNA  Anesthesia Plan Comments:        Anesthesia Quick Evaluation

## 2015-12-24 ENCOUNTER — Ambulatory Visit (HOSPITAL_COMMUNITY)
Admission: RE | Admit: 2015-12-24 | Discharge: 2015-12-24 | Disposition: A | Discharge status: Home / Self Care | Payer: Medicare Other | Source: Ambulatory Visit | Attending: Cardiology | Admitting: Cardiology

## 2015-12-24 ENCOUNTER — Encounter (HOSPITAL_COMMUNITY): Payer: Self-pay | Admitting: *Deleted

## 2015-12-24 ENCOUNTER — Ambulatory Visit (HOSPITAL_COMMUNITY): Payer: Medicare Other | Admitting: Anesthesiology

## 2015-12-24 ENCOUNTER — Encounter (HOSPITAL_COMMUNITY)
Admission: RE | Disposition: A | Discharge status: Home / Self Care | Payer: Self-pay | Source: Ambulatory Visit | Attending: Cardiology

## 2015-12-24 DIAGNOSIS — I4891 Unspecified atrial fibrillation: Secondary | ICD-10-CM | POA: Diagnosis present

## 2015-12-24 DIAGNOSIS — I2510 Atherosclerotic heart disease of native coronary artery without angina pectoris: Secondary | ICD-10-CM | POA: Insufficient documentation

## 2015-12-24 DIAGNOSIS — I480 Paroxysmal atrial fibrillation: Secondary | ICD-10-CM | POA: Insufficient documentation

## 2015-12-24 DIAGNOSIS — I712 Thoracic aortic aneurysm, without rupture: Secondary | ICD-10-CM | POA: Diagnosis not present

## 2015-12-24 DIAGNOSIS — I6529 Occlusion and stenosis of unspecified carotid artery: Secondary | ICD-10-CM | POA: Insufficient documentation

## 2015-12-24 DIAGNOSIS — Z951 Presence of aortocoronary bypass graft: Secondary | ICD-10-CM | POA: Insufficient documentation

## 2015-12-24 DIAGNOSIS — E785 Hyperlipidemia, unspecified: Secondary | ICD-10-CM | POA: Diagnosis not present

## 2015-12-24 DIAGNOSIS — K219 Gastro-esophageal reflux disease without esophagitis: Secondary | ICD-10-CM | POA: Insufficient documentation

## 2015-12-24 DIAGNOSIS — I10 Essential (primary) hypertension: Secondary | ICD-10-CM | POA: Diagnosis not present

## 2015-12-24 DIAGNOSIS — Z96653 Presence of artificial knee joint, bilateral: Secondary | ICD-10-CM | POA: Diagnosis not present

## 2015-12-24 HISTORY — PX: CARDIOVERSION: SHX1299

## 2015-12-24 SURGERY — CARDIOVERSION
Anesthesia: Monitor Anesthesia Care

## 2015-12-24 MED ORDER — PROPOFOL 10 MG/ML IV BOLUS
INTRAVENOUS | Status: DC | PRN
  Administered 2015-12-24: 20 mg via INTRAVENOUS
  Administered 2015-12-24: 80 mg via INTRAVENOUS

## 2015-12-24 MED ORDER — SODIUM CHLORIDE 0.9 % IV SOLN
INTRAVENOUS | Status: DC
  Administered 2015-12-24: 07:00:00 via INTRAVENOUS

## 2015-12-24 MED ORDER — LIDOCAINE HCL (CARDIAC) 20 MG/ML IV SOLN
INTRAVENOUS | Status: DC | PRN
Start: 2015-12-24 — End: 2015-12-24
  Administered 2015-12-24: 60 mg via INTRATRACHEAL

## 2015-12-24 MED ORDER — FENTANYL CITRATE (PF) 100 MCG/2ML IJ SOLN: 25.0000 ug | INTRAMUSCULAR | Status: DC | PRN

## 2015-12-24 MED ORDER — ONDANSETRON HCL 4 MG/2ML IJ SOLN: 4.0000 mg | Freq: Once | INTRAMUSCULAR | Status: DC | PRN

## 2015-12-24 NOTE — Anesthesia Postprocedure Evaluation (Signed)
Anesthesia Post Note  Patient: Antonio Carrillo  Procedure(s) Performed: Procedure(s) (LRB): CARDIOVERSION (N/A)  Patient location during evaluation: PACU Anesthesia Type: MAC Level of consciousness: awake and alert Pain management: pain level controlled Vital Signs Assessment: post-procedure vital signs reviewed and stable Respiratory status: spontaneous breathing, nonlabored ventilation and respiratory function stable Cardiovascular status: stable and blood pressure returned to baseline Anesthetic complications: no    Last Vitals:  Vitals:   12/24/15 0850 12/24/15 0904  BP: 134/68 (!) 157/69  Pulse: 65 (!) 56  Resp:    Temp:      Last Pain:  Vitals:   12/24/15 0813  TempSrc: Oral                 Nilda Simmer

## 2015-12-24 NOTE — Discharge Instructions (Signed)
Electrical Cardioversion, Care After °Refer to this sheet in the next few weeks. These instructions provide you with information on caring for yourself after your procedure. Your health care provider may also give you more specific instructions. Your treatment has been planned according to current medical practices, but problems sometimes occur. Call your health care provider if you have any problems or questions after your procedure. °WHAT TO EXPECT AFTER THE PROCEDURE °After your procedure, it is typical to have the following sensations: °· Some redness on the skin where the shocks were delivered. If this is tender, a sunburn lotion or hydrocortisone cream may help. °· Possible return of an abnormal heart rhythm within hours or days after the procedure. °HOME CARE INSTRUCTIONS °· Take medicines only as directed by your health care provider. Be sure you understand how and when to take your medicine. °· Learn how to feel your pulse and check it often. °· Limit your activity for 48 hours after the procedure or as directed by your health care provider. °· Avoid or minimize caffeine and other stimulants as directed by your health care provider. °SEEK MEDICAL CARE IF: °· You feel like your heart is beating too fast or your pulse is not regular. °· You have any questions about your medicines. °· You have bleeding that will not stop. °SEEK IMMEDIATE MEDICAL CARE IF: °· You are dizzy or feel faint. °· It is hard to breathe or you feel short of breath. °· There is a change in discomfort in your chest. °· Your speech is slurred or you have trouble moving an arm or leg on one side of your body. °· You get a serious muscle cramp that does not go away. °· Your fingers or toes turn cold or blue. °  °This information is not intended to replace advice given to you by your health care provider. Make sure you discuss any questions you have with your health care provider. °  °Document Released: 01/08/2013 Document Revised: 04/10/2014  Document Reviewed: 01/08/2013 °Elsevier Interactive Patient Education ©2016 [employer]vier Inc. ° °

## 2015-12-24 NOTE — H&P (View-Only) (Signed)
SUBJECTIVE: Antonio Carrillo is a 75 year old male who I am evaluating for the first time. He used to see Dr. [name]. Has a history of atrial fibrillation, hypertension, CABG, hyperlipidemia, ascending aortic aneurysm repair, as well as atrial fibrillation.  Lives in [place].  He was diagnosed with atrial fibrillation in 1977. He failed medical therapy with flecainide, rhythmol and multaq. Not tried on TIkosyn or sotalol due to prolonged QT. He underwent atrial flutter ablation in [place] late 1990s. Patient has had 3 previous atrial fibrillation ablations. In February 2016 the patient had aortic root replacement, coronary artery bypass graft 1, pulmonary vein isolation and left atrial appendage ligation at [facility] clinic. Chest CT March 2016 showed status post aortic root replacement and significant axillary, mediastinal and hilar adenopathy. Echocardiogram October 2016 showed normal LV systolic function, grade 3 diastolic dysfunction, mild aortic insufficiency, moderately dilated ascending aorta, severe left atrial enlargement and right atrial enlargement and mild pulmonic insufficiency. Carotid Dopplers April 2017 showed 40-59% right and 1-39% left stenosis.   He denies chest pain and shortness of breath. 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 He exercises at the gym 4 days per week and uses the bicycle. 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 He has difficulty sleeping. He occasionally has feet swelling.  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 He is originally from [place]. He is proficient in piano and takes lessons. As a child he studied at [employer]. 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   Review of Systems: As per "subjective", otherwise negative.  No Known  Allergies  Current Outpatient Prescriptions  Medication Sig Dispense Refill  . amiodarone (PACERONE) 200 MG tablet Take 1 tablet (200 mg total) by mouth daily. Reduce to 200 mg daily starting 07/15/15 as per EP instructions. 90 tablet 3  . amLODipine (NORVASC) 10 MG tablet Take 1 tablet (10 mg total) by mouth daily. 90 tablet 3  . ELIQUIS 5 MG TABS tablet TAKE ONE TABLET BY MOUTH 2 TIMES A DAY 60 tablet 5  . LORazepam (ATIVAN) 0.5 MG tablet Take 1 tablet by mouth 2 (two) times daily.    Marland Kitchen losartan (COZAAR) 100 MG tablet TAKE ONE TABLET BY MOUTH EVERY DAY 30 tablet 10  . lovastatin (MEVACOR) 20 MG tablet TAKE ONE TABLET BY MOUTH WITH EVENING MEAL 90 tablet 1  . pantoprazole (PROTONIX) 20 MG tablet TAKE ONE TABLET BY MOUTH EVERY DAY 30 tablet 0  . Probiotic Product (FLORAJEN3 PO) Take 2 capsules by mouth daily.    Marland Kitchen zolpidem (AMBIEN CR) 12.5 MG CR tablet Take 1 tablet by mouth at bedtime as needed. Sleep     No current facility-administered medications for this visit.     Past Medical History:  Diagnosis Date  . Anticoagulants causing adverse effect in therapeutic use   . Aortic aneurysm [facility])    s/p surgery at [facility]  . HTN (hypertension)   . Hyperlipidemia   . Hyperthyroidism    resolved per pt  . 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 Persistent atrial fibrillation (HCC)    s/p ablations x3  . Testicular failure     Past Surgical History:  Procedure Laterality Date  . APPENDECTOMY    . ATRIAL ABLATION SURGERY  1. late 1990's  2. 02/01/06  3. 10/10   1. In [place]  2. At [facility]  3. Redo by Greggory Brandy [facility])  . BACK SURGERY    . CARDIOVERSION N/A 09/16/2012   Procedure: CARDIOVERSION;  Surgeon: Jolaine Artist, MD;  Location: [facility] ENDOSCOPY;  Service: Cardiovascular;  Laterality: N/A;  angie will call back /heather [phone]  . CARDIOVERSION N/A 07/15/2015   Procedure: CARDIOVERSION;  Surgeon: Herminio Commons, MD;  Location: [place];  Service: Cardiovascular;  Laterality: N/A;    . LAMINECTOMY     x2  . Stress cardiolite  01/29/06  . 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 TONSILLECTOMY    . TOTAL KNEE ARTHROPLASTY     bilateral  . TRANSTHORACIC ECHOCARDIOGRAM  02/08/06    Social History   Social History  . Marital status: Married    Spouse name: N/A  . Number of children: N/A  . Years of education: N/A   Occupational History  . 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 Smoking status: Never Smoker  . Smokeless tobacco: Never Used  . Alcohol use No  . Drug use: No  . Sexual activity: Yes    Partners: Female   Other Topics Concern  . Not on file   Social History Narrative   Lives in [place].     Vitals:   12/21/15 0907  BP: (!) 142/80  Pulse: 98  SpO2: 96%  Weight: 234 lb (106.1 kg)  Height: 6' 2.5" (1.892 m)    PHYSICAL EXAM General: NAD HEENT: Normal. Neck: No JVD, no thyromegaly. Lungs: Clear to auscultation bilaterally with normal respiratory effort. CV: Nondisplaced PMI.  Regular rate and irregular rhythm, normal S1/S2, no S3, 1/4 holodiastolic murmur along LSB. Trivial pretibial and periankle edema.  No carotid bruit.   Abdomen: Soft, nontender, no distention.  Neurologic: Alert and oriented.  Psych: Normal affect. Skin: Normal. Musculoskeletal: No gross deformities.    ECG: Most recent ECG reviewed.      ASSESSMENT AND PLAN: 1. CAD with CABG: Stable. No changes. Continue lovastatin. Will obtain CBC and BMET.  2. Hyperlipidemia: Continue statin. Will obtain lipids.  3. HTN: Mildly elevated. Will monitor.  4. Carotid artery stenosis: Repeat Dopplers in 07/2016.  5. PAF/PAT: Continue amiodarone and apixaban. Follows with EP.  6. Aortic aneurysm repair: Stable.  Dispo: fu 6 months.   Time spent: 40 minutes, of which greater than 50% was spent reviewing symptoms, relevant blood tests and studies, and discussing management plan with the  patient.   Antonio Carrillo, M.D., F.A.C.C.  ADDENDUM: ECG showed atrial fibrillation, HR 99 bpm. Will start metoprolol 25 mg BID and have him see Doristine Devoid in a fib clinic.

## 2015-12-24 NOTE — Transfer of Care (Signed)
Immediate Anesthesia Transfer of Care Note  Patient: Antonio Carrillo  Procedure(s) Performed: Procedure(s): CARDIOVERSION (N/A)  Patient Location: Endoscopy Unit  Anesthesia Type:General  Level of Consciousness: awake, alert  and oriented  Airway & Oxygen Therapy: Patient Spontanous Breathing  Post-op Assessment: Report given to RN and Post -op Vital signs reviewed and stable  Post vital signs: Reviewed and stable  Last Vitals:  Vitals:   12/24/15 0715  BP: (!) 148/81  Pulse: 80  Resp: (!) 21  Temp: 36.6 C    Last Pain:  Vitals:   12/24/15 0715  TempSrc: Oral         Complications: No apparent anesthesia complications

## 2015-12-24 NOTE — CV Procedure (Signed)
   Electrical Cardioversion Procedure Note Antonio Carrillo [place]:[id number] 06-21-1940  Procedure: Electrical Cardioversion Indications:  Atrial Fibrillation  Time Out: Verified patient identification, verified procedure,medications/allergies/relevent history reviewed, required imaging and test results available.  Performed  Procedure Details  The patient was NPO after midnight. Anesthesia was administered at the beside  by Dr. Zenia Carrillo with 100mg  of propofol and 60mg  Lidocaine.  Cardioversion was done with synchronized biphasic defibrillation with AP pads with 150 watts.  The patient did not convert to normal sinus rhythm. Cardioversion was done with synchronized biphasic defibrillation with AP pads with 200 watts.  The patient converted to normal sinus rhythm. The patient tolerated the procedure well   IMPRESSION:  Successful cardioversion of atrial fibrillation    Antonio Carrillo 12/24/2015, 8:00 AM

## 2015-12-24 NOTE — Interval H&P Note (Signed)
History and Physical Interval Note:  12/24/2015 7:59 AM  Antonio Carrillo  has presented today for surgery, with the diagnosis of AFIB  The various methods of treatment have been discussed with the patient and family. After consideration of risks, benefits and other options for treatment, the patient has consented to  Procedure(s): CARDIOVERSION (N/A) as a surgical intervention .  The patient's history has been reviewed, patient examined, no change in status, stable for surgery.  I have reviewed the patient's chart and labs.  Questions were answered to the patient's satisfaction.     [name]

## 2015-12-25 ENCOUNTER — Encounter (HOSPITAL_COMMUNITY): Payer: Self-pay | Admitting: Cardiology

## 2016-01-03 ENCOUNTER — Inpatient Hospital Stay (HOSPITAL_COMMUNITY): Admission: RE | Admit: 2016-01-03 | Payer: Medicare Other | Source: Ambulatory Visit | Admitting: Nurse Practitioner

## 2016-01-06 ENCOUNTER — Inpatient Hospital Stay (HOSPITAL_COMMUNITY): Admission: RE | Admit: 2016-01-06 | Payer: Medicare Other | Source: Ambulatory Visit | Admitting: Nurse Practitioner

## 2016-01-11 ENCOUNTER — Inpatient Hospital Stay (HOSPITAL_COMMUNITY): Admission: RE | Admit: 2016-01-11 | Payer: Medicare Other | Source: Ambulatory Visit | Admitting: Nurse Practitioner

## 2016-02-03 ENCOUNTER — Encounter: Payer: Self-pay | Admitting: Cardiology

## 2016-02-08 ENCOUNTER — Telehealth: Payer: Self-pay | Admitting: Internal Medicine

## 2016-02-08 NOTE — Telephone Encounter (Signed)
Mr. nudelman is calling because he is having a very bad side effect from the Amlodipine and wants to know if he can get a substitute. Please call

## 2016-02-08 NOTE — Telephone Encounter (Signed)
I spoke with patient. He states after he started amlodipine he began having GI upset, fatigue, and foot/ankle swelling.  He reports stopping amlodipine x4d and symptoms have improved tremendously. Pt requesting medication change. Please advise.

## 2016-02-09 NOTE — Telephone Encounter (Signed)
Spoke with patient and let him know I would discuss with Dr Rayann Heman and get back with him tomorrow

## 2016-02-09 NOTE — Telephone Encounter (Signed)
Spoke with Dr Rayann Heman and he wants him to restart the Norvasc 5 mg daily until his appointment to establish with Dr Marlou Porch and at that point can decide on what mediation is best for him

## 2016-02-09 NOTE — Telephone Encounter (Signed)
Pt calling again, still hasn't had a call back concerning med change  Please call and advise

## 2016-02-10 NOTE — Telephone Encounter (Signed)
Called the patient back and advised he just stay on 5 mg of Norvasc until his appointment with Dr Marlou Porch to establish.  This morning his BP was 120/67.  He was appreciative of my call.

## 2016-02-18 ENCOUNTER — Ambulatory Visit: Payer: Medicare Other | Admitting: Cardiology

## 2016-02-22 ENCOUNTER — Encounter: Payer: Self-pay | Admitting: Cardiology

## 2016-03-16 ENCOUNTER — Ambulatory Visit (HOSPITAL_COMMUNITY): Payer: Medicare Other | Admitting: Nurse Practitioner

## 2016-06-29 ENCOUNTER — Other Ambulatory Visit: Payer: Self-pay | Admitting: Cardiology

## 2016-07-20 ENCOUNTER — Ambulatory Visit (HOSPITAL_COMMUNITY): Admission: RE | Admit: 2016-07-20 | Payer: Medicare Other | Source: Ambulatory Visit

## 2016-10-17 ENCOUNTER — Other Ambulatory Visit: Payer: Self-pay | Admitting: Cardiology

## 2016-11-10 ENCOUNTER — Other Ambulatory Visit: Payer: Self-pay | Admitting: Cardiology

## 2016-11-20 ENCOUNTER — Other Ambulatory Visit: Payer: Self-pay | Admitting: Cardiology

## 2016-11-20 NOTE — Telephone Encounter (Signed)
LOOKS LIKE PT HAS [facility] [id number]

## 2016-12-15 ENCOUNTER — Other Ambulatory Visit: Payer: Self-pay | Admitting: Cardiology

## 2017-04-13 ENCOUNTER — Encounter (HOSPITAL_COMMUNITY): Payer: Self-pay | Admitting: Physician Assistant

## 2017-04-13 ENCOUNTER — Ambulatory Visit (HOSPITAL_COMMUNITY)
Admission: RE | Admit: 2017-04-13 | Discharge: 2017-04-13 | Disposition: A | Discharge status: Home / Self Care | Payer: Medicare Other | Source: Ambulatory Visit | Attending: Internal Medicine | Admitting: Internal Medicine

## 2017-04-13 VITALS — BP 124/78 | HR 105 | Wt 223.0 lb

## 2017-04-13 DIAGNOSIS — Z79899 Other long term (current) drug therapy: Secondary | ICD-10-CM | POA: Insufficient documentation

## 2017-04-13 DIAGNOSIS — C9110 Chronic lymphocytic leukemia of B-cell type not having achieved remission: Secondary | ICD-10-CM | POA: Insufficient documentation

## 2017-04-13 DIAGNOSIS — E785 Hyperlipidemia, unspecified: Secondary | ICD-10-CM | POA: Insufficient documentation

## 2017-04-13 DIAGNOSIS — R188 Other ascites: Secondary | ICD-10-CM | POA: Diagnosis not present

## 2017-04-13 DIAGNOSIS — C9190 Lymphoid leukemia, unspecified not having achieved remission: Secondary | ICD-10-CM

## 2017-04-13 DIAGNOSIS — I10 Essential (primary) hypertension: Secondary | ICD-10-CM | POA: Diagnosis not present

## 2017-04-13 DIAGNOSIS — E8770 Fluid overload, unspecified: Secondary | ICD-10-CM | POA: Insufficient documentation

## 2017-04-13 DIAGNOSIS — Z951 Presence of aortocoronary bypass graft: Secondary | ICD-10-CM | POA: Insufficient documentation

## 2017-04-13 DIAGNOSIS — I5033 Acute on chronic diastolic (congestive) heart failure: Secondary | ICD-10-CM

## 2017-04-13 DIAGNOSIS — I4891 Unspecified atrial fibrillation: Secondary | ICD-10-CM

## 2017-04-13 DIAGNOSIS — I480 Paroxysmal atrial fibrillation: Secondary | ICD-10-CM | POA: Insufficient documentation

## 2017-04-13 DIAGNOSIS — Z7901 Long term (current) use of anticoagulants: Secondary | ICD-10-CM | POA: Insufficient documentation

## 2017-04-13 DIAGNOSIS — Q2543 Congenital aneurysm of aorta: Secondary | ICD-10-CM | POA: Diagnosis not present

## 2017-04-13 HISTORY — PX: IR PARACENTESIS: IMG2679

## 2017-04-13 MED ORDER — SPIRONOLACTONE 25 MG PO TABS: 50.0000 mg | ORAL_TABLET | Freq: Every day | ORAL | 3 refills | Status: DC

## 2017-04-13 MED ORDER — LIDOCAINE HCL (PF) 2 % IJ SOLN
INTRAMUSCULAR | Status: AC | PRN
  Administered 2017-04-13: 10 mL

## 2017-04-13 MED ORDER — AMIODARONE HCL 200 MG PO TABS: 200.0000 mg | ORAL_TABLET | Freq: Two times a day (BID) | ORAL | 3 refills | Status: DC

## 2017-04-13 MED ORDER — LIDOCAINE 2% (20 MG/ML) 5 ML SYRINGE
INTRAMUSCULAR | Status: AC
  Filled 2017-04-13: qty 10

## 2017-04-13 MED ORDER — BUMETANIDE 2 MG PO TABS: 3.0000 mg | ORAL_TABLET | Freq: Two times a day (BID) | ORAL | 3 refills | Status: DC

## 2017-04-13 NOTE — Progress Notes (Signed)
ADVANCED HF CLINIC CONSULT NOTE   Primary Cardiologist: Antonio Carrillo  HPI:  Antonio Carrillo is a 77 y.o. male referred by Dr. Stanford Carrillo for further evaluation of his HF.   He was diagnosed with atrial fibrillation in 1977. He failed medical therapy with flecainide, rhythmol and multaq. Not tried on TIkosyn or sotalol due to prolonged QT.Was started on amio. Was on amio for many years but stopped in 2017 for unclear reasons. He underwent atrial flutter ablation in [place] late 1990s. Patient has had 3 previous atrial fibrillation ablations. In February 2016 the patient had aortic root replacement with CABG 1, Maze and left atrial appendage ligation at [facility] clinic.  More recently has been followed at [facility]. 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 Has been treated with Venetoclax, he has had fluid retention. [place] 5/18 today with RA 3, v to 6, PA 27/8 (mean 15, PCWP 9 on right, 5 on left, PA sat 57%, CP/CI TD 6.4/3.1; F 4.4/2.1  He has had frequent adjustments of his diuretics. Recently started on metolazone in addition to bumex. Had brisk response and felt it was too much so they stopped. Also had recurrent AF. Underwent DC-CV 03/07/17 at [facility]. Went back into AF at end of December.   Currently on bumex 2mg  bid and spiro 25mg  daily. Not taking metolazone. Weight now about 220. Says goal weight under 200. Very fatigued. Hard to do ADLs. + edema. Marked ab distension. No orthopnea or PND. Denies palpitations or syncope  Recent CT scan 04/06/17 showed anasarca, ascites and small pleural effusions and significant retroperitoneal lymphadenopathy  [facility]MRI at [facility] 4/18 1. Left ventricle is severely dilated (volume index 120 ml/m2) with  normal wall thickness. Left ventricular (LV) systolic function is  preserved (EF 78% with diastolic and systolic septal flattening consistent with right ventricular pressure/volume overload.   2. There is no late  gadolinium enhancement (LGE) in the LV myocardium.  3. Right ventricle (RV) is severely dilated (volume index 181 ml/m2) with mildly reduced systolic function (EF 29%). There is no evidence of  ventricular interdependence.  4. There is severe biatrial enlargement. The interatrial septum bows towards the left atrium, suggestive of increased right atrial pressure.   5. Visualized valves (aortic, mitral, and tricuspid) appear pliable. There is regurgitation present of all three valves, although this was  not quantified on this study.   6. There is no pericardial effusion. Pericardium does not appear thickened. There is no LGE in the pericardium.   Labs 04/12/17  Sodium 141 K 4.1 Cr 0.84 Bili 1.5 Albumin 4.0  Review of Systems: [y] = yes, [ ]  = no   General: Weight gain [ y]; Weight loss [ ] ; Anorexia [ ] ; Fatigue Blue.[name] ]; Fever [ ] ; Chills [ ] ; Weakness [ y]  Cardiac: Chest pain/pressure [ ] ; Resting SOB [ y]; Exertional SOB [ y]; Orthopnea [ ] ; Pedal Edema [ y]; Palpitations [ ] ; Syncope [ ] ; Presyncope [ ] ; Paroxysmal nocturnal dyspnea[ ]   Pulmonary: Cough [ ] ; Wheezing[ ] ; Hemoptysis[ ] ; Sputum [ ] ; Snoring [ ]   GI: Vomiting[ ] ; Dysphagia[ ] ; Melena[ ] ; Hematochezia [ ] ; Heartburn[ ] ; Abdominal pain [ ] ; Constipation [ ] ; Diarrhea [ ] ; BRBPR [ ]   GU: Hematuria[ ] ; Dysuria [ ] ; Nocturia[ ]   Vascular: Pain in legs with walking [ ] ; Pain in feet with lying flat [ ] ; Non-healing sores [ ] ; Stroke [ ] ; TIA [ ] ; Slurred speech [ ] ;  Neuro: Headaches[ ] ; Vertigo[ ] ; Seizures[ ] ; Paresthesias[ ] ;Blurred  vision [ ] ; Diplopia [ ] ; Vision changes [ ]   Ortho/Skin: Arthritis Blue.[name] ]; Joint pain Blue.[name] ]; Muscle pain [ ] ; Joint swelling [ ] ;  Back Pain [ ] ; Rash [ ]   Psych: Depression[ y]; Anxiety[ ]   Heme: Bleeding problems [ ] ; Clotting disorders [ ] ; Anemia Blue.[name] ]  Endocrine: Diabetes [ ] ; Thyroid dysfunction[ ]    Past Medical History:  Diagnosis Date  . Anticoagulants causing adverse effect in therapeutic use   . Aortic aneurysm [facility])    s/p surgery at [facility]  . HTN (hypertension)   . Hyperlipidemia   . Hyperthyroidism    resolved per pt  . 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 Persistent atrial fibrillation (HCC)    s/p ablations x3  . Testicular failure     Current Outpatient Medications  Medication Sig Dispense Refill  . allopurinol (ZYLOPRIM) 300 MG tablet Take 300 mg by mouth daily.    Marland Kitchen apixaban (ELIQUIS) 5 MG TABS tablet TAKE ONE TABLET BY MOUTH 2 TIMES A DAY 180 tablet 3  . bumetanide (BUMEX) 2 MG tablet Take 2 mg by mouth 2 (two) times daily.    . iron polysaccharides (NIFEREX) 150 MG capsule Take 150 mg by mouth daily.    Marland Kitchen LORazepam (ATIVAN) 0.5 MG tablet Take 0.5 mg by mouth every 8 (eight) hours as needed for anxiety.    . metoprolol succinate (TOPROL-XL) 50 MG 24 hr tablet Take 50 mg by mouth daily. Take with or immediately following a meal.    . ondansetron (ZOFRAN) 8 MG tablet Take 8 mg by mouth every 8 (eight) hours as needed for nausea or vomiting.    . pantoprazole (PROTONIX) 20 MG tablet TAKE ONE TABLET BY MOUTH EVERY DAY 30 tablet 0  . potassium chloride SA (K-DUR,KLOR-CON) 20 MEQ tablet Take 40 mEq by mouth daily.    . prochlorperazine (COMPAZINE) 10 MG tablet Take 10 mg by mouth every 6 (six) hours as needed for nausea or vomiting.    . sertraline (ZOLOFT) 100 MG tablet Take 100 mg by mouth 2 (two) times daily.    Marland Kitchen spironolactone (ALDACTONE) 25 MG tablet Take 25 mg by mouth daily.    . Venetoclax (VENCLEXTA) 100 MG TABS Take 100 mg by mouth daily.    Marland Kitchen zolpidem (AMBIEN CR) 12.5 MG CR tablet Take 1 tablet by mouth at bedtime as needed. Sleep     No current facility-administered medications  for this encounter.     No Known Allergies    Social History   Socioeconomic History  . Marital status: Married    Spouse name: Not on file  . Number of children: Not on file  . Years of education: Not on file  . Highest education level: Not on file  Social Needs  . Financial resource strain: Not on file  . Food insecurity - worry: Not on file  . Food insecurity - inability: Not on file  . Transportation needs - medical: Not on file  . Transportation needs - non-medical: Not on file  Occupational History  . 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 Smoking status: Never Smoker  . Smokeless tobacco: Never Used  Substance and Sexual Activity  . Alcohol use: No  . Drug use: No  . Sexual activity: Yes    Partners: Female  Other Topics Concern  . Not on file  Social History Narrative   Lives in [place].  Family History  Problem Relation Age of Onset  . Arrhythmia Mother     Vitals:   04/13/17 0953  BP: 124/78  Pulse: (!) 105  SpO2: 97%  Weight: 223 lb (101.2 kg)    PHYSICAL EXAM: General:  Fatigued appearing. No respiratory difficulty HEENT: normal Neck: supple. jvp 10-11 cm with prominent v-wavesCarotids 2+ bilat; no bruits. No lymphadenopathy or thryomegaly appreciated. Cor: PMI nondisplaced. Tach irregular rate & rhythm. No rubs, gallops or murmurs. Lungs: clear Abdomen: soft, nontender, markedly distended. No hepatosplenomegaly. No bruits or masses. Good bowel sounds. Extremities: no cyanosis, clubbing, rash, 1-2+ edema Neuro: alert & oriented x 3, cranial nerves grossly intact. moves all 4 extremities w/o difficulty. Affect pleasant.  ECG: AF with RVR 105 RBBB Personally reviewed    ASSESSMENT & PLAN: 1. 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 with normal RV and LV functions but dilated ventricles and atria - Markedly volume overloaded with R>L symptoms and  severe ascites on exam - Will arrange for paracentesis today (with cytology) - Increase bumex to 3 bid - Increase spiro to 50 daily - BMET in 1 week  2. Recurrent PAF - s/p multiple ablations and MAZE. Failed recent DC-CV - this has been ongoing issue for him. Rate a bit fast today. May be contributing to HF - restart amio 200 bid - continue Eliquis - if still in AF in 2 weeks will repeat DC-CV  3. CLL - per Oncology  4. 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

## 2017-04-13 NOTE — Procedures (Signed)
PROCEDURE SUMMARY:  Successful US guided paracentesis from left lateral abdomen.  Yielded 7.5 liters of clear yellow fluid.  No immediate complications.  Pt tolerated well.    Whittley Carandang S Sharissa Brierley PA-C 04/13/2017 3:18 PM

## 2017-04-13 NOTE — Patient Instructions (Signed)
Paracentesis has been scheduled for you today.  INCREASE Bumex to 3 mg (1.5 Tablets) twice Daily  INCREASE Spironolactone to 50 mg (2 Tablets) Once Daily  START taking amiodarone 200 mg (1 Tablet) Twice Daily  Labs in 1 week (bmet)  Follow up in 2 weeks.

## 2017-04-20 ENCOUNTER — Ambulatory Visit (HOSPITAL_COMMUNITY)
Admission: RE | Admit: 2017-04-20 | Discharge: 2017-04-20 | Disposition: A | Discharge status: Home / Self Care | Payer: Medicare Other | Source: Ambulatory Visit | Attending: Internal Medicine | Admitting: Internal Medicine

## 2017-04-20 DIAGNOSIS — I4891 Unspecified atrial fibrillation: Secondary | ICD-10-CM | POA: Diagnosis not present

## 2017-04-20 LAB — BASIC METABOLIC PANEL
Anion gap: 11 (ref 5–15)
BUN: 25 mg/dL — AB (ref 6–20)
CALCIUM: 9.1 mg/dL (ref 8.9–10.3)
CO2: 28 mmol/L (ref 22–32)
CREATININE: 0.94 mg/dL (ref 0.61–1.24)
Chloride: 99 mmol/L — ABNORMAL LOW (ref 101–111)
GFR calc Af Amer: >60 mL/min (ref >60)
GFR calc non Af Amer: >60 mL/min (ref >60)
GLUCOSE: 138 mg/dL — AB (ref 65–99)
Potassium: 3.8 mmol/L (ref 3.5–5.1)
Sodium: 138 mmol/L (ref 135–145)

## 2017-04-25 ENCOUNTER — Other Ambulatory Visit: Payer: Self-pay

## 2017-04-25 ENCOUNTER — Encounter (HOSPITAL_COMMUNITY): Payer: Self-pay | Admitting: Internal Medicine

## 2017-04-25 ENCOUNTER — Other Ambulatory Visit: Payer: Self-pay | Admitting: Cardiology

## 2017-04-25 ENCOUNTER — Ambulatory Visit (HOSPITAL_COMMUNITY)
Admission: RE | Admit: 2017-04-25 | Discharge: 2017-04-25 | Disposition: A | Discharge status: Home / Self Care | Payer: Medicare Other | Source: Ambulatory Visit | Attending: Internal Medicine | Admitting: Internal Medicine

## 2017-04-25 VITALS — BP 140/80 | HR 68 | Wt 216.4 lb

## 2017-04-25 DIAGNOSIS — R590 Localized enlarged lymph nodes: Secondary | ICD-10-CM | POA: Insufficient documentation

## 2017-04-25 DIAGNOSIS — I5032 Chronic diastolic (congestive) heart failure: Secondary | ICD-10-CM | POA: Diagnosis not present

## 2017-04-25 DIAGNOSIS — Z951 Presence of aortocoronary bypass graft: Secondary | ICD-10-CM | POA: Diagnosis not present

## 2017-04-25 DIAGNOSIS — I4581 Long QT syndrome: Secondary | ICD-10-CM | POA: Diagnosis not present

## 2017-04-25 DIAGNOSIS — Z9889 Other specified postprocedural states: Secondary | ICD-10-CM | POA: Insufficient documentation

## 2017-04-25 DIAGNOSIS — I4510 Unspecified right bundle-branch block: Secondary | ICD-10-CM | POA: Insufficient documentation

## 2017-04-25 DIAGNOSIS — I480 Paroxysmal atrial fibrillation: Secondary | ICD-10-CM | POA: Diagnosis not present

## 2017-04-25 DIAGNOSIS — E0590 Thyrotoxicosis, unspecified without thyrotoxic crisis or storm: Secondary | ICD-10-CM | POA: Diagnosis not present

## 2017-04-25 DIAGNOSIS — Z79899 Other long term (current) drug therapy: Secondary | ICD-10-CM | POA: Diagnosis not present

## 2017-04-25 DIAGNOSIS — Z7901 Long term (current) use of anticoagulants: Secondary | ICD-10-CM | POA: Diagnosis not present

## 2017-04-25 DIAGNOSIS — I110 Hypertensive heart disease with heart failure: Secondary | ICD-10-CM | POA: Insufficient documentation

## 2017-04-25 DIAGNOSIS — I4892 Unspecified atrial flutter: Secondary | ICD-10-CM | POA: Diagnosis present

## 2017-04-25 DIAGNOSIS — E785 Hyperlipidemia, unspecified: Secondary | ICD-10-CM | POA: Insufficient documentation

## 2017-04-25 DIAGNOSIS — R001 Bradycardia, unspecified: Secondary | ICD-10-CM | POA: Diagnosis not present

## 2017-04-25 DIAGNOSIS — I50810 Right heart failure, unspecified: Secondary | ICD-10-CM | POA: Diagnosis not present

## 2017-04-25 DIAGNOSIS — I719 Aortic aneurysm of unspecified site, without rupture: Secondary | ICD-10-CM | POA: Insufficient documentation

## 2017-04-25 DIAGNOSIS — R0602 Shortness of breath: Secondary | ICD-10-CM | POA: Diagnosis present

## 2017-04-25 DIAGNOSIS — R188 Other ascites: Secondary | ICD-10-CM | POA: Insufficient documentation

## 2017-04-25 DIAGNOSIS — I481 Persistent atrial fibrillation: Secondary | ICD-10-CM | POA: Diagnosis not present

## 2017-04-25 DIAGNOSIS — C9110 Chronic lymphocytic leukemia of B-cell type not having achieved remission: Secondary | ICD-10-CM | POA: Diagnosis not present

## 2017-04-25 HISTORY — PX: IR PARACENTESIS: IMG2679

## 2017-04-25 MED ORDER — LIDOCAINE 2% (20 MG/ML) 5 ML SYRINGE
INTRAMUSCULAR | Status: AC
  Filled 2017-04-25: qty 10

## 2017-04-25 MED ORDER — LIDOCAINE HCL (PF) 1 % IJ SOLN
INTRAMUSCULAR | Status: DC | PRN
  Administered 2017-04-25: 10 mL

## 2017-04-25 NOTE — Patient Instructions (Addendum)
Paracentesis today at 2 pm, arrive at 1:40 PM to [place] has requested that you have an echocardiogram. Echocardiography is a painless test that uses sound waves to create images of your heart. It provides your doctor with information about the size and shape of your heart and how well your heart's chambers and valves are working. This procedure takes approximately one hour. There are no restrictions for this procedure.  Your physician recommends that you schedule a follow-up appointment in: 3 weeks

## 2017-04-25 NOTE — Procedures (Signed)
PROCEDURE SUMMARY:  Successful US guided paracentesis from left lateral abdomen.  Yielded 6.4 liters of blood-tinged fluid.  No immediate complications.  Pt tolerated well.   Specimen was sent for labs.  Docia Barrier PA-C 04/25/2017 2:26 PM

## 2017-04-25 NOTE — Progress Notes (Signed)
ADVANCED HF CLINIC CONSULT NOTE   Primary Cardiologist: [name]  HPI:  Antonio Carrillo is a 77 y.o. male referred by Dr. [name] for further evaluation of his HF.   He was diagnosed with atrial fibrillation in 1977. He failed medical therapy with flecainide, rhythmol and multaq. Not tried on TIkosyn or sotalol due to prolonged QT.Was started on amio. Was on amio for many years but stopped in 2017 for unclear reasons. He underwent atrial flutter ablation in [place] late 1990s. Patient has had 3 previous atrial fibrillation ablations. In February 2016 the patient had aortic root replacement with CABG 1, Maze and left atrial appendage ligation at [facility] clinic.  More recently has been followed at [facility]. 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 Has been treated with Venetoclax, he has had fluid retention. [place] 5/18 today with RA 3, v to 6, PA 27/8 (mean 15, PCWP 9 on right, 5 on left, PA sat 57%, CP/CI TD 6.4/3.1; F 4.4/2.1  He has had frequent adjustments of his diuretics. Recently started on metolazone in addition to bumex. Had brisk response and felt it was too much so they stopped. Also had recurrent AF. Underwent DC-CV 03/07/17 at [facility]. Went back into AF at end of December.   We saw him 2 weeks ago and arranged for paracentesis which he had over 7L off. No malignant cells in ascitic fluid.  Bumex increased to 3 bid and spiro increased to 50 daily. Also started on amio for PAF. Remains very weak. Hard to do ADLs. Thristy all the time. Weight back up 6 pounds. Drinking a lot of fluid. + LE edema. About to begin XRT for pelvic lymph nodes.   Labs 04/20/17: Na 138 K 3.8 Cr 0.94  Recent CT scan 04/06/17 showed anasarca, ascites and small pleural effusions and significant retroperitoneal lymphadenopathy  cMRI at [facility] 4/18 1. Left ventricle is severely dilated (volume index 120 ml/m2) with  normal wall thickness. Left ventricular (LV) systolic function is  preserved (EF 62% with  diastolic and systolic septal flattening consistent with right ventricular pressure/volume overload.   2. There is no late gadolinium enhancement (LGE) in the LV myocardium.  3. Right ventricle (RV) is severely dilated (volume index 181 ml/m2) with mildly reduced systolic function (EF 70%). There is no evidence of  ventricular interdependence.  4. There is severe biatrial enlargement. The interatrial septum bows towards the left atrium, suggestive of increased right atrial pressure.   5. Visualized valves (aortic, mitral, and tricuspid) appear pliable. There is regurgitation present of all three valves, although this was  not quantified on this study.   6. There is no pericardial effusion. Pericardium does not appear thickened. There is no LGE in the pericardium.   Labs 04/12/17  Sodium 141 K 4.1 Cr 0.84 Bili 1.5 Albumin 4.0   Past Medical History:  Diagnosis Date  . Anticoagulants causing adverse effect in therapeutic use   . Aortic aneurysm [facility])    s/p surgery at [facility]  . HTN (hypertension)   . Hyperlipidemia   . Hyperthyroidism    resolved per pt  . 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 Persistent atrial fibrillation (HCC)    s/p ablations x3  . Testicular failure     Current Outpatient Medications  Medication Sig Dispense Refill  . allopurinol (ZYLOPRIM) 300 MG tablet Take 300 mg by mouth daily.    Marland Kitchen amiodarone (PACERONE) 200 MG tablet Take 1 tablet (200 mg total) by mouth 2 (two) times daily. 60 tablet  3  . apixaban (ELIQUIS) 5 MG TABS tablet TAKE ONE TABLET BY MOUTH 2 TIMES A DAY 180 tablet 3  . bumetanide (BUMEX) 2 MG tablet Take 1.5 tablets (3 mg  total) by mouth 2 (two) times daily. 90 tablet 3  . iron polysaccharides (NIFEREX) 150 MG capsule Take 150 mg by mouth daily.    Marland Kitchen LORazepam (ATIVAN) 0.5 MG tablet Take 0.5 mg by mouth every 8 (eight) hours as needed for anxiety.    . metoprolol succinate (TOPROL-XL) 50 MG 24 hr tablet Take 50 mg by mouth daily. Take with or immediately following a meal.    . ondansetron (ZOFRAN) 8 MG tablet Take 8 mg by mouth every 8 (eight) hours as needed for nausea or vomiting.    . pantoprazole (PROTONIX) 20 MG tablet TAKE ONE TABLET BY MOUTH EVERY DAY 30 tablet 0  . potassium chloride SA (K-DUR,KLOR-CON) 20 MEQ tablet Take 40 mEq by mouth daily.    . prochlorperazine (COMPAZINE) 10 MG tablet Take 10 mg by mouth every 6 (six) hours as needed for nausea or vomiting.    . sertraline (ZOLOFT) 100 MG tablet Take 100 mg by mouth 2 (two) times daily.    Marland Kitchen spironolactone (ALDACTONE) 25 MG tablet Take 2 tablets (50 mg total) by mouth daily. 60 tablet 3  . temazepam (RESTORIL) 30 MG capsule Take 30 mg by mouth at bedtime as needed for sleep.    . Venetoclax (VENCLEXTA) 100 MG TABS Take 100 mg by mouth daily.     No current facility-administered medications for this encounter.     No Known Allergies    Social History   Socioeconomic History  . Marital status: Married    Spouse name: Not on file  . Number of children: Not on file  . Years of education: Not on file  . Highest education level: Not on file  Social Needs  . Financial resource strain: Not on file  . Food insecurity - worry: Not on file  . Food insecurity - inability: Not on file  . Transportation needs - medical: Not on file  . Transportation needs - non-medical: Not on file  Occupational History  . 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 Smoking status: Never Smoker  . Smokeless tobacco: Never Used  Substance and Sexual Activity  . Alcohol use: No  . Drug use: No  . Sexual activity:  Yes    Partners: Female  Other Topics Concern  . Not on file  Social History Narrative   Lives in [place].      Family History  Problem Relation Age of Onset  . Arrhythmia Mother     Vitals:   04/25/17 1042  BP: 140/80  Pulse: 68  SpO2: 95%  Weight: 216 lb 6.4 oz (98.2 kg)   Weight on 04/13/17 223  PHYSICAL EXAM: General:  Weak appearing. No resp difficulty HEENT: normal Neck: supple. no JVP to jaw with very prominent CV waves. Carotids 2+ bilat; no bruits. No lymphadenopathy or thryomegaly appreciated. Cor: PMI nondisplaced. IRR, IRR + RV lift No rubs, gallops  2/6 TR murmur. Lungs: clear dull at R base Abdomen: soft, nontender, ++ distended. No hepatosplenomegaly. No bruits or masses. Good bowel sounds. Extremities: no cyanosis, clubbing, rash, trace edema Neuro: alert & orientedx3, cranial nerves grossly intact. moves all 4 extremities w/o difficulty. Affect pleasant   ECG: SB 56 RBBB Personally reviewed    ASSESSMENT &  PLAN: 1. Acute on chronic biventricular HF with diastolic dysfunction - cMRI 4/18 at [facility] with normal RV and LV functions but dilated ventricles and atria - Continues with severe R heart failure despite return of NSR with amio. Has recurrent ascites on exam  - Will arrange for repeat paracentesis today - Continue bumex to 3 bid - Continue spiro to 50 daily. Can consider titrating at next visit.  - BMET in 1 week - I am concerned that we do not have many good options for him. Will see if he improves now that he is back in NSR. Unclear what role, if any, his pelvic lymphadenopathy is plying in his ascites. Cytology from last paracentesis without malignant cells.   2. Recurrent PAF - s/p multiple ablations and MAZE. Failed recent DC-CV - this has been ongoing issue for him.  - Amio 200 bid restarted 04/13/17 - back in NSR today - continue Eliquis  3. CLL - per Oncology - pending initiation of pelvic XRT this week  4. Aortic root aneurysm  s/p repair and CABG x 1 - stable  Total time spent 35 minutes. Over half that time spent discussing above.   Glori Bickers, MD  10:55 AM

## 2017-04-26 MED ORDER — PANTOPRAZOLE SODIUM 20 MG PO TBEC: 20.0000 mg | DELAYED_RELEASE_TABLET | Freq: Every day | ORAL | 0 refills | Status: DC

## 2017-05-18 ENCOUNTER — Ambulatory Visit (HOSPITAL_COMMUNITY)
Admission: RE | Admit: 2017-05-18 | Discharge: 2017-05-18 | Disposition: A | Discharge status: Home / Self Care | Payer: Medicare Other | Source: Ambulatory Visit | Attending: Internal Medicine | Admitting: Internal Medicine

## 2017-05-18 ENCOUNTER — Encounter (HOSPITAL_COMMUNITY): Payer: Self-pay | Admitting: Internal Medicine

## 2017-05-18 VITALS — BP 124/58 | HR 50 | Wt 208.0 lb

## 2017-05-18 DIAGNOSIS — Z7901 Long term (current) use of anticoagulants: Secondary | ICD-10-CM | POA: Insufficient documentation

## 2017-05-18 DIAGNOSIS — R188 Other ascites: Secondary | ICD-10-CM

## 2017-05-18 DIAGNOSIS — Z951 Presence of aortocoronary bypass graft: Secondary | ICD-10-CM | POA: Insufficient documentation

## 2017-05-18 DIAGNOSIS — I481 Persistent atrial fibrillation: Secondary | ICD-10-CM | POA: Diagnosis present

## 2017-05-18 DIAGNOSIS — Q2543 Congenital aneurysm of aorta: Secondary | ICD-10-CM | POA: Diagnosis not present

## 2017-05-18 DIAGNOSIS — I080 Rheumatic disorders of both mitral and aortic valves: Secondary | ICD-10-CM | POA: Insufficient documentation

## 2017-05-18 DIAGNOSIS — Z9889 Other specified postprocedural states: Secondary | ICD-10-CM | POA: Insufficient documentation

## 2017-05-18 DIAGNOSIS — R590 Localized enlarged lymph nodes: Secondary | ICD-10-CM | POA: Insufficient documentation

## 2017-05-18 DIAGNOSIS — I50810 Right heart failure, unspecified: Secondary | ICD-10-CM

## 2017-05-18 DIAGNOSIS — R0602 Shortness of breath: Secondary | ICD-10-CM

## 2017-05-18 DIAGNOSIS — Z79899 Other long term (current) drug therapy: Secondary | ICD-10-CM | POA: Insufficient documentation

## 2017-05-18 DIAGNOSIS — I4510 Unspecified right bundle-branch block: Secondary | ICD-10-CM | POA: Insufficient documentation

## 2017-05-18 DIAGNOSIS — I4892 Unspecified atrial flutter: Secondary | ICD-10-CM | POA: Diagnosis not present

## 2017-05-18 DIAGNOSIS — I5032 Chronic diastolic (congestive) heart failure: Secondary | ICD-10-CM

## 2017-05-18 DIAGNOSIS — I110 Hypertensive heart disease with heart failure: Secondary | ICD-10-CM | POA: Diagnosis not present

## 2017-05-18 DIAGNOSIS — R001 Bradycardia, unspecified: Secondary | ICD-10-CM | POA: Diagnosis not present

## 2017-05-18 DIAGNOSIS — E785 Hyperlipidemia, unspecified: Secondary | ICD-10-CM | POA: Insufficient documentation

## 2017-05-18 DIAGNOSIS — C9110 Chronic lymphocytic leukemia of B-cell type not having achieved remission: Secondary | ICD-10-CM | POA: Diagnosis not present

## 2017-05-18 DIAGNOSIS — E0590 Thyrotoxicosis, unspecified without thyrotoxic crisis or storm: Secondary | ICD-10-CM | POA: Insufficient documentation

## 2017-05-18 DIAGNOSIS — I4891 Unspecified atrial fibrillation: Secondary | ICD-10-CM

## 2017-05-18 DIAGNOSIS — I480 Paroxysmal atrial fibrillation: Secondary | ICD-10-CM

## 2017-05-18 HISTORY — PX: IR PARACENTESIS: IMG2679

## 2017-05-18 LAB — COMPREHENSIVE METABOLIC PANEL
ALK PHOS: 95 U/L (ref 38–126)
ALT: 10 U/L — ABNORMAL LOW (ref 17–63)
ANION GAP: 12 (ref 5–15)
AST: 17 U/L (ref 15–41)
Albumin: 3.7 g/dL (ref 3.5–5.0)
BUN: 23 mg/dL — AB (ref 6–20)
CALCIUM: 9.1 mg/dL (ref 8.9–10.3)
CO2: 26 mmol/L (ref 22–32)
Chloride: 97 mmol/L — ABNORMAL LOW (ref 101–111)
Creatinine, Ser: 0.94 mg/dL (ref 0.61–1.24)
GFR calc Af Amer: >60 mL/min (ref >60)
Glucose, Bld: 140 mg/dL — ABNORMAL HIGH (ref 65–99)
Potassium: 4 mmol/L (ref 3.5–5.1)
Sodium: 135 mmol/L (ref 135–145)
TOTAL PROTEIN: 5.4 g/dL — AB (ref 6.5–8.1)
Total Bilirubin: 1.3 mg/dL — ABNORMAL HIGH (ref 0.3–1.2)

## 2017-05-18 LAB — CBC
HEMATOCRIT: 31.4 % — AB (ref 39.0–52.0)
Hemoglobin: 9.9 g/dL — ABNORMAL LOW (ref 13.0–17.0)
MCH: 26.4 pg (ref 26.0–34.0)
MCHC: 31.5 g/dL (ref 30.0–36.0)
MCV: 83.7 fL (ref 78.0–100.0)
Platelets: 62 10*3/uL — ABNORMAL LOW (ref 150–400)
RBC: 3.75 MIL/uL — ABNORMAL LOW (ref 4.22–5.81)
RDW: 23.7 % — AB (ref 11.5–15.5)
WBC: 3.3 10*3/uL — AB (ref 4.0–10.5)

## 2017-05-18 MED ORDER — ALLOPURINOL 300 MG PO TABS: 300.0000 mg | ORAL_TABLET | Freq: Every day | ORAL | 3 refills | Status: DC

## 2017-05-18 MED ORDER — LIDOCAINE HCL (PF) 1 % IJ SOLN
INTRAMUSCULAR | Status: DC | PRN
  Administered 2017-05-18: 10 mL

## 2017-05-18 MED ORDER — PANTOPRAZOLE SODIUM 20 MG PO TBEC: 20.0000 mg | DELAYED_RELEASE_TABLET | Freq: Every day | ORAL | 3 refills | Status: DC

## 2017-05-18 MED ORDER — LIDOCAINE 2% (20 MG/ML) 5 ML SYRINGE
INTRAMUSCULAR | Status: AC
  Filled 2017-05-18: qty 10

## 2017-05-18 NOTE — Progress Notes (Signed)
  Echocardiogram 2D Echocardiogram has been performed.  Johny Chess 05/18/2017, 10:01 AM

## 2017-05-18 NOTE — Procedures (Signed)
  US guided RLQ paracentesis  5 L dark bloody fluid No labs per MD  Tolerated well

## 2017-05-18 NOTE — Progress Notes (Signed)
ADVANCED HF CLINIC NOTE   Primary Cardiologist: [name]  HPI:  Antonio Carrillo is a 77 y.o. male referred by Dr. [name] for further evaluation of his HF.   He was diagnosed with atrial fibrillation in 1977. He failed medical therapy with flecainide, rhythmol and multaq. Not tried on TIkosyn or sotalol due to prolonged QT.Was started on amio. Was on amio for many years but stopped in 2017 for unclear reasons. He underwent atrial flutter ablation in [place] late 1990s. Patient has had 3 previous atrial fibrillation ablations. In February 2016 the patient had aortic root replacement with CABG 1, Maze and left atrial appendage ligation at [facility] clinic.  More recently has been followed at [facility]. 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 Has been treated with Venetoclax, he has had fluid retention. [place] 5/18 today with RA 3, v to 6, PA 27/8 (mean 15, PCWP 9 on right, 5 on left, PA sat 57%, CP/CI TD 6.4/3.1; F 4.4/2.1  He has had frequent adjustments of his diuretics in the setting of severe RV failure. Recently started on metolazone in addition to bumex. Had brisk response and felt it was too much so they stopped. Also had recurrent AF. Underwent DC-CV 03/07/17 at [facility]. Went back into AF at end of December.   We saw him in early January2019 and arranged for paracentesis which he had over 7L off. We saw him again in late January and had repeat paracentesis with 6.4L off. No malignant cells in ascitic fluid.  Bumex increased to 3 bid and spiro increased to 50 daily. Also started on amio for PAF.   Returns for f/u . Recently underwent pelvic XRT x 2 for CLL and bulky lymphadenopathy. Continues on bumex 6 bid. Cutting fluid intake back. Weight stable at 208 LE edema improved but still with ab bloating. Dizzy on some days but no syncope. HR stable in 50-60s on home monitor   Echo today reviewed personally LVEF normal RV markedly dilated and moderately HK. Moderate TR. IVC dilated   Recent CT scan 04/06/17  showed anasarca, ascites and small pleural effusions and significant retroperitoneal lymphadenopathy  cMRI at [facility] 4/18 1. Left ventricle is severely dilated (volume index 120 ml/m2) with  normal wall thickness. Left ventricular (LV) systolic function is  preserved (EF 98% with diastolic and systolic septal flattening consistent with right ventricular pressure/volume overload.   2. There is no late gadolinium enhancement (LGE) in the LV myocardium.  3. Right ventricle (RV) is severely dilated (volume index 181 ml/m2) with mildly reduced systolic function (EF 92%). There is no evidence of  ventricular interdependence.  4. There is severe biatrial enlargement. The interatrial septum bows towards the left atrium, suggestive of increased right atrial pressure.   5. Visualized valves (aortic, mitral, and tricuspid) appear pliable. There is regurgitation present of all three valves, although this was  not quantified on this study.   6. There is no pericardial effusion. Pericardium does not appear thickened. There is no LGE in the pericardium.   Labs 04/12/17  Sodium 141 K 4.1 Cr 0.84 Bili 1.5 Albumin 4.0   Past Medical History:  Diagnosis Date  . Anticoagulants causing adverse effect in therapeutic use   . Aortic aneurysm [facility])    s/p surgery at [facility]  . HTN (hypertension)   . Hyperlipidemia   . Hyperthyroidism    resolved per pt  . 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 Persistent atrial fibrillation (HCC)    s/p ablations x3  . Testicular  failure     Current Outpatient Medications  Medication Sig Dispense Refill  . allopurinol (ZYLOPRIM)  300 MG tablet Take 300 mg by mouth daily.    Marland Kitchen amiodarone (PACERONE) 200 MG tablet Take 1 tablet (200 mg total) by mouth 2 (two) times daily. 60 tablet 3  . apixaban (ELIQUIS) 5 MG TABS tablet TAKE ONE TABLET BY MOUTH 2 TIMES A DAY 180 tablet 3  . bumetanide (BUMEX) 2 MG tablet Take 1.5 tablets (3 mg total) by mouth 2 (two) times daily. 90 tablet 3  . iron polysaccharides (NIFEREX) 150 MG capsule Take 150 mg by mouth daily.    Marland Kitchen LORazepam (ATIVAN) 0.5 MG tablet Take 0.5 mg by mouth every 8 (eight) hours as needed for anxiety.    . metoprolol succinate (TOPROL-XL) 50 MG 24 hr tablet Take 50 mg by mouth daily. Take with or immediately following a meal.    . ondansetron (ZOFRAN) 8 MG tablet Take 8 mg by mouth every 8 (eight) hours as needed for nausea or vomiting.    . pantoprazole (PROTONIX) 20 MG tablet Take 1 tablet (20 mg total) by mouth daily. 30 tablet 0  . potassium chloride SA (K-DUR,KLOR-CON) 20 MEQ tablet Take 40 mEq by mouth daily.    . prochlorperazine (COMPAZINE) 10 MG tablet Take 10 mg by mouth every 6 (six) hours as needed for nausea or vomiting.    . sertraline (ZOLOFT) 100 MG tablet Take 100 mg by mouth 2 (two) times daily.    Marland Kitchen spironolactone (ALDACTONE) 25 MG tablet Take 2 tablets (50 mg total) by mouth daily. 60 tablet 3  . temazepam (RESTORIL) 30 MG capsule Take 30 mg by mouth at bedtime as needed for sleep.    . Venetoclax (VENCLEXTA) 100 MG TABS Take 100 mg by mouth daily.     No current facility-administered medications for this encounter.     No Known Allergies    Social History   Socioeconomic History  . Marital status: Married    Spouse name: Not on file  . Number of children: Not on file  . Years of education: Not on file  . Highest education level: Not on file  Social Needs  . Financial resource strain: Not on file  . Food insecurity - worry: Not on file  . Food insecurity - inability: Not on file  . Transportation needs - medical: Not on file  .  Transportation needs - non-medical: Not on file  Occupational History  . 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 Smoking status: Never Smoker  . Smokeless tobacco: Never Used  Substance and Sexual Activity  . Alcohol use: No  . Drug use: No  . Sexual activity: Yes    Partners: Female  Other Topics Concern  . Not on file  Social History Narrative   Lives in [place].      Family History  Problem Relation Age of Onset  . Arrhythmia Mother     Vitals:   05/18/17 1025  BP: (!) 124/58  Pulse: (!) 50  SpO2: 100%  Weight: 208 lb (94.3 kg)   Weight on 04/13/17 223  PHYSICAL EXAM: General:  Weak appearing. No resp difficulty HEENT: normal Neck: supple. JVP 10. Carotids 2+ bilat; no bruits. No lymphadenopathy or thryomegaly appreciated. Cor: PMI nondisplaced. Regular rate & rhythm. +RV lift 2/6 TR Lungs: clear Abdomen: soft, nontender, + distended. No hepatosplenomegaly. No bruits or masses. Good bowel  sounds. Extremities: no cyanosis, clubbing, rash, trace edema Neuro: alert & orientedx3, cranial nerves grossly intact. moves all 4 extremities w/o difficulty. Affect pleasant   ECG: SB 54 RBBB Personally reviewed    ASSESSMENT & PLAN: 1. 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 Normal LVEF with significant RV failure - Continues with severe R heart failure despite return of NSR with amio. Has recurrent ascites on exam  - Will arrange for repeat paracentesis today for symptom management.  - I had concern for cardiac cirrhosis but albumin remains 3.7 and recent CT without mention of cirrhotic changes. 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 Cytology from last paracentesis without malignant cells.  - Continue bumex to 3 bid. If dizzy cut back to 3 daily on those days    - Continue spiro to 50 daily.  - Labs today - No clear for RHC currently   2. Recurrent PAF - s/p multiple ablations and MAZE. Failed recent DC-CV - this has been ongoing issue for him.  - Amio 200 bid restarted 04/13/17 - Remains in NSR today. Continue amio at 200 bid for now  - continue Eliquis  3. CLL - per Oncology - now s/p pelvic XRT   4. 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 Over half that time spent discussing above.    Glori Bickers, MD  10:39 AM

## 2017-05-18 NOTE — Patient Instructions (Signed)
Labs today (will call for abnormal results, otherwise no news is good news)  You have been scheduled for a Paracentesis this afternoon at 2:00 pm today.  Please arrive at Radiology by 1:45 pm.   If dizzy you may cut Bumex to 3 mg Daily.  Follow up in 6-8 weeks.

## 2017-06-14 ENCOUNTER — Encounter (HOSPITAL_COMMUNITY): Payer: Self-pay | Admitting: Internal Medicine

## 2017-06-15 NOTE — Telephone Encounter (Signed)
Called patient and advised him to go to the emergency room to be evaluated. Per Nira Conn. Patient agreed with plan.

## 2017-06-18 ENCOUNTER — Telehealth (HOSPITAL_COMMUNITY): Payer: Self-pay | Admitting: *Deleted

## 2017-06-18 DIAGNOSIS — R188 Other ascites: Secondary | ICD-10-CM

## 2017-06-18 NOTE — Telephone Encounter (Signed)
Pt left VM earlier today wanting a call back regarding swelling in his abd.  I called and spoke w/pt, he states his abd has swollen back up with fluid, it is tight and distended.  He would to have another paracentesis done.  He denies SOB and states he had slight ankle edema last week which has now resolved.  Per Dr Haroldine Laws pt's last paracentesis was 2/15 w/5 L out, ok to repeat.  Paracentesis sch for Thur 3/21 at 10 am, pt aware

## 2017-06-21 ENCOUNTER — Encounter (HOSPITAL_COMMUNITY): Payer: Self-pay | Admitting: Interventional Radiology

## 2017-06-21 ENCOUNTER — Ambulatory Visit (HOSPITAL_COMMUNITY)
Admission: RE | Admit: 2017-06-21 | Discharge: 2017-06-21 | Disposition: A | Discharge status: Home / Self Care | Payer: Medicare Other | Source: Ambulatory Visit | Attending: Internal Medicine | Admitting: Internal Medicine

## 2017-06-21 DIAGNOSIS — R188 Other ascites: Secondary | ICD-10-CM | POA: Insufficient documentation

## 2017-06-21 HISTORY — PX: IR PARACENTESIS: IMG2679

## 2017-06-21 MED ORDER — LIDOCAINE HCL (PF) 2 % IJ SOLN
INTRAMUSCULAR | Status: AC
  Filled 2017-06-21: qty 10

## 2017-06-21 MED ORDER — LIDOCAINE HCL (PF) 2 % IJ SOLN
INTRAMUSCULAR | Status: AC | PRN
  Administered 2017-06-21: 10 mL

## 2017-06-21 NOTE — Procedures (Signed)
PROCEDURE SUMMARY:  Successful US guided paracentesis from RLQ.  Yielded 8.9 L of clear yellow fluid.  No immediate complications.  Pt tolerated well.   Specimen was not sent for labs.  Ascencion Dike PA-C 06/21/2017 11:25 AM

## 2017-07-04 ENCOUNTER — Encounter (HOSPITAL_COMMUNITY): Payer: Medicare Other | Admitting: Internal Medicine

## 2017-07-23 ENCOUNTER — Encounter (HOSPITAL_COMMUNITY): Payer: Self-pay | Admitting: Internal Medicine

## 2017-07-24 ENCOUNTER — Encounter: Payer: Self-pay | Admitting: Gastroenterology

## 2017-07-25 ENCOUNTER — Other Ambulatory Visit (HOSPITAL_COMMUNITY): Payer: Self-pay

## 2017-07-25 DIAGNOSIS — R188 Other ascites: Secondary | ICD-10-CM

## 2017-07-25 NOTE — Progress Notes (Signed)
para

## 2017-07-26 ENCOUNTER — Ambulatory Visit (HOSPITAL_COMMUNITY)
Admission: RE | Admit: 2017-07-26 | Discharge: 2017-07-26 | Disposition: A | Discharge status: Home / Self Care | Payer: Medicare Other | Source: Ambulatory Visit | Attending: Internal Medicine | Admitting: Internal Medicine

## 2017-07-26 ENCOUNTER — Telehealth: Payer: Self-pay | Admitting: Hematology

## 2017-07-26 ENCOUNTER — Encounter: Payer: Self-pay | Admitting: Hematology

## 2017-07-26 ENCOUNTER — Encounter (HOSPITAL_COMMUNITY): Payer: Self-pay | Admitting: Interventional Radiology

## 2017-07-26 ENCOUNTER — Other Ambulatory Visit (HOSPITAL_COMMUNITY): Payer: Self-pay

## 2017-07-26 ENCOUNTER — Encounter (HOSPITAL_COMMUNITY): Admission: RE | Admit: 2017-07-26 | Payer: Medicare Other | Source: Ambulatory Visit

## 2017-07-26 DIAGNOSIS — R188 Other ascites: Secondary | ICD-10-CM | POA: Diagnosis present

## 2017-07-26 HISTORY — PX: IR PARACENTESIS: IMG2679

## 2017-07-26 MED ORDER — LIDOCAINE HCL (PF) 2 % IJ SOLN
INTRAMUSCULAR | Status: AC
  Filled 2017-07-26: qty 20

## 2017-07-26 NOTE — Telephone Encounter (Signed)
Appt has been scheduled for the pt to see Dr. Irene Limbo on 5/1 at [place]. Pt is a transfer from [facility], wanting a 2nd opinion. Aware to arrive 30 minutes early. Letter and directions mailed.

## 2017-07-26 NOTE — Procedures (Signed)
PROCEDURE SUMMARY:  Successful US guided paracentesis from RLQ.  Yielded 6 L of amber colored fluid.  No immediate complications.  Pt tolerated well.   Specimen was sent for labs.  Ascencion Dike PA-C 07/26/2017 11:04 AM

## 2017-07-29 ENCOUNTER — Other Ambulatory Visit (HOSPITAL_COMMUNITY): Payer: Self-pay | Admitting: Internal Medicine

## 2017-07-30 NOTE — Progress Notes (Incomplete)
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 The bx was performed in response to a 01/02/14 CT Chest which showed extensive supraclavicular and mediastinal adenopathy as well as the retoperitoneal area and a portocaval node of 6.9 x 3.4cm. The pt began systemic therapy of Ibrutinib x4 days in February 2018 which was subsequently stopped for heart failure and Afib complications. On 12/27/16 the pt began Rituximab and on 01/16/17 daily 100mg  Venetoclax was begun. The pt also underwent radiation therapy on 05/03/17 to 05/04/17 with 4 Gy in 2 fractions to the abdomen.   HISTORY OF PRESENTING ILLNESS:  Antonio Carrillo is a wonderful 77 y.o. male who has been referred to [place] by Dr Antonio Carrillo at [facility] for evaluation and management of CLL. He is accompanied today by ***. The pt reports that he is doing well overall and ***.   The pt reports ***   Of note prior to the patient's visit today, pt has had *** completed on *** with results revealing ***.   Most recent lab results (05/18/17) of CBC  is as follows: all values are WNL except for WBC at 3.3k, RBC at 3.75, Hgb at 9.9, HCT at 31.4, RDW at 23.7, Platelets at 62k.  On review of systems, pt reports *** and denies ***.   On PMHx the pt reports ***. On Social Hx the pt reports *** On Family Hx the pt reports ***  A: -Discussed patient's most recent labs from ***, *** -***    MEDICAL HISTORY:  Past Medical History:  Diagnosis Date   Anticoagulants causing adverse effect in therapeutic use    Aortic aneurysm (HCC)    s/p surgery at [facility]   HTN (hypertension)    Hyperlipidemia    Hyperthyroidism    resolved per pt   Lymphoma, low  grade (HCC)    Persistent atrial fibrillation (HCC)    s/p ablations x3   Testicular failure     SURGICAL HISTORY: Past Surgical History:  Procedure Laterality Date   APPENDECTOMY     ATRIAL ABLATION SURGERY  1. late 1990's  2. 02/01/06  3. 10/10   1. In [place]  2. At [facility]  3. Redo by Greggory Brandy Zacarias Pontes)   BACK SURGERY     CARDIOVERSION N/A 09/16/2012   Procedure: CARDIOVERSION;  Surgeon: Jolaine Artist, MD;  Location: [facility] ENDOSCOPY;  Service: Cardiovascular;  Laterality: N/A;  angie will call back /heather [phone]   CARDIOVERSION N/A 07/15/2015   Procedure: CARDIOVERSION;  Surgeon: Herminio Commons, MD;  Location: [place];  Service: Cardiovascular;  Laterality: N/A;   CARDIOVERSION N/A 12/24/2015   Procedure: CARDIOVERSION;  Surgeon: Sueanne Margarita, MD;  Location: [place];  Service: Cardiovascular;  Laterality: N/A;   IR PARACENTESIS  04/13/2017   IR PARACENTESIS  04/25/2017   IR PARACENTESIS  05/18/2017   IR PARACENTESIS  06/21/2017   IR PARACENTESIS  07/26/2017   LAMINECTOMY     x2   Stress cardiolite  01/29/06   THORACIC AORTIC ANEURYSM REPAIR  2/16   anomalous RCA also repaired at [place]     bilateral   TRANSTHORACIC ECHOCARDIOGRAM  02/08/06    SOCIAL HISTORY:  Social History   Socioeconomic History   Marital status: Married    Spouse name: Not on file   Number of children: Not on file   Years of education: Not on file   Highest education level: Not on file  Occupational History   Occupation: retired    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: Not on file   Food insecurity:    Worry: Not on file    Inability: Not on file   Transportation needs:    Medical: Not on file    Non-medical: Not on file  Tobacco Use   Smoking status: Never Smoker   Smokeless tobacco: Never Used  Substance and Sexual Activity   Alcohol use: No   Drug use:  No   Sexual activity: Yes    Partners: Female  Lifestyle   Physical activity:    Days per week: Not on file    Minutes per session: Not on file   Stress: Not on file  Relationships   Social connections:    Talks on phone: Not on file    Gets together: Not on file    Attends religious service: Not on file    Active member of club or organization: Not on file    Attends meetings of clubs or organizations: Not on file    Relationship status: Not on file   Intimate partner violence:    Fear of current or ex partner: Not on file    Emotionally abused: Not on file    Physically abused: Not on file    Forced sexual activity: Not on file  Other Topics Concern   Not on file  Social History Narrative   Lives in [place].    FAMILY HISTORY: Family History  Problem Relation Age of Onset   Arrhythmia Mother     ALLERGIES:  has No Known Allergies.  MEDICATIONS:  Current Outpatient Medications  Medication Sig Dispense Refill   allopurinol (ZYLOPRIM) 300 MG tablet Take 1 tablet (300 mg total) by mouth daily. 30 tablet 3   amiodarone (PACERONE) 200 MG tablet Take 1 tablet (200 mg total) by mouth 2 (two) times daily. 60 tablet 3   apixaban (ELIQUIS) 5 MG TABS tablet TAKE ONE TABLET BY MOUTH 2 TIMES A DAY 180 tablet 3   bumetanide (BUMEX) 2 MG tablet Take 1.5 tablets (3 mg total) by mouth 2 (two) times daily. 90 tablet 3   iron polysaccharides (NIFEREX) 150 MG capsule Take 150 mg by mouth daily.     LORazepam (ATIVAN) 0.5 MG tablet Take 0.5 mg by mouth every 8 (eight) hours as needed for anxiety.     metoprolol succinate (TOPROL-XL) 50 MG 24 hr tablet Take 50 mg by mouth daily. Take with or immediately following a meal.     ondansetron (ZOFRAN) 8 MG tablet Take 8 mg by mouth every 8 (eight) hours as needed for nausea or vomiting.     pantoprazole (PROTONIX) 20 MG tablet Take 1 tablet (20 mg total) by mouth daily. 30 tablet 3   potassium chloride SA (K-DUR,KLOR-CON) 20  MEQ tablet Take 40 mEq by mouth daily.     prochlorperazine (COMPAZINE) 10 MG tablet Take 10 mg by mouth every 6 (six) hours as needed for nausea or vomiting.     sertraline (ZOLOFT) 100 MG tablet Take 100 mg by mouth 2 (two) times daily.     spironolactone (ALDACTONE) 25 MG tablet Take 2  tablets (50 mg total) by mouth daily. 60 tablet 3   temazepam (RESTORIL) 30 MG capsule Take 30 mg by mouth at bedtime as needed for sleep.     Venetoclax (VENCLEXTA) 100 MG TABS Take 100 mg by mouth daily.     No current facility-administered medications for this visit.     REVIEW OF SYSTEMS:    10 Point review of Systems was done is negative except as noted above.  PHYSICAL EXAMINATION: ECOG PERFORMANCE STATUS: {CHL ONC ECOG HD:[id number]}  .There were no vitals filed for this visit. There were no vitals filed for this visit. .There is no height or weight on file to calculate BMI.  GENERAL:alert, in no acute distress and comfortable SKIN: no acute rashes, no significant lesions EYES: conjunctiva are pink and non-injected, sclera anicteric OROPHARYNX: MMM, no exudates, no oropharyngeal erythema or ulceration NECK: supple, no JVD LYMPH:  no palpable lymphadenopathy in the cervical, axillary or inguinal regions LUNGS: clear to auscultation b/l with normal respiratory effort HEART: regular rate & rhythm ABDOMEN:  normoactive bowel sounds , non tender, not distended. Extremity: no pedal edema PSYCH: alert & oriented x 3 with fluent speech NEURO: no focal motor/sensory deficits  LABORATORY DATA:  I have reviewed the data as listed  . CBC Latest Ref Rng & Units 05/18/2017 12/22/2015 07/09/2015  WBC 4.0 - 10.5 K/uL 3.3(L) 11.2(H) 7.5  Hemoglobin 13.0 - 17.0 g/dL 9.9(L) 12.6(L) 13.8  Hematocrit 39.0 - 52.0 % 31.4(L) 39.8 42.0  Platelets 150 - 400 K/uL 62(L) 111(L) 142(L)    . CMP Latest Ref Rng & Units 05/18/2017 04/20/2017 12/22/2015  Glucose 65 - 99 mg/dL 140(H) 138(H) 122(H)  BUN 6 - 20  mg/dL 23(H) 25(H) 27(H)  Creatinine 0.61 - 1.24 mg/dL 0.94 0.94 0.77  Sodium 135 - 145 mmol/L 135 138 140  Potassium 3.5 - 5.1 mmol/L 4.0 3.8 4.3  Chloride 101 - 111 mmol/L 97(L) 99(L) 108  CO2 22 - 32 mmol/L 26 28 26   Calcium 8.9 - 10.3 mg/dL 9.1 9.1 9.3  Total Protein 6.5 - 8.1 g/dL 5.4(L) - -  Total Bilirubin 0.3 - 1.2 mg/dL 1.3(H) - -  Alkaline Phos 38 - 126 U/L 95 - -  AST 15 - 41 U/L 17 - -  ALT 17 - 63 U/L 10(L) - -     RADIOGRAPHIC STUDIES: I have personally reviewed the radiological images as listed and agreed with the findings in the report. Ir Paracentesis  Result Date: 07/26/2017 INDICATION: Abdominal distention secondary to recurrent ascites. Request for or diagnostic and therapeutic paracentesis. EXAM: ULTRASOUND GUIDED RIGHT LOWER QUADRANT PARACENTESIS MEDICATIONS: None. COMPLICATIONS: None immediate. PROCEDURE: Informed written consent was obtained from the patient after a discussion of the risks, benefits and alternatives to treatment. A timeout was performed prior to the initiation of the procedure. Initial ultrasound scanning demonstrates a large amount of ascites within the right lower abdominal quadrant. The right lower abdomen was prepped and draped in the usual sterile fashion. 1% lidocaine with epinephrine was used for local anesthesia. Following this, a 19 gauge, 7-cm, Yueh catheter was introduced. An ultrasound image was saved for documentation purposes. The paracentesis was performed. The catheter was removed and a dressing was applied. The patient tolerated the procedure well without immediate post procedural complication. FINDINGS: A total of approximately 6 L of amber colored fluid was removed. Samples were sent to the laboratory as requested by the clinical team. IMPRESSION: Successful ultrasound-guided paracentesis yielding 6 liters of peritoneal fluid. Read by: Lennette Bihari  Bruning PA-C Electronically Signed   By: Lucrezia Europe M.D.   On: 07/26/2017 11:06    ASSESSMENT &  PLAN:  77 y.o. male with  1. CLL with 17p deletion ***   ***  All of the patients questions were answered with apparent satisfaction. The patient knows to call the clinic with any problems, questions or concerns.  I spent *** minutes counseling the patient face to face. The total time spent in the appointment was *** minutes and more than 50% was on counseling and direct patient cares.    Sullivan Lone MD [place] AAHIVMS [facility] Hematology/Oncology Physician [facility]  (Office):       [phone] (Work cell):  [phone] (Fax):           [phone]  07/30/2017 5:04 PM  This document serves as a record of services personally performed by Sullivan Lone, MD. It was created on his behalf by Baldwin Jamaica, a trained medical scribe. The creation of this record is based on the scribe's personal observations and the provider's statements to them.   {Add scribe attestation statement}

## 2017-08-01 ENCOUNTER — Inpatient Hospital Stay: Payer: Medicare Other | Attending: Hematology | Admitting: Hematology

## 2017-08-06 ENCOUNTER — Ambulatory Visit (HOSPITAL_COMMUNITY)
Admission: RE | Admit: 2017-08-06 | Discharge: 2017-08-06 | Disposition: A | Discharge status: Home / Self Care | Payer: Medicare Other | Source: Ambulatory Visit | Attending: Internal Medicine | Admitting: Internal Medicine

## 2017-08-06 ENCOUNTER — Encounter (HOSPITAL_COMMUNITY): Payer: Self-pay | Admitting: Internal Medicine

## 2017-08-06 VITALS — BP 145/75 | HR 79 | Wt 180.1 lb

## 2017-08-06 DIAGNOSIS — I5032 Chronic diastolic (congestive) heart failure: Secondary | ICD-10-CM

## 2017-08-06 DIAGNOSIS — C9190 Lymphoid leukemia, unspecified not having achieved remission: Secondary | ICD-10-CM

## 2017-08-06 DIAGNOSIS — I481 Persistent atrial fibrillation: Secondary | ICD-10-CM | POA: Insufficient documentation

## 2017-08-06 DIAGNOSIS — C9110 Chronic lymphocytic leukemia of B-cell type not having achieved remission: Secondary | ICD-10-CM | POA: Insufficient documentation

## 2017-08-06 DIAGNOSIS — I480 Paroxysmal atrial fibrillation: Secondary | ICD-10-CM

## 2017-08-06 DIAGNOSIS — R188 Other ascites: Secondary | ICD-10-CM

## 2017-08-06 DIAGNOSIS — E785 Hyperlipidemia, unspecified: Secondary | ICD-10-CM | POA: Diagnosis not present

## 2017-08-06 DIAGNOSIS — I4510 Unspecified right bundle-branch block: Secondary | ICD-10-CM | POA: Diagnosis not present

## 2017-08-06 DIAGNOSIS — Z7901 Long term (current) use of anticoagulants: Secondary | ICD-10-CM | POA: Insufficient documentation

## 2017-08-06 DIAGNOSIS — E538 Deficiency of other specified B group vitamins: Secondary | ICD-10-CM | POA: Insufficient documentation

## 2017-08-06 DIAGNOSIS — Z923 Personal history of irradiation: Secondary | ICD-10-CM | POA: Diagnosis not present

## 2017-08-06 DIAGNOSIS — I5082 Biventricular heart failure: Secondary | ICD-10-CM | POA: Insufficient documentation

## 2017-08-06 DIAGNOSIS — E0590 Thyrotoxicosis, unspecified without thyrotoxic crisis or storm: Secondary | ICD-10-CM | POA: Insufficient documentation

## 2017-08-06 DIAGNOSIS — I110 Hypertensive heart disease with heart failure: Secondary | ICD-10-CM | POA: Diagnosis not present

## 2017-08-06 DIAGNOSIS — Z951 Presence of aortocoronary bypass graft: Secondary | ICD-10-CM | POA: Diagnosis not present

## 2017-08-06 DIAGNOSIS — Z79899 Other long term (current) drug therapy: Secondary | ICD-10-CM | POA: Diagnosis not present

## 2017-08-06 MED ORDER — BUMETANIDE 2 MG PO TABS: 3.0000 mg | ORAL_TABLET | Freq: Two times a day (BID) | ORAL | 6 refills | Status: DC

## 2017-08-06 MED ORDER — SPIRONOLACTONE 25 MG PO TABS: 50.0000 mg | ORAL_TABLET | Freq: Every day | ORAL | 6 refills | Status: DC

## 2017-08-06 NOTE — Progress Notes (Signed)
ADVANCED HF CLINIC NOTE   Primary Cardiologist: [name]  HPI:  Antonio Carrillo is a 77 y.o. Male with h/o AF s/p several ablations, aortic root aneurysm, s/p aortic root replacement with CABG 1, RV failure and CLL.   He was diagnosed with atrial fibrillation in 1977. He failed medical therapy with flecainide, rhythmol and multaq. Not tried on TIkosyn or sotalol due to prolonged QT.Was started on amio. Was on amio for many years but stopped in 2017 for unclear reasons. He underwent atrial flutter ablation in [place] late 1990s. Patient has had 3 previous atrial fibrillation ablations. In February 2016 the patient had aortic root replacement with CABG 1, Maze and left atrial appendage ligation at [facility] clinic.  More recently has been followed at [facility]. 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 Has been treated with Venetoclax, he has had fluid retention. [place] 5/18 with RA 3, v to 6, PA 27/8 (mean 15, PCWP 9 on right, 5 on left, PA sat 57%, CP/CI TD 6.4/3.1; F 4.4/2.1  He has had frequent adjustments of his diuretics in the setting of severe RV failure. Previously started on metolazone in addition to bumex. Had brisk response and felt it was too much so they stopped. Also had recurrent AF. Underwent DC-CV 03/07/17 at [facility]. Went back into AF at end of December.   We saw him in early January 2019 and arranged for paracentesis which he had over 7L off. We saw him again in late January and had repeat paracentesis with 6.4L off. No malignant cells in ascitic fluid.  Bumex increased to 3 bid and spiro increased to 50 daily. Also started on amio for PAF.   In the Spring underwent pelvic XRT x 10 for CLL and bulky lymphadenopathy. Also recently stopped Vanclexta.  Here for routinely f/u. Underwent paracentesis on 07/26/17 with 6L off. Recently also had severe diarrhea and seen by PCP. Work-up normal except for B12 deficiency. Diarrhea now improving. About 2 weeks ago started on digoxin 0.125 MWF by his previous cardiologist. HR  has gone up from 40-50s to 70s. Continues on bumex 3mg  bid. After stopping Vanclexta, says he feels better than he has in a long time. Strength much better. No orthopnea or PND.   Echo 05/18/17 reviewed personally LVEF normal RV markedly dilated and moderately HK. Moderate TR. IVC dilated   Recent CT scan 04/06/17 showed anasarca, ascites and small pleural effusions and significant retroperitoneal lymphadenopathy  cMRI at [facility] 4/18 1. Left ventricle is severely dilated (volume index 120 ml/m2) with  normal wall thickness. Left ventricular (LV) systolic function is  preserved (EF 73% with diastolic and systolic septal flattening consistent with right ventricular pressure/volume overload.   2. There is no late gadolinium enhancement (LGE) in the LV myocardium.  3. Right ventricle (RV) is severely dilated (volume index 181 ml/m2) with mildly reduced systolic function (EF 22%). There is no evidence of  ventricular interdependence.  4. There is severe biatrial enlargement. The interatrial septum bows towards the left atrium, suggestive of increased right atrial pressure.   5. Visualized valves (aortic, mitral, and tricuspid) appear pliable. There is regurgitation present of all three valves, although this was  not quantified on this study.   6. There is no pericardial effusion. Pericardium does not appear thickened. There is no LGE in the pericardium.   Labs 04/12/17  Sodium 141 K 4.1 Cr 0.84 Bili 1.5 Albumin 4.0   Past Medical History:  Diagnosis Date  . Anticoagulants causing adverse effect in therapeutic use   .  Aortic aneurysm [facility])    s/p surgery at  [facility]  . HTN (hypertension)   . Hyperlipidemia   . Hyperthyroidism    resolved per pt  . 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 Persistent atrial fibrillation (HCC)    s/p ablations x3  . Testicular failure     Current Outpatient Medications  Medication Sig Dispense Refill  . amiodarone (PACERONE) 200 MG tablet Take 1 tablet (200 mg total) by mouth 2 (two) times daily. (Patient taking differently: Take 200 mg by mouth daily. ) 60 tablet 3  . apixaban (ELIQUIS) 5 MG TABS tablet TAKE ONE TABLET BY MOUTH 2 TIMES A DAY 180 tablet 3  . bumetanide (BUMEX) 2 MG tablet Take 1.5 tablets (3 mg total) by mouth 2 (two) times daily. 90 tablet 3  . digoxin (LANOXIN) 0.125 MG tablet Take by mouth daily.    . iron polysaccharides (NIFEREX) 150 MG capsule Take 150 mg by mouth daily.    Marland Kitchen LORazepam (ATIVAN) 0.5 MG tablet Take 0.5 mg by mouth every 8 (eight) hours as needed for anxiety.    . metoprolol succinate (TOPROL-XL) 50 MG 24 hr tablet Take 50 mg by mouth daily. Take with or immediately following a meal.    . ondansetron (ZOFRAN) 8 MG tablet Take 8 mg by mouth every 8 (eight) hours as needed for nausea or vomiting.    . potassium chloride SA (K-DUR,KLOR-CON) 20 MEQ tablet Take 40 mEq by mouth daily.    . prochlorperazine (COMPAZINE) 10 MG tablet Take 10 mg by mouth every 6 (six) hours as needed for nausea or vomiting.    . sertraline (ZOLOFT) 100 MG tablet Take 100 mg by mouth 2 (two) times daily.    Marland Kitchen spironolactone (ALDACTONE) 25 MG tablet Take 2 tablets (50 mg total) by mouth daily. 60 tablet 3  . temazepam (RESTORIL) 30 MG capsule Take 30 mg by mouth at bedtime as needed for sleep.     No current facility-administered medications for this encounter.     No Known Allergies    Social History   Socioeconomic History  . Marital status: Married    Spouse name: Not on file  . Number of children: Not on file  . Years of education: Not on file  . Highest education level: Not on file    Occupational History  . 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 Financial resource strain: Not on file  . Food insecurity:    Worry: Not on file    Inability: Not on file  . Transportation needs:    Medical: Not on file    Non-medical: Not on file  Tobacco Use  . Smoking status: Never Smoker  . Smokeless tobacco: Never Used  Substance and Sexual Activity  . Alcohol use: No  . Drug use: No  . Sexual activity: Yes    Partners: Female  Lifestyle  . Physical activity:    Days per week: Not on file    Minutes per session: Not on file  . Stress: Not on file  Relationships  . Social connections:    Talks on phone: Not on file    Gets together: Not on file    Attends religious service: Not on file    Active member of club or organization: Not on file    Attends meetings of clubs or organizations: Not on file    Relationship status: Not on  file  . Intimate partner violence:    Fear of current or ex partner: Not on file    Emotionally abused: Not on file    Physically abused: Not on file    Forced sexual activity: Not on file  Other Topics Concern  . Not on file  Social History Narrative   Lives in [place].      Family History  Problem Relation Age of Onset  . Arrhythmia Mother     Vitals:   08/06/17 1456  BP: (!) 145/75  Pulse: 79  SpO2: 100%  Weight: 180 lb 1.9 oz (81.7 kg)   Weight on 04/13/17 223  PHYSICAL EXAM: General:  Looks much stronger today. Able to ambulate around clinic without any difficulty.. No resp difficulty HEENT: normal Neck: supple. JVP 7 Carotids 2+ bilat; no bruits. No lymphadenopathy or thryomegaly appreciated. Cor: PMI nondisplaced. Irregular rate & rhythm. 2/6 TR Lungs: clear Abdomen: soft, nontender, minimally distended. No hepatosplenomegaly. No bruits or masses. Good bowel sounds. Extremities: no cyanosis, clubbing, rash, edema Neuro: alert & orientedx3, cranial nerves grossly  intact. moves all 4 extremities w/o difficulty. Affect pleasant   ECG: AFIB/flutter 72 with RBBB Personally reviewed    ASSESSMENT & PLAN: 1. 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 Normal LVEF with significant RV failure - Volume status and functional capacity dramatically improved after stopping Vanclexta. - Will continue current regimen for now..  - Continue bumex to 3 bid. If dizzy cut back to 3 daily on those days  - Continue spiro to 50 daily.  - Continue prn parcenteses - Labs today - I had concern for cardiac cirrhosis but albumin remains 3.7 and recent CT without mention of cirrhotic changes. 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 Cytology from last paracentesis without malignant cells  2. Recurrent PAF - s/p multiple ablations and MAZE. Failed recent DC-CV - this has been ongoing issue for him.  - Amio 200 bid restarted 04/13/17. Had to be cut back to 200 daily due to tremors - Back in AF/AFL today but tolerating well. Will not change.  - Cardiologist at [facility] started digoxin 0.125 MWF. Level ok. Not sure that this is helping much however. - continue Eliquis  3. CLL - per Oncology - now s/p pelvic XRT  - Seems much improved after stopping Vanclexta  4. Aortic root aneurysm s/p repair and CABG x 1 - stable  Glori Bickers, MD  3:24 PM

## 2017-08-06 NOTE — Patient Instructions (Signed)
Your physician recommends that you schedule a follow-up appointment in: 2 months  

## 2017-08-15 ENCOUNTER — Encounter (HOSPITAL_COMMUNITY): Payer: Self-pay | Admitting: Internal Medicine

## 2017-08-16 ENCOUNTER — Other Ambulatory Visit (HOSPITAL_COMMUNITY): Payer: Self-pay | Admitting: *Deleted

## 2017-08-16 DIAGNOSIS — R188 Other ascites: Secondary | ICD-10-CM

## 2017-08-16 MED ORDER — AMIODARONE HCL 200 MG PO TABS: 200.0000 mg | ORAL_TABLET | Freq: Every day | ORAL | 3 refills | Status: DC

## 2017-08-16 MED ORDER — POLYSACCHARIDE IRON COMPLEX 150 MG PO CAPS: 150.0000 mg | ORAL_CAPSULE | Freq: Every day | ORAL | 0 refills | Status: AC

## 2017-08-17 ENCOUNTER — Ambulatory Visit (HOSPITAL_COMMUNITY)
Admission: RE | Admit: 2017-08-17 | Discharge: 2017-08-17 | Disposition: A | Discharge status: Home / Self Care | Payer: Medicare Other | Source: Ambulatory Visit | Attending: Internal Medicine | Admitting: Internal Medicine

## 2017-08-17 ENCOUNTER — Encounter (HOSPITAL_COMMUNITY): Payer: Self-pay | Admitting: Student

## 2017-08-17 DIAGNOSIS — R188 Other ascites: Secondary | ICD-10-CM | POA: Insufficient documentation

## 2017-08-17 HISTORY — PX: IR PARACENTESIS: IMG2679

## 2017-08-17 MED ORDER — LIDOCAINE HCL (PF) 2 % IJ SOLN
INTRAMUSCULAR | Status: AC
  Filled 2017-08-17: qty 20

## 2017-08-17 MED ORDER — LIDOCAINE HCL (PF) 1 % IJ SOLN
INTRAMUSCULAR | Status: DC | PRN
  Administered 2017-08-17: 5 mL

## 2017-08-17 NOTE — Procedures (Signed)
PROCEDURE SUMMARY:  Successful US guided paracentesis from right lateral abdomen.  Yielded 600 mL of bloody fluid.  No immediate complications.  Pt tolerated well.   Specimen was not sent for labs.  Docia Barrier PA-C 08/17/2017 10:53 AM

## 2017-08-29 ENCOUNTER — Encounter (HOSPITAL_COMMUNITY): Payer: Self-pay | Admitting: Internal Medicine

## 2017-08-30 ENCOUNTER — Other Ambulatory Visit (HOSPITAL_COMMUNITY): Payer: Self-pay | Admitting: *Deleted

## 2017-08-30 NOTE — Telephone Encounter (Signed)
Per Dr.Bensimhon stop Metoprolol and schedule paracentesis. Paracentesis scheduled for Monday at 2pm.  Pt aware and agreeable with plan.

## 2017-09-03 ENCOUNTER — Ambulatory Visit (HOSPITAL_COMMUNITY)
Admission: RE | Admit: 2017-09-03 | Discharge: 2017-09-03 | Disposition: A | Discharge status: Home / Self Care | Payer: Medicare Other | Source: Ambulatory Visit | Attending: Internal Medicine | Admitting: Internal Medicine

## 2017-09-03 ENCOUNTER — Encounter (HOSPITAL_COMMUNITY): Payer: Self-pay | Admitting: Radiology

## 2017-09-03 DIAGNOSIS — R188 Other ascites: Secondary | ICD-10-CM | POA: Diagnosis present

## 2017-09-03 HISTORY — PX: IR PARACENTESIS: IMG2679

## 2017-09-03 MED ORDER — LIDOCAINE HCL 2 % IJ SOLN
INTRAMUSCULAR | Status: DC | PRN
  Administered 2017-09-03: 10 mL

## 2017-09-03 MED ORDER — LIDOCAINE HCL (PF) 2 % IJ SOLN
INTRAMUSCULAR | Status: AC
  Filled 2017-09-03: qty 20

## 2017-09-03 NOTE — Procedures (Signed)
   IR  RLQ paracentesis  2.1 L bloody fluid  Tolerated well

## 2017-09-25 ENCOUNTER — Encounter (HOSPITAL_COMMUNITY): Payer: Self-pay | Admitting: Internal Medicine

## 2017-09-26 ENCOUNTER — Other Ambulatory Visit (HOSPITAL_COMMUNITY): Payer: Self-pay | Admitting: *Deleted

## 2017-09-26 DIAGNOSIS — R188 Other ascites: Secondary | ICD-10-CM

## 2017-09-26 NOTE — Progress Notes (Signed)
Patient sent message via mychart, requesting for paracentesis, next available.  Per Dr. Clayborne Dana standing orders patient has been scheduled for IR paracentesis on Monday July 1st @ 9:00AM. Patient informed via Mychart message.

## 2017-10-01 ENCOUNTER — Other Ambulatory Visit (HOSPITAL_COMMUNITY): Payer: Self-pay | Admitting: Internal Medicine

## 2017-10-01 ENCOUNTER — Ambulatory Visit (HOSPITAL_COMMUNITY)
Admission: RE | Admit: 2017-10-01 | Discharge: 2017-10-01 | Disposition: A | Discharge status: Home / Self Care | Payer: Medicare Other | Source: Ambulatory Visit | Attending: Internal Medicine | Admitting: Internal Medicine

## 2017-10-01 DIAGNOSIS — R188 Other ascites: Secondary | ICD-10-CM

## 2017-10-01 MED ORDER — LIDOCAINE HCL (PF) 2 % IJ SOLN
INTRAMUSCULAR | Status: AC
  Filled 2017-10-01: qty 20

## 2017-10-01 NOTE — Progress Notes (Signed)
Patient was scheduled for image-guided paracentesis this AM.  Limited abdominal ultrasound revealed little to no fluid that could be safely accessed with procedure. Images reviewed with Dr. Pascal Lux who agrees.  Informed patient that procedure will not occur today.  All questions answered and concerns addressed. Patient conveys understanding and agrees with plan.   Bea Graff Noble Bodie, PA-C 10/01/2017, 9:43 AM

## 2017-10-11 ENCOUNTER — Ambulatory Visit (HOSPITAL_COMMUNITY)
Admission: RE | Admit: 2017-10-11 | Discharge: 2017-10-11 | Disposition: A | Discharge status: Home / Self Care | Payer: Medicare Other | Source: Ambulatory Visit | Attending: Internal Medicine | Admitting: Internal Medicine

## 2017-10-11 ENCOUNTER — Other Ambulatory Visit (HOSPITAL_COMMUNITY): Payer: Self-pay | Admitting: Internal Medicine

## 2017-10-11 DIAGNOSIS — R188 Other ascites: Secondary | ICD-10-CM | POA: Diagnosis present

## 2017-10-11 MED ORDER — LIDOCAINE HCL (PF) 2 % IJ SOLN
INTRAMUSCULAR | Status: AC
  Filled 2017-10-11: qty 20

## 2017-10-11 NOTE — Progress Notes (Signed)
Patient ID: Antonio Carrillo, male   DOB: 1941/03/31, 77 y.o.   MRN: [id number]  Patient seen for paracentesis - limited US abdomen shows no fluid, no procedure performed.   Candiss Norse, PA-C  10/11/17 9:37 AM

## 2017-10-22 ENCOUNTER — Encounter (HOSPITAL_COMMUNITY): Payer: Self-pay | Admitting: Internal Medicine

## 2017-10-22 ENCOUNTER — Other Ambulatory Visit: Payer: Self-pay

## 2017-10-22 ENCOUNTER — Ambulatory Visit (HOSPITAL_COMMUNITY)
Admission: RE | Admit: 2017-10-22 | Discharge: 2017-10-22 | Disposition: A | Discharge status: Home / Self Care | Payer: Medicare Other | Source: Ambulatory Visit | Attending: Internal Medicine | Admitting: Internal Medicine

## 2017-10-22 VITALS — BP 136/64 | HR 62 | Wt 177.2 lb

## 2017-10-22 DIAGNOSIS — I5082 Biventricular heart failure: Secondary | ICD-10-CM | POA: Diagnosis not present

## 2017-10-22 DIAGNOSIS — E0590 Thyrotoxicosis, unspecified without thyrotoxic crisis or storm: Secondary | ICD-10-CM | POA: Diagnosis not present

## 2017-10-22 DIAGNOSIS — Z7901 Long term (current) use of anticoagulants: Secondary | ICD-10-CM | POA: Diagnosis not present

## 2017-10-22 DIAGNOSIS — I480 Paroxysmal atrial fibrillation: Secondary | ICD-10-CM

## 2017-10-22 DIAGNOSIS — I110 Hypertensive heart disease with heart failure: Secondary | ICD-10-CM | POA: Insufficient documentation

## 2017-10-22 DIAGNOSIS — Z951 Presence of aortocoronary bypass graft: Secondary | ICD-10-CM | POA: Insufficient documentation

## 2017-10-22 DIAGNOSIS — I5032 Chronic diastolic (congestive) heart failure: Secondary | ICD-10-CM | POA: Diagnosis not present

## 2017-10-22 DIAGNOSIS — R188 Other ascites: Secondary | ICD-10-CM

## 2017-10-22 DIAGNOSIS — I481 Persistent atrial fibrillation: Secondary | ICD-10-CM | POA: Diagnosis not present

## 2017-10-22 DIAGNOSIS — I50810 Right heart failure, unspecified: Secondary | ICD-10-CM

## 2017-10-22 DIAGNOSIS — C9190 Lymphoid leukemia, unspecified not having achieved remission: Secondary | ICD-10-CM

## 2017-10-22 DIAGNOSIS — C9110 Chronic lymphocytic leukemia of B-cell type not having achieved remission: Secondary | ICD-10-CM

## 2017-10-22 DIAGNOSIS — Q2543 Congenital aneurysm of aorta: Secondary | ICD-10-CM | POA: Diagnosis not present

## 2017-10-22 DIAGNOSIS — E785 Hyperlipidemia, unspecified: Secondary | ICD-10-CM | POA: Insufficient documentation

## 2017-10-22 DIAGNOSIS — Z79899 Other long term (current) drug therapy: Secondary | ICD-10-CM | POA: Insufficient documentation

## 2017-10-22 MED ORDER — AMIODARONE HCL 200 MG PO TABS: 200.0000 mg | ORAL_TABLET | Freq: Two times a day (BID) | ORAL | 6 refills | Status: DC

## 2017-10-22 NOTE — Progress Notes (Signed)
ADVANCED HF CLINIC NOTE   Primary Cardiologist: [name]  HPI:  Antonio Carrillo is a 77 y.o. Male with h/o AF s/p several ablations, aortic root aneurysm, s/p aortic root replacement with CABG 1, RV failure and CLL.   He was diagnosed with atrial fibrillation in 1977. He failed medical therapy with flecainide, rhythmol and multaq. Not tried on TIkosyn or sotalol due to prolonged QT.Was started on amio. Was on amio for many years but stopped in 2017 for unclear reasons. He underwent atrial flutter ablation in [place] late 1990s. Patient has had 3 previous atrial fibrillation ablations. In February 2016 the patient had aortic root replacement with CABG 1, Maze and left atrial appendage ligation at [facility] clinic.  More recently has been followed at [facility]. 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 Has been treated with Venetoclax, he has had fluid retention. [place] 5/18 with RA 3, v to 6, PA 27/8 (mean 15, PCWP 9 on right, 5 on left, PA sat 57%, CP/CI TD 6.4/3.1; F 4.4/2.1  He has had frequent adjustments of his diuretics in the setting of severe RV failure. Previously started on metolazone in addition to bumex. Had brisk response and felt it was too much so they stopped. Also had recurrent AF. Underwent DC-CV 03/07/17 at [facility]. Went back into AF at end of December.   We saw him in early January 2019 and arranged for paracentesis which he had over 7L off. We saw him again in late January and had repeat paracentesis with 6.4L off. No malignant cells in ascitic fluid.  Bumex increased to 3 bid and spiro increased to 50 daily. Also started on amio for PAF.   In the Spring 2019 underwent pelvic XRT x 10 for CLL and bulky lymphadenopathy. Also recently stopped Vanclexta.  Underwent paracentesis on 07/26/17 with 6L off. Recent paracentesis attempt 10/01/17 without significant ascites.   Here for routinely f/u. Continues with chronic nausea and anorexia. Has lost about 15 pounds. Eating < 1,000 cals per day. Otherwise feeling ok. No CP  or SOB. Still fatigues easily. Fluid well controlled on bumex 1.5 bid (down from 3 bid). Taking amio 100 bid. Had 1-2 brief periods of AF at rates 85-90 breaks with metoprolol. HR typically in 50-60s. Off digoxin. Still taking Eliquis. Mild skin bleeding.   Recent labs last week Cr 0.77 K 3.2 WBC 7.0 Hgb 12.3  Echo 05/18/17 reviewed personally LVEF normal RV markedly dilated and moderately HK. Moderate TR. IVC dilated   Recent CT scan 04/06/17 showed anasarca, ascites and small pleural effusions and significant retroperitoneal lymphadenopathy  cMRI at [facility] 4/18 1. Left ventricle is severely dilated (volume index 120 ml/m2) with  normal wall thickness. Left ventricular (LV) systolic function is  preserved (EF 81% with diastolic and systolic septal flattening consistent with right ventricular pressure/volume overload.   2. There is no late gadolinium enhancement (LGE) in the LV myocardium.  3. Right ventricle (RV) is severely dilated (volume index 181 ml/m2) with mildly reduced systolic function (EF 44%). There is no evidence of  ventricular interdependence.  4. There is severe biatrial enlargement. The interatrial septum bows towards the left atrium, suggestive of increased right atrial pressure.   5. Visualized valves (aortic, mitral, and tricuspid) appear pliable. There is regurgitation present of all three valves, although this was  not quantified on this study.   6. There is no pericardial effusion. Pericardium does not appear thickened. There is no LGE in the pericardium.   Labs 04/12/17  Sodium 141 K 4.1 Cr 0.84  Bili 1.5 Albumin 4.0   Past Medical  History:  Diagnosis Date  . Anticoagulants causing adverse effect in therapeutic use   . Aortic aneurysm [facility])    s/p surgery at [facility]  . HTN (hypertension)   . Hyperlipidemia   . Hyperthyroidism    resolved per pt  . 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 Persistent atrial fibrillation (HCC)    s/p ablations x3  . Testicular failure     Current Outpatient Medications  Medication Sig Dispense Refill  . amiodarone (PACERONE) 200 MG tablet Take 200 mg by mouth 2 (two) times daily.    Marland Kitchen apixaban (ELIQUIS) 5 MG TABS tablet TAKE ONE TABLET BY MOUTH 2 TIMES A DAY 180 tablet 3  . bumetanide (BUMEX) 2 MG tablet Take 1.5 tablets (3 mg total) by mouth 2 (two) times daily. 90 tablet 6  . digoxin (LANOXIN) 0.125 MG tablet Take by mouth daily.    . iron polysaccharides (NIFEREX) 150 MG capsule Take 1 capsule (150 mg total) by mouth daily. 30 capsule 0  . LORazepam (ATIVAN) 0.5 MG tablet Take 0.5 mg by mouth every 8 (eight) hours as needed for anxiety.    . ondansetron (ZOFRAN) 8 MG tablet Take 8 mg by mouth every 8 (eight) hours as needed for nausea or vomiting.    . potassium chloride SA (K-DUR,KLOR-CON) 20 MEQ tablet Take 40 mEq by mouth daily.    . prochlorperazine (COMPAZINE) 10 MG tablet Take 10 mg by mouth every 6 (six) hours as needed for nausea or vomiting.    . sertraline (ZOLOFT) 100 MG tablet Take 100 mg by mouth 2 (two) times daily.    Marland Kitchen spironolactone (ALDACTONE) 25 MG tablet Take 2 tablets (50 mg total) by mouth daily. 60 tablet 6  . temazepam (RESTORIL) 30 MG capsule Take 30 mg by mouth at bedtime as needed for sleep.     No current facility-administered medications for this encounter.     No Known Allergies    Social History   Socioeconomic History  . Marital status: Married    Spouse name: Not on file  . Number of children: Not on file  . Years of education: Not on file  . Highest education level: Not on file  Occupational History  . 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 Financial resource strain: Not on file  . Food insecurity:    Worry: Not on file    Inability: Not on file  . Transportation needs:    Medical: Not on file    Non-medical: Not on file  Tobacco Use  . Smoking status: Never Smoker  . Smokeless tobacco: Never Used  Substance and Sexual Activity  . Alcohol use: No  . Drug use: No  . Sexual activity: Yes    Partners: Female  Lifestyle  . Physical activity:    Days per week: Not on file    Minutes per session: Not on file  . Stress: Not on file  Relationships  . Social connections:    Talks on phone: Not on file    Gets together: Not on file    Attends religious service: Not on file    Active member of club or organization: Not on file    Attends meetings of clubs or organizations: Not on file    Relationship status: Not on file  . Intimate partner violence:    Fear of current  or ex partner: Not on file    Emotionally abused: Not on file    Physically abused: Not on file    Forced sexual activity: Not on file  Other Topics Concern  . Not on file  Social History Narrative   Lives in [place].      Family History  Problem Relation Age of Onset  . Arrhythmia Mother     Vitals:   10/22/17 0949  BP: 136/64  Pulse: 62  SpO2: 100%  Weight: 177 lb 4 oz (80.4 kg)   PHYSICAL EXAM: General:  Thin. Weak appearing. No resp difficulty HEENT: normal Neck: supple. JVP 7-8 Carotids 2+ bilat; no bruits. No lymphadenopathy or thryomegaly appreciated. Cor: PMI nondisplaced. Regular rate & rhythm. No rubs, gallops or murmurs. Lungs: clear Abdomen: soft, nontender, mildly distended. No hepatosplenomegaly. No bruits or masses. Good bowel sounds. Extremities: no cyanosis, clubbing, rash, edema Neuro: alert & orientedx3, cranial nerves grossly intact. moves all 4 extremities w/o difficulty. Affect pleasant   ECG: SB 58 with RBBB and 1 AVB (281ms) Personally  reviewed   ASSESSMENT & PLAN: 1. Chronic biventricular HF with diastolic dysfunction - [facility]MRI 4/18 at [facility] with normal LV function. RV severely dilated and mild RV dysfunction - Echo 2/19. Normal LVEF with significant RV failure - Volume status and functional capacity improved afer stopping Vanclexta. - Etiology of RV dysfunction remains unclear.  - Will repeat echo if still with evidence of RV failure will proceed with VQ study + RHC to evalaute - Continue bumex to 1.5 bid. (cut back from 3 bid) - Continue spiro to 50 daily.  - Recent labs ok - Continue prn parcenteses - I had concern for cardiac cirrhosis but albumin remains 3.7 and recent CT without mention of cirrhotic changes. 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 Cytology from last paracentesis without malignant cells  2. Recurrent PAF - s/p multiple ablations and MAZE. Failed recent DC-CV - this has been ongoing issue for him.  - Amio 200 bid restarted 04/13/17. Had to be cut back to 100 bid due to tremors - Remains in NSR today.  - Now off digoxin - continue Eliquis. Tolerating well   3. CLL - per Oncology - now s/p pelvic XRT  - Seems much improved after stopping Vanclexta  4. Aortic root aneurysm s/p repair and CABG x 1 - stable  Glori Bickers, MD  10:07 AM

## 2017-10-22 NOTE — Patient Instructions (Signed)
Your physician has requested that you have an echocardiogram. Echocardiography is a painless test that uses sound waves to create images of your heart. It provides your doctor with information about the size and shape of your heart and how well your heart's chambers and valves are working. This procedure takes approximately one hour. There are no restrictions for this procedure.  Your physician recommends that you schedule a follow-up appointment in: 3 months  

## 2017-10-23 ENCOUNTER — Encounter (HOSPITAL_COMMUNITY): Payer: Self-pay | Admitting: Internal Medicine

## 2017-10-31 ENCOUNTER — Other Ambulatory Visit (HOSPITAL_COMMUNITY): Payer: Medicare Other

## 2017-10-31 ENCOUNTER — Ambulatory Visit (HOSPITAL_COMMUNITY)
Admission: RE | Admit: 2017-10-31 | Discharge: 2017-10-31 | Disposition: A | Discharge status: Home / Self Care | Payer: Medicare Other | Source: Ambulatory Visit | Attending: Internal Medicine | Admitting: Internal Medicine

## 2017-10-31 DIAGNOSIS — I5032 Chronic diastolic (congestive) heart failure: Secondary | ICD-10-CM | POA: Diagnosis present

## 2017-10-31 DIAGNOSIS — I083 Combined rheumatic disorders of mitral, aortic and tricuspid valves: Secondary | ICD-10-CM | POA: Diagnosis not present

## 2017-10-31 NOTE — Progress Notes (Signed)
  Echocardiogram 2D Echocardiogram has been performed.  Antonio Carrillo 10/31/2017, 3:39 PM

## 2017-11-13 ENCOUNTER — Telehealth (HOSPITAL_COMMUNITY): Payer: Self-pay

## 2017-11-13 NOTE — Telephone Encounter (Signed)
Result Notes for ECHOCARDIOGRAM COMPLETE   Notes recorded by Effie Berkshire, RN on 11/13/2017 at 2:15 PM EDT Results left on confidential VM with call back number, reminded of upcoming appt in october ------  Notes recorded by Jolaine Artist, MD on 11/02/2017 at 11:59 AM EDT Echo images difficult but I think LV is normal (EF 50-55%) and RV is down. No change.

## 2017-12-05 ENCOUNTER — Telehealth (HOSPITAL_COMMUNITY): Payer: Self-pay | Admitting: Cardiology

## 2017-12-05 DIAGNOSIS — R188 Other ascites: Secondary | ICD-10-CM

## 2017-12-05 NOTE — Telephone Encounter (Signed)
Patient called to request a paracentesis  C/o increased abdominal swelling   Per Dr Haroldine Laws last [place] 10/22/17 -continue prn paracentesis

## 2017-12-07 ENCOUNTER — Ambulatory Visit (HOSPITAL_COMMUNITY): Payer: Medicare Other

## 2017-12-11 ENCOUNTER — Other Ambulatory Visit (HOSPITAL_COMMUNITY): Payer: Self-pay | Admitting: Internal Medicine

## 2017-12-11 ENCOUNTER — Ambulatory Visit (HOSPITAL_COMMUNITY)
Admission: RE | Admit: 2017-12-11 | Discharge: 2017-12-11 | Disposition: A | Discharge status: Home / Self Care | Payer: Medicare Other | Source: Ambulatory Visit | Attending: Internal Medicine | Admitting: Internal Medicine

## 2017-12-11 DIAGNOSIS — R188 Other ascites: Secondary | ICD-10-CM | POA: Diagnosis present

## 2017-12-11 DIAGNOSIS — Z8719 Personal history of other diseases of the digestive system: Secondary | ICD-10-CM | POA: Insufficient documentation

## 2017-12-11 MED ORDER — LIDOCAINE HCL (PF) 2 % IJ SOLN
INTRAMUSCULAR | Status: AC
  Filled 2017-12-11: qty 20

## 2017-12-11 NOTE — Progress Notes (Signed)
  I was present during limited US of the abdomen for ascites for possible paracentesis.  There is no fluid.  No paracentesis needed today.  Zayvon Alicea S Latayna Ritchie PA-C 12/11/2017 10:07 AM

## 2017-12-17 ENCOUNTER — Other Ambulatory Visit (HOSPITAL_COMMUNITY): Payer: Self-pay | Admitting: *Deleted

## 2017-12-17 MED ORDER — APIXABAN 5 MG PO TABS: ORAL_TABLET | ORAL | 3 refills | Status: DC

## 2018-01-11 ENCOUNTER — Telehealth (HOSPITAL_COMMUNITY): Payer: Self-pay

## 2018-01-11 DIAGNOSIS — R188 Other ascites: Secondary | ICD-10-CM

## 2018-01-11 NOTE — Telephone Encounter (Signed)
Pt has chronic ascites and gets periodic [place] with paracentesis. OK to order for ASAP.    Legrand Como [address] [place], PA-C 01/11/2018 11:25 AM

## 2018-01-11 NOTE — Telephone Encounter (Signed)
Order has been placed. Was unable to leave message for pt due to his mailbox is not set up.

## 2018-01-11 NOTE — Telephone Encounter (Signed)
Pt called stating that he has abdominal fluid. Pt states that he has gained 7 lbs in the past 10 days. Pt wants an appt to have it drained. I advised him that he has an appt on 10/21 with Dan and pt would like to have something done before then. Please advise.

## 2018-01-21 ENCOUNTER — Other Ambulatory Visit: Payer: Self-pay

## 2018-01-21 ENCOUNTER — Ambulatory Visit (HOSPITAL_COMMUNITY)
Admission: RE | Admit: 2018-01-21 | Discharge: 2018-01-21 | Disposition: A | Discharge status: Home / Self Care | Payer: Medicare Other | Source: Ambulatory Visit | Attending: Internal Medicine | Admitting: Internal Medicine

## 2018-01-21 ENCOUNTER — Encounter (HOSPITAL_COMMUNITY): Payer: Self-pay | Admitting: Internal Medicine

## 2018-01-21 VITALS — BP 128/70 | HR 53 | Wt 183.4 lb

## 2018-01-21 DIAGNOSIS — C9110 Chronic lymphocytic leukemia of B-cell type not having achieved remission: Secondary | ICD-10-CM | POA: Insufficient documentation

## 2018-01-21 DIAGNOSIS — Z951 Presence of aortocoronary bypass graft: Secondary | ICD-10-CM | POA: Diagnosis not present

## 2018-01-21 DIAGNOSIS — I50810 Right heart failure, unspecified: Secondary | ICD-10-CM

## 2018-01-21 DIAGNOSIS — Z79899 Other long term (current) drug therapy: Secondary | ICD-10-CM | POA: Insufficient documentation

## 2018-01-21 DIAGNOSIS — I5082 Biventricular heart failure: Secondary | ICD-10-CM | POA: Insufficient documentation

## 2018-01-21 DIAGNOSIS — R188 Other ascites: Secondary | ICD-10-CM | POA: Insufficient documentation

## 2018-01-21 DIAGNOSIS — I4892 Unspecified atrial flutter: Secondary | ICD-10-CM | POA: Diagnosis not present

## 2018-01-21 DIAGNOSIS — I480 Paroxysmal atrial fibrillation: Secondary | ICD-10-CM | POA: Insufficient documentation

## 2018-01-21 DIAGNOSIS — Z7901 Long term (current) use of anticoagulants: Secondary | ICD-10-CM | POA: Insufficient documentation

## 2018-01-21 DIAGNOSIS — Z8249 Family history of ischemic heart disease and other diseases of the circulatory system: Secondary | ICD-10-CM | POA: Insufficient documentation

## 2018-01-21 DIAGNOSIS — I5032 Chronic diastolic (congestive) heart failure: Secondary | ICD-10-CM

## 2018-01-21 DIAGNOSIS — I110 Hypertensive heart disease with heart failure: Secondary | ICD-10-CM | POA: Insufficient documentation

## 2018-01-21 DIAGNOSIS — E785 Hyperlipidemia, unspecified: Secondary | ICD-10-CM | POA: Insufficient documentation

## 2018-01-21 DIAGNOSIS — I4891 Unspecified atrial fibrillation: Secondary | ICD-10-CM

## 2018-01-21 NOTE — Progress Notes (Signed)
Advanced Heart Failure Clinic Note   Primary Cardiologist: Crenshaw  HPI:  Antonio Carrillo is a 77 y.o. male  with h/o AF s/p several ablations, aortic root aneurysm, s/p aortic root replacement with CABG 1, RV failure and CLL.   He was diagnosed with atrial fibrillation in 1977. He failed medical therapy with flecainide, rhythmol and multaq. Not tried on TIkosyn or sotalol due to prolonged QT.Was started on amio. Was on amio for many years but stopped in 2017 for unclear reasons. He underwent atrial flutter ablation in [place] late 1990s. Patient has had 3 previous atrial fibrillation ablations. In February 2016 the patient had aortic root replacement with CABG 1, Maze and left atrial appendage ligation at [facility] clinic.  More recently has been followed at [facility]. 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 Has been treated with Venetoclax, he has had fluid retention. [place] 5/18 with RA 3, v to 6, PA 27/8 (mean 15, PCWP 9 on right, 5 on left, PA sat 57%, CP/CI TD 6.4/3.1; F 4.4/2.1  He has had frequent adjustments of his diuretics in the setting of severe RV failure. Previously started on metolazone in addition to bumex. Had brisk response and felt it was too much so they stopped. Also had recurrent AF. Underwent DC-CV 03/07/17 at [facility]. Went back into AF at end of December.   We saw him in early January 2019 and arranged for paracentesis which he had over 7L off. We saw him again in late January and had repeat paracentesis with 6.4L off. No malignant cells in ascitic fluid.  Bumex increased to 3 bid and spiro increased to 50 daily. Also started on amio for PAF.   In the Spring 2019 underwent pelvic XRT x 10 for CLL and bulky lymphadenopathy. Also recently stopped Vanclexta.  Underwent paracentesis on 07/26/17 with 6L off. Recent paracentesis attempt 10/01/17 without significant ascites.   Here for routine follow up. Recently called and asked for paracentesis. He is due. Drinking > 48 oz a day, feels like he has been  drinking too much. Has been very thirsty. Denies CP or SOB. Energy level is OK. HR remains in 50-60s. Mild skin bleeding on Eliquis, but no other overt bleeding. Hasn't felt like he has had any breakthrough Afib. BP elevated on arrival but he states this is an outlier for him. Usually runs in 120-130s. Recheck stable after sitting. Despite increased abdominal distention, he feels OK otherwise. Weight up about 5 lbs at home.   Echo 10/2017 LVEF read as 35-40%, but over-read by Dr. Haroldine Laws to be ~50-55%. RV down  Echo 05/18/17 LVEF normal RV markedly dilated and moderately HK. Moderate TR. IVC dilated   Recent CT scan 04/06/17 showed anasarca, ascites and small pleural effusions and significant retroperitoneal lymphadenopathy  cMRI at [facility] 4/18 1. Left ventricle is severely dilated (volume index 120 ml/m2) with  normal wall thickness. Left ventricular (LV) systolic function is  preserved (EF 13% with diastolic and systolic septal flattening consistent with right ventricular pressure/volume overload.   2. There is no late gadolinium enhancement (LGE) in the LV myocardium.  3. Right ventricle (RV) is severely dilated (volume index 181 ml/m2) with mildly reduced systolic function (EF 08%). There is no evidence of  ventricular interdependence.  4. There is severe biatrial enlargement. The interatrial septum bows towards the left atrium, suggestive of increased right atrial pressure.   5. Visualized valves (aortic, mitral, and tricuspid) appear pliable. There is regurgitation present of all three valves, although this was  not quantified  on this study.   6. There is no pericardial effusion. Pericardium does not  appear thickened. There is no LGE in the pericardium.   Labs 04/12/17  Sodium 141 K 4.1 Cr 0.84 Bili 1.5 Albumin 4.0   Past Medical History:  Diagnosis Date  . Anticoagulants causing adverse effect in therapeutic use   . Aortic aneurysm [facility])    s/p surgery at [facility]  . HTN (hypertension)   . Hyperlipidemia   . Hyperthyroidism    resolved per pt  . 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 Persistent atrial fibrillation    s/p ablations x3  . Testicular failure     Current Outpatient Medications  Medication Sig Dispense Refill  . amiodarone (PACERONE) 200 MG tablet Take 1 tablet (200 mg total) by mouth 2 (two) times daily. 60 tablet 6  . apixaban (ELIQUIS) 5 MG TABS tablet TAKE ONE TABLET BY MOUTH 2 TIMES A DAY 180 tablet 3  . bumetanide (BUMEX) 2 MG tablet Take 1.5 tablets (3 mg total) by mouth 2 (two) times daily. 90 tablet 6  . digoxin (LANOXIN) 0.125 MG tablet Take by mouth daily.    . iron polysaccharides (NIFEREX) 150 MG capsule Take 1 capsule (150 mg total) by mouth daily. 30 capsule 0  . LORazepam (ATIVAN) 0.5 MG tablet Take 0.5 mg by mouth every 8 (eight) hours as needed for anxiety.    . ondansetron (ZOFRAN) 8 MG tablet Take 8 mg by mouth every 8 (eight) hours as needed for nausea or vomiting.    . potassium chloride SA (K-DUR,KLOR-CON) 20 MEQ tablet Take 40 mEq by mouth daily.    . prochlorperazine (COMPAZINE) 10 MG tablet Take 10 mg by mouth every 6 (six) hours as needed for nausea or vomiting.    . sertraline (ZOLOFT) 100 MG tablet Take 100 mg by mouth 2 (two) times daily.    Marland Kitchen spironolactone (ALDACTONE) 25 MG tablet Take 2 tablets (50 mg total) by mouth daily. 60 tablet 6   No current facility-administered medications for this encounter.     No Known Allergies    Social History   Socioeconomic History  . Marital status: Married    Spouse name: Not on file  . Number of children: Not on  file  . Years of education: Not on file  . Highest education level: Not on file  Occupational History  . 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 Financial resource strain: Not on file  . Food insecurity:    Worry: Not on file    Inability: Not on file  . Transportation needs:    Medical: Not on file    Non-medical: Not on file  Tobacco Use  . Smoking status: Never Smoker  . Smokeless tobacco: Never Used  Substance and Sexual Activity  . Alcohol use: No  . Drug use: No  . Sexual activity: Yes    Partners: Female  Lifestyle  . Physical activity:    Days per week: Not on file    Minutes per session: Not on file  . Stress: Not on file  Relationships  . Social connections:    Talks on phone: Not on file    Gets together: Not on file    Attends religious service: Not on file    Active member of club or organization: Not on file    Attends meetings of clubs or organizations: Not on file  Relationship status: Not on file  . Intimate partner violence:    Fear of current or ex partner: Not on file    Emotionally abused: Not on file    Physically abused: Not on file    Forced sexual activity: Not on file  Other Topics Concern  . Not on file  Social History Narrative   Lives in [place].      Family History  Problem Relation Age of Onset  . Arrhythmia Mother     Vitals:   01/21/18 0948  BP: (!) 165/60  Pulse: (!) 53  SpO2: 98%  Weight: 83.2 kg (183 lb 6.4 oz)   PHYSICAL EXAM: General:Elderly appearing.  No resp difficulty. HEENT: Normal Neck: Supple. JVP ~7-8 cm Carotids 2+ bilat; no bruits. No thyromegaly or nodule noted. Cor: PMI nondisplaced. RRR, No M/G/R noted Lungs: CTAB, normal effort. Abdomen: Distended, non-tender. No HSM appreciated. No bruits or masses. +BS  Extremities: No cyanosis, clubbing, or rash. R and LLE no edema.  Neuro: Alert & orientedx3, cranial nerves grossly intact. moves all 4  extremities w/o difficulty. Affect pleasant   ASSESSMENT & PLAN: 1. Chronic biventricular HF with diastolic dysfunction - cMRI 4/18 at [facility] with normal LV function. RV severely dilated and mild RV dysfunction - Echo 2/19. Normal LVEF with significant RV failure - Echo 10/2017 LVEF read as 35-40%, but over-read by Dr. Haroldine Laws to be ~50-55%. RV down. Stable per Dr. Haroldine Laws - NYHA II symptoms - Volume status mildly elevated on exam in setting of ascites.   - Continue bumex 1.5 mg BID. Had labs in 12/2017 at [facility]. Cr stable. BUN 36. Can take 2 mg BID as needed.  - Continue spiro 50 daily.  - Recent labs ok - Paracentesis scheduled for 01/22/18 at 1300. 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 Cytology from last paracentesis without malignant cells  2. Recurrent PAF - S/p multiple ablations and MAZE. Failed recent DC-CV - This has been ongoing issue for him.  - Continue amio 100 mg BID (Intolerant to higher doses with tremors)  - Regular on exam today.  - Now off digoxin - Continue Eliquis. Tolerating well   3. CLL - Per Oncology - Now s/p pelvic XRT  - Seems much improved after stopping Vanclexta  4. Aortic root aneurysm s/p repair and CABG x 1 - Stable.   Doing well overall, but with recurrent ascites. No indication for RHC at this time. Plan paracentesis tomorrow. Can take extra bumex (2 mg BID as needed). Recent labwork stable. RTC 3-4 months. Sooner with symptoms.   Shirley Friar, PA-C  9:54 AM  Greater than 50% of the 30 minute visit was spent in counseling/coordination of care regarding disease state education, salt/fluid restriction, sliding scale diuretics, and medication compliance.

## 2018-01-21 NOTE — Patient Instructions (Signed)
Paracentesis scheduled for tomorrow 01/22/18 at 1 pm, please arrive to [facility] Radiology Department at 12:45.  Your physician recommends that you schedule a follow-up appointment in: 3-4

## 2018-01-22 ENCOUNTER — Other Ambulatory Visit (HOSPITAL_COMMUNITY): Payer: Self-pay | Admitting: Internal Medicine

## 2018-01-22 ENCOUNTER — Ambulatory Visit (HOSPITAL_COMMUNITY)
Admission: RE | Admit: 2018-01-22 | Discharge: 2018-01-22 | Disposition: A | Discharge status: Home / Self Care | Payer: Medicare Other | Source: Ambulatory Visit | Attending: Internal Medicine | Admitting: Internal Medicine

## 2018-01-22 DIAGNOSIS — R188 Other ascites: Secondary | ICD-10-CM

## 2018-01-22 NOTE — Progress Notes (Signed)
Patient ID: Antonio Carrillo, male   DOB: Aug 28, 1940, 77 y.o.   MRN: [id number]   Scheduled for therapeutic paracentesis today  Limited Abd US performed No fluid noted all 4 quadrants  No paracentesis performed

## 2018-03-31 ENCOUNTER — Encounter (HOSPITAL_COMMUNITY): Payer: Self-pay

## 2018-04-01 ENCOUNTER — Telehealth (HOSPITAL_COMMUNITY): Payer: Self-pay

## 2018-04-01 NOTE — Telephone Encounter (Signed)
How is he taking Bumex? Last reported was 3 mg BID (1 and one half tab of 2 mg twice daily).   His weight at that time was in the 180s, what is his weight now? He can take 4 mg of Bumex (2 tabs) twice daily for 3 days to see if this improves his fluid.  Needs labwork, keep appt for 1/24 for now with no SOB.   Antonio Carrillo [address] [place], PA-C 04/01/2018 3:31 PM

## 2018-04-01 NOTE — Telephone Encounter (Signed)
Attempted to call pt back twice. VM not set up for me to leave a message.

## 2018-04-01 NOTE — Telephone Encounter (Signed)
Pt called to report that he has gained 30 lbs in an X amount of time. Pt could not give me time frame. Pt states that he is also experiencing edema in his legs, ankles, and abdomen, some fatigue, no SOB. Pt is currently taking spiro 25 mg (Take 2 tablets (50 mg total) by mouth daily). Please advise.

## 2018-04-02 ENCOUNTER — Other Ambulatory Visit (HOSPITAL_COMMUNITY): Payer: Self-pay | Admitting: Internal Medicine

## 2018-04-02 ENCOUNTER — Encounter (HOSPITAL_COMMUNITY): Payer: Self-pay

## 2018-04-04 ENCOUNTER — Other Ambulatory Visit: Payer: Self-pay | Admitting: Student

## 2018-04-04 ENCOUNTER — Telehealth (HOSPITAL_COMMUNITY): Payer: Self-pay

## 2018-04-04 ENCOUNTER — Encounter (HOSPITAL_COMMUNITY): Payer: Self-pay

## 2018-04-04 DIAGNOSIS — I5032 Chronic diastolic (congestive) heart failure: Secondary | ICD-10-CM

## 2018-04-04 NOTE — Telephone Encounter (Signed)
OK to schedule

## 2018-04-04 NOTE — Telephone Encounter (Signed)
Notified pt and he stated that he is taking bumex. Per Jonni Sanger, pt can take 4 mg bumex twice daily for 3 days to improve his edema. Pt would like a paracenteses. Would you like for me to schedule that? Please advise.

## 2018-04-04 NOTE — Telephone Encounter (Signed)
Done

## 2018-04-04 NOTE — Telephone Encounter (Signed)
Opened in error

## 2018-04-08 ENCOUNTER — Ambulatory Visit (HOSPITAL_COMMUNITY)
Admission: RE | Admit: 2018-04-08 | Discharge: 2018-04-08 | Disposition: A | Discharge status: Home / Self Care | Payer: Medicare Other | Source: Ambulatory Visit | Attending: Student | Admitting: Student

## 2018-04-08 ENCOUNTER — Encounter: Payer: Self-pay | Admitting: Physician Assistant

## 2018-04-08 DIAGNOSIS — R188 Other ascites: Secondary | ICD-10-CM | POA: Insufficient documentation

## 2018-04-08 DIAGNOSIS — I5032 Chronic diastolic (congestive) heart failure: Secondary | ICD-10-CM | POA: Diagnosis not present

## 2018-04-08 HISTORY — PX: IR PARACENTESIS: IMG2679

## 2018-04-08 MED ORDER — LIDOCAINE HCL (PF) 1 % IJ SOLN
INTRAMUSCULAR | Status: AC | PRN
  Administered 2018-04-08: 10 mL

## 2018-04-08 NOTE — Procedures (Signed)
PROCEDURE SUMMARY:  Successful image-guided paracentesis from the right lower abdomen.  Yielded 3.0 liters of amber fluid.  No immediate complications.  EBL: zero Patient tolerated well.   Specimen was sent for labs.  Please see imaging section of Epic for full dictation.  Joaquim Nam PA-C 04/08/2018 2:54 PM

## 2018-04-10 ENCOUNTER — Telehealth (HOSPITAL_COMMUNITY): Payer: Self-pay

## 2018-04-10 ENCOUNTER — Other Ambulatory Visit (HOSPITAL_COMMUNITY): Payer: Self-pay | Admitting: Internal Medicine

## 2018-04-10 ENCOUNTER — Telehealth (HOSPITAL_COMMUNITY): Payer: Self-pay | Admitting: Vascular Surgery

## 2018-04-10 NOTE — Telephone Encounter (Signed)
Pt called stating that he ran into Dr. Haroldine Laws in the hall on Monday and per DB, pt is suppose to call if swelling is not better. Pt states that he still has edema in his feet, legs, and abdomen. Reports no SOB or weight gain. Please advise.

## 2018-04-10 NOTE — Telephone Encounter (Signed)
Left pt message to reschedule appt 1/24 w/ db, db will not be in office on that day, rescheduled appt 05/28/18 @ 10:40 asked pt to call back to confirm

## 2018-04-10 NOTE — Telephone Encounter (Signed)
Pt notified. Verbalizes understanding. Lab appt made. 

## 2018-04-10 NOTE — Telephone Encounter (Signed)
He can take 4 mg bumex BID for 3 days and see if that helps. He needs a BMET end of this week or early next week. Thanks

## 2018-04-11 ENCOUNTER — Telehealth (HOSPITAL_COMMUNITY): Payer: Self-pay

## 2018-04-11 NOTE — Telephone Encounter (Signed)
Pre-op clearance faxed to Ortho /Dr. Norwood Levo for olecranon bursitis.

## 2018-04-15 ENCOUNTER — Ambulatory Visit (HOSPITAL_COMMUNITY)
Admission: RE | Admit: 2018-04-15 | Discharge: 2018-04-15 | Disposition: A | Discharge status: Home / Self Care | Payer: Medicare Other | Source: Ambulatory Visit | Attending: Internal Medicine | Admitting: Internal Medicine

## 2018-04-15 DIAGNOSIS — I4891 Unspecified atrial fibrillation: Secondary | ICD-10-CM | POA: Diagnosis not present

## 2018-04-15 LAB — BASIC METABOLIC PANEL
ANION GAP: 8 (ref 5–15)
BUN: 36 mg/dL — ABNORMAL HIGH (ref 8–23)
CALCIUM: 8.6 mg/dL — AB (ref 8.9–10.3)
CHLORIDE: 97 mmol/L — AB (ref 98–111)
CO2: 29 mmol/L (ref 22–32)
Creatinine, Ser: 1.25 mg/dL — ABNORMAL HIGH (ref 0.61–1.24)
GFR calc non Af Amer: 54 mL/min — ABNORMAL LOW (ref >60)
GLUCOSE: 177 mg/dL — AB (ref 70–99)
Potassium: 4.4 mmol/L (ref 3.5–5.1)
Sodium: 134 mmol/L — ABNORMAL LOW (ref 135–145)

## 2018-04-17 ENCOUNTER — Encounter (HOSPITAL_COMMUNITY): Payer: Self-pay

## 2018-04-21 ENCOUNTER — Encounter (HOSPITAL_COMMUNITY): Payer: Self-pay

## 2018-04-21 ENCOUNTER — Telehealth: Payer: Self-pay | Admitting: Physician Assistant

## 2018-04-21 DIAGNOSIS — R188 Other ascites: Secondary | ICD-10-CM

## 2018-04-21 NOTE — Telephone Encounter (Signed)
Patient paged the after hour answering service says he gained 40 lbs recently. He recently increase bumex, however Cr trended up to 1.25 and Bumex was reduced back to previous dose. I recommend him to increase Bumex to 4mg  BID and increase KCl to 40mg  AM and 20mg  PM. I have sent staff message to heart failure scheduler to arrange close outpatient followup. I did offer general cardiology followup, however patient says it has to be heart failure service. He will continue on higher dose of diuretic until he is seen  Signed, Almyra Deforest PA Pager: [phone]

## 2018-04-21 NOTE — Telephone Encounter (Signed)
Will need paracentesis arranged.

## 2018-04-22 ENCOUNTER — Telehealth (HOSPITAL_COMMUNITY): Payer: Self-pay

## 2018-04-22 NOTE — Telephone Encounter (Signed)
Pt notified Verbalizes understanding 

## 2018-04-22 NOTE — Telephone Encounter (Signed)
Order placed for paracentesis

## 2018-04-22 NOTE — Addendum Note (Signed)
Addended by: Kerry Dory on: 04/22/2018 02:02 PM   Modules accepted: Orders

## 2018-04-22 NOTE — Telephone Encounter (Addendum)
Pt called to report severe edema in both legs, fatigue, no SOB, and has not weighed but pt states he has put on weight. Pt states that he called the CHF clinic yesterday and someone called him back. Pt also states that the PA who called him back increased his bumex 2 mg from 1.5 tabs twice a day to 2 tabs twice a day. Please advise.

## 2018-04-22 NOTE — Telephone Encounter (Signed)
Per note from Dr. Haroldine Laws yesterday, need to schedule repeat paracentesis. Keep appt for later this week. Would not change diuretics again as they were just changed yesterday. Can continue 2 tabs BID until visit later this week.     Legrand Como [address]" [place], PA-C 04/22/2018 11:01 AM

## 2018-04-24 ENCOUNTER — Other Ambulatory Visit (HOSPITAL_COMMUNITY): Payer: Self-pay

## 2018-04-24 ENCOUNTER — Ambulatory Visit (HOSPITAL_COMMUNITY)
Admission: RE | Admit: 2018-04-24 | Discharge: 2018-04-24 | Disposition: A | Discharge status: Home / Self Care | Payer: Medicare Other | Source: Ambulatory Visit | Attending: Internal Medicine | Admitting: Internal Medicine

## 2018-04-24 ENCOUNTER — Encounter (HOSPITAL_COMMUNITY): Payer: Self-pay | Admitting: Radiology

## 2018-04-24 DIAGNOSIS — R188 Other ascites: Secondary | ICD-10-CM | POA: Insufficient documentation

## 2018-04-24 HISTORY — PX: IR PARACENTESIS: IMG2679

## 2018-04-24 MED ORDER — LIDOCAINE HCL (PF) 1 % IJ SOLN
INTRAMUSCULAR | Status: DC | PRN
  Administered 2018-04-24: 10 mL

## 2018-04-24 MED ORDER — LIDOCAINE HCL 1 % IJ SOLN
INTRAMUSCULAR | Status: AC
  Filled 2018-04-24: qty 20

## 2018-04-24 NOTE — Progress Notes (Signed)
Advanced Heart Failure Clinic Note   Primary Cardiologist: Crenshaw  HPI:  Antonio Carrillo is a 78 y.o. male  with h/o AF s/p several ablations, aortic root aneurysm, s/p aortic root replacement with CABG 1, RV failure and CLL.   He was diagnosed with atrial fibrillation in 1977. He failed medical therapy with flecainide, rhythmol and multaq. Not tried on TIkosyn or sotalol due to prolonged QT.Was started on amio. Was on amio for many years but stopped in 2017 for unclear reasons. He underwent atrial flutter ablation in [place] late 1990s. Patient has had 3 previous atrial fibrillation ablations. In February 2016 the patient had aortic root replacement with CABG 1, Maze and left atrial appendage ligation at [facility] clinic.  More recently has been followed at [facility]. 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 Has been treated with Venetoclax, he has had fluid retention. [place] 5/18 with RA 3, v to 6, PA 27/8 (mean 15, PCWP 9 on right, 5 on left, PA sat 57%, CP/CI TD 6.4/3.1; F 4.4/2.1  He has had frequent adjustments of his diuretics in the setting of severe RV failure. Previously started on metolazone in addition to bumex. Had brisk response and felt it was too much so they stopped. Also had recurrent AF. Underwent DC-CV 03/07/17 at [facility]. Went back into AF at end of December.   We saw him in early January 2019 and arranged for paracentesis which he had over 7L off. We saw him again in late January and had repeat paracentesis with 6.4L off. No malignant cells in ascitic fluid.  Bumex increased to 3 bid and spiro increased to 50 daily. Also started on amio for PAF.   In the Spring 2019 underwent pelvic XRT x 10 for CLL and bulky lymphadenopathy. Also recently stopped Vanclexta.  He suffers from significant ascites requiring paracentesis with increase frequency. He had 3.0 L off 04/08/2018 and 4.1 L off 04/24/2018  He presents today for follow up. He presented to office yesterday with complaints of edema and SOB,  requesting paracentesis. We were able to schedule him a paracentesis for yesterday afternoon with 4.1 L off. He is up 40 lbs from visit in October, he says home weight up 40-50 lbs at home since Christmas. He thinks he may have been drinking too much. His foot was caught over the weekend and seen in ED at [facility] for sutures. Since it has been draining clear fluid in setting of volume overload. He feels terrible, with poor appetite. States he has had "maybe a bagel" in the past 5 days. Denies CP or palpitations. He is SOB with any exertion. He denies orthopnea or PND.   Echo 10/2017 LVEF read as 35-40%, but over-read by Dr. Haroldine Laws to be ~50-55%. RV down  Echo 05/18/17 LVEF normal RV markedly dilated and moderately HK. Moderate TR. IVC dilated   Recent CT scan 04/06/17 showed anasarca, ascites and small pleural effusions and significant retroperitoneal lymphadenopathy  cMRI at [facility] 4/18 1. Left ventricle is severely dilated (volume index 120 ml/m2) with  normal wall thickness. Left ventricular (LV) systolic function is  preserved (EF 81% with diastolic and systolic septal flattening consistent with right ventricular pressure/volume overload.   2. There is no late gadolinium enhancement (LGE) in the LV myocardium.  3. Right ventricle (RV) is severely dilated (volume index 181 ml/m2) with mildly reduced systolic function (EF 01%). There is no evidence of  ventricular interdependence.  4. There is severe biatrial enlargement. The interatrial septum bows towards the left atrium, suggestive  of increased right atrial pressure.   5. Visualized valves (aortic, mitral, and tricuspid) appear pliable. There is regurgitation present of all three valves, although this was  not quantified on this  study.   6. There is no pericardial effusion. Pericardium does not appear thickened. There is no LGE in the pericardium.   Labs 04/12/17  Sodium 141 K 4.1 Cr 0.84 Bili 1.5 Albumin 4.0   Past Medical History:  Diagnosis Date  . Anticoagulants causing adverse effect in therapeutic use   . Aortic aneurysm [facility])    s/p surgery at [facility]  . HTN (hypertension)   . Hyperlipidemia   . Hyperthyroidism    resolved per pt  . 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 Persistent atrial fibrillation    s/p ablations x3  . Testicular failure    Current Outpatient Medications  Medication Sig Dispense Refill  . allopurinol (ZYLOPRIM) 300 MG tablet Take 300 mg by mouth daily.    Marland Kitchen amiodarone (PACERONE) 200 MG tablet Take 1 tablet (200 mg total) by mouth 2 (two) times daily. 60 tablet 6  . apixaban (ELIQUIS) 5 MG TABS tablet TAKE ONE TABLET BY MOUTH 2 TIMES A DAY 180 tablet 3  . bumetanide (BUMEX) 2 MG tablet Take 1.5 tablets (3 mg total) by mouth 2 (two) times daily. 90 tablet 6  . HYDROcodone-acetaminophen (NORCO/VICODIN) 5-325 MG tablet Take 1 tablet by mouth 2 (two) times daily. As needed for pain    . iron polysaccharides (NIFEREX) 150 MG capsule Take 1 capsule (150 mg total) by mouth daily. 30 capsule 0  . LORazepam (ATIVAN) 0.5 MG tablet Take 0.5 mg by mouth every 8 (eight) hours as needed for anxiety.    . ondansetron (ZOFRAN) 8 MG tablet Take 8 mg by mouth every 8 (eight) hours as needed for nausea or vomiting.    . potassium chloride SA (K-DUR,KLOR-CON) 20 MEQ tablet Take 2 tablets (40 mEq) by mouth daily 180 tablet 1  . prochlorperazine (COMPAZINE) 10 MG tablet Take 10 mg by mouth every 6 (six) hours as needed for nausea or vomiting.    . sertraline (ZOLOFT) 100 MG tablet Take 100 mg by mouth 2 (two) times daily.    Marland Kitchen spironolactone (ALDACTONE) 25 MG tablet  Take 2 tablets (50 mg total) by mouth daily. 60 tablet 6  . terbinafine (LAMISIL) 250 MG tablet Take 250 mg by mouth daily.    . digoxin (LANOXIN) 0.125 MG tablet Take by mouth daily.     No current facility-administered medications for this encounter.    No Known Allergies  Social History   Socioeconomic History  . Marital status: Married    Spouse name: Not on file  . Number of children: Not on file  . Years of education: Not on file  . Highest education level: Not on file  Occupational History  . 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 Financial resource strain: Not on file  . Food insecurity:    Worry: Not on file    Inability: Not on file  . Transportation needs:    Medical: Not on file    Non-medical: Not on file  Tobacco Use  . Smoking status: Never Smoker  . Smokeless tobacco: Never Used  Substance and Sexual Activity  . Alcohol use: No  . Drug use: No  . Sexual activity: Yes    Partners: Female  Lifestyle  . Physical activity:  Days per week: Not on file    Minutes per session: Not on file  . Stress: Not on file  Relationships  . Social connections:    Talks on phone: Not on file    Gets together: Not on file    Attends religious service: Not on file    Active member of club or organization: Not on file    Attends meetings of clubs or organizations: Not on file    Relationship status: Not on file  . Intimate partner violence:    Fear of current or ex partner: Not on file    Emotionally abused: Not on file    Physically abused: Not on file    Forced sexual activity: Not on file  Other Topics Concern  . Not on file  Social History Narrative   Lives in [place].    Family History  Problem Relation Age of Onset  . Arrhythmia Mother    Vitals:   04/25/18 0906  BP: 124/72  Pulse: (!) 51  SpO2: 95%  Weight: 101.6 kg (224 lb)     Wt Readings from Last 3 Encounters:  04/25/18 101.6 kg (224  lb)  01/21/18 83.2 kg (183 lb 6.4 oz)  10/22/17 80.4 kg (177 lb 4 oz)    PHYSICAL EXAM: General: Chronically ill appearing. NAD at rest.  HEENT: Normal Neck: Supple. JVP to ear. Carotids 2+ bilat; no bruits. No thyromegaly or nodule noted. Cor: PMI nondisplaced. Regular, brady.  Lungs: Diminished throughout with bibasilar crackles Abdomen: Markedly distended and slightly tender. No HSM able to be palpated. No bruits or masses. +BS  Extremities: No cyanosis, clubbing, or rash. 3+ edema into thighs and nearly into flank. R foot with wrap.  Neuro: Alert & orientedx3, cranial nerves grossly intact. moves all 4 extremities w/o difficulty. Affect pleasant   EKG shows Wide QRS rhythm with ?PVCs, difficult to see P waves. + RBBB  ASSESSMENT & PLAN: 1. Acute on chronic biventricular HF with diastolic dysfunction - [facility]MRI 4/18 at [facility] with normal LV function. RV severely dilated and mild RV dysfunction - Echo 2/19. Normal LVEF with significant RV failure - Echo 10/2017 LVEF read as 35-40%, but over-read by Dr. Haroldine Laws to be ~50-55%. RV down. Stable per Dr. Haroldine Laws - NYHA IIIb symptoms - Volume status markedly elevated on exam. - Will diurese with IV lasix 80 mg BID and follow response. Will place PICC line for CVP/Coox.  - Needs repeat paracentesis.   - Continue spiro 50 daily for now. - Recent labs ok - Underwent paracentesis with 4.1 L out 04/24/2018. Needs repeat.  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 Cytology from last paracentesis without malignant cells  2. Recurrent PAF - S/p multiple ablations and MAZE. Failed DC-CV - EKG shows Wide QRS rhythm with ?PVCs, difficult to see P waves. + RBBB - This has been ongoing issue for him.  - Continue amio 100 mg BID (Intolerant to higher doses with tremors)  - Regular on exam today.  - Now off digoxin - Continue  Eliquis. Tolerating well   3. CLL - Per Onocology - Now s/p pelvic XRT  - Seems much improved after stopping Vanclexta  4. CAD and CABG x 1 - No s/s of ischemia.     5. Aortic root aneurysm s/p repair  - Stable.   6. RLE laceration - Seen at [facility] ED last Saturday  and had sutures placed. Now with clear drainage with marked volume overload.  - Will have WOC assess.   Will admit for diuresis with recent AKI and marked volume overload. May need to discuss goals of care depending on trajectory. With complicated co-morbidities, will ask IM to admit and we will follow closely.   Shirley Friar, PA-C  9:16 AM

## 2018-04-24 NOTE — Procedures (Signed)
PROCEDURE SUMMARY:  Successful US guided paracentesis from LLQ.  Yielded 4.1 L of clear yellow fluid.  No immediate complications.  Pt tolerated well.   Specimen was not sent for labs.  EBL < 10mL  Ascencion Dike PA-C 04/24/2018 3:16 PM

## 2018-04-25 ENCOUNTER — Other Ambulatory Visit: Payer: Self-pay

## 2018-04-25 ENCOUNTER — Inpatient Hospital Stay (HOSPITAL_COMMUNITY): Payer: Medicare Other

## 2018-04-25 ENCOUNTER — Encounter (HOSPITAL_COMMUNITY): Payer: Self-pay | Admitting: Internal Medicine

## 2018-04-25 ENCOUNTER — Inpatient Hospital Stay: Payer: Self-pay

## 2018-04-25 ENCOUNTER — Encounter (HOSPITAL_COMMUNITY): Payer: Self-pay

## 2018-04-25 ENCOUNTER — Ambulatory Visit (HOSPITAL_COMMUNITY)
Admission: RE | Admit: 2018-04-25 | Discharge: 2018-04-25 | Disposition: A | Discharge status: Home / Self Care | Payer: Medicare Other | Source: Ambulatory Visit | Attending: Cardiology | Admitting: Cardiology

## 2018-04-25 ENCOUNTER — Inpatient Hospital Stay (HOSPITAL_COMMUNITY)
Admission: AD | Admit: 2018-04-25 | Discharge: 2018-05-15 | DRG: 291 | Disposition: A | Discharge status: Home Health | Payer: Medicare Other | Source: Ambulatory Visit | Attending: Internal Medicine | Admitting: Internal Medicine

## 2018-04-25 VITALS — BP 124/72 | HR 51 | Wt 224.0 lb

## 2018-04-25 DIAGNOSIS — Z7189 Other specified counseling: Secondary | ICD-10-CM | POA: Diagnosis not present

## 2018-04-25 DIAGNOSIS — R609 Edema, unspecified: Secondary | ICD-10-CM | POA: Diagnosis present

## 2018-04-25 DIAGNOSIS — S99921A Unspecified injury of right foot, initial encounter: Secondary | ICD-10-CM | POA: Diagnosis not present

## 2018-04-25 DIAGNOSIS — Z8249 Family history of ischemic heart disease and other diseases of the circulatory system: Secondary | ICD-10-CM | POA: Insufficient documentation

## 2018-04-25 DIAGNOSIS — S81811A Laceration without foreign body, right lower leg, initial encounter: Secondary | ICD-10-CM | POA: Diagnosis present

## 2018-04-25 DIAGNOSIS — X58XXXA Exposure to other specified factors, initial encounter: Secondary | ICD-10-CM | POA: Diagnosis present

## 2018-04-25 DIAGNOSIS — L03116 Cellulitis of left lower limb: Secondary | ICD-10-CM | POA: Diagnosis present

## 2018-04-25 DIAGNOSIS — K140 Glossitis: Secondary | ICD-10-CM | POA: Diagnosis present

## 2018-04-25 DIAGNOSIS — R739 Hyperglycemia, unspecified: Secondary | ICD-10-CM | POA: Diagnosis not present

## 2018-04-25 DIAGNOSIS — J90 Pleural effusion, not elsewhere classified: Secondary | ICD-10-CM

## 2018-04-25 DIAGNOSIS — I50814 Right heart failure due to left heart failure: Secondary | ICD-10-CM | POA: Diagnosis not present

## 2018-04-25 DIAGNOSIS — I480 Paroxysmal atrial fibrillation: Secondary | ICD-10-CM

## 2018-04-25 DIAGNOSIS — I4819 Other persistent atrial fibrillation: Secondary | ICD-10-CM

## 2018-04-25 DIAGNOSIS — F419 Anxiety disorder, unspecified: Secondary | ICD-10-CM | POA: Diagnosis present

## 2018-04-25 DIAGNOSIS — E875 Hyperkalemia: Secondary | ICD-10-CM | POA: Diagnosis present

## 2018-04-25 DIAGNOSIS — M1990 Unspecified osteoarthritis, unspecified site: Secondary | ICD-10-CM | POA: Diagnosis present

## 2018-04-25 DIAGNOSIS — T8130XA Disruption of wound, unspecified, initial encounter: Secondary | ICD-10-CM | POA: Diagnosis present

## 2018-04-25 DIAGNOSIS — M7981 Nontraumatic hematoma of soft tissue: Secondary | ICD-10-CM | POA: Diagnosis present

## 2018-04-25 DIAGNOSIS — M545 Low back pain: Secondary | ICD-10-CM | POA: Diagnosis not present

## 2018-04-25 DIAGNOSIS — N182 Chronic kidney disease, stage 2 (mild): Secondary | ICD-10-CM | POA: Diagnosis present

## 2018-04-25 DIAGNOSIS — G4700 Insomnia, unspecified: Secondary | ICD-10-CM | POA: Diagnosis present

## 2018-04-25 DIAGNOSIS — R590 Localized enlarged lymph nodes: Secondary | ICD-10-CM

## 2018-04-25 DIAGNOSIS — G8929 Other chronic pain: Secondary | ICD-10-CM | POA: Diagnosis present

## 2018-04-25 DIAGNOSIS — I110 Hypertensive heart disease with heart failure: Secondary | ICD-10-CM | POA: Insufficient documentation

## 2018-04-25 DIAGNOSIS — D649 Anemia, unspecified: Secondary | ICD-10-CM | POA: Diagnosis present

## 2018-04-25 DIAGNOSIS — Z515 Encounter for palliative care: Secondary | ICD-10-CM | POA: Diagnosis present

## 2018-04-25 DIAGNOSIS — N179 Acute kidney failure, unspecified: Secondary | ICD-10-CM | POA: Diagnosis present

## 2018-04-25 DIAGNOSIS — R251 Tremor, unspecified: Secondary | ICD-10-CM | POA: Diagnosis present

## 2018-04-25 DIAGNOSIS — I5082 Biventricular heart failure: Secondary | ICD-10-CM | POA: Diagnosis present

## 2018-04-25 DIAGNOSIS — E876 Hypokalemia: Secondary | ICD-10-CM | POA: Diagnosis not present

## 2018-04-25 DIAGNOSIS — I959 Hypotension, unspecified: Secondary | ICD-10-CM | POA: Diagnosis not present

## 2018-04-25 DIAGNOSIS — C9110 Chronic lymphocytic leukemia of B-cell type not having achieved remission: Secondary | ICD-10-CM

## 2018-04-25 DIAGNOSIS — M109 Gout, unspecified: Secondary | ICD-10-CM | POA: Diagnosis present

## 2018-04-25 DIAGNOSIS — I071 Rheumatic tricuspid insufficiency: Secondary | ICD-10-CM | POA: Diagnosis present

## 2018-04-25 DIAGNOSIS — Z66 Do not resuscitate: Secondary | ICD-10-CM | POA: Diagnosis present

## 2018-04-25 DIAGNOSIS — D509 Iron deficiency anemia, unspecified: Secondary | ICD-10-CM | POA: Diagnosis present

## 2018-04-25 DIAGNOSIS — K7031 Alcoholic cirrhosis of liver with ascites: Secondary | ICD-10-CM

## 2018-04-25 DIAGNOSIS — I340 Nonrheumatic mitral (valve) insufficiency: Secondary | ICD-10-CM

## 2018-04-25 DIAGNOSIS — D696 Thrombocytopenia, unspecified: Secondary | ICD-10-CM | POA: Diagnosis not present

## 2018-04-25 DIAGNOSIS — Q2543 Congenital aneurysm of aorta: Secondary | ICD-10-CM | POA: Insufficient documentation

## 2018-04-25 DIAGNOSIS — R188 Other ascites: Secondary | ICD-10-CM | POA: Diagnosis not present

## 2018-04-25 DIAGNOSIS — I5043 Acute on chronic combined systolic (congestive) and diastolic (congestive) heart failure: Secondary | ICD-10-CM | POA: Diagnosis present

## 2018-04-25 DIAGNOSIS — E0590 Thyrotoxicosis, unspecified without thyrotoxic crisis or storm: Secondary | ICD-10-CM | POA: Diagnosis present

## 2018-04-25 DIAGNOSIS — E785 Hyperlipidemia, unspecified: Secondary | ICD-10-CM | POA: Diagnosis present

## 2018-04-25 DIAGNOSIS — I351 Nonrheumatic aortic (valve) insufficiency: Secondary | ICD-10-CM | POA: Diagnosis not present

## 2018-04-25 DIAGNOSIS — Z452 Encounter for adjustment and management of vascular access device: Secondary | ICD-10-CM

## 2018-04-25 DIAGNOSIS — T502X5A Adverse effect of carbonic-anhydrase inhibitors, benzothiadiazides and other diuretics, initial encounter: Secondary | ICD-10-CM | POA: Diagnosis not present

## 2018-04-25 DIAGNOSIS — K5900 Constipation, unspecified: Secondary | ICD-10-CM | POA: Diagnosis not present

## 2018-04-25 DIAGNOSIS — F411 Generalized anxiety disorder: Secondary | ICD-10-CM

## 2018-04-25 DIAGNOSIS — I5033 Acute on chronic diastolic (congestive) heart failure: Secondary | ICD-10-CM

## 2018-04-25 DIAGNOSIS — M7989 Other specified soft tissue disorders: Secondary | ICD-10-CM | POA: Diagnosis not present

## 2018-04-25 DIAGNOSIS — E871 Hypo-osmolality and hyponatremia: Secondary | ICD-10-CM | POA: Diagnosis not present

## 2018-04-25 DIAGNOSIS — S8012XA Contusion of left lower leg, initial encounter: Secondary | ICD-10-CM | POA: Diagnosis not present

## 2018-04-25 DIAGNOSIS — Z96653 Presence of artificial knee joint, bilateral: Secondary | ICD-10-CM | POA: Diagnosis present

## 2018-04-25 DIAGNOSIS — F329 Major depressive disorder, single episode, unspecified: Secondary | ICD-10-CM | POA: Diagnosis present

## 2018-04-25 DIAGNOSIS — M79609 Pain in unspecified limb: Secondary | ICD-10-CM | POA: Diagnosis not present

## 2018-04-25 DIAGNOSIS — Z7901 Long term (current) use of anticoagulants: Secondary | ICD-10-CM

## 2018-04-25 DIAGNOSIS — Z951 Presence of aortocoronary bypass graft: Secondary | ICD-10-CM

## 2018-04-25 DIAGNOSIS — Z79899 Other long term (current) drug therapy: Secondary | ICD-10-CM | POA: Insufficient documentation

## 2018-04-25 DIAGNOSIS — I130 Hypertensive heart and chronic kidney disease with heart failure and stage 1 through stage 4 chronic kidney disease, or unspecified chronic kidney disease: Principal | ICD-10-CM | POA: Diagnosis present

## 2018-04-25 DIAGNOSIS — I2510 Atherosclerotic heart disease of native coronary artery without angina pectoris: Secondary | ICD-10-CM | POA: Diagnosis present

## 2018-04-25 DIAGNOSIS — I4891 Unspecified atrial fibrillation: Secondary | ICD-10-CM | POA: Diagnosis present

## 2018-04-25 DIAGNOSIS — G4733 Obstructive sleep apnea (adult) (pediatric): Secondary | ICD-10-CM | POA: Diagnosis present

## 2018-04-25 DIAGNOSIS — E0781 Sick-euthyroid syndrome: Secondary | ICD-10-CM | POA: Diagnosis present

## 2018-04-25 DIAGNOSIS — I361 Nonrheumatic tricuspid (valve) insufficiency: Secondary | ICD-10-CM

## 2018-04-25 DIAGNOSIS — R161 Splenomegaly, not elsewhere classified: Secondary | ICD-10-CM | POA: Diagnosis present

## 2018-04-25 DIAGNOSIS — M25562 Pain in left knee: Secondary | ICD-10-CM | POA: Diagnosis present

## 2018-04-25 DIAGNOSIS — I509 Heart failure, unspecified: Secondary | ICD-10-CM

## 2018-04-25 DIAGNOSIS — R0989 Other specified symptoms and signs involving the circulatory and respiratory systems: Secondary | ICD-10-CM

## 2018-04-25 DIAGNOSIS — I50810 Right heart failure, unspecified: Secondary | ICD-10-CM

## 2018-04-25 HISTORY — PX: IR PARACENTESIS: IMG2679

## 2018-04-25 LAB — GRAM STAIN

## 2018-04-25 LAB — CBC
HEMATOCRIT: 26.5 % — AB (ref 39.0–52.0)
HEMOGLOBIN: 8.3 g/dL — AB (ref 13.0–17.0)
MCH: 28.6 pg (ref 26.0–34.0)
MCHC: 31.3 g/dL (ref 30.0–36.0)
MCV: 91.4 fL (ref 80.0–100.0)
Platelets: 268 10*3/uL (ref 150–400)
RBC: 2.9 MIL/uL — ABNORMAL LOW (ref 4.22–5.81)
RDW: 15.9 % — AB (ref 11.5–15.5)
WBC: 16.9 10*3/uL — AB (ref 4.0–10.5)
nRBC: 0.3 % — ABNORMAL HIGH (ref 0.0–0.2)

## 2018-04-25 LAB — ALBUMIN, PLEURAL OR PERITONEAL FLUID: Albumin, Fluid: 1.9 g/dL

## 2018-04-25 LAB — PROTEIN, PLEURAL OR PERITONEAL FLUID

## 2018-04-25 LAB — COMPREHENSIVE METABOLIC PANEL
ALT: 145 U/L — ABNORMAL HIGH (ref 0–44)
AST: 170 U/L — ABNORMAL HIGH (ref 15–41)
Albumin: 3.4 g/dL — ABNORMAL LOW (ref 3.5–5.0)
Alkaline Phosphatase: 115 U/L (ref 38–126)
Anion gap: 12 (ref 5–15)
BUN: 53 mg/dL — ABNORMAL HIGH (ref 8–23)
CALCIUM: 8.4 mg/dL — AB (ref 8.9–10.3)
CHLORIDE: 96 mmol/L — AB (ref 98–111)
CO2: 21 mmol/L — AB (ref 22–32)
CREATININE: 2.69 mg/dL — AB (ref 0.61–1.24)
GFR, EST AFRICAN AMERICAN: 25 mL/min — AB (ref >60)
GFR, EST NON AFRICAN AMERICAN: 22 mL/min — AB (ref >60)
Glucose, Bld: 141 mg/dL — ABNORMAL HIGH (ref 70–99)
Potassium: 5.5 mmol/L — ABNORMAL HIGH (ref 3.5–5.1)
SODIUM: 129 mmol/L — AB (ref 135–145)
Total Bilirubin: 1.5 mg/dL — ABNORMAL HIGH (ref 0.3–1.2)
Total Protein: 5.5 g/dL — ABNORMAL LOW (ref 6.5–8.1)

## 2018-04-25 LAB — LACTATE DEHYDROGENASE, PLEURAL OR PERITONEAL FLUID: LD, Fluid: 86 U/L — ABNORMAL HIGH (ref 3–23)

## 2018-04-25 LAB — BRAIN NATRIURETIC PEPTIDE: B NATRIURETIC PEPTIDE 5: 1544.3 pg/mL — AB (ref 0.0–100.0)

## 2018-04-25 LAB — BODY FLUID CELL COUNT WITH DIFFERENTIAL
Lymphs, Fluid: 43 %
Monocyte-Macrophage-Serous Fluid: 38 % — ABNORMAL LOW (ref 50–90)
Neutrophil Count, Fluid: 19 % (ref 0–25)
Total Nucleated Cell Count, Fluid: 195 cu mm (ref 0–1000)

## 2018-04-25 LAB — TSH: TSH: 8.246 u[IU]/mL — ABNORMAL HIGH (ref 0.350–4.500)

## 2018-04-25 LAB — HEMOGLOBIN AND HEMATOCRIT, BLOOD
HCT: 24.1 % — ABNORMAL LOW (ref 39.0–52.0)
Hemoglobin: 7.4 g/dL — ABNORMAL LOW (ref 13.0–17.0)

## 2018-04-25 LAB — COOXEMETRY PANEL
Carboxyhemoglobin: 1.4 % (ref 0.5–1.5)
Methemoglobin: 2.2 % — ABNORMAL HIGH (ref 0.0–1.5)
O2 SAT: 40.4 %
Total hemoglobin: 5.5 g/dL — CL (ref 12.0–16.0)

## 2018-04-25 LAB — DIGOXIN LEVEL

## 2018-04-25 MED ORDER — ALLOPURINOL 300 MG PO TABS
300.0000 mg | ORAL_TABLET | Freq: Every day | ORAL | Status: DC
  Administered 2018-04-25 – 2018-04-26 (×2): 300 mg via ORAL
  Filled 2018-04-25: qty 1

## 2018-04-25 MED ORDER — AMIODARONE HCL 200 MG PO TABS
200.0000 mg | ORAL_TABLET | Freq: Two times a day (BID) | ORAL | Status: DC
  Administered 2018-04-25 – 2018-04-26 (×2): 200 mg via ORAL
  Filled 2018-04-25: qty 1

## 2018-04-25 MED ORDER — FUROSEMIDE 10 MG/ML IJ SOLN: 80.0000 mg | Freq: Two times a day (BID) | INTRAMUSCULAR | Status: DC

## 2018-04-25 MED ORDER — LIDOCAINE HCL 1 % IJ SOLN
INTRAMUSCULAR | Status: DC | PRN
  Administered 2018-04-25: 10 mL

## 2018-04-25 MED ORDER — APIXABAN 5 MG PO TABS
5.0000 mg | ORAL_TABLET | Freq: Two times a day (BID) | ORAL | Status: DC
  Administered 2018-04-25 – 2018-05-08 (×26): 5 mg via ORAL
  Filled 2018-04-25 (×26): qty 1

## 2018-04-25 MED ORDER — SODIUM CHLORIDE 0.9% FLUSH
10.0000 mL | Freq: Two times a day (BID) | INTRAVENOUS | Status: DC
  Administered 2018-04-25 – 2018-04-26 (×2): 20 mL
  Administered 2018-04-27 – 2018-05-02 (×6): 10 mL
  Administered 2018-05-02 – 2018-05-06 (×3): 20 mL
  Administered 2018-05-09 – 2018-05-10 (×4): 10 mL
  Administered 2018-05-11: 20 mL
  Administered 2018-05-11: 10 mL
  Administered 2018-05-12: 20 mL
  Administered 2018-05-12 – 2018-05-13 (×2): 10 mL
  Administered 2018-05-13: 20 mL
  Administered 2018-05-14 – 2018-05-15 (×3): 10 mL

## 2018-04-25 MED ORDER — ACETAMINOPHEN 325 MG PO TABS: 650.0000 mg | ORAL_TABLET | Freq: Four times a day (QID) | ORAL | Status: DC | PRN

## 2018-04-25 MED ORDER — HYDROCODONE-ACETAMINOPHEN 5-325 MG PO TABS
1.0000 | ORAL_TABLET | Freq: Two times a day (BID) | ORAL | Status: DC | PRN
  Administered 2018-04-25 – 2018-05-06 (×16): 1 via ORAL
  Filled 2018-04-25 (×16): qty 1

## 2018-04-25 MED ORDER — PROCHLORPERAZINE MALEATE 10 MG PO TABS
10.0000 mg | ORAL_TABLET | Freq: Four times a day (QID) | ORAL | Status: DC | PRN
  Filled 2018-04-25: qty 1

## 2018-04-25 MED ORDER — SODIUM CHLORIDE 0.9 % IV SOLN
250.0000 mL | INTRAVENOUS | Status: DC | PRN
  Administered 2018-05-03: 250 mL via INTRAVENOUS

## 2018-04-25 MED ORDER — ONDANSETRON HCL 4 MG PO TABS
4.0000 mg | ORAL_TABLET | Freq: Four times a day (QID) | ORAL | Status: DC | PRN
  Administered 2018-04-26 – 2018-05-12 (×2): 4 mg via ORAL
  Filled 2018-04-25 (×3): qty 1

## 2018-04-25 MED ORDER — ACETAMINOPHEN 650 MG RE SUPP: 650.0000 mg | Freq: Four times a day (QID) | RECTAL | Status: DC | PRN

## 2018-04-25 MED ORDER — LORAZEPAM 0.5 MG PO TABS
0.5000 mg | ORAL_TABLET | Freq: Three times a day (TID) | ORAL | Status: DC | PRN
  Administered 2018-04-26 – 2018-05-02 (×11): 0.5 mg via ORAL
  Filled 2018-04-25 (×12): qty 1

## 2018-04-25 MED ORDER — LIDOCAINE HCL 1 % IJ SOLN
INTRAMUSCULAR | Status: AC
  Filled 2018-04-25: qty 20

## 2018-04-25 MED ORDER — POLYSACCHARIDE IRON COMPLEX 150 MG PO CAPS
150.0000 mg | ORAL_CAPSULE | Freq: Every day | ORAL | Status: DC
  Administered 2018-04-26 – 2018-05-15 (×20): 150 mg via ORAL
  Filled 2018-04-25 (×20): qty 1

## 2018-04-25 MED ORDER — SODIUM CHLORIDE 0.9% FLUSH
10.0000 mL | INTRAVENOUS | Status: DC | PRN
  Administered 2018-04-28 (×2): 20 mL
  Filled 2018-04-25 (×2): qty 40

## 2018-04-25 MED ORDER — SODIUM CHLORIDE 0.9% FLUSH
3.0000 mL | Freq: Two times a day (BID) | INTRAVENOUS | Status: DC
  Administered 2018-04-27 – 2018-05-03 (×9): 3 mL via INTRAVENOUS

## 2018-04-25 MED ORDER — SODIUM CHLORIDE 0.9% FLUSH: 3.0000 mL | INTRAVENOUS | Status: DC | PRN

## 2018-04-25 MED ORDER — FUROSEMIDE 10 MG/ML IJ SOLN
80.0000 mg | Freq: Once | INTRAMUSCULAR | Status: AC
  Administered 2018-04-25: 80 mg via INTRAVENOUS
  Filled 2018-04-25: qty 8

## 2018-04-25 MED ORDER — TERBINAFINE HCL 250 MG PO TABS
250.0000 mg | ORAL_TABLET | Freq: Every day | ORAL | Status: DC
  Administered 2018-04-25 – 2018-05-15 (×21): 250 mg via ORAL
  Filled 2018-04-25 (×21): qty 1

## 2018-04-25 MED ORDER — ONDANSETRON HCL 4 MG/2ML IJ SOLN
4.0000 mg | Freq: Four times a day (QID) | INTRAMUSCULAR | Status: DC | PRN
  Administered 2018-04-25 – 2018-05-15 (×19): 4 mg via INTRAVENOUS
  Filled 2018-04-25 (×19): qty 2

## 2018-04-25 MED ORDER — FUROSEMIDE 10 MG/ML IJ SOLN
20.0000 mg/h | INTRAVENOUS | Status: DC
  Administered 2018-04-25: 8 mg/h via INTRAVENOUS
  Administered 2018-04-26 – 2018-04-27 (×2): 15 mg/h via INTRAVENOUS
  Administered 2018-04-27 – 2018-04-28 (×2): 20 mg/h via INTRAVENOUS
  Filled 2018-04-25 (×4): qty 25
  Filled 2018-04-25: qty 21
  Filled 2018-04-25 (×2): qty 25

## 2018-04-25 MED ORDER — SERTRALINE HCL 100 MG PO TABS
200.0000 mg | ORAL_TABLET | Freq: Every day | ORAL | Status: DC
  Administered 2018-04-25 – 2018-05-15 (×21): 200 mg via ORAL
  Filled 2018-04-25 (×22): qty 2

## 2018-04-25 NOTE — Progress Notes (Addendum)
CRITICAL VALUE ALERT  Critical Value:  hgb 5.5 (via co-ox)  Date & Time Notied:  1/23 1900  Provider Notified: Schorr NP  Orders Received/Actions taken:  Repeat cbc, hgb was 7.4. No new orders

## 2018-04-25 NOTE — Consult Note (Addendum)
Advanced Heart Failure Consult Note     Advanced Heart Failure Team Consult Note   Primary Physician: Patient, No Pcp Per PCP-Cardiologist:  No primary care provider on file.  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 Sloan Leiter.   Antonio Carrillo is a 78 y.o. male with h/o AF s/p several ablations, aortic root aneurysm, s/p aortic root replacement with CABG 1, RV failure and CLL.   He was diagnosed with atrial fibrillation in 1977. He failed medical therapy with flecainide, rhythmol and multaq.  Not tried on TIkosyn or sotalol due to prolonged QT. Was started on amio. Was on amio for many years but stopped in 2017 for unclear reasons. He underwent atrial flutter ablation in [place] late 1990s.  Patient has had 3 previous atrial fibrillation ablations. In February 2016 the patient had aortic root replacement with CABG 1, Maze and left atrial appendage ligation at [facility] clinic.  More recently has been followed at [facility]. 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 Has been treated with Venetoclax, he has had fluid retention. [place] 5/18 with RA 3, v to 6, PA 27/8 (mean 15, PCWP 9 on right, 5 on left, PA sat 57%, CP/CI TD 6.4/3.1; F 4.4/2.1  He has had frequent adjustments of his diuretics in the setting of severe RV failure. Previously started on metolazone in addition to bumex. Had brisk response and felt it was too much so they stopped. Also had recurrent AF. Underwent DC[facility]CV 03/07/17 at [facility]. Went back into AF at end of December.   We saw him in early January 2019 and arranged for paracentesis which he had over 7L off. We saw him again in late January and had repeat paracentesis with 6.4L off. No malignant cells in ascitic fluid.  Bumex increased to 3 bid and spiro increased to 50 daily. Also started on amio for PAF.   In the Spring 2019 underwent pelvic XRT x 10 for CLL and bulky  lymphadenopathy. Also recently stopped Vanclexta.  He suffers from significant ascites requiring paracentesis with increase frequency. He had 3.0 L off 04/08/2018 and 4.1 L off 04/24/2018  He presented today at the HF clinic for follow up. He presented to office 04/24/2018 with complaints of edema and SOB, requesting paracentesis. We were able to schedule him a paracentesis for yesterday afternoon with 4.1 L off. He is up 40 lbs from visit in October, he says home weight up 40-50 lbs at home since Christmas. He thinks he may have been drinking too much. His foot was caught over the weekend and seen in ED at [facility] for sutures. Since it has been draining clear fluid in setting of volume overload. He feels terrible, with poor appetite. States he has had "maybe a bagel" in the past 5 days. Denies CP or palpitations. He is SOB with any exertion. He denies orthopnea or PND.   He was sent upstairs for admission with the assistance of IM team.   Echo 10/2017 LVEF read as 35-40%, but over-read by Dr. Haroldine Laws to be ~50-55%. RV down  Echo 05/18/17 LVEF normal RV markedly dilated and moderately HK. Moderate TR. IVC dilated   Recent CT scan 04/06/17 showed anasarca, ascites and small pleural effusions and significant retroperitoneal lymphadenopathy  cMRI at [facility] 4/18 1. Left ventricle is severely dilated (volume index 120 ml/m2) with  normal wall thickness. Left ventricular (LV) systolic function is              preserved (EF 42% with diastolic and systolic septal flattening               consistent with right ventricular pressure/volume overload.                               2. There is no late gadolinium enhancement (LGE) in the LV myocardium.                    3. Right ventricle (RV) is severely dilated (volume index 181 ml/m2)               with mildly reduced systolic function (EF 59%). There is no evidence of        ventricular interdependence.                                                               4. There is severe biatrial enlargement. The interatrial septum bows           towards the left atrium, suggestive of increased right atrial pressure.                   5. Visualized valves (aortic, mitral, and tricuspid) appear pliable.           There is regurgitation present of all three valves, although this was          not quantified on this study.                                                             6. There is no pericardial effusion. Pericardium does not appear                     thickened. There is no LGE in the pericardium.                                  Review of Systems: [y] = yes, [ ]  = no   General: Weight gain [y]; Weight loss [ ] ; Anorexia [ ] ; Fatigue [y]; Fever [ ] ; Chills [ ] ; Weakness [y]  Cardiac: Chest pain/pressure [ ] ; Resting SOB [ ] ; Exertional SOB [y]; Orthopnea [y]; Pedal Edema [y]; Palpitations [ ] ; Syncope [ ] ; Presyncope [ ] ; Paroxysmal nocturnal dyspnea[ ]   Pulmonary: Cough [y]; Wheezing[ ] ; Hemoptysis[ ] ; Sputum [ ] ; Snoring [ ]   GI: Vomiting[ ] ; Dysphagia[ ] ; Melena[ ] ; Hematochezia [ ] ; Heartburn[ ] ; Abdominal pain [ ] ; Constipation [ ] ; Diarrhea [ ] ; BRBPR [ ]   GU: Hematuria[ ] ; Dysuria [ ] ; Nocturia[ ]   Vascular: Pain in legs with walking [ ] ; Pain in feet with lying flat [ ] ; Non-healing sores [ ] ; Stroke [ ] ; TIA [ ] ; Slurred speech [ ] ;  Neuro: Headaches[ ] ; Vertigo[ ] ; Seizures[ ] ; Paresthesias[ ] ;Blurred vision [ ] ;  Diplopia [ ] ; Vision changes [ ]   Ortho/Skin: Arthritis [y]; Joint pain [y]; Muscle pain [ ] ; Joint swelling [ ] ; Back Pain [ ] ; Rash [ ]   Psych: Depression[ ] ; Anxiety[ ]   Heme: Bleeding problems [ ] ; Clotting disorders [ ] ; Anemia [y]  Endocrine: Diabetes [ ] ; Thyroid dysfunction[ ]   Home Medications Prior to Admission medications   Medication Sig Start Date End Date Taking? Authorizing Provider  allopurinol (ZYLOPRIM) 300 MG tablet Take 300 mg by mouth daily.   Yes [provider]  amiodarone (PACERONE)  200 MG tablet Take 1 tablet (200 mg total) by mouth 2 (two) times daily. 10/22/17  Yes Bensimhon, Shaune Pascal, MD  apixaban (ELIQUIS) 5 MG TABS tablet TAKE ONE TABLET BY MOUTH 2 TIMES A DAY Patient taking differently: Take 5 mg by mouth 2 (two) times daily.  12/17/17  Yes Bensimhon, Shaune Pascal, MD  BIOTIN PO Take 1 tablet by mouth daily.   Yes [provider]  bumetanide (BUMEX) 2 MG tablet Take 1.5 tablets (3 mg total) by mouth 2 (two) times daily. Patient taking differently: Take 4 mg by mouth 2 (two) times daily.  08/06/17  Yes Bensimhon, Shaune Pascal, MD  HYDROcodone-acetaminophen (NORCO/VICODIN) 5-325 MG tablet Take 1 tablet by mouth 2 (two) times daily as needed for moderate pain.    Yes [provider]  iron polysaccharides (NIFEREX) 150 MG capsule Take 1 capsule (150 mg total) by mouth daily. 08/16/17  Yes Bensimhon, Shaune Pascal, MD  lactobacillus acidophilus (BACID) TABS tablet Take 2 tablets by mouth daily.   Yes [provider]  LORazepam (ATIVAN) 0.5 MG tablet Take 0.5 mg by mouth every 8 (eight) hours as needed for anxiety.   Yes [provider]  ondansetron (ZOFRAN) 8 MG tablet Take 8 mg by mouth every 8 (eight) hours as needed for nausea or vomiting.   Yes [provider]  potassium chloride SA (K-DUR,KLOR-CON) 20 MEQ tablet Take 2 tablets (40 mEq) by mouth daily Patient taking differently: Take 60 mEq by mouth daily.  04/04/18  Yes Bensimhon, Shaune Pascal, MD  prochlorperazine (COMPAZINE) 10 MG tablet Take 10 mg by mouth every 6 (six) hours as needed for nausea or vomiting.   Yes [provider]  sertraline (ZOLOFT) 100 MG tablet Take 200 mg by mouth daily.    Yes [provider]  spironolactone (ALDACTONE) 25 MG tablet Take 2 tablets (50 mg total) by mouth daily. 08/06/17  Yes Bensimhon, Shaune Pascal, MD  terbinafine (LAMISIL) 250 MG tablet Take 250 mg by mouth daily.   Yes [provider]    Past Medical History: Past Medical History:    Diagnosis Date  . Anticoagulants causing adverse effect in therapeutic use   . Aortic aneurysm [facility])    s/p surgery at [facility]  . HTN (hypertension)   . Hyperlipidemia   . Hyperthyroidism    resolved per pt  . 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 Persistent atrial fibrillation    s/p ablations x3  . Testicular failure     Past Surgical History: Past Surgical History:  Procedure Laterality Date  . APPENDECTOMY    . ATRIAL ABLATION SURGERY  1. late 1990's  2. 02/01/06  3. 10/10   1. In [place]  2. At [facility]  3. Redo by [facility])  . BACK SURGERY    . CARDIOVERSION N/A 09/16/2012   Procedure: CARDIOVERSION;  Surgeon: Jolaine Artist, MD;  Location: [place];  Service: Cardiovascular;  Laterality: N/A;  angie will call back /heather [phone]  . CARDIOVERSION N/A 07/15/2015   Procedure: CARDIOVERSION;  Surgeon: Herminio Commons, MD;  Location: [place];  Service: Cardiovascular;  Laterality: N/A;  . CARDIOVERSION N/A 12/24/2015   Procedure: CARDIOVERSION;  Surgeon: Sueanne Margarita, MD;  Location: [place];  Service: Cardiovascular;  Laterality: N/A;  . IR PARACENTESIS  04/13/2017  . IR PARACENTESIS  04/25/2017  . IR PARACENTESIS  05/18/2017  . IR PARACENTESIS  06/21/2017  . IR PARACENTESIS  07/26/2017  . IR PARACENTESIS  08/17/2017  . IR PARACENTESIS  09/03/2017  . IR PARACENTESIS  04/08/2018  . IR PARACENTESIS  04/24/2018  . LAMINECTOMY     x2  . Stress cardiolite  01/29/06  . 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 TONSILLECTOMY    . TOTAL KNEE ARTHROPLASTY     bilateral  . TRANSTHORACIC ECHOCARDIOGRAM  02/08/06    Family History: Family History  Problem Relation Age of Onset  . Arrhythmia Mother     Social History: Social History   Socioeconomic History  . Marital status: Married    Spouse name: Not on file  . Number of children: Not on file  . Years of education: Not on file  . Highest education level: Not on file   Occupational History  . 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 Financial resource strain: Not on file  . Food insecurity:    Worry: Not on file    Inability: Not on file  . Transportation needs:    Medical: Not on file    Non-medical: Not on file  Tobacco Use  . Smoking status: Never Smoker  . Smokeless tobacco: Never Used  Substance and Sexual Activity  . Alcohol use: No  . Drug use: No  . Sexual activity: Yes    Partners: Female  Lifestyle  . Physical activity:    Days per week: Not on file    Minutes per session: Not on file  . Stress: Not on file  Relationships  . Social connections:    Talks on phone: Not on file    Gets together: Not on file    Attends religious service: Not on file    Active member of club or organization: Not on file    Attends meetings of clubs or organizations: Not on file    Relationship status: Not on file  Other Topics Concern  . Not on file  Social History Narrative   Lives in [place].    Vitals:   04/25/18 1029  BP: (!) 109/50  Pulse: (!) 51  Temp: 97.7 F (36.5 C)  TempSrc: Oral  SpO2: 99%  Weight: 101.3 kg  Height: 5\' 11"  (5.621 m)     Wt Readings from Last 3 Encounters:  04/25/18 101.3 kg  04/25/18 101.6 kg  01/21/18 83.2 kg     Physical Exam   General:  Chronically ill appearing. NAD at rest.  HEENT: Normal Neck: Supple. JVP to ear. Carotids 2+ bilat; no bruits. No thyromegaly or nodule noted. Cor: PMI nondisplaced. Regular, brady.  Lungs: Diminished throughout with bibasilar crackles Abdomen: Markedly distended and slightly tender. No HSM able to be palpated. No bruits or masses. +BS   Extremities: No cyanosis, clubbing, or rash. 3+ edema into thighs and nearly into flank. R foot with wrap.  Neuro: Alert & orientedx3, cranial nerves grossly  intact. moves all 4 extremities w/o difficulty. Affect pleasant   Telemetry   Not yet connected.   Patient  Profile   Antonio Carrillo is a 78 y.o. male  with h/o AF s/p several ablations, aortic root aneurysm, s/p aortic root replacement with CABG 1, RV failure, CLL, and ascites.   Sent for admission from HF clinic 04/25/2018 with marked volume overload on exam, RLE wound, and marked ascites.   Assessment and Plan   1. Acute on chronic biventricular HF with diastolic dysfunction - cMRI 4/18 at [facility] with normal LV function. RV severely dilated and mild RV dysfunction - Echo 2/19. Normal LVEF with significant RV failure - Echo 10/2017 LVEF read as 35-40%, but over-read by Dr. Haroldine Laws to be ~50-55%. RV down. Stable per Dr. Haroldine Laws - Repeat Echo. - NYHA IIIb symptoms - Volume status markedly elevated on exam. - Will diurese with IV lasix 80 mg BID and follow response. Will place PICC line for CVP/Coox.  - Needs repeat paracentesis.   - Continue spiro 50 daily for now. - Recent labs ok - Underwent paracentesis with 4.1 L out 04/24/2018. Needs repeat.  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 Cytology from last paracentesis without malignant cells  2. PAF - S/p multiple ablations and MAZE. Failed DC-CV - EKG shows Wide QRS rhythm with ?PVCs, difficult to see P waves. + RBBB - This has been ongoing issue for him.  - Continue amio 100 mg BID (Intolerant to higher doses with tremors)  - Regular on exam today.  - Now off digoxin - Continue Eliquis. Tolerating well   3. CLL - Per Onocology - Now s/p pelvic XRT  - Seems much improved after stopping Vanclexta - Hgb 8.3. Denies overt bleeding. ? 2/2 hypervolemia.   4. CAD and CABG x 1 - No s/s of ischemia.     5. Aortic root aneurysm s/p repair  - Stable.   6. RLE laceration - Seen at [facility] ED last Saturday and had sutures placed. Now with clear drainage with marked volume overload.  - Will have WOC assess. Denies fevers  or chills.   Have asked IM to admit with complicated co-morbidities including ascites, CLL, and RLE laceration/wound. We will follow along closely.   Shirley Friar, PA-C  10:38 AM   Advanced Heart Failure Team Pager [phone] (M-F; 7a - 4p)  Please contact [facility] for night-coverage after hours (4p -7a ) and weekends on [email or web address]  Patient seen and examined with the above-signed Advanced Practice Provider and/or Housestaff. I personally reviewed laboratory data, imaging studies and relevant notes. I independently examined the patient and formulated the important aspects of the plan. I have edited the note to reflect any of my changes or salient points. I have personally discussed the plan with the patient and/or family.  78 y/o male with NICM with biventricular HF R>L, cirrhosis with recurrent ascites, PAF and lymphoma. Presents to clinic today with massive volume overload due to ascites and RV failure.   On exam Ill appearing JVP to ear Cor RRR 2/6 TR Lungs decreased at bases Ab distended Ext 3+ edema.   Will admit to hospitalist service for paracentesis and IV diuresis. Has h/o PAF. Remains in NSR today on amio (we have tried to wean in past with cirrhosis but has not tolerated). No bleeding on Eliquis. We will place PICC line to follow  more closely. Will increase lasix gtt to 15/hr given CKD and amount of volume on board.   Glori Bickers, MD  11:06 AM

## 2018-04-25 NOTE — Progress Notes (Signed)
Care handoff given to [facility], RN on 4E

## 2018-04-25 NOTE — Addendum Note (Signed)
Encounter addended by: Jovita Kussmaul, RN on: 04/25/2018 9:53 AM  Actions taken: LDA properties accepted

## 2018-04-25 NOTE — Addendum Note (Signed)
Encounter addended by: Jovita Kussmaul, RN on: 04/25/2018 10:09 AM  Actions taken: Clinical Note Signed

## 2018-04-25 NOTE — Procedures (Signed)
PROCEDURE SUMMARY:  Successful image-guided paracentesis from the left lower abdomen.  Yielded 1.3 liters of amber fluid.  No immediate complications.  EBL: zero Patient tolerated well.   Specimen was sent for labs.  Please see imaging section of Epic for full dictation.  Joaquim Nam PA-C 04/25/2018 3:30 PM

## 2018-04-25 NOTE — Progress Notes (Signed)
Peripherally Inserted Central Catheter/Midline Placement  The IV Nurse has discussed with the patient and/or persons authorized to consent for the patient, the purpose of this procedure and the potential benefits and risks involved with this procedure.  The benefits include less needle sticks, lab draws from the catheter, and the patient may be discharged home with the catheter. Risks include, but not limited to, infection, bleeding, blood clot (thrombus formation), and puncture of an artery; nerve damage and irregular heartbeat and possibility to perform a PICC exchange if needed/ordered by physician.  Alternatives to this procedure were also discussed.  Bard Power PICC patient education guide, fact sheet on infection prevention and patient information card has been provided to patient /or left at bedside.  PICC inserted by Claretha Cooper, RN/Melissa Honeycutt, RN   PICC/Midline Placement Documentation  PICC Double Lumen 04/25/18 PICC Right Cephalic 42 cm 0 cm (Active)  Indication for Insertion or Continuance of Line Chronic illness with exacerbations (CF, Sickle Cell, etc.) 04/25/2018  6:13 PM  Exposed Catheter (cm) 0 cm 04/25/2018  6:13 PM  Site Assessment Clean;Dry;Intact 04/25/2018  6:13 PM  Lumen #1 Status Flushed;Saline locked;Blood return noted 04/25/2018  6:13 PM  Lumen #2 Status Flushed;Saline locked;Blood return noted 04/25/2018  6:13 PM  Dressing Type Transparent 04/25/2018  6:13 PM  Dressing Status Clean;Dry;Intact 04/25/2018  6:13 PM  Dressing Intervention New dressing 04/25/2018  6:13 PM  Dressing Change Due 05/02/18 04/25/2018  6:13 PM       Latisha Lasch, Nicolette Bang 04/25/2018, 6:14 PM

## 2018-04-25 NOTE — Consult Note (Signed)
[place] Nurse wound consult note Patient receiving care in [place].  Spouse and MD present at the time of my assessment. Reason for Consult: Right dorsal foot wound has high serous drainage to the sutured area Wound type: Trauma injury that occurred prior to hospitalization. Wound bed: Sutures intact, however due to the high serous drainage, the wound and surrounding tissue is very macerated. Drainage (amount, consistency, odor) No odor, no induration.  Foot is bruised Dressing procedure/placement/frequency: Place Aquacel Ag+ Kellie Simmering # [id number]) over the sutures on the right foot.  Then place a large foam dressing (Allevyn) over the Aquacel. Secure with kerlex.  Change each shift. Monitor the wound area(s) for worsening of condition such as: Signs/symptoms of infection,  Increase in size,  Development of or worsening of odor, Development of pain, or increased pain at the affected locations.  Notify the medical team if any of these develop.  Thank you for the consult.  Discussed plan of care with the patient and bedside nurse.  [place] nurse will not follow at this time.  Please re-consult the [place] team if needed.  Val Riles, RN, MSN, CWOCN, CNS-BC, pager [phone]

## 2018-04-25 NOTE — H&P (Signed)
History and Physical    Antonio Carrillo TFT:[id number] DOB: 02-Jul-1940 DOA: 04/25/2018  PCP: Patient, No Pcp Per  Patient coming from: Home.  Patient was sent from cardiology office.  I have personally briefly reviewed patient's old medical records in [facility] everywhere.   Chief Complaint: Leg swelling and edema.  Weight gain of 40 pounds in 1 month.  HPI: Antonio Carrillo is a 78 y.o. male with medical history significant of CLL, hypertension, hyperlipidemia, paroxysmal A. 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  Patient has extensive medical problems.  He has history of atrial fibrillation with multiple ablations, aortic root replacement with CABG, right ventricular heart failure and CLL.  Patient has been going through multiple diuretics to help with his leg swellings.  According to the patient, it was manageable until about a month now.  Patient stated that his legs are huge and edematous, they were skinny before Christmas.  Patient gained about 40 pounds of weight since 1 month.  He had some ascites in the past, however since last 1 month he had to have it tapped 3 times.  Patient also has persistent nausea but no vomiting.  Patient also has pain due to tightness of the legs.  He also injured his right leg at home and had to have sutures placed which are draining liquid around the suture lines.  Patient denies any chest pain or palpitation.  He is short of breath on exertion but none at rest.  He denies orthopnea or paroxysmal nocturnal dyspnea.  Patient recently on Bumex.  He went to see cardiologist today in the office, due to significant symptoms while sent to inpatient admission. Patient examined at the bedside.  He was on room air.  His blood pressures are stable.  WBC count is elevated, however chronic.  His  potassium was 5.5, he is on Aldactone and potassium supplement.  His hemoglobin is more or less at baseline.  Creatinine is 2.69 which is significantly above his baseline of 1.4.  Review of Systems: As per HPI otherwise 10 point review of systems negative.    Past Medical History:  Diagnosis Date  . Anticoagulants causing adverse effect in therapeutic use   . Aortic aneurysm [facility])    s/p surgery at [facility]  . HTN (hypertension)   . Hyperlipidemia   . Hyperthyroidism    resolved per pt  . 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 Persistent atrial fibrillation    s/p ablations x3  . Testicular failure     Past Surgical History:  Procedure Laterality Date  . APPENDECTOMY    . ATRIAL ABLATION SURGERY  1. late 1990's  2. 02/01/06  3. 10/10   1. In [place]  2. At [facility]  3. Redo by Greggory Brandy [facility])  . BACK SURGERY    . CARDIOVERSION N/A 09/16/2012   Procedure: CARDIOVERSION;  Surgeon: Jolaine Artist, MD;  Location: [facility] ENDOSCOPY;  Service: Cardiovascular;  Laterality: N/A;  angie will call back /heather [phone]  . CARDIOVERSION N/A 07/15/2015   Procedure: CARDIOVERSION;  Surgeon: Herminio Commons, MD;  Location: [place];  Service: Cardiovascular;  Laterality: N/A;  . CARDIOVERSION N/A 12/24/2015   Procedure: CARDIOVERSION;  Surgeon: Sueanne Margarita, MD;  Location: [place];  Service: Cardiovascular;  Laterality: N/A;  . IR PARACENTESIS  04/13/2017  . IR PARACENTESIS  04/25/2017  . IR PARACENTESIS  05/18/2017  . IR PARACENTESIS  06/21/2017  . IR PARACENTESIS  07/26/2017  . IR PARACENTESIS  08/17/2017  . IR PARACENTESIS  09/03/2017  . IR PARACENTESIS  04/08/2018  . IR PARACENTESIS  04/24/2018  . LAMINECTOMY     x2  . Stress cardiolite  01/29/06  . 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 TONSILLECTOMY    . TOTAL KNEE ARTHROPLASTY     bilateral  . TRANSTHORACIC ECHOCARDIOGRAM  02/08/06     reports that he has never smoked. He has never used  smokeless tobacco. He reports that he does not drink alcohol or use drugs.  No Known Allergies  Family History  Problem Relation Age of Onset  . Arrhythmia Mother      Prior to Admission medications   Medication Sig Start Date End Date Taking? Authorizing Provider  allopurinol (ZYLOPRIM) 300 MG tablet Take 300 mg by mouth daily.   Yes [provider]  amiodarone (PACERONE) 200 MG tablet Take 1 tablet (200 mg total) by mouth 2 (two) times daily. 10/22/17  Yes Bensimhon, Shaune Pascal, MD  apixaban (ELIQUIS) 5 MG TABS tablet TAKE ONE TABLET BY MOUTH 2 TIMES A DAY Patient taking differently: Take 5 mg by mouth 2 (two) times daily.  12/17/17  Yes Bensimhon, Shaune Pascal, MD  BIOTIN PO Take 1 tablet by mouth daily.   Yes [provider]  bumetanide (BUMEX) 2 MG tablet Take 1.5 tablets (3 mg total) by mouth 2 (two) times daily. Patient taking differently: Take 4 mg by mouth 2 (two) times daily.  08/06/17  Yes Bensimhon, Shaune Pascal, MD  HYDROcodone-acetaminophen (NORCO/VICODIN) 5-325 MG tablet Take 1 tablet by mouth 2 (two) times daily as needed for moderate pain.    Yes [provider]  iron polysaccharides (NIFEREX) 150 MG capsule Take 1 capsule (150 mg total) by mouth daily. 08/16/17  Yes Bensimhon, Shaune Pascal, MD  lactobacillus acidophilus (BACID) TABS tablet Take 2 tablets by mouth daily.   Yes [provider]  LORazepam (ATIVAN) 0.5 MG tablet Take 0.5 mg by mouth every 8 (eight) hours as needed for anxiety.   Yes [provider]  ondansetron (ZOFRAN) 8 MG tablet Take 8 mg by mouth every 8 (eight) hours as needed for nausea or vomiting.   Yes [provider]  potassium chloride SA (K-DUR,KLOR-CON) 20 MEQ tablet Take 2 tablets (40 mEq) by mouth daily Patient taking differently: Take 60 mEq by mouth daily.  04/04/18  Yes Bensimhon, Shaune Pascal, MD  prochlorperazine (COMPAZINE) 10 MG tablet Take 10 mg by mouth every 6 (six) hours as needed for nausea or vomiting.    Yes [provider]  sertraline (ZOLOFT) 100 MG tablet Take 200 mg by mouth daily.    Yes [provider]  spironolactone (ALDACTONE) 25 MG tablet Take 2 tablets (50 mg total) by mouth daily. 08/06/17  Yes Bensimhon, Shaune Pascal, MD  terbinafine (LAMISIL) 250 MG tablet Take 250 mg by mouth daily.   Yes [provider]    Physical Exam: Vitals:   04/25/18 1029  BP: (!) 109/50  Pulse: (!) 51  Temp: 97.7 F (36.5 C)  TempSrc: Oral  SpO2: 99%  Weight: 101.3 kg  Height: 5\' 11"  (1.803 m)    Constitutional: NAD, calm, comfortable Vitals:   04/25/18 1029  BP: (!) 109/50  Pulse: (!) 51  Temp: 97.7 F (36.5 C)  TempSrc: Oral  SpO2: 99%  Weight: 101.3 kg  Height: 5\' 11"  (1.803 m)   Eyes: PERRL, lids and conjunctivae normal Patient is fairly comfortable on room air.  Looks chronically sick but without any acute distress. ENMT: Mucous membranes are moist. Posterior pharynx clear of any exudate or lesions.Normal dentition.  Neck: normal, supple, no masses, no thyromegaly Respiratory: clear to auscultation bilaterally, no wheezing, Bibasilar fine crackles. Normal respiratory effort. No accessory muscle use.  Cardiovascular: Regular rate and rhythm, no murmurs / rubs / gallops. No extremity edema.  3 + pedal pulses up to the thighs. No carotid bruits.  Abdomen: no tenderness, no masses palpated.  Distended, no fluid thrill. No hepatosplenomegaly. Bowel sounds positive.  Musculoskeletal: no clubbing.  3+ pedal edema.  Right foot injury sutured with some maceration on the lateral side.  No joint deformity upper and lower extremities. Good ROM, no contractures. Normal muscle tone.  Skin: no rashes, lesions, ulcers. No induration Neurologic: CN 2-12 grossly intact. Sensation intact, DTR normal. Strength 5/5 in all 4.  Psychiatric: Normal judgment and insight. Alert and oriented x 3. Normal mood.     Labs on Admission: I have personally reviewed following labs and  imaging studies  CBC: Recent Labs  Lab 04/25/18 0949  WBC 16.9*  HGB 8.3*  HCT 26.5*  MCV 91.4  PLT 160   Basic Metabolic Panel: Recent Labs  Lab 04/25/18 0949  NA 129*  K 5.5*  CL 96*  CO2 21*  GLUCOSE 141*  BUN 53*  CREATININE 2.69*  CALCIUM 8.4*   GFR: Estimated Creatinine Clearance: 27 mL/min (A) (by C-G formula based on SCr of 2.69 mg/dL (H)). Liver Function Tests: Recent Labs  Lab 04/25/18 0949  AST 170*  ALT 145*  ALKPHOS 115  BILITOT 1.5*  PROT 5.5*  ALBUMIN 3.4*   No results for input(s): LIPASE, AMYLASE in the last 168 hours. No results for input(s): AMMONIA in the last 168 hours. Coagulation Profile: No results for input(s): INR, PROTIME in the last 168 hours. Cardiac Enzymes: No results for input(s): CKTOTAL, CKMB, CKMBINDEX, TROPONINI in the last 168 hours. BNP (last 3 results) No results for input(s): PROBNP in the last 8760 hours. HbA1C: No results for input(s): HGBA1C in the last 72 hours. CBG: No results for input(s): GLUCAP in the last 168 hours. Lipid Profile: No results for input(s): CHOL, HDL, LDLCALC, TRIG, CHOLHDL, LDLDIRECT in the last 72 hours. Thyroid Function Tests: Recent Labs    04/25/18 0949  TSH 8.246*   Anemia Panel: No results for input(s): VITAMINB12, FOLATE, FERRITIN, TIBC, IRON, RETICCTPCT in the last 72 hours. Urine analysis: No results found for: COLORURINE, APPEARANCEUR, LABSPEC, [place], GLUCOSEU, HGBUR, BILIRUBINUR, KETONESUR, PROTEINUR, UROBILINOGEN, NITRITE, LEUKOCYTESUR  Radiological Exams on Admission: [place] Ekg Site Rite  Result Date: 04/25/2018 If Site Rite image not attached, placement could not be confirmed due to current cardiac rhythm.  Ir Paracentesis  Result Date: 04/24/2018 INDICATION: History of CHF and lymphoma. Recurrent ascites. Request for therapeutic paracentesis. EXAM: ULTRASOUND GUIDED LEFT LOWER QUADRANT PARACENTESIS MEDICATIONS: None. COMPLICATIONS: None immediate. PROCEDURE: Informed  written consent was obtained from the patient after a discussion of the risks, benefits and alternatives to treatment. A timeout was performed prior to the initiation of the procedure. Initial ultrasound scanning demonstrates a large amount of ascites within the left lower abdominal quadrant. The left lower abdomen was prepped and draped in the usual sterile fashion. 1% lidocaine with epinephrine was used for local anesthesia. Following this, a 19 gauge, 7-cm, Teressa Lower  catheter was introduced. An ultrasound image was saved for documentation purposes. The paracentesis was performed. The catheter was removed and a dressing was applied. The patient tolerated the procedure well without immediate post procedural complication. FINDINGS: A total of approximately 4.1 L of clear yellow fluid was removed. IMPRESSION: Successful ultrasound-guided paracentesis yielding 4.1 liters of peritoneal fluid. Read by: Ascencion Dike PA-C Electronically Signed   By: Marybelle Killings M.D.   On: 04/24/2018 15:21    EKG: Ordered.  Assessment/Plan Principal Problem:   Acute on chronic combined systolic and diastolic CHF (congestive heart failure) (HCC) Active Problems:   ATRIAL FIBRILLATION   AKI (acute kidney injury) (HCC)   Hyperkalemia   Ascites   CLL (chronic lymphocytic leukemia) (HCC)   Anemia   CHF (congestive heart failure) (HCC)   Acute on chronic combined systolic and diastolic heart failure: Severe right ventricular dysfunction with anasarca: Agree with admission to monitored unit given severity of symptoms.  PICC line placement for CVP monitoring and medications. Discussed with cardiology.  Will give a bolus dose of Lasix 80 mg and then start patient on Lasix infusion 8 mg/h for continuous diuresis.  Strict intake and output monitoring.  Daily weight.  Daily electrolyte monitoring.  Holding Aldactone today because of hyperkalemia.  Once normalizes, will be able to resume it.  Acute renal failure: Suspect cardiorenal  syndrome: We will treat with diuresis infusion.  Recheck levels in the morning.  Intake and output monitoring.  Chronic atrial fibrillation: Currently in sinus rhythm.  Rate controlled.  Continue amiodarone.  Therapeutic on Eliquis.  Hyperkalemia: Probably due to acute renal failure, Aldactone and potassium supplement.  Discontinue Aldactone and potassium supplements.  Will recheck in the morning.  Ascites: Probably due to right heart failure.  He had 4 L paracentesis yesterday.  Currently without tense ascites.  He will not benefit with paracentesis today.  CLL: Chronic and stable.  Anemia of chronic disease: Chronic and stable.  DVT prophylaxis: Eliquis. Code Status: Full code. Family Communication: Wife at the bedside. Disposition Plan: Home. Consults called: Cardiology. Admission status: Inpatient to stepdown telemetry.   Barb Merino MD [facility] Pager [phone]  If 7PM-7AM, please contact night-coverage [email or web address] Password [email or web address]  04/25/2018, 11:40 AM

## 2018-04-25 NOTE — Progress Notes (Signed)
  Echocardiogram 2D Echocardiogram has been performed.  Antonio Carrillo 04/25/2018, 5:30 PM

## 2018-04-25 NOTE — Addendum Note (Signed)
Encounter addended by: Valeda Malm, RN on: 04/25/2018 10:08 AM  Actions taken: Diagnosis association updated, Order list changed

## 2018-04-26 ENCOUNTER — Encounter (HOSPITAL_COMMUNITY): Payer: Medicare Other | Admitting: Internal Medicine

## 2018-04-26 DIAGNOSIS — S99921A Unspecified injury of right foot, initial encounter: Secondary | ICD-10-CM

## 2018-04-26 DIAGNOSIS — Z515 Encounter for palliative care: Secondary | ICD-10-CM

## 2018-04-26 LAB — CBC WITH DIFFERENTIAL/PLATELET
Abs Immature Granulocytes: 0.07 10*3/uL (ref 0.00–0.07)
Basophils Absolute: 0 10*3/uL (ref 0.0–0.1)
Basophils Relative: 0 %
Eosinophils Absolute: 0.1 10*3/uL (ref 0.0–0.5)
Eosinophils Relative: 0 %
HCT: 25.5 % — ABNORMAL LOW (ref 39.0–52.0)
Hemoglobin: 7.7 g/dL — ABNORMAL LOW (ref 13.0–17.0)
Immature Granulocytes: 1 %
LYMPHS ABS: 1.6 10*3/uL (ref 0.7–4.0)
LYMPHS PCT: 11 %
MCH: 27.9 pg (ref 26.0–34.0)
MCHC: 30.2 g/dL (ref 30.0–36.0)
MCV: 92.4 fL (ref 80.0–100.0)
Monocytes Absolute: 1.3 10*3/uL — ABNORMAL HIGH (ref 0.1–1.0)
Monocytes Relative: 9 %
Neutro Abs: 10.8 10*3/uL — ABNORMAL HIGH (ref 1.7–7.7)
Neutrophils Relative %: 79 %
Platelets: 228 10*3/uL (ref 150–400)
RBC: 2.76 MIL/uL — ABNORMAL LOW (ref 4.22–5.81)
RDW: 16.1 % — ABNORMAL HIGH (ref 11.5–15.5)
WBC: 13.7 10*3/uL — ABNORMAL HIGH (ref 4.0–10.5)
nRBC: 0.1 % (ref 0.0–0.2)

## 2018-04-26 LAB — IRON AND TIBC
Iron: 11 ug/dL — ABNORMAL LOW (ref 45–182)
Saturation Ratios: 4 % — ABNORMAL LOW (ref 17.9–39.5)
TIBC: 298 ug/dL (ref 250–450)
UIBC: 287 ug/dL

## 2018-04-26 LAB — BASIC METABOLIC PANEL
Anion gap: 11 (ref 5–15)
BUN: 48 mg/dL — ABNORMAL HIGH (ref 8–23)
CHLORIDE: 93 mmol/L — AB (ref 98–111)
CO2: 23 mmol/L (ref 22–32)
Calcium: 8.1 mg/dL — ABNORMAL LOW (ref 8.9–10.3)
Creatinine, Ser: 2.43 mg/dL — ABNORMAL HIGH (ref 0.61–1.24)
GFR calc non Af Amer: 24 mL/min — ABNORMAL LOW (ref >60)
GFR, EST AFRICAN AMERICAN: 28 mL/min — AB (ref >60)
Glucose, Bld: 213 mg/dL — ABNORMAL HIGH (ref 70–99)
Potassium: 4.6 mmol/L (ref 3.5–5.1)
Sodium: 127 mmol/L — ABNORMAL LOW (ref 135–145)

## 2018-04-26 LAB — PATHOLOGIST SMEAR REVIEW

## 2018-04-26 LAB — COOXEMETRY PANEL
Carboxyhemoglobin: 0.1 % — ABNORMAL LOW (ref 0.5–1.5)
Carboxyhemoglobin: 0.3 % — ABNORMAL LOW (ref 0.5–1.5)
Methemoglobin: 1.1 % (ref 0.0–1.5)
Methemoglobin: 0.7 % (ref 0.0–1.5)
O2 Saturation: 29.7 %
O2 Saturation: 46.4 %
Total hemoglobin: 8 g/dL — ABNORMAL LOW (ref 12.0–16.0)
Total hemoglobin: 7.6 g/dL — ABNORMAL LOW (ref 12.0–16.0)

## 2018-04-26 LAB — ECHOCARDIOGRAM COMPLETE
Height: 71 in
Weight: 3574.4 oz

## 2018-04-26 LAB — RETICULOCYTES
Immature Retic Fract: 31 % — ABNORMAL HIGH (ref 2.3–15.9)
RBC.: 2.15 MIL/uL — ABNORMAL LOW (ref 4.22–5.81)
Retic Count, Absolute: 91.8 10*3/uL (ref 19.0–186.0)
Retic Ct Pct: 4.3 % — ABNORMAL HIGH (ref 0.4–3.1)

## 2018-04-26 LAB — PH, BODY FLUID: pH, Body Fluid: 7.6

## 2018-04-26 LAB — FERRITIN: Ferritin: 30 ng/mL (ref 24–336)

## 2018-04-26 MED ORDER — ALLOPURINOL 100 MG PO TABS
100.0000 mg | ORAL_TABLET | Freq: Every day | ORAL | Status: DC
  Administered 2018-04-27 – 2018-05-15 (×19): 100 mg via ORAL
  Filled 2018-04-26 (×20): qty 1

## 2018-04-26 MED ORDER — DOXYCYCLINE HYCLATE 100 MG PO TABS
100.0000 mg | ORAL_TABLET | Freq: Two times a day (BID) | ORAL | Status: DC
  Administered 2018-04-26 – 2018-04-30 (×9): 100 mg via ORAL
  Filled 2018-04-26 (×9): qty 1

## 2018-04-26 MED ORDER — AMIODARONE HCL 200 MG PO TABS
100.0000 mg | ORAL_TABLET | Freq: Two times a day (BID) | ORAL | Status: DC
  Administered 2018-04-26: 100 mg via ORAL
  Filled 2018-04-26 (×2): qty 1

## 2018-04-26 MED ORDER — MILRINONE LACTATE IN DEXTROSE 20-5 MG/100ML-% IV SOLN
0.5000 ug/kg/min | INTRAVENOUS | Status: DC
  Administered 2018-04-26: 0.375 ug/kg/min via INTRAVENOUS
  Administered 2018-04-26: 0.25 ug/kg/min via INTRAVENOUS
  Administered 2018-04-27: 0.5 ug/kg/min via INTRAVENOUS
  Administered 2018-04-27: 0.375 ug/kg/min via INTRAVENOUS
  Administered 2018-04-27 – 2018-04-30 (×7): 0.5 ug/kg/min via INTRAVENOUS
  Filled 2018-04-26 (×15): qty 100
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
## 2018-04-26 NOTE — Progress Notes (Addendum)
   Patient seen briefly and chart reviewed.  Patient's wife, Pamala Hurry, is at the bedside.  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  Patient informs me that he knows he's sick, "my family will make any other decisions", and indicated he was tired of talking about it.  At this point cardiology services came into the room again and patient asked that this writer leave to speak to cardiologist  Palliative medicine to shadow the chart for place to reengage otherwise we will sign off.  If patient and family are amenable to community palliative care once he is discharged home, those services can be accessed through either [facility] at [phone]; [facility] palliative medicine division at [phone].  Thank you, Romona Curls, NP Native medicine division

## 2018-04-26 NOTE — Progress Notes (Addendum)
PROGRESS NOTE    Antonio Carrillo   HDQ:[id number]  DOB: 05-Jul-1940  DOA: 04/25/2018 PCP: Patient, No Pcp Per   Brief Narrative:  Antonio Carrillo is a 78 y.o. male with medical history significant of CLL, hypertension, hyperlipidemia, paroxysmal A. 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   Subjective: Feels anxious and asks if he can have his Lorazepam. Does not feel abdominal girth has decreased after the paracentesis.   Foot evaluated. No pain in foot and he is able to ambulate on it.    Assessment & Plan:   Principal Problem:   Acute on chronic combined systolic and diastolic CHF with hyponatremia, ascites, pedal edema - management by cardiology - palliative care discussions per cardiology  Active Problems: PAF - management per cardiology-  AKI (acute kidney injury)  - baseline < 1- now ~ 2.5 - likely cardiorenal  Hyperkalemia - improved  Left foot wound- leukocytosis - received sutures by Ortho on 1/15 at [facility] along with Keflex - was due to see them on this past Wed but did not make it - weeping profusely likely due to pedal edema- see pictures below  - have asked ortho to evaluate the wound  - elevate foot as much as possible  - Addendum: I spoke with ortho, Dr Doreatha Martin, in regards to the foot - will be starting Doxycyline- he has removed the sutures and will be ordering dressing changes  Anxiety/ depression - Zoloft daily, Ativan PRN  Anemia, chronic - baseline 8-9- 7.7 today- check anemia panel - on Niferex daily - continue    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 - follows a [facility] - note from 12/30 /19 recommends observation only after his treatment and repeat CT in 3 months  Gout - Allopurinol  Elevated TSH - may be sick  euthyroid- f/u Free T 4   Time spent in minutes: 35 DVT prophylaxis: Eliquis Code Status: Full code Family Communication:  Disposition Plan: to be determined Consultants:   Heart failure team   ortho Procedures:     Antimicrobials:  Anti-infectives (From admission, onward)   Start     Dose/Rate Route Frequency Ordered Stop   04/25/18 1300  terbinafine (LAMISIL) tablet 250 mg     250 mg Oral Daily 04/25/18 1143         Objective: Vitals:   04/25/18 2119 04/25/18 2309 04/26/18 0327 04/26/18 0743  BP: 101/61 (!) 107/59 124/73 129/65  Pulse: (!) 53 (!) 52 63   Resp: (!) 22 15    Temp: 97.6 F (36.4 C) 97.8 F (36.6 C) 97.8 F (36.6 C)   TempSrc: Oral Oral Oral   SpO2: 98% 98% 100% 100%  Weight:   100.1 kg   Height:        Intake/Output Summary (Last 24 hours) at 04/26/2018 1200 Last data filed at 04/26/2018 1194 Gross per 24 hour  Intake 456.75 ml  Output 2125 ml  Net -1668.25 ml   Filed Weights   04/25/18 1029 04/26/18 0327  Weight: 101.3 kg 100.1 kg    Examination: General exam: Appears comfortable  HEENT: PERRLA, oral mucosa moist, no sclera icterus or thrush Respiratory system: bilateral crackles Cardiovascular system: S1 & S2 heard, RRR.   Gastrointestinal system: Abdomen soft, non-tender,quite distended. Normal bowel sounds. Central nervous system: Alert and  oriented. No focal neurological deficits. Extremities: severe pedal edema with purple discoloration of b/l toes and forefoot Psychiatry:  Slightly depressed and anxious Skin: see below          Data Reviewed: I have personally reviewed following labs and imaging studies  CBC: Recent Labs  Lab 04/25/18 0949 04/25/18 2008 04/26/18 0309  WBC 16.9*  --  13.7*  NEUTROABS  --   --  10.8*  HGB 8.3* 7.4* 7.7*  HCT 26.5* 24.1* 25.5*  MCV 91.4  --  92.4  PLT 268  --  619   Basic Metabolic Panel: Recent Labs  Lab 04/25/18 0949 04/26/18 0309  NA 129* 127*  K 5.5* 4.6  CL 96* 93*    CO2 21* 23  GLUCOSE 141* 213*  BUN 53* 48*  CREATININE 2.69* 2.43*  CALCIUM 8.4* 8.1*   GFR: Estimated Creatinine Clearance: 29.7 mL/min (A) (by C-G formula based on SCr of 2.43 mg/dL (H)). Liver Function Tests: Recent Labs  Lab 04/25/18 0949  AST 170*  ALT 145*  ALKPHOS 115  BILITOT 1.5*  PROT 5.5*  ALBUMIN 3.4*   No results for input(s): LIPASE, AMYLASE in the last 168 hours. No results for input(s): AMMONIA in the last 168 hours. Coagulation Profile: No results for input(s): INR, PROTIME in the last 168 hours. Cardiac Enzymes: No results for input(s): CKTOTAL, CKMB, CKMBINDEX, TROPONINI in the last 168 hours. BNP (last 3 results) No results for input(s): PROBNP in the last 8760 hours. HbA1C: No results for input(s): HGBA1C in the last 72 hours. CBG: No results for input(s): GLUCAP in the last 168 hours. Lipid Profile: No results for input(s): CHOL, HDL, LDLCALC, TRIG, CHOLHDL, LDLDIRECT in the last 72 hours. Thyroid Function Tests: Recent Labs    04/25/18 0949  TSH 8.246*   Anemia Panel: No results for input(s): VITAMINB12, FOLATE, FERRITIN, TIBC, IRON, RETICCTPCT in the last 72 hours. Urine analysis: No results found for: COLORURINE, APPEARANCEUR, LABSPEC, PHURINE, GLUCOSEU, HGBUR, BILIRUBINUR, KETONESUR, PROTEINUR, UROBILINOGEN, NITRITE, LEUKOCYTESUR Sepsis Labs: @LABRCNTIP (procalcitonin:4,lacticidven:4) ) Recent Results (from the past 240 hour(s))  Gram stain     Status: None   Collection Time: 04/25/18  3:28 PM  Result Value Ref Range Status   Specimen Description FLUID PERITONEAL  Final   Special Requests NONE  Final   Gram Stain   Final    WBC PRESENT,BOTH PMN AND MONONUCLEAR NO ORGANISMS SEEN CYTOSPIN SMEAR Performed at [facility], [address], [place], [place] [address]    Report Status 04/25/2018 FINAL  Final         Radiology Studies: Dg Chest Port 1 View  Result Date: 04/25/2018 CLINICAL DATA:  PICC line placement EXAM:  PORTABLE CHEST 1 VIEW COMPARISON:  07/22/2015 FINDINGS: Interval placement of RIGHT PICC line tip in distal SVC. Sternotomy wires and atrial clip noted. No pulmonary edema or pneumothorax. IMPRESSION: PICC line in good position. Electronically Signed   By: Suzy Bouchard M.D.   On: 04/25/2018 19:00   [place] Ekg Site Rite  Result Date: 04/25/2018 If Site Rite image not attached, placement could not be confirmed due to current cardiac rhythm.  Ir Paracentesis  Result Date: 04/25/2018 INDICATION: Patient with history of CLL, atrial fibrillation severe right ventricular heart failure and recurrent ascites. Patient underwent paracentesis yesterday in IR with 4.1 L of clear yellow fluid removed. He has since been admitted to the hospital due worsening peripheral edema, lower extremity wound and dyspnea. Request for repeat diagnostic and therapeutic paracentesis today  in IR. EXAM: ULTRASOUND GUIDED DIAGNOSTIC AND THERAPEUTIC PARACENTESIS MEDICATIONS: 10 mL 1% lidocaine. COMPLICATIONS: None immediate. PROCEDURE: Informed written consent was obtained from the patient after a discussion of the risks, benefits and alternatives to treatment. A timeout was performed prior to the initiation of the procedure. Initial ultrasound scanning demonstrates a moderate amount of ascites within the left lower abdominal quadrant. The left lower abdomen was prepped and draped in the usual sterile fashion. 1% lidocaine was used for local anesthesia. Following this, a 19 gauge, 7-cm, Yueh catheter was introduced. An ultrasound image was saved for documentation purposes. The paracentesis was performed. The catheter was removed and a dressing was applied. The patient tolerated the procedure well without immediate post procedural complication. FINDINGS: A total of approximately 1.3 L of amber fluid was removed. Samples were sent to the laboratory as requested by the clinical team. IMPRESSION: Successful ultrasound-guided paracentesis  yielding 1.3 liters of peritoneal fluid. Read by Candiss Norse, PA-C Electronically Signed   By: Jacqulynn Cadet M.D.   On: 04/25/2018 15:48   Ir Paracentesis  Result Date: 04/24/2018 INDICATION: History of CHF and lymphoma. Recurrent ascites. Request for therapeutic paracentesis. EXAM: ULTRASOUND GUIDED LEFT LOWER QUADRANT PARACENTESIS MEDICATIONS: None. COMPLICATIONS: None immediate. PROCEDURE: Informed written consent was obtained from the patient after a discussion of the risks, benefits and alternatives to treatment. A timeout was performed prior to the initiation of the procedure. Initial ultrasound scanning demonstrates a large amount of ascites within the left lower abdominal quadrant. The left lower abdomen was prepped and draped in the usual sterile fashion. 1% lidocaine with epinephrine was used for local anesthesia. Following this, a 19 gauge, 7-cm, Yueh catheter was introduced. An ultrasound image was saved for documentation purposes. The paracentesis was performed. The catheter was removed and a dressing was applied. The patient tolerated the procedure well without immediate post procedural complication. FINDINGS: A total of approximately 4.1 L of clear yellow fluid was removed. IMPRESSION: Successful ultrasound-guided paracentesis yielding 4.1 liters of peritoneal fluid. Read by: Ascencion Dike PA-C Electronically Signed   By: Marybelle Killings M.D.   On: 04/24/2018 15:21      Scheduled Meds: . [START ON 04/27/2018] allopurinol  100 mg Oral Daily  . amiodarone  100 mg Oral BID  . apixaban  5 mg Oral BID  . iron polysaccharides  150 mg Oral Daily  . sertraline  200 mg Oral Daily  . sodium chloride flush  10-40 mL Intracatheter Q12H  . sodium chloride flush  3 mL Intravenous Q12H  . terbinafine  250 mg Oral Daily   Continuous Infusions: . sodium chloride    . furosemide (LASIX) infusion 15 mg/hr (04/26/18 0742)  . milrinone 0.25 mcg/kg/min (04/26/18 0914)     LOS: 1 day       Debbe Odea, MD [facility] Pager: [email or web address] Password [email or web address] 04/26/2018, 12:00 PM

## 2018-04-26 NOTE — Progress Notes (Addendum)
Advanced Heart Failure Rounding Note  PCP-Cardiologist: No primary care provider on file.   Subjective:    Admitted from clinic 04/25/2018 with marked volume overloaded and abdominal distention. PICC placed.   Initial coox 40.4 -> 29.7.   Cr 2.69 -> 2.43 with diuresis. K improved to 4.6.   Had repeat paracentesis with 1.3 L out.  CVP 22  Given 80 mg IV lasix, then started on lasix gtt at 8 mg/hr increased to 15 mg/hr. Weight down 3 lbs with >1.5 L (paracentesis not included). His baseline weight is closer to 180 lbs (220 this am)  He is feeling slightly better this am. Concerned about the quantity of tests he had ran yesterday. He understands he is very ill, and has no options other than medication. He states he would not want consideration for VAD or transplant if he were a candidate, and at this point does not want to consider dialysis. He would only want heroic measures if he would be likely to survive, and have quality of life after, but is not ready to make any decisions about code status prior to speaking with his family.   Hgb 7.7 this am. No overt bleeding.   Echo performed 04/25/2018. Formal read pending.  Objective:   Weight Range: 100.1 kg Body mass index is 30.78 kg/m.   Vital Signs:   Temp:  [97.6 F (36.4 C)-97.8 F (36.6 C)] 97.8 F (36.6 C) (01/24 0327) Pulse Rate:  [51-63] 63 (01/24 0327) Resp:  [15-22] 15 (01/23 2309) BP: (101-124)/(50-73) 124/73 (01/24 0327) SpO2:  [98 %-100 %] 100 % (01/24 0327) Weight:  [100.1 kg-101.3 kg] 100.1 kg (01/24 0327) Last BM Date: 04/25/18  Weight change: Filed Weights   04/25/18 1029 04/26/18 0327  Weight: 101.3 kg 100.1 kg   Intake/Output:   Intake/Output Summary (Last 24 hours) at 04/26/2018 0740 Last data filed at 04/26/2018 0308 Gross per 24 hour  Intake 456.75 ml  Output 1925 ml  Net -1468.25 ml    Physical Exam    General:  Chronically ill appearing.  HEENT: Normal Neck: Supple. JVP to ear. Carotids 2+  bilat; no bruits. No lymphadenopathy or thyromegaly appreciated. Cor: PMI nondisplaced. Regular rate & rhythm. ? S3 and RV lift.  Lungs: Diminished basilar sounds.  Abdomen: Distended, slightly tender s/p 2 paracenteses. No hepatosplenomegaly. No bruits or masses. Good bowel sounds. Extremities: No cyanosis or clubbing. 3+ edema into thighs, with areas of skin breakdown bilaterally.  Neuro: Alert & orientedx3, cranial nerves grossly intact. moves all 4 extremities w/o difficulty. Affect flat but appropriate.   Telemetry   NSR 60-70s, personally reviewed.  EKG    Pending.  Labs    CBC Recent Labs    04/25/18 0949 04/25/18 2008 04/26/18 0309  WBC 16.9*  --  13.7*  NEUTROABS  --   --  10.8*  HGB 8.3* 7.4* 7.7*  HCT 26.5* 24.1* 25.5*  MCV 91.4  --  92.4  PLT 268  --  093   Basic Metabolic Panel Recent Labs    04/25/18 0949 04/26/18 0309  NA 129* 127*  K 5.5* 4.6  CL 96* 93*  CO2 21* 23  GLUCOSE 141* 213*  BUN 53* 48*  CREATININE 2.69* 2.43*  CALCIUM 8.4* 8.1*   Liver Function Tests Recent Labs    04/25/18 0949  AST 170*  ALT 145*  ALKPHOS 115  BILITOT 1.5*  PROT 5.5*  ALBUMIN 3.4*   No results for input(s): LIPASE, AMYLASE in the last  72 hours. Cardiac Enzymes No results for input(s): CKTOTAL, CKMB, CKMBINDEX, TROPONINI in the last 72 hours.  BNP: BNP (last 3 results) Recent Labs    04/25/18 0949  BNP 1,544.3*    ProBNP (last 3 results) No results for input(s): PROBNP in the last 8760 hours.   D-Dimer No results for input(s): DDIMER in the last 72 hours. Hemoglobin A1C No results for input(s): HGBA1C in the last 72 hours. Fasting Lipid Panel No results for input(s): CHOL, HDL, LDLCALC, TRIG, CHOLHDL, LDLDIRECT in the last 72 hours. Thyroid Function Tests Recent Labs    04/25/18 0949  TSH 8.246*    Other results:   Imaging    Dg Chest Port 1 View  Result Date: 04/25/2018 CLINICAL DATA:  PICC line placement EXAM: PORTABLE CHEST 1  VIEW COMPARISON:  07/22/2015 FINDINGS: Interval placement of RIGHT PICC line tip in distal SVC. Sternotomy wires and atrial clip noted. No pulmonary edema or pneumothorax. IMPRESSION: PICC line in good position. Electronically Signed   By: Suzy Bouchard M.D.   On: 04/25/2018 19:00   [place] Ekg Site Rite  Result Date: 04/25/2018 If Site Rite image not attached, placement could not be confirmed due to current cardiac rhythm.  Ir Paracentesis  Result Date: 04/25/2018 INDICATION: Patient with history of CLL, atrial fibrillation severe right ventricular heart failure and recurrent ascites. Patient underwent paracentesis yesterday in IR with 4.1 L of clear yellow fluid removed. He has since been admitted to the hospital due worsening peripheral edema, lower extremity wound and dyspnea. Request for repeat diagnostic and therapeutic paracentesis today in IR. EXAM: ULTRASOUND GUIDED DIAGNOSTIC AND THERAPEUTIC PARACENTESIS MEDICATIONS: 10 mL 1% lidocaine. COMPLICATIONS: None immediate. PROCEDURE: Informed written consent was obtained from the patient after a discussion of the risks, benefits and alternatives to treatment. A timeout was performed prior to the initiation of the procedure. Initial ultrasound scanning demonstrates a moderate amount of ascites within the left lower abdominal quadrant. The left lower abdomen was prepped and draped in the usual sterile fashion. 1% lidocaine was used for local anesthesia. Following this, a 19 gauge, 7-cm, Yueh catheter was introduced. An ultrasound image was saved for documentation purposes. The paracentesis was performed. The catheter was removed and a dressing was applied. The patient tolerated the procedure well without immediate post procedural complication. FINDINGS: A total of approximately 1.3 L of amber fluid was removed. Samples were sent to the laboratory as requested by the clinical team. IMPRESSION: Successful ultrasound-guided paracentesis yielding 1.3 liters of  peritoneal fluid. Read by Candiss Norse, PA-C Electronically Signed   By: Jacqulynn Cadet M.D.   On: 04/25/2018 15:48      Medications:     Scheduled Medications: . allopurinol  300 mg Oral Daily  . amiodarone  200 mg Oral BID  . apixaban  5 mg Oral BID  . iron polysaccharides  150 mg Oral Daily  . sertraline  200 mg Oral Daily  . sodium chloride flush  10-40 mL Intracatheter Q12H  . sodium chloride flush  3 mL Intravenous Q12H  . terbinafine  250 mg Oral Daily     Infusions: . sodium chloride    . furosemide (LASIX) infusion 15 mg/hr (04/26/18 0200)  . milrinone       PRN Medications:  sodium chloride, acetaminophen **OR** acetaminophen, HYDROcodone-acetaminophen, lidocaine, LORazepam, ondansetron **OR** ondansetron (ZOFRAN) IV, prochlorperazine, sodium chloride flush, sodium chloride flush  Patient Profile   Antonio Carrillo is a 78 y.o. male with h/o AF s/p several ablations,  aortic root aneurysm, s/p aortic root replacement with CABG 1, RV failure, CLL, and ascites.   Sent for admission from HF clinic 04/25/2018 with marked volume overload on exam, RLE wound, and marked ascites.   Assessment/Plan   1. Acute on chronic biventricular HF with diastolic dysfunction - [facility] 4/18 [facility] with normal LV function. RV severely dilated and mild RV dysfunction - Echo 2/19. Normal LVEF with significant RV failure - Echo 10/2017 LVEF read as 35-40%, but over-read by Dr. Haroldine Laws to be ~50-55%. RV down. Stable per Dr. Haroldine Laws - Repeat Echo pending.  - Volume status remains markedly elevated on exam. - Continue lasix 15 mg/hr with decent response and addition of milrinone.  - Coox 29% and CVP 22. Add milrinone 0.25 mcg/kg/min.  - May need repeat paracentesis once diuresed more.  - Holding spiro with elevated K on arrival.  - Underwent paracentesis with 4.1 L out 04/24/2018 and 1.3 L out on 04/25/2018.    2. PAF - S/p multiple ablations and MAZE. Failed DC-CV - EKG  shows Wide QRS rhythm with ?PVCs, difficult to see P waves. + RBBB - He is in NSR today.  - keep amiodarone 200 mg bid since we will be starting milrinone.   - Now off digoxin. Will not restart for now with AKI and adding milrinone.  - Continue Eliquis. Tolerating well   3. CLL - Per Onocology - Now s/p pelvic XRT  - Seems much improved after stopping Vanclexta - Hgb 7.7 this am. Denies overt bleeding. Per IM.  - Will order FOBT.   4. CAD and CABG x 1 - No s/s of ischemia.     5. Aortic root aneurysm s/p repair  - Stable.   6. RLE laceration - Appreciate WOC input. No fevers or chills.  - Will wrap legs as appropriate. Have to be cautious of wound on R foot with sutures in place.   7. Anemia - See above. No overt bleeding.   8. Ascites - Persists.  - S/p 2 paracenteses in past 2 days with 4.1 and 1.3 L out. Will likely need repeat next week with continued diuresis.  - Concern for cardiac cirrhosis but albumin remains 3.4 and recent CT without mention of cirrhotic changes. 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 Cytology from paracentesis pending.   9. Goals of Care - Had long discussion this am. Pt had a "feeling this day was coming". He understands that with his complicated co-morbidities, adjusting his medications is his only long-term option. He is not sure he would want to be considered for dialysis, if it comes to that.  He would like to remain a Full Code for now, but talk with his Brother, wife, and daughters about Goals of Care. He is not sure he would want heroic measures put in place if he were unlikely to make a full recovery and have good quality of life.  - He is agreeable to palliative care consult.   He remains markedly volume overloaded on exam, with end stage RV failure. Will add inotrope and continue diuresis. Will involve palliative care. His BP remains stable for now, but follow closely with low threshold to move to ICU if BP drops. GOC  discussion as above.   Medication concerns reviewed with patient and pharmacy team. Barriers identified: None at this time.   Length of Stay: 1  Annamaria Helling  04/26/2018, 7:40 AM  Advanced Heart Failure Team Pager [phone] (M-F; 7a - 4p)  Please contact [facility] for night-coverage after hours (4p -7a ) and weekends on [email or web address]  Patient seen with PA, agree with the above note.   He was admitted with marked volume overload.  Some diuresis overnight but co-ox markedly low at 30% today.  Creatinine marginally lower.   He is comfortable at rest.   On exam, JVP 16+, moderately distended abdomen, heart regular S1S2, 2+ edema to knees.   Patient is struggling with severe RV failure and marked volume overload in setting of cardiorenal syndrome.  He has cardiogenic ascites.  CVP 22, co-ox markedly low.  - Milrinone started at 0.25 mcg/kg/min to support RV.  Repeat co-ox in pm.  - Lasix gtt 15 mg/hr and will give metolazone 2.5 mg po x 1.  - Will likely need paracentesis again, maybe on Monday.  - Creatinine lower today, follow closely.  May improve with decongestion.  - Long-term prognosis is poor.  With RV failure, not candidate for LVAD and also would be poor HD candidate.  We discussed this today, we will do what we can with medications.  Will involve palliative care for goals of care.   Hyponatremia with Na 127 today.  Fluid restrict, will give tolvaptan if drops further.   He is in NSR today.  Continue amiodarone 200 mg bid while on milrinone. Continue Eliquis.    Will need to review echo done yesterday.   Loralie Champagne 04/26/2018 10:24 AM

## 2018-04-26 NOTE — Progress Notes (Signed)
Consult received for IV med administration planning. Discussed with Ronnette Juniper, RN. Recommended they connect  Lasix to CVP line, use stopcock to pause Lasix when checking intermittent CVP. Please contact [facility] with any additional questions.

## 2018-04-26 NOTE — Progress Notes (Signed)
Repeat coox 46%. Per Dr Aundra Dubin, will increase milrinone to 0.375 mcg/kg/min.  Antonio Shore, NP

## 2018-04-27 DIAGNOSIS — N179 Acute kidney failure, unspecified: Secondary | ICD-10-CM

## 2018-04-27 DIAGNOSIS — S99921A Unspecified injury of right foot, initial encounter: Secondary | ICD-10-CM

## 2018-04-27 DIAGNOSIS — I4891 Unspecified atrial fibrillation: Secondary | ICD-10-CM

## 2018-04-27 LAB — BASIC METABOLIC PANEL
Anion gap: 12 (ref 5–15)
BUN: 46 mg/dL — ABNORMAL HIGH (ref 8–23)
CO2: 24 mmol/L (ref 22–32)
Calcium: 7.8 mg/dL — ABNORMAL LOW (ref 8.9–10.3)
Chloride: 89 mmol/L — ABNORMAL LOW (ref 98–111)
Creatinine, Ser: 2.09 mg/dL — ABNORMAL HIGH (ref 0.61–1.24)
GFR calc Af Amer: 34 mL/min — ABNORMAL LOW (ref >60)
GFR calc non Af Amer: 29 mL/min — ABNORMAL LOW (ref >60)
GLUCOSE: 204 mg/dL — AB (ref 70–99)
Potassium: 4.1 mmol/L (ref 3.5–5.1)
Sodium: 125 mmol/L — ABNORMAL LOW (ref 135–145)

## 2018-04-27 LAB — FOLATE: Folate: 11 ng/mL (ref >5.9)

## 2018-04-27 LAB — CBC
HCT: 22.9 % — ABNORMAL LOW (ref 39.0–52.0)
Hemoglobin: 7.1 g/dL — ABNORMAL LOW (ref 13.0–17.0)
MCH: 28 pg (ref 26.0–34.0)
MCHC: 31 g/dL (ref 30.0–36.0)
MCV: 90.2 fL (ref 80.0–100.0)
Platelets: 200 10*3/uL (ref 150–400)
RBC: 2.54 MIL/uL — ABNORMAL LOW (ref 4.22–5.81)
RDW: 15.9 % — AB (ref 11.5–15.5)
WBC: 13.8 10*3/uL — AB (ref 4.0–10.5)
nRBC: 0.3 % — ABNORMAL HIGH (ref 0.0–0.2)

## 2018-04-27 LAB — COOXEMETRY PANEL
CARBOXYHEMOGLOBIN: 1.6 % — AB (ref 0.5–1.5)
Methemoglobin: 1.5 % (ref 0.0–1.5)
O2 Saturation: 46.1 %
Total hemoglobin: 7.4 g/dL — ABNORMAL LOW (ref 12.0–16.0)

## 2018-04-27 LAB — VITAMIN B12: Vitamin B-12: 1637 pg/mL — ABNORMAL HIGH (ref 180–914)

## 2018-04-27 LAB — T4, FREE: Free T4: 1.11 ng/dL (ref 0.82–1.77)

## 2018-04-27 MED ORDER — SODIUM CHLORIDE 0.9 % IV SOLN
510.0000 mg | Freq: Once | INTRAVENOUS | Status: AC
  Administered 2018-04-27: 510 mg via INTRAVENOUS
  Filled 2018-04-27: qty 17

## 2018-04-27 MED ORDER — TOLVAPTAN 15 MG PO TABS
15.0000 mg | ORAL_TABLET | Freq: Once | ORAL | Status: AC
  Administered 2018-04-27: 15 mg via ORAL
  Filled 2018-04-27: qty 1

## 2018-04-27 MED ORDER — METOLAZONE 5 MG PO TABS
5.0000 mg | ORAL_TABLET | Freq: Once | ORAL | Status: AC
  Administered 2018-04-27: 5 mg via ORAL
  Filled 2018-04-27: qty 1

## 2018-04-27 MED ORDER — AMIODARONE HCL 200 MG PO TABS
200.0000 mg | ORAL_TABLET | Freq: Two times a day (BID) | ORAL | Status: DC
  Administered 2018-04-27 – 2018-05-10 (×27): 200 mg via ORAL
  Filled 2018-04-27 (×27): qty 1

## 2018-04-27 MED ORDER — HYDROCODONE-ACETAMINOPHEN 10-325 MG PO TABS: 1.0000 | ORAL_TABLET | Freq: Once | ORAL | Status: AC

## 2018-04-27 NOTE — Progress Notes (Signed)
Patient ID: Antonio Carrillo, male   DOB: January 01, 1941, 78 y.o.   MRN: [id number]     Advanced Heart Failure Rounding Note  PCP-Cardiologist: No primary care provider on file.   Subjective:    Admitted from clinic 04/25/2018 with marked volume overloaded and abdominal distention. PICC placed.   Co-ox 29.7% => 46% today on milrinone 0.375.   Cr 2.69 -> 2.43 -> 2.09 with diuresis. Sluggish diuresis yesterday with Lasix gtt at 15 mg/hr.  CVP remains around 20.   Hgb 7.1 today, no overt bleeding.  Transferrin saturation 4%.   Echo performed 04/25/2018: LV EF 55-60%, D-shaped interventricular septum with RV enlargement and dysfunction, severe TR.   Objective:   Weight Range: 101.5 kg Body mass index is 31.21 kg/m.   Vital Signs:   Temp:  [97.8 F (36.6 C)-98.2 F (36.8 C)] 97.9 F (36.6 C) (01/25 0840) Pulse Rate:  [55-75] 55 (01/25 0840) Resp:  [15-20] 17 (01/25 0840) BP: (112-134)/(56-73) 114/56 (01/25 0840) SpO2:  [93 %-98 %] 93 % (01/25 0840) Weight:  [101.5 kg] 101.5 kg (01/25 0427) Last BM Date: 04/26/18  Weight change: Filed Weights   04/25/18 1029 04/26/18 0327 04/27/18 0427  Weight: 101.3 kg 100.1 kg 101.5 kg   Intake/Output:   Intake/Output Summary (Last 24 hours) at 04/27/2018 0856 Last data filed at 04/27/2018 0427 Gross per 24 hour  Intake 1095.3 ml  Output 1540 ml  Net -444.7 ml    Physical Exam    General: NAD Neck: JVP 16+ cm, no thyromegaly or thyroid nodule.  Lungs: Decreased BS at bases.  CV: Nondisplaced PMI.  Heart regular S1/S2, no S3/S4, 2/6 HSM LLSB.  2+ edema to thighs.   Abdomen: Soft, nontender, no hepatosplenomegaly, moderate distention but not tight.  Skin: Intact without lesions or rashes.  Neurologic: Alert and oriented x 3.  Psych: Normal affect. Extremities: No clubbing or cyanosis. Right foot wrapped.  HEENT: Normal.    Telemetry   NSR 60-70s, personally reviewed.  EKG    Pending.  Labs    CBC Recent Labs    04/26/18 0309  04/27/18 0243  WBC 13.7* 13.8*  NEUTROABS 10.8*  --   HGB 7.7* 7.1*  HCT 25.5* 22.9*  MCV 92.4 90.2  PLT 228 242   Basic Metabolic Panel Recent Labs    04/26/18 0309 04/27/18 0243  NA 127* 125*  K 4.6 4.1  CL 93* 89*  CO2 23 24  GLUCOSE 213* 204*  BUN 48* 46*  CREATININE 2.43* 2.09*  CALCIUM 8.1* 7.8*   Liver Function Tests Recent Labs    04/25/18 0949  AST 170*  ALT 145*  ALKPHOS 115  BILITOT 1.5*  PROT 5.5*  ALBUMIN 3.4*   No results for input(s): LIPASE, AMYLASE in the last 72 hours. Cardiac Enzymes No results for input(s): CKTOTAL, CKMB, CKMBINDEX, TROPONINI in the last 72 hours.  BNP: BNP (last 3 results) Recent Labs    04/25/18 0949  BNP 1,544.3*    ProBNP (last 3 results) No results for input(s): PROBNP in the last 8760 hours.   D-Dimer No results for input(s): DDIMER in the last 72 hours. Hemoglobin A1C No results for input(s): HGBA1C in the last 72 hours. Fasting Lipid Panel No results for input(s): CHOL, HDL, LDLCALC, TRIG, CHOLHDL, LDLDIRECT in the last 72 hours. Thyroid Function Tests Recent Labs    04/25/18 0949  TSH 8.246*    Other results:   Imaging    No results found.   Medications:  Scheduled Medications: . allopurinol  100 mg Oral Daily  . amiodarone  200 mg Oral BID  . apixaban  5 mg Oral BID  . doxycycline  100 mg Oral Q12H  . iron polysaccharides  150 mg Oral Daily  . metolazone  5 mg Oral Once  . sertraline  200 mg Oral Daily  . sodium chloride flush  10-40 mL Intracatheter Q12H  . sodium chloride flush  3 mL Intravenous Q12H  . terbinafine  250 mg Oral Daily  . tolvaptan  15 mg Oral Once    Infusions: . sodium chloride    . ferumoxytol    . furosemide (LASIX) infusion 15 mg/hr (04/27/18 0029)  . milrinone 0.375 mcg/kg/min (04/27/18 0029)    PRN Medications: sodium chloride, acetaminophen **OR** acetaminophen, HYDROcodone-acetaminophen, lidocaine, LORazepam, ondansetron **OR** ondansetron  (ZOFRAN) IV, prochlorperazine, sodium chloride flush, sodium chloride flush  Patient Profile   [name] is a 78 y.o. male with h/o AF s/p several ablations, aortic root aneurysm, s/p aortic root replacement with CABG 1, RV failure, CLL, and ascites.   Sent for admission from HF clinic 04/25/2018 with marked volume overload on exam, RLE wound, and marked ascites.   Assessment/Plan   1. 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 [facility]MRI 4/18 [facility] with normal LV function. RV severely dilated and mild RV dysfunction.  Echo this admission with LV EF 55-60%, RV dilated and dysfunctional, severe TR, D-shaped septum. He remains markedly volume overloaded on exam with low output (co-ox better but still 46%).  Diuresis sluggish on Lasix gtt at 15 mg/hr but creatinine down from 2.43 => 2.09.  He is on milrinone 0.375 for RV support. BP remains stable.  - Increase milrinone to 0.5.  - Increase Lasix to 20 mg/hr and will give dose of metolazone 5 mg x 1.  - Tolvaptan 15 mg x 1 with Na down to 125.   - May need repeat paracentesis this admission.  - Long-term prognosis is poor.  With RV failure, not candidate for LVAD and also would be poor HD candidate.  We discussed again, we will do what we can with medications.  Will involve palliative care for goals of care => he did not want to have this discussion yesterday.  2. PAF: S/p multiple ablations and MAZE. Suspect NSR, rate in 60s.   - keep amiodarone 200 mg bid since he is on milrinone.   - Now off digoxin. Will not restart for now with AKI and adding milrinone.  - Continue Eliquis.  3. CLL: Per Oncology.  Now s/p pelvic XRT  4. CAD and CABG x 1: No chest pain. No ASA given apixaban use.     5. Aortic root aneurysm s/p repair  6. RLE laceration: Appreciate WOC input. No fevers or chills.  - Will wrap legs as appropriate. Have to be cautious of wound on R foot with sutures in place.    7. Anemia: No overt bleeding, remains on apixaban.  Hgb 7.1 today.  With marked volume overload, would not transfuse until Hgb < 7.  Transferrin saturation low. - Will give feraheme.  - Guaiac stool.  8. Ascites: Persists.  S/p 2 paracenteses recently with 4.1 and 1.3 L out. Will likely need repeat next week with continued diuresis. 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 Cytology from paracentesis pending.  9. Hyponatremia: Sodium down to 125. - Tolvaptan 15 mg x 1 today  and fluid restrict.   Loralie Champagne 04/27/2018 8:56 AM

## 2018-04-27 NOTE — Progress Notes (Signed)
PROGRESS NOTE    Antonio Carrillo   WNU:[id number]  DOB: 04/19/1940  DOA: 04/25/2018 PCP: Patient, No Pcp Per   Brief Narrative:  Antonio Carrillo is a 78 y.o. male with medical history significant of CLL, hypertension, hyperlipidemia, paroxysmal A. 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   Subjective: Sleepy when I evaluated him. No complaints.  Assessment & Plan:   Principal Problem:   Acute on chronic combined systolic and diastolic CHF with hyponatremia, ascites, pedal edema - management by cardiology - palliative care discussions per cardiology - receiving Samsca today along with Lasix and Milrinone  Active Problems: PAF - management per cardiology - on Amiodarone 200 BID and Eliquis  AKI (acute kidney injury)  - baseline < 1- now ~ 2.5 - likely cardiorenal  Hyperkalemia - improved  Left foot wound- leukocytosis - received sutures by Ortho on 1/15 at [facility] along with Keflex - was due to see them on this past Wed but did not make it - weeping profusely likely due to pedal edema- see pictures below - I spoke with ortho, Dr Doreatha Martin, in regards to the foot he evaluated the patient on 1/24- he recommended and antibiotic and we decided on Doxycyline- he has removed the sutures and has ordered wet to dry dressing - foot looks better today in regards to erythema and swelling- see picture below  Anxiety/ depression - Zoloft daily, Ativan PRN  Anemia, chronic - baseline 8-9- 7.7 today - anemia panel shows low normal Ferritin and low Iron saturation with normal IBC - cardiology has given Feraheme  - on Niferex daily as oupt - continue    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 - follows a [facility] - note from 12/30 /19 recommends observation only  after his treatment and repeat CT in 3 months  Gout - Allopurinol  Elevated TSH - likely sick euthyroid-  Free T 4 is normal   Time spent in minutes: 35 DVT prophylaxis: Eliquis Code Status: Full code Family Communication:  Disposition Plan: to be determined Consultants:   Heart failure team   ortho Procedures:     Antimicrobials:  Anti-infectives (From admission, onward)   Start     Dose/Rate Route Frequency Ordered Stop   04/26/18 1330  doxycycline (VIBRA-TABS) tablet 100 mg     100 mg Oral Every 12 hours 04/26/18 1242     04/25/18 1300  terbinafine (LAMISIL) tablet 250 mg     250 mg Oral Daily 04/25/18 1143         Objective: Vitals:   04/27/18 0009 04/27/18 0420 04/27/18 0427 04/27/18 0840  BP: (!) 134/59 112/68  (!) 114/56  Pulse: 75   (!) 55  Resp: 15 20  17   Temp: 98.2 F (36.8 C) 97.8 F (36.6 C)  97.9 F (36.6 C)  TempSrc: Oral Oral  Oral  SpO2: 96% 98%  93%  Weight:   101.5 kg   Height:        Intake/Output Summary (Last 24 hours) at 04/27/2018 1203 Last data filed at 04/27/2018 0347 Gross per 24 hour  Intake 1215.3 ml  Output 1590 ml  Net -374.7 ml   Filed Weights   04/25/18 1029 04/26/18 0327 04/27/18 0427  Weight: 101.3 kg 100.1 kg 101.5 kg    Examination: General exam: Appears comfortable  HEENT: PERRLA, oral mucosa moist, no sclera icterus or thrush Respiratory system: faint crackles Cardiovascular system: S1 & S2 heard, RRR.   Gastrointestinal system: Abdomen soft, non-tender,moderately distended. Normal bowel sounds. Extremities: left leg in Unna boot- right noted below- edema has improved significantly Skin: see below-             Data Reviewed: I have personally reviewed following labs and imaging studies  CBC: Recent Labs  Lab 04/25/18 0949 04/25/18 2008 04/26/18 0309 04/27/18 0243  WBC 16.9*  --  13.7* 13.8*  NEUTROABS  --   --  10.8*  --   HGB 8.3* 7.4* 7.7* 7.1*  HCT 26.5* 24.1* 25.5* 22.9*  MCV 91.4  --   92.4 90.2  PLT 268  --  228 485   Basic Metabolic Panel: Recent Labs  Lab 04/25/18 0949 04/26/18 0309 04/27/18 0243  NA 129* 127* 125*  K 5.5* 4.6 4.1  CL 96* 93* 89*  CO2 21* 23 24  GLUCOSE 141* 213* 204*  BUN 53* 48* 46*  CREATININE 2.69* 2.43* 2.09*  CALCIUM 8.4* 8.1* 7.8*   GFR: Estimated Creatinine Clearance: 34.8 mL/min (A) (by C-G formula based on SCr of 2.09 mg/dL (H)). Liver Function Tests: Recent Labs  Lab 04/25/18 0949  AST 170*  ALT 145*  ALKPHOS 115  BILITOT 1.5*  PROT 5.5*  ALBUMIN 3.4*   No results for input(s): LIPASE, AMYLASE in the last 168 hours. No results for input(s): AMMONIA in the last 168 hours. Coagulation Profile: No results for input(s): INR, PROTIME in the last 168 hours. Cardiac Enzymes: No results for input(s): CKTOTAL, CKMB, CKMBINDEX, TROPONINI in the last 168 hours. BNP (last 3 results) No results for input(s): PROBNP in the last 8760 hours. HbA1C: No results for input(s): HGBA1C in the last 72 hours. CBG: No results for input(s): GLUCAP in the last 168 hours. Lipid Profile: No results for input(s): CHOL, HDL, LDLCALC, TRIG, CHOLHDL, LDLDIRECT in the last 72 hours. Thyroid Function Tests: Recent Labs    04/25/18 0949 04/27/18 0243  TSH 8.246*  --   FREET4  --  1.11   Anemia Panel: Recent Labs    04/26/18 1300 04/27/18 0243  VITAMINB12  --  1,637*  FOLATE  --  11.0  FERRITIN 30  --   TIBC 298  --   IRON 11*  --   RETICCTPCT 4.3*  --    Urine analysis: No results found for: COLORURINE, APPEARANCEUR, LABSPEC, PHURINE, GLUCOSEU, HGBUR, BILIRUBINUR, KETONESUR, PROTEINUR, UROBILINOGEN, NITRITE, LEUKOCYTESUR Sepsis Labs: @LABRCNTIP (procalcitonin:4,lacticidven:4) ) Recent Results (from the past 240 hour(s))  Culture, body fluid-bottle     Status: None (Preliminary result)   Collection Time: 04/25/18  3:28 PM  Result Value Ref Range Status   Specimen Description FLUID PERITONEAL  Final   Special Requests BOTTLES  DRAWN AEROBIC AND ANAEROBIC  Final   Culture   Final    NO GROWTH 2 DAYS Performed at [facility], [place] [address], [place], [place] [address]    Report Status PENDING  Incomplete  Gram stain     Status: None   Collection Time: 04/25/18  3:28 PM  Result Value Ref Range Status   Specimen Description FLUID PERITONEAL  Final   Special Requests NONE  Final   Gram Stain   Final    WBC PRESENT,BOTH PMN AND MONONUCLEAR NO ORGANISMS SEEN CYTOSPIN SMEAR Performed at [facility], [address], [place], [place] [address]    Report Status 04/25/2018  FINAL  Final         Radiology Studies: Dg Chest Port 1 View  Result Date: 04/25/2018 CLINICAL DATA:  PICC line placement EXAM: PORTABLE CHEST 1 VIEW COMPARISON:  07/22/2015 FINDINGS: Interval placement of RIGHT PICC line tip in distal SVC. Sternotomy wires and atrial clip noted. No pulmonary edema or pneumothorax. IMPRESSION: PICC line in good position. Electronically Signed   By: Suzy Bouchard M.D.   On: 04/25/2018 19:00   Ir Paracentesis  Result Date: 04/25/2018 INDICATION: Patient with history of CLL, atrial fibrillation severe right ventricular heart failure and recurrent ascites. Patient underwent paracentesis yesterday in IR with 4.1 L of clear yellow fluid removed. He has since been admitted to the hospital due worsening peripheral edema, lower extremity wound and dyspnea. Request for repeat diagnostic and therapeutic paracentesis today in IR. EXAM: ULTRASOUND GUIDED DIAGNOSTIC AND THERAPEUTIC PARACENTESIS MEDICATIONS: 10 mL 1% lidocaine. COMPLICATIONS: None immediate. PROCEDURE: Informed written consent was obtained from the patient after a discussion of the risks, benefits and alternatives to treatment. A timeout was performed prior to the initiation of the procedure. Initial ultrasound scanning demonstrates a moderate amount of ascites within the left lower abdominal quadrant. The left lower abdomen was prepped and draped  in the usual sterile fashion. 1% lidocaine was used for local anesthesia. Following this, a 19 gauge, 7-cm, Yueh catheter was introduced. An ultrasound image was saved for documentation purposes. The paracentesis was performed. The catheter was removed and a dressing was applied. The patient tolerated the procedure well without immediate post procedural complication. FINDINGS: A total of approximately 1.3 L of amber fluid was removed. Samples were sent to the laboratory as requested by the clinical team. IMPRESSION: Successful ultrasound-guided paracentesis yielding 1.3 liters of peritoneal fluid. Read by Candiss Norse, PA-C Electronically Signed   By: Jacqulynn Cadet M.D.   On: 04/25/2018 15:48      Scheduled Meds: . allopurinol  100 mg Oral Daily  . amiodarone  200 mg Oral BID  . apixaban  5 mg Oral BID  . doxycycline  100 mg Oral Q12H  . iron polysaccharides  150 mg Oral Daily  . sertraline  200 mg Oral Daily  . sodium chloride flush  10-40 mL Intracatheter Q12H  . sodium chloride flush  3 mL Intravenous Q12H  . terbinafine  250 mg Oral Daily   Continuous Infusions: . sodium chloride    . ferumoxytol    . furosemide (LASIX) infusion 20 mg/hr (04/27/18 0907)  . milrinone 0.5 mcg/kg/min (04/27/18 0903)     LOS: 2 days      Debbe Odea, MD [facility] Pager: [email or web address] Password [facility] 04/27/2018, 12:03 PM

## 2018-04-27 NOTE — Progress Notes (Signed)
Pt with increasing foot pain from wound compared to before sutures were removed. Exacerbated by elevation and relieved by having leg in dependant position. Gave extra Vicodin around 0250 with minimal relief. Will pass along to day RN.

## 2018-04-27 NOTE — Progress Notes (Signed)
IV iron was administered late. Order for IV team to enter new PIV, patient has 2 lumen PICC with non-compatible medications, IV team refused to enter new PIV, lasix drip put on hold to administer IV iron.

## 2018-04-27 NOTE — Progress Notes (Signed)
Orthopedic Tech Progress Note Patient Details:  Antonio Carrillo [birth year] [id number]  Ortho Devices Type of Ortho Device: Haematologist Ortho Device/Splint Location: LLE Ortho Device/Splint Interventions: Adjustment, Application, Ordered   Post Interventions Patient Tolerated: Well Instructions Provided: Care of device, Adjustment of device   [name] 04/27/2018, 10:21 AM

## 2018-04-28 LAB — BASIC METABOLIC PANEL
Anion gap: 17 — ABNORMAL HIGH (ref 5–15)
Anion gap: 14 (ref 5–15)
BUN: 43 mg/dL — ABNORMAL HIGH (ref 8–23)
BUN: 41 mg/dL — ABNORMAL HIGH (ref 8–23)
CO2: 24 mmol/L (ref 22–32)
CO2: 27 mmol/L (ref 22–32)
Calcium: 7.7 mg/dL — ABNORMAL LOW (ref 8.9–10.3)
Calcium: 8 mg/dL — ABNORMAL LOW (ref 8.9–10.3)
Chloride: 84 mmol/L — ABNORMAL LOW (ref 98–111)
Chloride: 82 mmol/L — ABNORMAL LOW (ref 98–111)
Creatinine, Ser: 1.88 mg/dL — ABNORMAL HIGH (ref 0.61–1.24)
Creatinine, Ser: 1.78 mg/dL — ABNORMAL HIGH (ref 0.61–1.24)
GFR calc Af Amer: 39 mL/min — ABNORMAL LOW (ref >60)
GFR calc Af Amer: 41 mL/min — ABNORMAL LOW (ref >60)
GFR calc non Af Amer: 33 mL/min — ABNORMAL LOW (ref >60)
GFR calc non Af Amer: 35 mL/min — ABNORMAL LOW (ref >60)
Glucose, Bld: 201 mg/dL — ABNORMAL HIGH (ref 70–99)
Glucose, Bld: 257 mg/dL — ABNORMAL HIGH (ref 70–99)
Potassium: 2.8 mmol/L — ABNORMAL LOW (ref 3.5–5.1)
Potassium: 2.9 mmol/L — ABNORMAL LOW (ref 3.5–5.1)
Sodium: 125 mmol/L — ABNORMAL LOW (ref 135–145)
Sodium: 123 mmol/L — ABNORMAL LOW (ref 135–145)

## 2018-04-28 LAB — COOXEMETRY PANEL
Carboxyhemoglobin: 0.2 % — ABNORMAL LOW (ref 0.5–1.5)
METHEMOGLOBIN: 1.5 % (ref 0.0–1.5)
O2 Saturation: 47.4 %
Total hemoglobin: 7.2 g/dL — ABNORMAL LOW (ref 12.0–16.0)

## 2018-04-28 LAB — MAGNESIUM: Magnesium: 2.3 mg/dL (ref 1.7–2.4)

## 2018-04-28 MED ORDER — WHITE PETROLATUM EX OINT
TOPICAL_OINTMENT | CUTANEOUS | Status: AC
  Filled 2018-04-28: qty 28.35

## 2018-04-28 MED ORDER — TOLVAPTAN 15 MG PO TABS
30.0000 mg | ORAL_TABLET | Freq: Once | ORAL | Status: AC
  Administered 2018-04-28: 30 mg via ORAL
  Filled 2018-04-28: qty 2

## 2018-04-28 MED ORDER — SPIRONOLACTONE 25 MG PO TABS
25.0000 mg | ORAL_TABLET | Freq: Two times a day (BID) | ORAL | Status: DC
  Administered 2018-04-28 – 2018-04-30 (×5): 25 mg via ORAL
  Filled 2018-04-28 (×5): qty 1

## 2018-04-28 MED ORDER — ALTEPLASE 2 MG IJ SOLR
2.0000 mg | Freq: Once | INTRAMUSCULAR | Status: AC
  Administered 2018-04-28: 2 mg

## 2018-04-28 MED ORDER — POTASSIUM CHLORIDE CRYS ER 20 MEQ PO TBCR
40.0000 meq | EXTENDED_RELEASE_TABLET | ORAL | Status: AC
  Administered 2018-04-28 (×2): 40 meq via ORAL
  Filled 2018-04-28 (×2): qty 2

## 2018-04-28 MED ORDER — METOLAZONE 5 MG PO TABS
5.0000 mg | ORAL_TABLET | Freq: Every day | ORAL | Status: DC
  Administered 2018-04-28: 5 mg via ORAL
  Filled 2018-04-28: qty 1

## 2018-04-28 MED ORDER — ENSURE ENLIVE PO LIQD
237.0000 mL | Freq: Two times a day (BID) | ORAL | Status: DC
  Administered 2018-04-28 – 2018-05-15 (×26): 237 mL via ORAL

## 2018-04-28 NOTE — Progress Notes (Signed)
Patient ID: Antonio Carrillo, male   DOB: 10-18-1940, 78 y.o.   MRN: [id number]     Advanced Heart Failure Rounding Note  PCP-Cardiologist: No primary care provider on file.   Subjective:    Admitted from clinic 04/25/2018 with marked volume overloaded and abdominal distention. PICC placed.   Milrinone increased to 0.5 yesterday due to advanced RV failure. Co-ox 29.7% -> 46% -> 47%.   Lasix drip increased to 20/hr yesterday. Given a dose of metolazone and tolvaptan. Weight down 2 pounds.   Cr 2.69 -> 2.43 -> 2.09 -> 1.88. Sodium 128. K 2.8. CVP 20-21  Feels weak. No SOB, orthopnea or PND. No fevers. No appetite. No BM for several days.    Echo performed 04/25/2018: LV EF 55-60%, D-shaped interventricular septum with RV enlargement and dysfunction, severe TR.   Objective:   Weight Range: 100.6 kg Body mass index is 30.92 kg/m.   Vital Signs:   Temp:  [97.6 F (36.4 C)-97.8 F (36.6 C)] 97.8 F (36.6 C) (01/26 1224) Pulse Rate:  [60-70] 66 (01/26 1224) Resp:  [14-28] 19 (01/26 1224) BP: (99-132)/(50-69) 103/61 (01/26 1224) SpO2:  [97 %-100 %] 100 % (01/26 1224) Weight:  [100.6 kg] 100.6 kg (01/26 0515) Last BM Date: 04/25/18  Weight change: Filed Weights   04/27/18 0427 04/28/18 0245 04/28/18 0515  Weight: 101.5 kg 100.6 kg 100.6 kg   Intake/Output:   Intake/Output Summary (Last 24 hours) at 04/28/2018 1350 Last data filed at 04/28/2018 1041 Gross per 24 hour  Intake 749 ml  Output 2885 ml  Net -2136 ml    Physical Exam    General:  Weak appearing. No resp difficulty HEENT: normal Neck: supple. JVP to ear Carotids 2+ bilat; no bruits. No lymphadenopathy or thryomegaly appreciated. Cor: PMI nondisplaced. Regular rate & rhythm. 2/6 TR + RV lift Lungs: clear Abdomen: soft, nontender, + distended. No hepatosplenomegaly. No bruits or masses. Good bowel sounds. Extremities: no cyanosis, clubbing, rash, 3+ edema into thigh  R foot wrapped UNNA boot on L Neuro: alert &  orientedx3, cranial nerves grossly intact. moves all 4 extremities w/o difficulty. Affect pleasant   Telemetry   NSR with periods of junctional rhythm 60-70s, personally reviewed.   Labs    CBC Recent Labs    04/26/18 0309 04/27/18 0243  WBC 13.7* 13.8*  NEUTROABS 10.8*  --   HGB 7.7* 7.1*  HCT 25.5* 22.9*  MCV 92.4 90.2  PLT 228 240   Basic Metabolic Panel Recent Labs    04/27/18 0243 04/28/18 0424 04/28/18 0857  NA 125* 125*  --   K 4.1 2.8*  --   CL 89* 84*  --   CO2 24 24  --   GLUCOSE 204* 201*  --   BUN 46* 43*  --   CREATININE 2.09* 1.88*  --   CALCIUM 7.8* 7.7*  --   MG  --   --  2.3   Liver Function Tests No results for input(s): AST, ALT, ALKPHOS, BILITOT, PROT, ALBUMIN in the last 72 hours. No results for input(s): LIPASE, AMYLASE in the last 72 hours. Cardiac Enzymes No results for input(s): CKTOTAL, CKMB, CKMBINDEX, TROPONINI in the last 72 hours.  BNP: BNP (last 3 results) Recent Labs    04/25/18 0949  BNP 1,544.3*    ProBNP (last 3 results) No results for input(s): PROBNP in the last 8760 hours.   D-Dimer No results for input(s): DDIMER in the last 72 hours. Hemoglobin A1C No results for  input(s): HGBA1C in the last 72 hours. Fasting Lipid Panel No results for input(s): CHOL, HDL, LDLCALC, TRIG, CHOLHDL, LDLDIRECT in the last 72 hours. Thyroid Function Tests No results for input(s): TSH, T4TOTAL, T3FREE, THYROIDAB in the last 72 hours.  Invalid input(s): FREET3  Other results:   Imaging    No results found.   Medications:     Scheduled Medications: . allopurinol  100 mg Oral Daily  . amiodarone  200 mg Oral BID  . apixaban  5 mg Oral BID  . doxycycline  100 mg Oral Q12H  . iron polysaccharides  150 mg Oral Daily  . sertraline  200 mg Oral Daily  . sodium chloride flush  10-40 mL Intracatheter Q12H  . sodium chloride flush  3 mL Intravenous Q12H  . terbinafine  250 mg Oral Daily    Infusions: . sodium chloride     . furosemide (LASIX) infusion 20 mg/hr (04/28/18 0600)  . milrinone 0.5 mcg/kg/min (04/28/18 1041)    PRN Medications: sodium chloride, acetaminophen **OR** acetaminophen, HYDROcodone-acetaminophen, lidocaine, LORazepam, ondansetron **OR** ondansetron (ZOFRAN) IV, prochlorperazine, sodium chloride flush, sodium chloride flush  Patient Profile   Antonio Carrillo is a 78 y.o. male with h/o AF s/p several ablations, aortic root aneurysm, s/p aortic root replacement with CABG 1, RV failure, CLL, and ascites.   Sent for admission from HF clinic 04/25/2018 with marked volume overload on exam, RLE wound, and marked ascites.   Assessment/Plan   1. 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 [facility]MRI 4/18 at [facility] with normal LV function. RV severely dilated and mild RV dysfunction.  Echo this admission with LV EF 55-60%, RV dilated and dysfunctional, severe TR, D-shaped septum. He remains markedly volume overloaded on exam with low output (co-ox better but still 47%).  CVP 20-21 - Continues with low co-ox in the setting of end-stage RV failure despite milrinone 0.5 - Diuresis picking up a bit but remains sluggish - Continue Lasix at 20 mg/hr. Metolazone 5mg  daily (can increase when hypokalemia improves) - Add spiro 25 bid - Tolvaptan 30 mg x 1 with Na down to 125.   - May need repeat paracentesis this admission.  - Long-term prognosis is poor.  With RV failure, not candidate for LVAD and also would be poor HD candidate. Palliative Care consult pending  - I had long discussion with him and his family today. He had many questions about dying and what it would look like. We discussed this in detail. For now he is hopeful that he may get some recovery. He was very satisfied with his QOL recently and would like to get back to that. We will continue inotrope support and diuresis. Hopefully co-ox will improved with volume removal. We discussed  code status. And they will discuss. Will likely not want CPR but short-term intubation would be ok.  2. PAF: S/p multiple ablations and MAZE. Suspect NSR, rate in 60s.   - keep amiodarone 200 mg bid since he is on milrinone.   - Now off digoxin. Will not restart for now with AK - Continue Eliquis.  3. CLL: Per Oncology.  Now s/p pelvic XRT  4. CAD and CABG x 1: No chest pain. No ASA given apixaban use.     5. Aortic root aneurysm s/p repair  6. RLE laceration: Appreciate WOC and Ortho input. No fevers or chills.  - Management per Ortho 7. Iron deficiency Anemia: No overt bleeding,  remains on apixaban.  Hgb 7.2 today.  With marked volume overload, would not transfuse until Hgb < 7.  Transferrin saturation low. - Received feraheme 11/25.  8. Ascites: Persists.  S/p 2 paracenteses recently with 4.1 and 1.3 L out. Will likely need repeat next week with continued diuresis. 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 Cytology from paracentesis has repeatedly been negative for malignancy 9. Hyponatremia: Sodium unchanged ar 125. - Will give tolvaptan 30 mg today  - Free water restrict.  10. Hypokalemia - supp. Add spiro.  Glori Bickers MD 04/28/2018 1:50 PM

## 2018-04-28 NOTE — Plan of Care (Signed)
Care plans reviewed and patient is progressing.  

## 2018-04-28 NOTE — Progress Notes (Signed)
Left peripheral IV site redressed x 2 due to bloody leaking at the site.  D/c'd IV and cleaned site.

## 2018-04-28 NOTE — Progress Notes (Signed)
PROGRESS NOTE    Antonio Carrillo   ZHY:[id number]  DOB: January 31, 1941  DOA: 04/25/2018 PCP: Patient, No Pcp Per   Brief Narrative:  Antonio Carrillo is a 78 y.o. male with medical history significant of CLL, hypertension, hyperlipidemia, paroxysmal A. 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   Subjective: No new complaints. Abdominal swelling not improved  Assessment & Plan:   Principal Problem:   Acute on chronic combined systolic and diastolic CHF with hyponatremia, ascites, pedal edema - management by cardiology - palliative care discussions per cardiology  Active Problems: PAF - management per cardiology - on Amiodarone 200 BID and Eliquis  AKI (acute kidney injury)  - baseline < 1- on admission ~ 2.5 - likely cardiorenal- improving  Hyperkalemia - improved  Left foot wound- leukocytosis - received sutures by Ortho on 1/15 at [facility] along with Keflex - was due to see them on this past Wed but did not make it - weeping profusely likely due to pedal edema- see pictures below - I spoke with ortho, Dr Doreatha Martin, in regards to the foot he evaluated the patient on 1/24- he recommended and antibiotic and we decided on Doxycyline- he has removed the sutures and has ordered wet to dry dressing -  erythema and swelling improving  Anxiety/ depression - Zoloft daily, Ativan PRN  Anemia, chronic - baseline 8-9- 7.7 today - anemia panel shows low normal Ferritin and low Iron saturation with normal IBC - cardiology has given Feraheme  - on Niferex daily as oupt - continue    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 - follows a [facility] - note from 12/30 /19 recommends observation only after his treatment and repeat CT in 3 months  Gout - Allopurinol  Elevated TSH - likely sick  euthyroid-  Free T 4 is normal   Time spent in minutes: 35 DVT prophylaxis: Eliquis Code Status: Full code Family Communication:  Disposition Plan: to be determined Consultants:   Heart failure team   ortho Procedures:     Antimicrobials:  Anti-infectives (From admission, onward)   Start     Dose/Rate Route Frequency Ordered Stop   04/26/18 1330  doxycycline (VIBRA-TABS) tablet 100 mg     100 mg Oral Every 12 hours 04/26/18 1242     04/25/18 1300  terbinafine (LAMISIL) tablet 250 mg     250 mg Oral Daily 04/25/18 1143         Objective: Vitals:   04/28/18 0245 04/28/18 0515 04/28/18 0829 04/28/18 1224  BP:  (!) 99/52 (!) 104/50 103/61  Pulse:  61 70 66  Resp: 16 15 16 19   Temp:  97.6 F (36.4 C) 97.6 F (36.4 C) 97.8 F (36.6 C)  TempSrc:  Oral Oral Oral  SpO2:  99% 100% 100%  Weight: 100.6 kg 100.6 kg    Height:        Intake/Output Summary (Last 24 hours) at 04/28/2018 1232 Last data filed at 04/28/2018 1041 Gross per 24 hour  Intake 749 ml  Output 3085 ml  Net -2336 ml   Filed Weights   04/27/18 0427 04/28/18 0245 04/28/18 0515  Weight: 101.5 kg 100.6 kg 100.6 kg    Examination: General exam: Appears comfortable  HEENT: PERRLA, oral mucosa moist, no sclera icterus or thrush Respiratory system: CTA b/l  Cardiovascular system: S1 & S2 heard, RRR.   Gastrointestinal system: Abdomen soft, non-tender,still moderately distended. Normal bowel sounds. Extremities: left leg in Unna boot- right leg with mild edema. Skin: right foot wound not examined today  Data Reviewed: I have personally reviewed following labs and imaging studies  CBC: Recent Labs  Lab 04/25/18 0949 04/25/18 2008 04/26/18 0309 04/27/18 0243  WBC 16.9*  --  13.7* 13.8*  NEUTROABS  --   --  10.8*  --   HGB 8.3* 7.4* 7.7* 7.1*  HCT 26.5* 24.1* 25.5* 22.9*  MCV 91.4  --  92.4 90.2  PLT 268  --  228 540   Basic Metabolic Panel: Recent Labs  Lab 04/25/18 0949 04/26/18 0309  04/27/18 0243 04/28/18 0424 04/28/18 0857  NA 129* 127* 125* 125*  --   K 5.5* 4.6 4.1 2.8*  --   CL 96* 93* 89* 84*  --   CO2 21* 23 24 24   --   GLUCOSE 141* 213* 204* 201*  --   BUN 53* 48* 46* 43*  --   CREATININE 2.69* 2.43* 2.09* 1.88*  --   CALCIUM 8.4* 8.1* 7.8* 7.7*  --   MG  --   --   --   --  2.3   GFR: Estimated Creatinine Clearance: 38.5 mL/min (A) (by C-G formula based on SCr of 1.88 mg/dL (H)). Liver Function Tests: Recent Labs  Lab 04/25/18 0949  AST 170*  ALT 145*  ALKPHOS 115  BILITOT 1.5*  PROT 5.5*  ALBUMIN 3.4*   No results for input(s): LIPASE, AMYLASE in the last 168 hours. No results for input(s): AMMONIA in the last 168 hours. Coagulation Profile: No results for input(s): INR, PROTIME in the last 168 hours. Cardiac Enzymes: No results for input(s): CKTOTAL, CKMB, CKMBINDEX, TROPONINI in the last 168 hours. BNP (last 3 results) No results for input(s): PROBNP in the last 8760 hours. HbA1C: No results for input(s): HGBA1C in the last 72 hours. CBG: No results for input(s): GLUCAP in the last 168 hours. Lipid Profile: No results for input(s): CHOL, HDL, LDLCALC, TRIG, CHOLHDL, LDLDIRECT in the last 72 hours. Thyroid Function Tests: Recent Labs    04/27/18 0243  FREET4 1.11   Anemia Panel: Recent Labs    04/26/18 1300 04/27/18 0243  VITAMINB12  --  1,637*  FOLATE  --  11.0  FERRITIN 30  --   TIBC 298  --   IRON 11*  --   RETICCTPCT 4.3*  --    Urine analysis: No results found for: COLORURINE, APPEARANCEUR, LABSPEC, PHURINE, GLUCOSEU, HGBUR, BILIRUBINUR, KETONESUR, PROTEINUR, UROBILINOGEN, NITRITE, LEUKOCYTESUR Sepsis Labs: @LABRCNTIP (procalcitonin:4,lacticidven:4) ) Recent Results (from the past 240 hour(s))  Culture, body fluid-bottle     Status: None (Preliminary result)   Collection Time: 04/25/18  3:28 PM  Result Value Ref Range Status   Specimen Description FLUID PERITONEAL  Final   Special Requests BOTTLES DRAWN AEROBIC  AND ANAEROBIC  Final   Culture   Final    NO GROWTH 3 DAYS Performed at [facility], [address], [place], [place] [address]    Report Status PENDING  Incomplete  Gram stain     Status: None   Collection Time: 04/25/18  3:28 PM  Result Value Ref Range Status   Specimen Description FLUID PERITONEAL  Final   Special Requests NONE  Final   Gram Stain   Final    WBC PRESENT,BOTH PMN AND MONONUCLEAR NO ORGANISMS SEEN CYTOSPIN SMEAR Performed  at [facility], [place] [address], [place], [place] [address]    Report Status 04/25/2018 FINAL  Final         Radiology Studies: No results found.    Scheduled Meds: . allopurinol  100 mg Oral Daily  . amiodarone  200 mg Oral BID  . apixaban  5 mg Oral BID  . doxycycline  100 mg Oral Q12H  . iron polysaccharides  150 mg Oral Daily  . potassium chloride  40 mEq Oral Q4H  . sertraline  200 mg Oral Daily  . sodium chloride flush  10-40 mL Intracatheter Q12H  . sodium chloride flush  3 mL Intravenous Q12H  . terbinafine  250 mg Oral Daily   Continuous Infusions: . sodium chloride    . furosemide (LASIX) infusion 20 mg/hr (04/28/18 0600)  . milrinone 0.5 mcg/kg/min (04/28/18 1041)     LOS: 3 days      Debbe Odea, MD [facility] Pager: [email or web address] Password [facility] 04/28/2018, 12:32 PM

## 2018-04-28 NOTE — Progress Notes (Signed)
Pt,s red port is occluded IV team paged to declot will continue to monitor

## 2018-04-28 NOTE — Progress Notes (Signed)
Orthopaedic Trauma Progress Note  S: Patient doing okay this morning. Daughter at bedside. She notes that he has complained a lot less of pain yesterday and today then he did on Friday.  They both note some improvement in the swelling of his legs and feet. Overall he feels that the infection in the foot is clearing up and that the dressing changes and antibiotics are helping. Has no other complaints this morning.  O:  Vitals:   04/28/18 0515 04/28/18 0829  BP: (!) 99/52 (!) 104/50  Pulse: 61 70  Resp: 15 16  Temp: 97.6 F (36.4 C) 97.6 F (36.4 C)  SpO2: 99% 100%   General: Sitting up on edge of bed with feet flat on the ground. Alert and oriented x 3. Right Lower extremity: Dressing in place. Lower leg and foot with moderate swelling but much improved from previous exam. Some improvement in the purple discoloration of the toes. Wound on dorsum of foot with exudate and serous drainage. Less tender to palpation of lateral aspect of wound as compared to previous exam. Decreased sensation to dorsal and plantar aspect of foot, which is is baseline for him due to neuropathy.   Labs:  Results for orders placed or performed during the hospital encounter of 04/25/18 (from the past 24 hour(s))  Cooxemetry Panel (carboxy, met, total hgb, O2 sat)     Status: Abnormal   Collection Time: 04/28/18  4:15 AM  Result Value Ref Range   Total hemoglobin 7.2 (L) 12.0 - 16.0 g/dL   O2 Saturation 47.4 %   Carboxyhemoglobin 0.2 (L) 0.5 - 1.5 %   Methemoglobin 1.5 0.0 - 1.5 %  Basic metabolic panel     Status: Abnormal   Collection Time: 04/28/18  4:24 AM  Result Value Ref Range   Sodium 125 (L) 135 - 145 mmol/L   Potassium 2.8 (L) 3.5 - 5.1 mmol/L   Chloride 84 (L) 98 - 111 mmol/L   CO2 24 22 - 32 mmol/L   Glucose, Bld 201 (H) 70 - 99 mg/dL   BUN 43 (H) 8 - 23 mg/dL   Creatinine, Ser 1.88 (H) 0.61 - 1.24 mg/dL   Calcium 7.7 (L) 8.9 - 10.3 mg/dL   GFR calc non Af Amer 33 (L) >60 mL/min   GFR calc Af  Amer 39 (L) >60 mL/min   Anion gap 17 (H) 5 - 15    Assessment: Mr. Minehart is a 78 year old male with medical history significant ofCLL, hypertension, hyperlipidemia, paroxysmal A. 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   Plan: There is currently no role for surgical debridement. Will continue to monitor the right foot wound over next few days. Continue with wet to dry dressing changes twice daily . Continue oral antibiotic. Would hold off on putting Unna boot on right leg for now.   Haiven Nardone A. [facility] ?[phone]? (phone)

## 2018-04-29 DIAGNOSIS — I50814 Right heart failure due to left heart failure: Secondary | ICD-10-CM

## 2018-04-29 LAB — CBC
HCT: 22.8 % — ABNORMAL LOW (ref 39.0–52.0)
Hemoglobin: 7 g/dL — ABNORMAL LOW (ref 13.0–17.0)
MCH: 27.5 pg (ref 26.0–34.0)
MCHC: 30.7 g/dL (ref 30.0–36.0)
MCV: 89.4 fL (ref 80.0–100.0)
Platelets: 195 10*3/uL (ref 150–400)
RBC: 2.55 MIL/uL — ABNORMAL LOW (ref 4.22–5.81)
RDW: 15.9 % — ABNORMAL HIGH (ref 11.5–15.5)
WBC: 15.1 10*3/uL — ABNORMAL HIGH (ref 4.0–10.5)
nRBC: 0.3 % — ABNORMAL HIGH (ref 0.0–0.2)

## 2018-04-29 LAB — BASIC METABOLIC PANEL
Anion gap: 16 — ABNORMAL HIGH (ref 5–15)
Anion gap: 14 (ref 5–15)
Anion gap: 13 (ref 5–15)
BUN: 41 mg/dL — ABNORMAL HIGH (ref 8–23)
BUN: 43 mg/dL — AB (ref 8–23)
BUN: 44 mg/dL — ABNORMAL HIGH (ref 8–23)
CO2: 25 mmol/L (ref 22–32)
CO2: 27 mmol/L (ref 22–32)
CO2: 27 mmol/L (ref 22–32)
Calcium: 7.8 mg/dL — ABNORMAL LOW (ref 8.9–10.3)
Calcium: 8.4 mg/dL — ABNORMAL LOW (ref 8.9–10.3)
Calcium: 8.6 mg/dL — ABNORMAL LOW (ref 8.9–10.3)
Chloride: 78 mmol/L — ABNORMAL LOW (ref 98–111)
Chloride: 84 mmol/L — ABNORMAL LOW (ref 98–111)
Chloride: 88 mmol/L — ABNORMAL LOW (ref 98–111)
Creatinine, Ser: 1.67 mg/dL — ABNORMAL HIGH (ref 0.61–1.24)
Creatinine, Ser: 1.68 mg/dL — ABNORMAL HIGH (ref 0.61–1.24)
Creatinine, Ser: 1.78 mg/dL — ABNORMAL HIGH (ref 0.61–1.24)
GFR calc Af Amer: 44 mL/min — ABNORMAL LOW (ref >60)
GFR calc Af Amer: 44 mL/min — ABNORMAL LOW (ref >60)
GFR calc Af Amer: 41 mL/min — ABNORMAL LOW (ref >60)
GFR calc non Af Amer: 38 mL/min — ABNORMAL LOW (ref >60)
GFR calc non Af Amer: 38 mL/min — ABNORMAL LOW (ref >60)
GFR, EST NON AFRICAN AMERICAN: 35 mL/min — AB (ref >60)
GLUCOSE: 389 mg/dL — AB (ref 70–99)
Glucose, Bld: 193 mg/dL — ABNORMAL HIGH (ref 70–99)
Glucose, Bld: 182 mg/dL — ABNORMAL HIGH (ref 70–99)
POTASSIUM: 2.3 mmol/L — AB (ref 3.5–5.1)
Potassium: 2.9 mmol/L — ABNORMAL LOW (ref 3.5–5.1)
Potassium: 3.9 mmol/L (ref 3.5–5.1)
SODIUM: 125 mmol/L — AB (ref 135–145)
SODIUM: 128 mmol/L — AB (ref 135–145)
Sodium: 119 mmol/L — CL (ref 135–145)

## 2018-04-29 LAB — COOXEMETRY PANEL
Carboxyhemoglobin: 0.6 % (ref 0.5–1.5)
METHEMOGLOBIN: 0.8 % (ref 0.0–1.5)
O2 Saturation: 56.9 %
Total hemoglobin: 7.1 g/dL — ABNORMAL LOW (ref 12.0–16.0)

## 2018-04-29 MED ORDER — SENNA 8.6 MG PO TABS: 1.0000 | ORAL_TABLET | Freq: Every day | ORAL | Status: DC | PRN

## 2018-04-29 MED ORDER — TOLVAPTAN 15 MG PO TABS
30.0000 mg | ORAL_TABLET | Freq: Once | ORAL | Status: AC
  Administered 2018-04-29: 30 mg via ORAL
  Filled 2018-04-29: qty 2

## 2018-04-29 MED ORDER — BISACODYL 10 MG RE SUPP
10.0000 mg | Freq: Every day | RECTAL | Status: DC | PRN
  Administered 2018-05-11: 10 mg via RECTAL
  Filled 2018-04-29 (×2): qty 1

## 2018-04-29 MED ORDER — POTASSIUM CHLORIDE CRYS ER 20 MEQ PO TBCR
40.0000 meq | EXTENDED_RELEASE_TABLET | ORAL | Status: AC
  Administered 2018-04-29 (×2): 40 meq via ORAL
  Filled 2018-04-29 (×2): qty 2

## 2018-04-29 MED ORDER — ADULT MULTIVITAMIN W/MINERALS CH
1.0000 | ORAL_TABLET | Freq: Every day | ORAL | Status: DC
  Administered 2018-04-29 – 2018-05-15 (×17): 1 via ORAL
  Filled 2018-04-29 (×17): qty 1

## 2018-04-29 MED ORDER — LIDOCAINE VISCOUS HCL 2 % MT SOLN
15.0000 mL | Freq: Four times a day (QID) | OROMUCOSAL | Status: DC | PRN
  Administered 2018-04-29 – 2018-05-04 (×2): 15 mL via OROMUCOSAL
  Filled 2018-04-29 (×3): qty 15

## 2018-04-29 MED ORDER — POLYETHYLENE GLYCOL 3350 17 G PO PACK
17.0000 g | PACK | Freq: Every day | ORAL | Status: DC
  Administered 2018-04-29: 17 g via ORAL
  Filled 2018-04-29 (×2): qty 1

## 2018-04-29 MED ORDER — POTASSIUM CHLORIDE CRYS ER 20 MEQ PO TBCR: 40.0000 meq | EXTENDED_RELEASE_TABLET | ORAL | Status: DC

## 2018-04-29 MED ORDER — FUROSEMIDE 10 MG/ML IJ SOLN
80.0000 mg | Freq: Once | INTRAMUSCULAR | Status: AC
  Administered 2018-04-29: 80 mg via INTRAVENOUS
  Filled 2018-04-29: qty 8

## 2018-04-29 MED ORDER — DICLOFENAC SODIUM 1 % TD GEL
2.0000 g | Freq: Four times a day (QID) | TRANSDERMAL | Status: DC
  Administered 2018-04-29 – 2018-05-15 (×33): 2 g via TOPICAL
  Filled 2018-04-29: qty 100

## 2018-04-29 MED ORDER — POTASSIUM CHLORIDE CRYS ER 20 MEQ PO TBCR
60.0000 meq | EXTENDED_RELEASE_TABLET | Freq: Once | ORAL | Status: AC
  Administered 2018-04-29: 60 meq via ORAL
  Filled 2018-04-29: qty 3

## 2018-04-29 MED ORDER — POTASSIUM CHLORIDE 10 MEQ/50ML IV SOLN
10.0000 meq | INTRAVENOUS | Status: DC
  Administered 2018-04-29 (×2): 10 meq via INTRAVENOUS
  Filled 2018-04-29 (×6): qty 50

## 2018-04-29 MED ORDER — DOCUSATE SODIUM 100 MG PO CAPS: 100.0000 mg | ORAL_CAPSULE | Freq: Every day | ORAL | Status: DC | PRN

## 2018-04-29 NOTE — Progress Notes (Signed)
PROGRESS NOTE    Antonio Carrillo   OXB:[id number]  DOB: 11-04-1940  DOA: 04/25/2018 PCP: Patient, No Pcp Per   Brief Narrative:  Antonio Carrillo is a 78 y.o. male with medical history significant of CLL, hypertension, hyperlipidemia, paroxysmal A. 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   Subjective:  Abdominal swelling is improving as is his leg swelling. Has left knee pain today which is chronic and waxes and wanes. Also has a dry and sort throat and mouth and asking for more magic mouthwash that he was using at home. Appetite very poor. He is afraid to eat at it will make him thirsty and he is on a fluid restriction  Assessment & Plan:   Principal Problem:   Acute on chronic combined systolic and diastolic CHF with hyponatremia, ascites, pedal edema - appreciate management by cardiology - palliative care discussions per cardiology  Active Problems: PAF - on Amiodarone 200 BID and Eliquis-   AKI (acute kidney injury)  - baseline < 1- on admission ~ 2.5 - likely cardiorenal- improving  Hyperkalemia - improved  Left foot wound- leukocytosis - received sutures by Ortho on 1/15 at [facility] along with Keflex - was due to see them on this past Wed but did not make it - weeping profusely likely due to pedal edema- see pictures below - I spoke with ortho, Dr Doreatha Martin, in regards to the foot he evaluated the patient on 1/24- he recommended and antibiotic and we decided on Doxycyline- he has removed the sutures and has ordered wet to dry dressing -  erythema and swelling improving  Anxiety/ depression - Zoloft daily, Ativan PRN  Anemia, chronic - baseline 8-9- -- 7.0 today - anemia panel shows low normal Ferritin and low Iron saturation with normal IBC - cardiology has given Feraheme  - on Niferex daily as oupt - continue-  Cardiology would like to hold off on transfusion as he is fluid overloaded    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 - follows a [facility] - note from 12/30 /19 recommends observation only after his treatment and repeat CT in 3 months  Gout - Allopurinol  Elevated TSH - likely sick euthyroid-  Free T 4 is normal  Constipated - Miralax daily and Dulcolax PRN   Time spent in minutes: 35 DVT prophylaxis: Eliquis Code Status: Full code Family Communication:  Disposition Plan: to be determined Consultants:   Heart failure team   ortho Procedures:     Antimicrobials:  Anti-infectives (From admission, onward)   Start     Dose/Rate Route Frequency Ordered Stop   04/26/18 1330  doxycycline (VIBRA-TABS) tablet 100 mg     100 mg Oral Every 12 hours 04/26/18 1242     04/25/18 1300  terbinafine (LAMISIL) tablet 250 mg     250 mg Oral Daily 04/25/18 1143         Objective: Vitals:   04/29/18 0755 04/29/18 0758 04/29/18 0800 04/29/18 1609  BP:  126/65    Pulse:  65 65   Resp:  (!) 24 (!) 21   Temp:  97.6 F (36.4 C)    TempSrc:  Oral    SpO2: (!) 83%  94%   Weight:    97.8 kg  Height:        Intake/Output Summary (Last  24 hours) at 04/29/2018 1612 Last data filed at 04/29/2018 1112 Gross per 24 hour  Intake 1354 ml  Output 3290 ml  Net -1936 ml   Filed Weights   04/28/18 0515 04/29/18 0151 04/29/18 1609  Weight: 100.6 kg 99.2 kg 97.8 kg    Examination: General exam: Appears comfortable  HEENT: PERRLA, oral mucosa very dry, no sclera icterus or thrush Respiratory system: Clear to auscultation. Respiratory effort normal. Cardiovascular system: S1 & S2 heard,  No murmurs  Gastrointestinal system: Abdomen soft, non-tender,moderately distended. Normal bowel sound. No organomegaly Central nervous system: Alert and oriented. No focal neurological deficits. Extremities: No cyanosis, clubbing - legs wrapped - edema in knees and  thighs noted Psychiatry:  Mood & affect appropriate.   Data Reviewed: I have personally reviewed following labs and imaging studies  CBC: Recent Labs  Lab 04/25/18 0949 04/25/18 2008 04/26/18 0309 04/27/18 0243 04/29/18 1136  WBC 16.9*  --  13.7* 13.8* 15.1*  NEUTROABS  --   --  10.8*  --   --   HGB 8.3* 7.4* 7.7* 7.1* 7.0*  HCT 26.5* 24.1* 25.5* 22.9* 22.8*  MCV 91.4  --  92.4 90.2 89.4  PLT 268  --  228 200 546   Basic Metabolic Panel: Recent Labs  Lab 04/27/18 0243 04/28/18 0424 04/28/18 0857 04/28/18 1758 04/29/18 0438 04/29/18 1136  NA 125* 125*  --  123* 119* 125*  K 4.1 2.8*  --  2.9* 2.3* 2.9*  CL 89* 84*  --  82* 78* 84*  CO2 24 24  --  27 25 27   GLUCOSE 204* 201*  --  257* 389* 193*  BUN 46* 43*  --  41* 41* 43*  CREATININE 2.09* 1.88*  --  1.78* 1.67* 1.68*  CALCIUM 7.8* 7.7*  --  8.0* 7.8* 8.4*  MG  --   --  2.3  --   --   --    GFR: Estimated Creatinine Clearance: 42.5 mL/min (A) (by C-G formula based on SCr of 1.68 mg/dL (H)). Liver Function Tests: Recent Labs  Lab 04/25/18 0949  AST 170*  ALT 145*  ALKPHOS 115  BILITOT 1.5*  PROT 5.5*  ALBUMIN 3.4*   No results for input(s): LIPASE, AMYLASE in the last 168 hours. No results for input(s): AMMONIA in the last 168 hours. Coagulation Profile: No results for input(s): INR, PROTIME in the last 168 hours. Cardiac Enzymes: No results for input(s): CKTOTAL, CKMB, CKMBINDEX, TROPONINI in the last 168 hours. BNP (last 3 results) No results for input(s): PROBNP in the last 8760 hours. HbA1C: No results for input(s): HGBA1C in the last 72 hours. CBG: No results for input(s): GLUCAP in the last 168 hours. Lipid Profile: No results for input(s): CHOL, HDL, LDLCALC, TRIG, CHOLHDL, LDLDIRECT in the last 72 hours. Thyroid Function Tests: Recent Labs    04/27/18 0243  FREET4 1.11   Anemia Panel: Recent Labs    04/27/18 0243  VITAMINB12 1,637*  FOLATE 11.0   Urine analysis: No results found  for: COLORURINE, APPEARANCEUR, LABSPEC, PHURINE, GLUCOSEU, HGBUR, BILIRUBINUR, KETONESUR, PROTEINUR, UROBILINOGEN, NITRITE, LEUKOCYTESUR Sepsis Labs: @LABRCNTIP (procalcitonin:4,lacticidven:4) ) Recent Results (from the past 240 hour(s))  Culture, body fluid-bottle     Status: None (Preliminary result)   Collection Time: 04/25/18  3:28 PM  Result Value Ref Range Status   Specimen Description FLUID PERITONEAL  Final   Special Requests BOTTLES DRAWN AEROBIC AND ANAEROBIC  Final   Culture   Final    NO GROWTH 4  DAYS Performed at [facility], [place] [address], [place], [place] [address]    Report Status PENDING  Incomplete  Gram stain     Status: None   Collection Time: 04/25/18  3:28 PM  Result Value Ref Range Status   Specimen Description FLUID PERITONEAL  Final   Special Requests NONE  Final   Gram Stain   Final    WBC PRESENT,BOTH PMN AND MONONUCLEAR NO ORGANISMS SEEN CYTOSPIN SMEAR Performed at [facility], [address], [place], [place] [address]    Report Status 04/25/2018 FINAL  Final         Radiology Studies: No results found.    Scheduled Meds: . allopurinol  100 mg Oral Daily  . amiodarone  200 mg Oral BID  . apixaban  5 mg Oral BID  . diclofenac sodium  2 g Topical QID  . doxycycline  100 mg Oral Q12H  . feeding supplement (ENSURE ENLIVE)  237 mL Oral BID BM  . iron polysaccharides  150 mg Oral Daily  . multivitamin with minerals  1 tablet Oral Daily  . polyethylene glycol  17 g Oral Daily  . potassium chloride  60 mEq Oral Once  . sertraline  200 mg Oral Daily  . sodium chloride flush  10-40 mL Intracatheter Q12H  . sodium chloride flush  3 mL Intravenous Q12H  . spironolactone  25 mg Oral BID  . terbinafine  250 mg Oral Daily   Continuous Infusions: . sodium chloride    . milrinone 0.5 mcg/kg/min (04/29/18 1312)     LOS: 4 days      Debbe Odea, MD [facility] Pager: [email or web address] Password [facility] 04/29/2018, 4:12 PM

## 2018-04-29 NOTE — Progress Notes (Addendum)
Patient ID: Antonio Carrillo, male   DOB: 12-28-1940, 78 y.o.   MRN: [id number]     Advanced Heart Failure Rounding Note  PCP-Cardiologist: No primary care provider on file.   Subjective:    Admitted from clinic 04/25/2018 with marked volume overloaded and abdominal distention. PICC placed.   Yesterday diuresed with lasix drip + metolazone and give tolvaptan. Remains on milrinone 0.5 mcg. CO-OX 57%.   Creatinine trending down 1.67. Sodium down to 119.   Feels terrible. Hungry. Very thirsty. Complaining of constipation.   Echo performed 04/25/2018: LV EF 55-60%, D-shaped interventricular septum with RV enlargement and dysfunction, severe TR.   Objective:   Weight Range: 99.2 kg Body mass index is 30.49 kg/m.   Vital Signs:   Temp:  [96.5 F (35.8 C)-98 F (36.7 C)] 98 F (36.7 C) (01/27 0405) Pulse Rate:  [58-75] 58 (01/27 0405) Resp:  [16-22] 22 (01/27 0405) BP: (103-125)/(50-61) 106/55 (01/27 0405) SpO2:  [93 %-100 %] 93 % (01/27 0405) Weight:  [99.2 kg] 99.2 kg (01/27 0151) Last BM Date: 04/25/18  Weight change: Filed Weights   04/28/18 0245 04/28/18 0515 04/29/18 0151  Weight: 100.6 kg 100.6 kg 99.2 kg   Intake/Output:   Intake/Output Summary (Last 24 hours) at 04/29/2018 0757 Last data filed at 04/29/2018 0600 Gross per 24 hour  Intake 1598 ml  Output 3525 ml  Net -1927 ml    Physical Exam   CVP 20 personally checked.  General:  Appears chronically ill. Sitting on the side of the bed.  No resp difficulty HEENT: normal Neck: supple. JVP to jaw . Carotids 2+ bilat; no bruits. No lymphadenopathy or thryomegaly appreciated. Cor: PMI nondisplaced. Regular rate & rhythm. No rubs, gallops or murmurs. Lungs: clear Abdomen: soft, nontender, nondistended. No hepatosplenomegaly. No bruits or masses. Good bowel sounds. Extremities: no cyanosis, clubbing, rash, R and LLE 3+ edema noted in thighs. . R and LLE unna boots . RUE PICC  Neuro: alert & orientedx3, cranial nerves  grossly intact. moves all 4 extremities w/o difficulty. Affect pleasant   Telemetry  NSR 60-80s  Labs    CBC Recent Labs    04/27/18 0243  WBC 13.8*  HGB 7.1*  HCT 22.9*  MCV 90.2  PLT 086   Basic Metabolic Panel Recent Labs    04/28/18 0857 04/28/18 1758 04/29/18 0438  NA  --  123* 119*  K  --  2.9* 2.3*  CL  --  82* 78*  CO2  --  27 25  GLUCOSE  --  257* 389*  BUN  --  41* 41*  CREATININE  --  1.78* 1.67*  CALCIUM  --  8.0* 7.8*  MG 2.3  --   --    Liver Function Tests No results for input(s): AST, ALT, ALKPHOS, BILITOT, PROT, ALBUMIN in the last 72 hours. No results for input(s): LIPASE, AMYLASE in the last 72 hours. Cardiac Enzymes No results for input(s): CKTOTAL, CKMB, CKMBINDEX, TROPONINI in the last 72 hours.  BNP: BNP (last 3 results) Recent Labs    04/25/18 0949  BNP 1,544.3*    ProBNP (last 3 results) No results for input(s): PROBNP in the last 8760 hours.   D-Dimer No results for input(s): DDIMER in the last 72 hours. Hemoglobin A1C No results for input(s): HGBA1C in the last 72 hours. Fasting Lipid Panel No results for input(s): CHOL, HDL, LDLCALC, TRIG, CHOLHDL, LDLDIRECT in the last 72 hours. Thyroid Function Tests No results for input(s): TSH, T4TOTAL, T3FREE,  THYROIDAB in the last 72 hours.  Invalid input(s): FREET3  Other results:   Imaging    No results found.   Medications:     Scheduled Medications: . allopurinol  100 mg Oral Daily  . amiodarone  200 mg Oral BID  . apixaban  5 mg Oral BID  . doxycycline  100 mg Oral Q12H  . feeding supplement (ENSURE ENLIVE)  237 mL Oral BID BM  . iron polysaccharides  150 mg Oral Daily  . potassium chloride  40 mEq Oral Q4H  . potassium chloride  60 mEq Oral Once  . sertraline  200 mg Oral Daily  . sodium chloride flush  10-40 mL Intracatheter Q12H  . sodium chloride flush  3 mL Intravenous Q12H  . spironolactone  25 mg Oral BID  . terbinafine  250 mg Oral Daily  . tolvaptan   30 mg Oral Once  . white petrolatum        Infusions: . sodium chloride    . milrinone 0.5 mcg/kg/min (04/29/18 0400)    PRN Medications: sodium chloride, acetaminophen **OR** acetaminophen, HYDROcodone-acetaminophen, lidocaine, LORazepam, ondansetron **OR** ondansetron (ZOFRAN) IV, prochlorperazine, sodium chloride flush, sodium chloride flush  Patient Profile   Antonio Carrillo is a 78 y.o. male with h/o AF s/p several ablations, aortic root aneurysm, s/p aortic root replacement with CABG 1, RV failure, CLL, and ascites.   Sent for admission from HF clinic 04/25/2018 with marked volume overload on exam, RLE wound, and marked ascites.   Assessment/Plan   1. 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 cMRI 4/18 at [facility] with normal LV function. RV severely dilated and mild RV dysfunction.  Echo this admission with LV EF 55-60%, RV dilated and dysfunctional, severe TR, D-shaped septum.  - CO-OX improved today -->57%. Continue milrinone 0.5 mcg.  - CVP 20. Per Dr Haroldine Laws stop lasix and metolazone until K aggressively replaced.  - Weight down another 3 pounds.   - Continue spiro 25 bid.  - May need repeat paracentesis this admission.  - Long-term prognosis is poor.  With RV failure, not candidate for LVAD and also would be poor HD candidate. Palliative Care consult pending  - Per Dr Haroldine Laws. For now he is hopeful that he may get some recovery. He was very satisfied with his QOL recently and would like to get back to that. We will continue inotrope support and diuresis.  2. PAF: S/p multiple ablations and MAZE. Suspect NSR, rate in 60s.   - EKG now.  - Continue amiodarone 200 mg bid since he is on milrinone.   - Now off digoxin. Will not restart for now with AK - Continue Eliquis.  3. CLL: Per Oncology.  Now s/p pelvic XRT  4. CAD and CABG x 1: No chest pain. No ASA given apixaban use.     5. Aortic root aneurysm  s/p repair  6. RLE laceration: Appreciate WOC and Ortho input. No fevers or chills.  - Management per Ortho 7. Iron deficiency Anemia: No overt bleeding, remains on apixaban.   Check CBC.  -   With marked volume overload, would not transfuse until Hgb < 7.  Transferrin saturation low. - Received feraheme 11/25.  8. Ascites: Persists.  S/p 2 paracenteses recently with 4.1 and 1.3 L out. Will likely need repeat next week with continued diuresis. 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 Cytology from paracentesis has repeatedly been negative  for malignancy 9. Hyponatremia:  - Sodium down to 119. Give dose of tolvaptan later today. Continue daily.  - Free water restrict.  10. Hypokalemia Supp K. Continue spironolactone 25 mg twice a day.   K and Sodium extremely low. Restrict free water. Will need to hold lasix drip and metolazone until we check BMEt at 1100.  Discussed with him and his daughter limiting fluid intake. Will ask dietitian to consult.    Amy Clegg NP-C  04/29/2018 7:57 AM  Patient seen and examined with the above-signed Advanced Practice Provider and/or Housestaff. I personally reviewed laboratory data, imaging studies and relevant notes. I independently examined the patient and formulated the important aspects of the plan. I have edited the note to reflect any of my changes or salient points. I have personally discussed the plan with the patient and/or family.  He remains very tenuous. On milrinone at 0.375 and lasix gtt at 20. He is down 3 pounds but CVP still 20. Sodium 119 and K 2.3 this am. Electrolytes being supped and tolvaptan ordered. Co-ox improved to 58%. Denies SOB, orthopnea. Feels weak. Asking about his prognosis  Weak appearing JVP to ear Cor RRR 2/6 TR + RV lift Lungs clear Ab distended soft Ext 3+ edema to thighs. Wrapped   He remains extremely tenuous with advanced RV failure despite mirlinone support. Fortunately co-ox and renal function  improving with diuresis. He now has severe dilutional hyponatremia and severe hypokalemia. Have counseled him to avoid free water. Will Hold lasix and supplement K aggressively. Arlyce Harman started. Will repeat tolvaptan dosing. Restart Lasix gtt when K > 3.0. We again discussed prognosis at length and hopefully cardiac output and RV function will continue to improve with volume removal.   CRITICAL CARE Performed by: Glori Bickers  Total critical care time: 35 minutes  Critical care time was exclusive of separately billable procedures and treating other patients.  Critical care was necessary to treat or prevent imminent or life-threatening deterioration.  Critical care was time spent personally by me (independent of midlevel providers or residents) on the following activities: development of treatment plan with patient and/or surrogate as well as nursing, discussions with consultants, evaluation of patient's response to treatment, examination of patient, obtaining history from patient or surrogate, ordering and performing treatments and interventions, ordering and review of laboratory studies, ordering and review of radiographic studies, pulse oximetry and re-evaluation of patient's condition.  Glori Bickers, MD  11:12 AM

## 2018-04-29 NOTE — Progress Notes (Signed)
CRITICAL VALUE ALERT  Critical Value: Potassium 2.3, sodium 119  Date & Time Notied: 27 Apr 22 518  Provider Notified: Jeannette Corpus NP for TRH and Dr. Cristobal GoldmannHassell Done for cardiology  Orders Received/Actions taken: 6 runs of Potassium ordered and no new orders at this time related to the sodium

## 2018-04-29 NOTE — Progress Notes (Signed)
Initial Nutrition Assessment  DOCUMENTATION CODES:   (Will assess for malnutrition at follow-up.)  INTERVENTION:  - Will order daily multivitamin with minerals. - Continue Ensure Enlive BID.  - Continue to encourage PO intakes.  - Will communicate with RD who covers the floor.   NUTRITION DIAGNOSIS:   Inadequate oral intake related to decreased appetite(early satiety) as evidenced by per patient/family report, meal completion < 50%.  GOAL:   Patient will meet greater than or equal to 90% of their needs  MONITOR:   PO intake, Supplement acceptance, Weight trends, Labs, I & O's  REASON FOR ASSESSMENT:   Consult Assessment of nutrition requirement/status, Diet education  ASSESSMENT:   78 y.o. male with medical history significant of CLL, HTN, hyperlipidemia, A. 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 Patient presented to the ED from Cardiology office with profound leg swelling and not responding to oral diuretics at home.  BMI indicates obesity; will continue to monitor weight trends. Weight -4 lb compared to admission weight. Per review of flowsheet, weight has fluctuated significantly over the past 1 year (177-223 lb). Patient and family, who are at bedside, unsure of a UBW.   Per chart review, patient has been eating 20-25% of meals. Family reports that he experiences early satiety. Patient reports feeling thirsty with nearly all PO intakes and family reports they were informed that fluids are to be restricted to "1/3 of 64 oz, so may be only 18 oz/day." Family interested in ways to increase intake while limiting sodium, fluid, and thirst sensation. Provided tips which family plans to try over the next 1-2 days. Will communicate with RD who covers the floor about what was discussed in order to monitor if further recommendations need to be made.   Lunch tray was delivered shortly before RD visit: 2 cups of yogurt. Patient states he does not want  yogurt and likely will not eat. Daughter plans to cook meals this afternoon to bring in for him.   Per Dr. Clayborne Dana note this AM: long-term prognosis is poor and patient is not a candidate for LVAD and is a poor HD candidate; Palliative Care has been consulted.    Medications reviewed; 40 mEq K-Dur x2 doses 1/27, 60 mEq K-Dur x1 dose 1/27, 25 mg aldactone BID,  Labs reviewed; Na: 119 mmol/l, K: 2.3 mmol/l, Cl: 78 mmol/l, BUN: 41 mg/dl, creatinine: 1.67 mg/dl, Ca: 7.8 mg/dl, GFR: 38 ml/min.       NUTRITION - FOCUSED PHYSICAL EXAM:  Patient refused; will attempt at follow-up.  Diet Order:   Diet Order            Diet Heart Room service appropriate? Yes; Fluid consistency: Thin  Diet effective now              EDUCATION NEEDS:   Education needs have been addressed  Skin:  Skin Assessment: Skin Integrity Issues: Skin Integrity Issues:: Incisions Incisions: R foot (1/23)  Last BM:  PTA/unknown  Height:   Ht Readings from Last 1 Encounters:  04/25/18 5\' 11"  (1.803 m)    Weight:   Wt Readings from Last 1 Encounters:  04/29/18 99.2 kg    Ideal Body Weight:  78.18 kg  BMI:  Body mass index is 30.49 kg/m.  Estimated Nutritional Needs:   Kcal:  1900-2100 kcal  Protein:  75-85 grams  Fluid:  >/= 1.2L/day      Jarome Matin, MS, RD, LDN, CNSC Inpatient Clinical Dietitian Pager # [phone] After hours/weekend pager #  [phone]  

## 2018-04-29 NOTE — Care Management Important Message (Signed)
Important Message  Patient Details  Name: Antonio Carrillo MRN: [id number] Date of Birth: 07-08-1940   Medicare Important Message Given:  Yes    Barb Merino [place] 04/29/2018, 12:13 PM

## 2018-04-29 NOTE — Plan of Care (Signed)
  Problem: Education: Goal: Ability to demonstrate management of disease process will improve Outcome: Progressing Goal: Ability to verbalize understanding of medication therapies will improve Outcome: Progressing   Problem: Activity: Goal: Capacity to carry out activities will improve Outcome: Progressing   

## 2018-04-30 DIAGNOSIS — Z7189 Other specified counseling: Secondary | ICD-10-CM

## 2018-04-30 LAB — COOXEMETRY PANEL
CARBOXYHEMOGLOBIN: 2.7 % — AB (ref 0.5–1.5)
Carboxyhemoglobin: 0.4 % — ABNORMAL LOW (ref 0.5–1.5)
Methemoglobin: 1.1 % (ref 0.0–1.5)
Methemoglobin: 1 % (ref 0.0–1.5)
O2 Saturation: 98 %
O2 Saturation: 76.4 %
TOTAL HEMOGLOBIN: 7.5 g/dL — AB (ref 12.0–16.0)
Total hemoglobin: 7.5 g/dL — ABNORMAL LOW (ref 12.0–16.0)

## 2018-04-30 LAB — CBC WITH DIFFERENTIAL/PLATELET
Abs Immature Granulocytes: 0.15 10*3/uL — ABNORMAL HIGH (ref 0.00–0.07)
Basophils Absolute: 0 10*3/uL (ref 0.0–0.1)
Basophils Relative: 0 %
EOS ABS: 0 10*3/uL (ref 0.0–0.5)
Eosinophils Relative: 0 %
HCT: 23.7 % — ABNORMAL LOW (ref 39.0–52.0)
Hemoglobin: 7.2 g/dL — ABNORMAL LOW (ref 13.0–17.0)
Immature Granulocytes: 1 %
Lymphocytes Relative: 12 %
Lymphs Abs: 1.8 10*3/uL (ref 0.7–4.0)
MCH: 27.3 pg (ref 26.0–34.0)
MCHC: 30.4 g/dL (ref 30.0–36.0)
MCV: 89.8 fL (ref 80.0–100.0)
MONOS PCT: 10 %
Monocytes Absolute: 1.5 10*3/uL — ABNORMAL HIGH (ref 0.1–1.0)
Neutro Abs: 11.6 10*3/uL — ABNORMAL HIGH (ref 1.7–7.7)
Neutrophils Relative %: 77 %
Platelets: 196 10*3/uL (ref 150–400)
RBC: 2.64 MIL/uL — ABNORMAL LOW (ref 4.22–5.81)
RDW: 16.2 % — ABNORMAL HIGH (ref 11.5–15.5)
WBC: 15.2 10*3/uL — ABNORMAL HIGH (ref 4.0–10.5)
nRBC: 0.2 % (ref 0.0–0.2)

## 2018-04-30 LAB — BASIC METABOLIC PANEL
ANION GAP: 13 (ref 5–15)
Anion gap: 11 (ref 5–15)
BUN: 36 mg/dL — ABNORMAL HIGH (ref 8–23)
BUN: 42 mg/dL — ABNORMAL HIGH (ref 8–23)
CALCIUM: 7 mg/dL — AB (ref 8.9–10.3)
CO2: 24 mmol/L (ref 22–32)
CO2: 27 mmol/L (ref 22–32)
Calcium: 8.7 mg/dL — ABNORMAL LOW (ref 8.9–10.3)
Chloride: 96 mmol/L — ABNORMAL LOW (ref 98–111)
Chloride: 88 mmol/L — ABNORMAL LOW (ref 98–111)
Creatinine, Ser: 1.29 mg/dL — ABNORMAL HIGH (ref 0.61–1.24)
Creatinine, Ser: 1.52 mg/dL — ABNORMAL HIGH (ref 0.61–1.24)
GFR calc Af Amer: >60 mL/min (ref >60)
GFR calc non Af Amer: 52 mL/min — ABNORMAL LOW (ref >60)
GFR calc non Af Amer: 43 mL/min — ABNORMAL LOW (ref >60)
GFR, EST AFRICAN AMERICAN: 50 mL/min — AB (ref >60)
Glucose, Bld: 136 mg/dL — ABNORMAL HIGH (ref 70–99)
Glucose, Bld: 198 mg/dL — ABNORMAL HIGH (ref 70–99)
Potassium: 2.9 mmol/L — ABNORMAL LOW (ref 3.5–5.1)
Potassium: 4.1 mmol/L (ref 3.5–5.1)
Sodium: 131 mmol/L — ABNORMAL LOW (ref 135–145)
Sodium: 128 mmol/L — ABNORMAL LOW (ref 135–145)

## 2018-04-30 LAB — CULTURE, BODY FLUID W GRAM STAIN -BOTTLE: Culture: NO GROWTH

## 2018-04-30 MED ORDER — DOCUSATE SODIUM 100 MG PO CAPS
100.0000 mg | ORAL_CAPSULE | Freq: Every day | ORAL | Status: DC
  Administered 2018-04-30 – 2018-05-07 (×8): 100 mg via ORAL
  Filled 2018-04-30 (×8): qty 1

## 2018-04-30 MED ORDER — POTASSIUM CHLORIDE CRYS ER 20 MEQ PO TBCR
60.0000 meq | EXTENDED_RELEASE_TABLET | ORAL | Status: AC
  Administered 2018-04-30 (×3): 60 meq via ORAL
  Filled 2018-04-30 (×3): qty 3

## 2018-04-30 MED ORDER — SPIRONOLACTONE 25 MG PO TABS
50.0000 mg | ORAL_TABLET | Freq: Two times a day (BID) | ORAL | Status: DC
  Administered 2018-04-30 – 2018-05-01 (×2): 50 mg via ORAL
  Filled 2018-04-30 (×2): qty 2

## 2018-04-30 MED ORDER — SENNA 8.6 MG PO TABS
1.0000 | ORAL_TABLET | Freq: Every day | ORAL | Status: DC
  Administered 2018-04-30: 8.6 mg via ORAL
  Filled 2018-04-30: qty 1

## 2018-04-30 MED ORDER — MILRINONE LACTATE IN DEXTROSE 20-5 MG/100ML-% IV SOLN
0.1250 ug/kg/min | INTRAVENOUS | Status: DC
  Administered 2018-04-30 – 2018-05-08 (×26): 0.5 ug/kg/min via INTRAVENOUS
  Administered 2018-05-09 – 2018-05-10 (×3): 0.375 ug/kg/min via INTRAVENOUS
  Administered 2018-05-10: 0.25 ug/kg/min via INTRAVENOUS
  Administered 2018-05-12: 0.125 ug/kg/min via INTRAVENOUS
  Filled 2018-04-30 (×37): qty 100

## 2018-04-30 MED ORDER — SENNA 8.6 MG PO TABS: 1.0000 | ORAL_TABLET | Freq: Two times a day (BID) | ORAL | Status: DC

## 2018-04-30 MED ORDER — DOXYCYCLINE HYCLATE 100 MG PO TABS
100.0000 mg | ORAL_TABLET | Freq: Two times a day (BID) | ORAL | Status: AC
  Administered 2018-04-30 – 2018-05-01 (×2): 100 mg via ORAL
  Filled 2018-04-30 (×2): qty 1

## 2018-04-30 MED ORDER — FUROSEMIDE 10 MG/ML IJ SOLN
120.0000 mg | Freq: Two times a day (BID) | INTRAVENOUS | Status: DC
  Administered 2018-04-30 – 2018-05-01 (×3): 120 mg via INTRAVENOUS
  Filled 2018-04-30: qty 2
  Filled 2018-04-30: qty 10
  Filled 2018-04-30: qty 2

## 2018-04-30 NOTE — Progress Notes (Signed)
Pt/family requested weight after having two BM's and frequent urination from IV lasix. 96.3 kg. Family aware that pr will be weighed daily in morning. Daughter feels dad will feel better if sees improvement in weight. Pt resting with call bell within reach.  Will continue to monitor.

## 2018-04-30 NOTE — Progress Notes (Signed)
PROGRESS NOTE    Antonio Carrillo   AYT:[id number]  DOB: Feb 09, 1941  DOA: 04/25/2018 PCP: Patient, No Pcp Per   Brief Narrative:  Antonio Carrillo is a 78 y.o. male with medical history significant of CLL, hypertension, hyperlipidemia, paroxysmal A. Fib and Eliquis, recent left foot injury, severe right ventricular failure with recurrent ascites,  presents to the hospital from cardiology office with profound leg swelling not responding to oral diuretics at home.   Subjective: He feels well today. No specific complaints for me.   Assessment & Plan:   Principal Problem:   Acute on chronic combined systolic and diastolic CHF with hyponatremia, ascites, pedal edema - appreciate management by cardiology - palliative care discussions underway per cardiology  Active Problems: PAF - on Amiodarone 200 BID and Eliquis-   AKI (acute kidney injury)  - baseline < 1- on admission ~ 2.5 - likely cardiorenal- improving with management of heart failure  Hyperkalemia - continue to replace   Left foot wound- leukocytosis - received sutures by Ortho on 1/15 at [facility] along with Keflex - was due to see them on this past Wed but did not make it -1/24   was swollen and weeping profusely likely due to pedal edema - his wound had dehisced and cellulitis was noted.   - after examining wound,  I consulted ortho, Dr Doreatha Martin - he evaluated the patient and removed the sutures- he recommended an antibiotic and we decided on Doxycyline- - he has ordered wet to dry dressing- the foot has subsequently been wrapped with an ACE bandage - I have been examining the wound regularly-  erythema has resolved and swelling is improving with diuresis- would recommend  Doxycycline be stopped after tomorrow AM's dose (10 doses/ 5 days total) and wound continue to be observed closely-  I will place a stop time  Anxiety/ depression - Zoloft daily, Ativan PRN  Anemia, chronic - baseline 8-9- -Hb as been ~ 7-8 range   -  anemia panel shows low normal Ferritin and low Iron saturation with normal IBC - cardiology has given Feraheme  - on Niferex daily as oupt - continue- Cardiology would like to hold off on transfusion until Hb is < 7 as he is fluid overloaded    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 - follows a [facility] - note from 12/30 /19 recommends observation only after his treatment and repeat CT in 3 months  Dry, painful mouth and throat - viscous Lidocaine PRN (was using magic mouthwash for the Lidocaine component at home)  Gout - Allopurinol  L knee pain/ arthritis - Voltaren gel  Elevated TSH - likely sick euthyroid-  Free T 4 is normal  Constipated - Miralax daily and Dulcolax PRN   Time spent in minutes: 35 DVT prophylaxis: Eliquis Code Status: Full code Family Communication:  Disposition Plan: to be determined Consultants:   Heart failure team   ortho Procedures:    suture removal 1/24  PICC 1/23 Antimicrobials:  Anti-infectives (From admission, onward)   Start     Dose/Rate Route Frequency Ordered Stop   04/30/18 2200  doxycycline (VIBRA-TABS) tablet 100 mg     100 mg Oral Every 12 hours 04/30/18 1401 05/01/18 2159   04/26/18 1330  doxycycline (VIBRA-TABS) tablet 100 mg  Status:  Discontinued     100 mg Oral Every 12 hours 04/26/18 1242 04/30/18 1401   04/25/18 1300  terbinafine (LAMISIL) tablet 250 mg  250 mg Oral Daily 04/25/18 1143         Objective: Vitals:   04/30/18 0700 04/30/18 0811 04/30/18 1230 04/30/18 1233  BP:  (!) 105/56 117/68   Pulse:  60 62   Resp: (!) 22 20 20    Temp:  98.6 F (37 C) 97.9 F (36.6 C)   TempSrc:  Oral Oral   SpO2:  94%  98%  Weight:      Height:        Intake/Output Summary (Last 24 hours) at 04/30/2018 1403 Last data filed at 04/29/2018 2331 Gross per 24 hour  Intake 200 ml  Output 1175 ml  Net -975 ml   Filed Weights   04/29/18 0151 04/29/18 1609 04/30/18 0345    Weight: 99.2 kg 97.8 kg 97.2 kg    Examination: General exam: Appears comfortable  HEENT: PERRLA, oral mucosa moist, no sclera icterus or thrush Respiratory system: Clear to auscultation. Respiratory effort normal. Cardiovascular system: S1 & S2 heard,  No murmurs  Gastrointestinal system: Abdomen soft, non-tender, moderately distended. Normal bowel sound.   Psychiatry:  Mood & affect appropriate.  Central nervous system: Alert and oriented. No focal neurological deficits. Extremities: No cyanosis, clubbing - still has significant bilateral edema- right leg has Unna boot Skin:         Data Reviewed: I have personally reviewed following labs and imaging studies  CBC: Recent Labs  Lab 04/25/18 0949 04/25/18 2008 04/26/18 0309 04/27/18 0243 04/29/18 1136 04/30/18 0437  WBC 16.9*  --  13.7* 13.8* 15.1* 15.2*  NEUTROABS  --   --  10.8*  --   --  11.6*  HGB 8.3* 7.4* 7.7* 7.1* 7.0* 7.2*  HCT 26.5* 24.1* 25.5* 22.9* 22.8* 23.7*  MCV 91.4  --  92.4 90.2 89.4 89.8  PLT 268  --  228 200 195 735   Basic Metabolic Panel: Recent Labs  Lab 04/28/18 0857  04/29/18 0438 04/29/18 1136 04/29/18 1931 04/30/18 0437 04/30/18 1300  NA  --    < > 119* 125* 128* 131* 128*  K  --    < > 2.3* 2.9* 3.9 2.9* 4.1  CL  --    < > 78* 84* 88* 96* 88*  CO2  --    < > 25 27 27 24 27   GLUCOSE  --    < > 389* 193* 182* 136* 198*  BUN  --    < > 41* 43* 44* 36* 42*  CREATININE  --    < > 1.67* 1.68* 1.78* 1.29* 1.52*  CALCIUM  --    < > 7.8* 8.4* 8.6* 7.0* 8.7*  MG 2.3  --   --   --   --   --   --    < > = values in this interval not displayed.   GFR: Estimated Creatinine Clearance: 46.9 mL/min (A) (by C-G formula based on SCr of 1.52 mg/dL (H)). Liver Function Tests: Recent Labs  Lab 04/25/18 0949  AST 170*  ALT 145*  ALKPHOS 115  BILITOT 1.5*  PROT 5.5*  ALBUMIN 3.4*   No results for input(s): LIPASE, AMYLASE in the last 168 hours. No results for input(s): AMMONIA in the last  168 hours. Coagulation Profile: No results for input(s): INR, PROTIME in the last 168 hours. Cardiac Enzymes: No results for input(s): CKTOTAL, CKMB, CKMBINDEX, TROPONINI in the last 168 hours. BNP (last 3 results) No results for input(s): PROBNP in the last 8760 hours. HbA1C: No results for  input(s): HGBA1C in the last 72 hours. CBG: No results for input(s): GLUCAP in the last 168 hours. Lipid Profile: No results for input(s): CHOL, HDL, LDLCALC, TRIG, CHOLHDL, LDLDIRECT in the last 72 hours. Thyroid Function Tests: No results for input(s): TSH, T4TOTAL, FREET4, T3FREE, THYROIDAB in the last 72 hours. Anemia Panel: No results for input(s): VITAMINB12, FOLATE, FERRITIN, TIBC, IRON, RETICCTPCT in the last 72 hours. Urine analysis: No results found for: COLORURINE, APPEARANCEUR, LABSPEC, PHURINE, GLUCOSEU, HGBUR, BILIRUBINUR, KETONESUR, PROTEINUR, UROBILINOGEN, NITRITE, LEUKOCYTESUR Sepsis Labs: @LABRCNTIP (procalcitonin:4,lacticidven:4) ) Recent Results (from the past 240 hour(s))  Culture, body fluid-bottle     Status: None   Collection Time: 04/25/18  3:28 PM  Result Value Ref Range Status   Specimen Description FLUID PERITONEAL  Final   Special Requests BOTTLES DRAWN AEROBIC AND ANAEROBIC  Final   Culture   Final    NO GROWTH 5 DAYS Performed at [facility], [address] [place], [place] [address]    Report Status 04/30/2018 FINAL  Final  Gram stain     Status: None   Collection Time: 04/25/18  3:28 PM  Result Value Ref Range Status   Specimen Description FLUID PERITONEAL  Final   Special Requests NONE  Final   Gram Stain   Final    WBC PRESENT,BOTH PMN AND MONONUCLEAR NO ORGANISMS SEEN CYTOSPIN SMEAR Performed at [facility], [address], [place], [place] [address]    Report Status 04/25/2018 FINAL  Final         Radiology Studies: No results found.    Scheduled Meds: . allopurinol  100 mg Oral Daily  . amiodarone  200 mg Oral BID  .  apixaban  5 mg Oral BID  . diclofenac sodium  2 g Topical QID  . docusate sodium  100 mg Oral Daily  . doxycycline  100 mg Oral Q12H  . feeding supplement (ENSURE ENLIVE)  237 mL Oral BID BM  . iron polysaccharides  150 mg Oral Daily  . multivitamin with minerals  1 tablet Oral Daily  . potassium chloride  60 mEq Oral Q4H  . [START ON 05/01/2018] senna  1 tablet Oral BID  . sertraline  200 mg Oral Daily  . sodium chloride flush  10-40 mL Intracatheter Q12H  . sodium chloride flush  3 mL Intravenous Q12H  . spironolactone  50 mg Oral BID  . terbinafine  250 mg Oral Daily   Continuous Infusions: . sodium chloride    . furosemide 120 mg (04/30/18 1254)  . milrinone 0.5 mcg/kg/min (04/30/18 1306)     LOS: 5 days      Debbe Odea, MD [facility] Pager: [email or web address] Password [facility] 04/30/2018, 2:03 PM

## 2018-04-30 NOTE — Progress Notes (Signed)
Palliative:  I have touched base with patient and family (he is bathing currently) and plan to meet back with them this afternoon after his other daughter has arrived. Thank you for this consult.   Vinie Sill, NP Palliative Medicine Team Pager # [phone] (M-F 8a-5p) Team Phone # [phone] (Nights/Weekends)

## 2018-04-30 NOTE — Progress Notes (Addendum)
Patient ID: Antonio Carrillo, male   DOB: Apr 08, 1940, 78 y.o.   MRN: [id number]     Advanced Heart Failure Rounding Note  PCP-Cardiologist: No primary care provider on file.   Subjective:    Admitted from clinic 04/25/2018 with marked volume overloaded and abdominal distention. PICC placed.   Yesterday lasix and metolazone held with hyponatremia and hypokalemia.   Coox 98% (false) on milrinone 0.5 mcg/kg/min.   Creatinine trending down 1.67 -> 1.29. Na 119 -> 131. ? Accuracy.   CVP 17. Feeling slightly better this am. Swelling mildly improved, and biggest improvement is his appetite. He still has many questions and concerns about his prognosis. Daughter present. More family coming throughout the week.   Echo 04/25/2018: LV EF 55-60%, D-shaped interventricular septum with RV enlargement and dysfunction, severe TR.   Objective:   Weight Range: 97.2 kg Body mass index is 29.89 kg/m.   Vital Signs:   Temp:  [97.6 F (36.4 C)-98.6 F (37 C)] 98.6 F (37 C) (01/28 0811) Pulse Rate:  [60-113] 60 (01/28 0811) Resp:  [18-25] 20 (01/28 0811) BP: (105-125)/(56-69) 105/56 (01/28 0811) SpO2:  [94 %-98 %] 94 % (01/28 0811) Weight:  [97.2 kg-97.8 kg] 97.2 kg (01/28 0345) Last BM Date: 04/25/18  Weight change: Filed Weights   04/29/18 0151 04/29/18 1609 04/30/18 0345  Weight: 99.2 kg 97.8 kg 97.2 kg   Intake/Output:   Intake/Output Summary (Last 24 hours) at 04/30/2018 0844 Last data filed at 04/29/2018 2331 Gross per 24 hour  Intake 440 ml  Output 1455 ml  Net -1015 ml    Physical Exam   CVP 17, personally checked.  General: Appears chronically ill. NAD HEENT: Normal Neck: Supple. JVP to ear. Carotids 2+ bilat; no bruits. No thyromegaly or nodule noted. Cor: PMI nondisplaced. Irregularly irregular, No M/G/R noted Lungs: CTAB, normal effort. Abdomen: Soft, non-tender, non-distended, no HSM. No bruits or masses. +BS  Extremities: No cyanosis, clubbing, or rash. 2-3+ BLE edema.  UNNA boots present. RUE PICC site stable.  Neuro: Alert & orientedx3, cranial nerves grossly intact. moves all 4 extremities w/o difficulty. Affect pleasant   Telemetry   ? Afib 60s, personally reviewed.   Labs    CBC Recent Labs    04/29/18 1136 04/30/18 0437  WBC 15.1* 15.2*  NEUTROABS  --  11.6*  HGB 7.0* 7.2*  HCT 22.8* 23.7*  MCV 89.4 89.8  PLT 195 947   Basic Metabolic Panel Recent Labs    04/28/18 0857  04/29/18 1931 04/30/18 0437  NA  --    < > 128* 131*  K  --    < > 3.9 2.9*  CL  --    < > 88* 96*  CO2  --    < > 27 24  GLUCOSE  --    < > 182* 136*  BUN  --    < > 44* 36*  CREATININE  --    < > 1.78* 1.29*  CALCIUM  --    < > 8.6* 7.0*  MG 2.3  --   --   --    < > = values in this interval not displayed.   Liver Function Tests No results for input(s): AST, ALT, ALKPHOS, BILITOT, PROT, ALBUMIN in the last 72 hours. No results for input(s): LIPASE, AMYLASE in the last 72 hours. Cardiac Enzymes No results for input(s): CKTOTAL, CKMB, CKMBINDEX, TROPONINI in the last 72 hours.  BNP: BNP (last 3 results) Recent Labs  04/25/18 0949  BNP 1,544.3*    ProBNP (last 3 results) No results for input(s): PROBNP in the last 8760 hours.   D-Dimer No results for input(s): DDIMER in the last 72 hours. Hemoglobin A1C No results for input(s): HGBA1C in the last 72 hours. Fasting Lipid Panel No results for input(s): CHOL, HDL, LDLCALC, TRIG, CHOLHDL, LDLDIRECT in the last 72 hours. Thyroid Function Tests No results for input(s): TSH, T4TOTAL, T3FREE, THYROIDAB in the last 72 hours.  Invalid input(s): FREET3  Other results:   Imaging    No results found.   Medications:     Scheduled Medications: . allopurinol  100 mg Oral Daily  . amiodarone  200 mg Oral BID  . apixaban  5 mg Oral BID  . diclofenac sodium  2 g Topical QID  . doxycycline  100 mg Oral Q12H  . feeding supplement (ENSURE ENLIVE)  237 mL Oral BID BM  . iron polysaccharides  150  mg Oral Daily  . multivitamin with minerals  1 tablet Oral Daily  . polyethylene glycol  17 g Oral Daily  . potassium chloride  60 mEq Oral Q4H  . sertraline  200 mg Oral Daily  . sodium chloride flush  10-40 mL Intracatheter Q12H  . sodium chloride flush  3 mL Intravenous Q12H  . spironolactone  25 mg Oral BID  . terbinafine  250 mg Oral Daily    Infusions: . sodium chloride    . milrinone 0.5 mcg/kg/min (04/30/18 0450)    PRN Medications: sodium chloride, acetaminophen **OR** acetaminophen, bisacodyl, docusate sodium, HYDROcodone-acetaminophen, lidocaine, lidocaine, LORazepam, ondansetron **OR** ondansetron (ZOFRAN) IV, prochlorperazine, senna, sodium chloride flush, sodium chloride flush  Patient Profile   Antonio Carrillo is a 78 y.o. male with h/o AF s/p several ablations, aortic root aneurysm, s/p aortic root replacement with CABG 1, RV failure, CLL, and ascites.   Sent for admission from HF clinic 04/25/2018 with marked volume overload on exam, RLE wound, and marked ascites.   Assessment/Plan   1. 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 [facility] 4/[facility] with normal LV function. RV severely dilated and mild RV dysfunction.  Echo this admission with LV EF 55-60%, RV dilated and dysfunctional, severe TR, D-shaped septum.  - CO-OX falsely high. Repeat pending on milrinone 0.5 mcg/kg/min.  - CVP 17. Will give 120 mg IV lasix BID today and follow electrolytes closely.  - Weight shows down 1 lb.  - Increase spiro to 50 mg BID - May need repeat paracentesis this admission.  - Long-term prognosis is quite poor.  With RV failure, not candidate for LVAD and also would be poor HD candidate. Palliative Care consult pending  - Per Dr Haroldine Laws ->. For now he is hopeful that he may get some recovery. He was very satisfied with his QOL recently and would like to get back to that. We will continue inotrope support and  diuresis.  2. PAF: S/p multiple ablations and MAZE.  - Appears to be in Afib in the 62s. Continue to follow closely.  - EKG 04/29/2018 with Afib 60s. - Continue amiodarone 200 mg BID while on milrinone.   - Now off digoxin. Will not restart for now with AK - Continue Eliquis.  3. CLL: Per Oncology.  Now s/p pelvic XRT. No change.  4. CAD and CABG x 1:  - No s/s of ischemia.    -  No ASA given apixaban use.  5. Aortic root aneurysm s/p repair  6. RLE laceration: Appreciate WOC and Ortho input. No fevers or chills.  - Management per Ortho. Appreciate their input.  7. Iron deficiency Anemia: No overt bleeding. Remains on apixaban.   - With marked volume overload, would not transfuse until Hgb < 7.  Transferrin saturation low. Hgb 7.2 this am.  - Received feraheme 11/25.  8. Ascites: Persists.  S/p 2 paracenteses recently with 4.1 and 1.3 L out. Will likely need repeat this week with continued diuresis. 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 Cytology from paracentesis has repeatedly been negative for malignancy 9. Hyponatremia:  - Sodium 119 -> 131 with dose of tolvaptan. ? Accuracy. Continue to follow.  - Free water restrict.  10. Hypokalemia - K back to 2.9. Aggressive supp and repeat BMET ordered.  - Increase spironolactone to 50 mg BID  Annamaria Helling  04/30/2018 8:44 AM   Advanced Heart Failure Team Pager [phone] (M-F; 7a - 4p)  Please contact [facility] for night-coverage after hours (4p -7a ) and weekends on [email or web address]  Patient seen and examined with the above-signed Advanced Practice Provider and/or Housestaff. I personally reviewed laboratory data, imaging studies and relevant notes. I independently examined the patient and formulated the important aspects of the plan. I have edited the note to reflect any of my changes or salient points. I have personally discussed the plan with the patient and/or family.   Remains on milrinone 0.5 (he  was accidentally getting higher dose overnight). Off lasix gtt due to hypokalemia. I gave him one dose 80 IV last night. He is down 4 pounds. CVP now 16-17 range down from 22. Co-ox 76%. Creatinine continues to improve. K low.  Will continue IV lasix today. Increase spiro to 50 bid. I wrapped his RLE personally with ACE wrap. Will continue diuresis until CVP down < 10 then wean milrinone. Hyponatremia improving with free water restriction.   Glori Bickers, MD  1:33 PM

## 2018-04-30 NOTE — Consult Note (Signed)
Consultation Note Date: 04/30/2018   Patient Name: Antonio Carrillo  DOB: 12/22/1940  MRN: [id number]  Age / Sex: 78 y.o., male  PCP: Patient, No Pcp Per Referring Physician: Debbe Odea, MD  Reason for Consultation: Establishing goals of care  HPI/Patient Profile: 78 y.o. male  with past medical history of CLL, HTN, HLD, RV failure, atrial fibrillation (on Eliquis), s/p multiple ablations, s/p CABG, severe right ventricular failure, recurrent ascites admitted on 04/25/2018 with leg swelling and weight gain 40 lbs x 1 month. Now inotrope dependent and continues with aggressive diuresis although renal function has improved.   Clinical Assessment and Goals of Care: I met today at Antonio Carrillo bedside. Earlier he was bathing so I told him I would return this afternoon. This afternoon he is sleeping soundly. I did not awaken him. I met with wife and 2 daughters at bedside. We discussed overall prognosis and they are hopeful for improvement. He has shown some improvement but overall prognosis remains poor with advanced heart failure and CKD. They understand that they are seeing some improvement and this makes this hopeful but they also acknowledge that he is on a lot of support to get him this improvement. We discussed natural trajectory and barriers with heart failure.   Their main concern as a family is that he has support and his wife has support at home. We discussed home health, outpatient palliative, and private caregivers. They understand that we will not know the level of support he requires until closer to discharge and that there is still much to accomplish before he would be stable enough for discharge. They are anxious to have close follow up and monitoring upon discharge. Wife is main caregiver as daughters live in [place] and [place] so nobody is very close to provide a lot of support and assistance.  Will need to explore resources available in [place] area. Would benefit from outpatient palliative services via [facility] but will need to be approved since he is not connected with [facility]. Paramedicine coverage in [place]??  I will return tomorrow to speak further with Antonio Carrillo and discuss his [employer] and concerns.   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 Family feel if they can continue to incorporate piano lessons in some way this will greatly improve his QOL.   Primary Decision Maker PATIENT    SUMMARY OF RECOMMENDATIONS   - Need to maximize resources to support patient/family - Will further discuss GOC with patient  Code Status/Advance Care Planning:  Full code - did not address today but plan to speak with patient tomorrow   Symptom Management:   Per heart failure.   Palliative Prophylaxis:   Bowel Regimen and Delirium Protocol  Additional Recommendations (Limitations, Scope, Preferences):  Full Scope Treatment  Psycho-social/Spiritual:   Desire for further Chaplaincy support:no  Additional Recommendations: Caregiving  Support/Resources, Education on Hospice and Grief/Bereavement Support  Prognosis:   Unable to  determine - overall prognosis extremely poor but he is showing some improvement. I fear this improvement is transient. Family aware of this concern.   Discharge Planning: To Be Determined      Primary Diagnoses: Present on Admission: . Acute on chronic combined systolic and diastolic CHF (congestive heart failure) ([place]) . AKI (acute kidney injury) ([place]) . Hyperkalemia . Ascites . CLL (chronic lymphocytic leukemia) ([place]) . Anemia . ATRIAL FIBRILLATION   I have reviewed the medical record, interviewed the patient and family, and examined the patient. The following aspects are pertinent.  Past Medical History:  Diagnosis Date  .  Anticoagulants causing adverse effect in therapeutic use   . Aortic aneurysm [facility])    s/p surgery at [facility]  . HTN (hypertension)   . Hyperlipidemia   . Hyperthyroidism    resolved per pt  . 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 Persistent atrial fibrillation    s/p ablations x3  . Testicular failure    Social History   Socioeconomic History  . Marital status: Married    Spouse name: Not on file  . Number of children: Not on file  . Years of education: Not on file  . Highest education level: Not on file  Occupational History  . 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 Financial resource strain: Not on file  . Food insecurity:    Worry: Not on file    Inability: Not on file  . Transportation needs:    Medical: Not on file    Non-medical: Not on file  Tobacco Use  . Smoking status: Never Smoker  . Smokeless tobacco: Never Used  Substance and Sexual Activity  . Alcohol use: No  . Drug use: No  . Sexual activity: Yes    Partners: Female  Lifestyle  . Physical activity:    Days per week: Not on file    Minutes per session: Not on file  . Stress: Not on file  Relationships  . Social connections:    Talks on phone: Not on file    Gets together: Not on file    Attends religious service: Not on file    Active member of club or organization: Not on file    Attends meetings of clubs or organizations: Not on file    Relationship status: Not on file  Other Topics Concern  . Not on file  Social History Narrative   Lives in [place].   Family History  Problem Relation Age of Onset  . Arrhythmia Mother    Scheduled Meds: . allopurinol  100 mg Oral Daily  . amiodarone  200 mg Oral BID  . apixaban  5 mg Oral BID  . diclofenac sodium  2 g Topical QID  . docusate sodium  100 mg Oral Daily  . doxycycline  100 mg Oral Q12H  . feeding supplement (ENSURE ENLIVE)  237 mL Oral BID BM  . iron polysaccharides  150 mg  Oral Daily  . multivitamin with minerals  1 tablet Oral Daily  . potassium chloride  60 mEq Oral Q4H  . [START ON 05/01/2018] senna  1 tablet Oral BID  . sertraline  200 mg Oral Daily  . sodium chloride flush  10-40 mL Intracatheter Q12H  . sodium chloride flush  3 mL Intravenous Q12H  . spironolactone  50 mg Oral BID  . terbinafine  250 mg Oral Daily   Continuous Infusions: .  sodium chloride    . furosemide 120 mg (04/30/18 1254)  . milrinone 0.5 mcg/kg/min (04/30/18 1306)   PRN Meds:.sodium chloride, acetaminophen **OR** acetaminophen, bisacodyl, docusate sodium, HYDROcodone-acetaminophen, lidocaine, lidocaine, LORazepam, ondansetron **OR** ondansetron (ZOFRAN) IV, prochlorperazine, senna, sodium chloride flush, sodium chloride flush No Known Allergies Review of Systems  Unable to perform ROS: Other  Constitutional:       Patient sleeping    Physical Exam Vitals signs and nursing note reviewed.  Constitutional:      Appearance: He is ill-appearing.  Cardiovascular:     Rate and Rhythm: Normal rate.  Pulmonary:     Effort: Pulmonary effort is normal. No tachypnea, accessory muscle usage or respiratory distress.  Neurological:     Comments: Sleeping      Vital Signs: BP 117/68   Pulse 62   Temp 97.9 F (36.6 C) (Oral)   Resp 15   Ht '5\' 11"'  (1.803 m)   Wt 97.2 kg   SpO2 98%   BMI 29.89 kg/m  Pain Scale: 0-10 POSS *See Group Information*: 1-Acceptable,Awake and alert Pain Score: Asleep   SpO2: SpO2: 98 % O2 Device:SpO2: 98 % O2 Flow Rate: .O2 Flow Rate (L/min): 2 L/min  IO: Intake/output summary:   Intake/Output Summary (Last 24 hours) at 04/30/2018 1439 Last data filed at 04/30/2018 1300 Gross per 24 hour  Intake 440 ml  Output 1825 ml  Net -1385 ml    LBM: Last BM Date: 04/25/18 Baseline Weight: Weight: 101.3 kg Most recent weight: Weight: 97.2 kg     Palliative Assessment/Data: 50%   Flowsheet Rows     Most Recent Value  Intake Tab  Referral  Department  Cardiology  Unit at Time of Referral  Cardiac/Telemetry Unit  Date Notified  04/26/18  Palliative Care Type  New Palliative care  Reason for referral  Clarify Goals of Care  Date of Admission  04/25/18  # of days IP prior to Palliative referral  1  Clinical Assessment  Psychosocial & Spiritual Assessment  Palliative Care Outcomes      Time In: 1400 Time Out: 1520 Time Total: 80 min Greater than 50%  of this time was spent counseling and coordinating care related to the above assessment and plan.  Signed by: Vinie Sill, NP Palliative Medicine Team Pager # [phone] (M-F 8a-5p) Team Phone # [phone] (Nights/Weekends)

## 2018-05-01 LAB — CBC WITH DIFFERENTIAL/PLATELET
Abs Immature Granulocytes: 0.11 K/uL — ABNORMAL HIGH (ref 0.00–0.07)
Basophils Absolute: 0 K/uL (ref 0.0–0.1)
Basophils Relative: 0 %
Eosinophils Absolute: 0.1 K/uL (ref 0.0–0.5)
Eosinophils Relative: 1 %
HCT: 24.7 % — ABNORMAL LOW (ref 39.0–52.0)
Hemoglobin: 7.4 g/dL — ABNORMAL LOW (ref 13.0–17.0)
Immature Granulocytes: 1 %
Lymphocytes Relative: 12 %
Lymphs Abs: 1.8 K/uL (ref 0.7–4.0)
MCH: 26.9 pg (ref 26.0–34.0)
MCHC: 30 g/dL (ref 30.0–36.0)
MCV: 89.8 fL (ref 80.0–100.0)
Monocytes Absolute: 1.5 K/uL — ABNORMAL HIGH (ref 0.1–1.0)
Monocytes Relative: 10 %
Neutro Abs: 11.8 K/uL — ABNORMAL HIGH (ref 1.7–7.7)
Neutrophils Relative %: 76 %
Platelets: 188 K/uL (ref 150–400)
RBC: 2.75 MIL/uL — ABNORMAL LOW (ref 4.22–5.81)
RDW: 16.6 % — ABNORMAL HIGH (ref 11.5–15.5)
WBC: 15.3 K/uL — ABNORMAL HIGH (ref 4.0–10.5)
nRBC: 0 % (ref 0.0–0.2)

## 2018-05-01 LAB — BASIC METABOLIC PANEL WITH GFR
Anion gap: 13 (ref 5–15)
BUN: 42 mg/dL — ABNORMAL HIGH (ref 8–23)
CO2: 26 mmol/L (ref 22–32)
Calcium: 8.9 mg/dL (ref 8.9–10.3)
Chloride: 93 mmol/L — ABNORMAL LOW (ref 98–111)
Creatinine, Ser: 1.49 mg/dL — ABNORMAL HIGH (ref 0.61–1.24)
GFR calc Af Amer: 51 mL/min — ABNORMAL LOW
GFR calc non Af Amer: 44 mL/min — ABNORMAL LOW
Glucose, Bld: 162 mg/dL — ABNORMAL HIGH (ref 70–99)
Potassium: 4.5 mmol/L (ref 3.5–5.1)
Sodium: 132 mmol/L — ABNORMAL LOW (ref 135–145)

## 2018-05-01 LAB — PHOSPHORUS: Phosphorus: 3 mg/dL (ref 2.5–4.6)

## 2018-05-01 LAB — COOXEMETRY PANEL
Carboxyhemoglobin: 0.6 % (ref 0.5–1.5)
Methemoglobin: 0.6 % (ref 0.0–1.5)
O2 Saturation: 63.5 %
Total hemoglobin: 7.7 g/dL — ABNORMAL LOW (ref 12.0–16.0)

## 2018-05-01 LAB — MAGNESIUM: Magnesium: 2.4 mg/dL (ref 1.7–2.4)

## 2018-05-01 MED ORDER — SPIRONOLACTONE 25 MG PO TABS
25.0000 mg | ORAL_TABLET | Freq: Two times a day (BID) | ORAL | Status: DC
  Administered 2018-05-01 – 2018-05-02 (×2): 25 mg via ORAL
  Filled 2018-05-01 (×2): qty 1

## 2018-05-01 MED ORDER — FUROSEMIDE 10 MG/ML IJ SOLN
120.0000 mg | Freq: Two times a day (BID) | INTRAVENOUS | Status: DC
  Administered 2018-05-01 – 2018-05-02 (×2): 120 mg via INTRAVENOUS
  Filled 2018-05-01 (×2): qty 10
  Filled 2018-05-01: qty 12

## 2018-05-01 MED ORDER — LORAZEPAM 1 MG PO TABS
1.0000 mg | ORAL_TABLET | Freq: Every day | ORAL | Status: DC
  Administered 2018-05-01 – 2018-05-04 (×4): 1 mg via ORAL
  Administered 2018-05-06: 0.5 mg via ORAL
  Administered 2018-05-06: 1 mg via ORAL
  Administered 2018-05-07: 0.5 mg via ORAL
  Filled 2018-05-01 (×8): qty 1

## 2018-05-01 NOTE — Progress Notes (Signed)
Palliative:  I met today with Mr. Antonio Carrillo with his daughter, Apolonio Schneiders, at bedside. Wife, Pamala Hurry, joined [place] mid conversation. Mr. Bembenek has excellent grasp of his health situation and overall poor prognosis with RV failure. He is very hopeful for improvement and hopes to get back to his previous QOL. 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 He values his independence and QOL. He is very clear that at the end of his life he does not wish to suffer or even be aware that he is dying. He would like to have palliative sedation to control his symptoms and anxiety to maintain his dignity at end of life. With this goal in mind I clarified code status in which he endorses DNR. He says that he felt if he could be resuscitated to recover to a QOL he would be willing BUT he understands that if current interventions and aggressive care does not help him to improve then he is unlikely to benefit from resuscitative efforts.   Mr. Valvano also explains that he feels he has lived his life in which he has few regrets. He finds joy in volunteering and giving back to his community. He is working to have peace with approaching the end of his life. However, he does feel that having to face these thoughts and discussions have increased his anxiety. He is requesting assistance with better managing his anxiety and insomnia. He has failed melatonin, ambien, trazodone for insomnia.   Exam: Alert, oriented. No distress. Room air. Good spirits.   Plan: - Optimize resources for assistance at home. CMRN to work to find available resources (paramedicine program? Or similar, home health).  - Would benefit from outpatient palliative services via [facility] and patient/family would appreciate this support.  - Insomnia (chronic but also likely worsened by increased anxiety): Lorazepam 1 mg po qhs trial. Monitor for  hypotension, confusion/AMS.  - Anxiety: Utilize ativan qhs. Continue prn ativan 0.5 mg po every 8 hours prn in daytime. Continue Zoloft - at max dose.   79 min  Vinie Sill, NP Palliative Medicine Team Pager # [phone] (M-F 8a-5p) Team Phone # [phone] (Nights/Weekends)

## 2018-05-01 NOTE — Progress Notes (Addendum)
Patient ID: Antonio Carrillo, male   DOB: 08/17/1940, 78 y.o.   MRN: [id number]     Advanced Heart Failure Rounding Note  PCP-Cardiologist: No primary care provider on file.   Subjective:    Admitted from clinic 04/25/2018 with marked volume overloaded and abdominal distention. PICC placed.   K supped up to 4.5. CVP 15-16 this am. Weight down 4 lbs on lasix 120 mg IV BID.   Coox 63.5% on milrinone 0.5 mcg/kg/min. Na 132. Cr relatively stable at 1.49.  Feeling somewhat better this am. Had several bowel movements. Legs remain swollen. Appetite somewhat improved.   Echo 04/25/2018: LV EF 55-60%, D-shaped interventricular septum with RV enlargement and dysfunction, severe TR.   Objective:   Weight Range: 95.3 kg Body mass index is 29.32 kg/m.   Vital Signs:   Temp:  [97.8 F (36.6 C)-97.9 F (36.6 C)] 97.8 F (36.6 C) (01/29 0422) Pulse Rate:  [62] 62 (01/28 1230) Resp:  [15-20] 20 (01/29 0422) BP: (110-135)/(49-68) 110/49 (01/29 0422) SpO2:  [96 %-98 %] 96 % (01/29 0422) Weight:  [95.3 kg] 95.3 kg (01/29 0140) Last BM Date: 05/01/18  Weight change: Filed Weights   04/29/18 1609 04/30/18 0345 05/01/18 0140  Weight: 97.8 kg 97.2 kg 95.3 kg   Intake/Output:   Intake/Output Summary (Last 24 hours) at 05/01/2018 0832 Last data filed at 05/01/2018 0350 Gross per 24 hour  Intake 657.56 ml  Output 1725 ml  Net -1067.44 ml    Physical Exam   CVP 15-16, checked personally.   General: Chronically ill appearing, NAD.  HEENT: Normal Neck: Supple. JVP elevated to ear. Carotids 2+ bilat; no bruits. No thyromegaly or nodule noted. Cor: PMI nondisplaced. Irregularly irregular, No M/G/R.  Lungs: Diminished basilar sounds.  Abdomen: Soft, non-tender, non-distended, no HSM. No bruits or masses. +BS  Extremities: No cyanosis, clubbing, or rash. 2-3+ BLE edema into thighs. UNNA boots present. RUE PICC site C/D/I.  Neuro: Alert & orientedx3, cranial nerves grossly intact. moves all 4  extremities w/o difficulty. Affect pleasant   Telemetry   ?NSR 60-70s, personally reviewed.   Labs    CBC Recent Labs    04/29/18 1136 04/30/18 0437  WBC 15.1* 15.2*  NEUTROABS  --  11.6*  HGB 7.0* 7.2*  HCT 22.8* 23.7*  MCV 89.4 89.8  PLT 195 702   Basic Metabolic Panel Recent Labs    04/28/18 0857  04/30/18 1300 05/01/18 0303  NA  --    < > 128* 132*  K  --    < > 4.1 4.5  CL  --    < > 88* 93*  CO2  --    < > 27 26  GLUCOSE  --    < > 198* 162*  BUN  --    < > 42* 42*  CREATININE  --    < > 1.52* 1.49*  CALCIUM  --    < > 8.7* 8.9  MG 2.3  --   --   --    < > = values in this interval not displayed.   Liver Function Tests No results for input(s): AST, ALT, ALKPHOS, BILITOT, PROT, ALBUMIN in the last 72 hours. No results for input(s): LIPASE, AMYLASE in the last 72 hours. Cardiac Enzymes No results for input(s): CKTOTAL, CKMB, CKMBINDEX, TROPONINI in the last 72 hours.  BNP: BNP (last 3 results) Recent Labs    04/25/18 0949  BNP 1,544.3*    ProBNP (last 3 results) No results  for input(s): PROBNP in the last 8760 hours.   D-Dimer No results for input(s): DDIMER in the last 72 hours. Hemoglobin A1C No results for input(s): HGBA1C in the last 72 hours. Fasting Lipid Panel No results for input(s): CHOL, HDL, LDLCALC, TRIG, CHOLHDL, LDLDIRECT in the last 72 hours. Thyroid Function Tests No results for input(s): TSH, T4TOTAL, T3FREE, THYROIDAB in the last 72 hours.  Invalid input(s): FREET3  Other results:   Imaging    No results found.   Medications:     Scheduled Medications: . allopurinol  100 mg Oral Daily  . amiodarone  200 mg Oral BID  . apixaban  5 mg Oral BID  . diclofenac sodium  2 g Topical QID  . docusate sodium  100 mg Oral Daily  . feeding supplement (ENSURE ENLIVE)  237 mL Oral BID BM  . iron polysaccharides  150 mg Oral Daily  . multivitamin with minerals  1 tablet Oral Daily  . senna  1 tablet Oral BID  . sertraline   200 mg Oral Daily  . sodium chloride flush  10-40 mL Intracatheter Q12H  . sodium chloride flush  3 mL Intravenous Q12H  . spironolactone  50 mg Oral BID  . terbinafine  250 mg Oral Daily    Infusions: . sodium chloride    . furosemide 120 mg (05/01/18 0818)  . milrinone 0.5 mcg/kg/min (05/01/18 0332)    PRN Medications: sodium chloride, acetaminophen **OR** acetaminophen, bisacodyl, docusate sodium, HYDROcodone-acetaminophen, lidocaine, lidocaine, LORazepam, ondansetron **OR** ondansetron (ZOFRAN) IV, prochlorperazine, senna, sodium chloride flush, sodium chloride flush  Patient Profile   Antonio Carrillo is a 79 y.o. male with h/o AF s/p several ablations, aortic root aneurysm, s/p aortic root replacement with CABG 1, RV failure, CLL, and ascites.   Sent for admission from HF clinic 04/25/2018 with marked volume overload on exam, RLE wound, and marked ascites.   Assessment/Plan   1. 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 [facility]MRI 4/[facility] with normal LV function. RV severely dilated and mild RV dysfunction.  Echo this admission with LV EF 55-60%, RV dilated and dysfunctional, severe TR, D-shaped septum.  - CO-OX 63.5% on  milrinone 0.5 mcg/kg/min.  - CVP 15-16. Continue 120 mg IV lasix BID today and follow electrolytes closely.  - Weight shows down 4 lbs.  - Decrease spiro back to 25 mg BID.  - May need repeat paracentesis this admission.  - Long-term prognosis is quite poor.  With RV failure, not candidate for LVAD and also would be poor HD candidate. Palliative Care consult pending  - Per Dr Antonio Carrillo ->. For now he is hopeful that he may get some recovery. He was very satisfied with his QOL recently and would like to get back to that. We will continue inotrope support and diuresis.  2. PAF: S/p multiple ablations and MAZE.  - Appears to be in and out of AFib/NSR. Continue to follow closely.  - EKG 04/29/2018  with Afib 60s. - Continue amiodarone 200 mg BID while on milrinone.   - Now off digoxin. Will not restart for now with AK - Continue Eliquis.  3. CLL: Per Oncology.  Now s/p pelvic XRT. No change.   4. CAD and CABG x 1:  - No s/s of ischemia.    -  No ASA given apixaban use.     5. Aortic root aneurysm s/p repair  6. RLE laceration: Appreciate WOC  and Ortho input. No fever or chills.  - Management per Ortho. Appreciate their input.  7. Iron deficiency Anemia: No overt bleeding. Remains on apixaban.   - With marked volume overload, would not transfuse until Hgb < 7.  Transferrin saturation low. Hgb pending this am.  - Received feraheme 11/25.  8. Ascites: Persists.  S/p 2 paracenteses recently with 4.1 and 1.3 L out. 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 Cytology from paracentesis has repeatedly been negative for malignancy - Consider repeat paracentesis as needed.  9. Hyponatremia:  - Sodium 119 -> 131 with dose of tolvaptan. 132 today. Continue to follow.  - Free water restrict.  10. Hypokalemia - K up to 4.5. Continue to follow closely.  - Take spiro back to 25 mg BID 11. Hypotension - Noted this am, but while just waking up.  - Consider midodrine.   Making gradual progress. Continue to diurese.   Shirley Friar, PA-C  05/01/2018 8:32 AM   Advanced Heart Failure Team Pager [phone] (M-F; 7a - 4p)  Please contact [facility] for night-coverage after hours (4p -7a ) and weekends on [email or web address]  Patient seen and examined with the above-signed Advanced Practice Provider and/or Housestaff. I personally reviewed laboratory data, imaging studies and relevant notes. I independently examined the patient and formulated the important aspects of the plan. I have edited the note to reflect any of my changes or salient points. I have personally discussed the plan with the patient and/or family.  Remains on milrinone at 0.5 mcg/kg/min for RV support. Diuresing  well on lasix 120 IV bid. Renal function stable. Electrolytes improved. Still with about 20 pounds of excess volume. Will continue IV diuresis. Once full diuresed will need milrinone wean. Maintaining NSR on po amio. Continue Eliquis. Surgery following RLE wound. I re-wrapped RLE with ACE personally this am.   Glori Bickers, MD  12:57 PM

## 2018-05-01 NOTE — Progress Notes (Signed)
PROGRESS NOTE    Antonio Carrillo  ZOX:[id number] DOB: 1940/08/02 DOA: 04/25/2018 PCP: Patient, No Pcp Per   Brief Narrative:  Antonio Carrillo a 78 y.o.malewith medical history significant ofCLL, hypertension, hyperlipidemia, paroxysmal A. 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 Was started on a Milrinone gtt and getting diuresed with Advanced Heart Team Following.   Assessment & Plan:   Principal Problem:   Acute on chronic combined systolic and diastolic CHF (congestive heart failure) (HCC) Active Problems:   ATRIAL FIBRILLATION   AKI (acute kidney injury) (HCC)   Hyperkalemia   Ascites   CLL (chronic lymphocytic leukemia) (HCC)   Anemia   CHF (congestive heart failure) (HCC)   Palliative care encounter   Right foot injury  Acute on chronic combined systolic and diastolic CHF with hyponatremia, ascites, pedal edema - Appreciate management by cardiology  -palliative care discussions underway per cardiology -Currently getting diuresed with IV Lasix 120 mg BID and on Spironolactone 25 mg po BID -C/w Milrinone gtt -Strict I's/O's and Daily Weights -Weight is 8.799 Liters -Weight is down from 223 -> 210 -Continue to Monitor Volume Status Carefully  PAF -Cardiology following -Patient is on Amiodarone 200 BID and Eliquis -Currently Maintaining NSR  AKI (acute kidney injury)  -Baseline < 1- on admission ~ 2.5 -Likely cardiorenal- improving with management of heart failure  Hyperkalemia -Continue to Monitor and and Replace   Right foot wound/Dorsal Laceration- Leukocytosis -Received sutures by Ortho on 1/15 at [facility] along with Keflex - was due to see them on this past Wed but did not make it -1/24   was swollen and weeping profusely likely due to pedal edema  - his wound had dehisced and cellulitis was noted.   - after examining wound,  I consulted ortho, Dr Doreatha Martin - he evaluated the patient and removed the sutures- he recommended an antibiotic and we decided on Doxycyline- - he has ordered wet to dry dressing- the foot has subsequently been wrapped with an ACE bandage -Erythema has resolved and swelling is improving with diuresis- would recommend and Doxycycline be stopped today  -WBC remains stable at 15.3 but likely in the setting of CLL  Anxiety/ Depression - Zoloft daily, Ativan PRN -Palliative Care trying Lorazepam 1 mg qHS for Insomnia  Anemia, chronic Iron Deficiency Anemia -Baseline 8-9- -Hb as been ~ 7-8 range   -Anemia panel shows low normal Ferritin and low Iron saturation with normal IBC - cardiology has given Feraheme  - on Niferex daily as outpt and will continue -Hb/Hct is now 7.4/24.7 and improved from yesterday slightly  Cardiology would like to hold off on transfusion until Hb is < 7 as he is fluid overloaded  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 -With splenomegaly -Follows a [facility] -Note from 12/30 /19 recommends observation only after his treatment and repeat CT in 3 months -Has a mild Leukocytosis at 15.3 but likely suspect from CLL and not infectious as patient is A Febrile   Dry, painful mouth and throat - viscous Lidocaine PRN (was using magic mouthwash for the Lidocaine component at home)  Gout -C/w Allopurinol 100 mg po Daily   L knee pain/ arthritis -C/w Voltaren gel  Elevated TSH - Likely sick euthyroid-  Free T4 is normal  Constipation -C/w Miralax daily and Dulcolax  PRN  Abnormal LFT's -In the setting of Advanced Heart Failure -CMP has not been repeated since 04/25/2018 -Continue to Monitor and repeat CMP in AM   DVT prophylaxis: Anticoagulated with Eliquis Code Status: DO NOT RESUSCITATE Family Communication: Discussed with Daughter at bedside Disposition Plan:  Pending further improvement   Consultants:   Cardiology  Palliative Care  Orthopedic Surgery   Procedures:   Suture removal 1/24  PICC 1/23   Antimicrobials:  Anti-infectives (From admission, onward)   Start     Dose/Rate Route Frequency Ordered Stop   04/30/18 2200  doxycycline (VIBRA-TABS) tablet 100 mg     100 mg Oral Every 12 hours 04/30/18 1401 05/01/18 0808   04/26/18 1330  doxycycline (VIBRA-TABS) tablet 100 mg  Status:  Discontinued     100 mg Oral Every 12 hours 04/26/18 1242 04/30/18 1401   04/25/18 1300  terbinafine (LAMISIL) tablet 250 mg     250 mg Oral Daily 04/25/18 1143       Subjective: Seen and Examined at bedside and states that his legs are slowly improving.  Frustrated and understands that his heart failure is worsening.  No nausea or vomiting.  Still has not been sleeping very well.  No other concerns or complaints at this time  Objective: Vitals:   05/01/18 0422 05/01/18 0810 05/01/18 1056 05/01/18 1450  BP: (!) 110/49 (!) 89/53 (!) 111/58 (!) 113/53  Pulse:   64 62  Resp: 20 19 16  (!) 21  Temp: 97.8 F (36.6 C) 97.9 F (36.6 C)  98.1 F (36.7 C)  TempSrc: Oral Oral  Oral  SpO2: 96% 100% 95% 98%  Weight:      Height:        Intake/Output Summary (Last 24 hours) at 05/01/2018 1802 Last data filed at 05/01/2018 1717 Gross per 24 hour  Intake 1118.44 ml  Output 2501 ml  Net -1382.56 ml   Filed Weights   04/29/18 1609 04/30/18 0345 05/01/18 0140  Weight: 97.8 kg 97.2 kg 95.3 kg   Examination: Physical Exam:  Constitutional: WN/WD overweight Caucasian male NAD and appears agitated and slightly uncomfortable Eyes: Lids and conjunctivae normal, sclerae anicteric  ENMT: External Ears, Nose appear normal. Grossly normal hearing. Mucous membranes are moist.  Neck: Appears normal, supple, no cervical masses, normal ROM, no appreciable thyromegaly; Has some JVD Respiratory: Diminished to auscultation bilaterally, no wheezing, rales, rhonchi  or crackles. Normal respiratory effort and patient is not tachypenic. No accessory muscle use.  Cardiovascular: RRR, no murmurs / rubs / gallops. S1 and S2 auscultated. 2+ LE extremity edema. 2+ pedal pulses. No carotid bruits.  Abdomen: Soft, non-tender, Distended. No masses palpated. No appreciable hepatosplenomegaly. Bowel sounds positive x4.  GU: Deferred. Musculoskeletal: No clubbing / cyanosis of digits/nails. No joint deformity upper and lower extremities. .  Skin: Has a large Right Dorsal Wound on his foot with weeping; Erythema is improving. No induration; Warm and dry.  Neurologic: CN 2-12 grossly intact with no focal deficits. Romberg sign and cerebellar reflexes not assessed.  Psychiatric: Normal judgment and insight. Alert and oriented x 3. Normal mood and appropriate affect.   Data Reviewed: I have personally reviewed following labs and imaging studies  CBC: Recent Labs  Lab 04/26/18 0309 04/27/18 0243 04/29/18 1136 04/30/18 0437 05/01/18 0911  WBC 13.7* 13.8* 15.1* 15.2* 15.3*  NEUTROABS 10.8*  --   --  11.6* 11.8*  HGB 7.7* 7.1* 7.0* 7.2* 7.4*  HCT 25.5* 22.9* 22.8* 23.7* 24.7*  MCV 92.4 90.2  89.4 89.8 89.8  PLT 228 200 195 196 867   Basic Metabolic Panel: Recent Labs  Lab 04/28/18 0857  04/29/18 1136 04/29/18 1931 04/30/18 0437 04/30/18 1300 05/01/18 0303 05/01/18 0911  NA  --    < > 125* 128* 131* 128* 132*  --   K  --    < > 2.9* 3.9 2.9* 4.1 4.5  --   CL  --    < > 84* 88* 96* 88* 93*  --   CO2  --    < > 27 27 24 27 26   --   GLUCOSE  --    < > 193* 182* 136* 198* 162*  --   BUN  --    < > 43* 44* 36* 42* 42*  --   CREATININE  --    < > 1.68* 1.78* 1.29* 1.52* 1.49*  --   CALCIUM  --    < > 8.4* 8.6* 7.0* 8.7* 8.9  --   MG 2.3  --   --   --   --   --   --  2.4  PHOS  --   --   --   --   --   --   --  3.0   < > = values in this interval not displayed.   GFR: Estimated Creatinine Clearance: 47.4 mL/min (A) (by C-G formula based on SCr of 1.49 mg/dL  (H)). Liver Function Tests: Recent Labs  Lab 04/25/18 0949  AST 170*  ALT 145*  ALKPHOS 115  BILITOT 1.5*  PROT 5.5*  ALBUMIN 3.4*   No results for input(s): LIPASE, AMYLASE in the last 168 hours. No results for input(s): AMMONIA in the last 168 hours. Coagulation Profile: No results for input(s): INR, PROTIME in the last 168 hours. Cardiac Enzymes: No results for input(s): CKTOTAL, CKMB, CKMBINDEX, TROPONINI in the last 168 hours. BNP (last 3 results) No results for input(s): PROBNP in the last 8760 hours. HbA1C: No results for input(s): HGBA1C in the last 72 hours. CBG: No results for input(s): GLUCAP in the last 168 hours. Lipid Profile: No results for input(s): CHOL, HDL, LDLCALC, TRIG, CHOLHDL, LDLDIRECT in the last 72 hours. Thyroid Function Tests: No results for input(s): TSH, T4TOTAL, FREET4, T3FREE, THYROIDAB in the last 72 hours. Anemia Panel: No results for input(s): VITAMINB12, FOLATE, FERRITIN, TIBC, IRON, RETICCTPCT in the last 72 hours. Sepsis Labs: No results for input(s): PROCALCITON, LATICACIDVEN in the last 168 hours.  Recent Results (from the past 240 hour(s))  Culture, body fluid-bottle     Status: None   Collection Time: 04/25/18  3:28 PM  Result Value Ref Range Status   Specimen Description FLUID PERITONEAL  Final   Special Requests BOTTLES DRAWN AEROBIC AND ANAEROBIC  Final   Culture   Final    NO GROWTH 5 DAYS Performed at [facility], [place] [address], [place], [place] [address]    Report Status 04/30/2018 FINAL  Final  Gram stain     Status: None   Collection Time: 04/25/18  3:28 PM  Result Value Ref Range Status   Specimen Description FLUID PERITONEAL  Final   Special Requests NONE  Final   Gram Stain   Final    WBC PRESENT,BOTH PMN AND MONONUCLEAR NO ORGANISMS SEEN CYTOSPIN SMEAR Performed at [facility], [address] [place], [place] [address]    Report Status 04/25/2018 FINAL  Final   Radiology Studies: No results  found.  Scheduled Meds: . allopurinol  100 mg Oral Daily  . amiodarone  200 mg Oral BID  . apixaban  5 mg Oral BID  . diclofenac sodium  2 g Topical QID  . docusate sodium  100 mg Oral Daily  . feeding supplement (ENSURE ENLIVE)  237 mL Oral BID BM  . iron polysaccharides  150 mg Oral Daily  . LORazepam  1 mg Oral QHS  . multivitamin with minerals  1 tablet Oral Daily  . sertraline  200 mg Oral Daily  . sodium chloride flush  10-40 mL Intracatheter Q12H  . sodium chloride flush  3 mL Intravenous Q12H  . spironolactone  25 mg Oral BID  . terbinafine  250 mg Oral Daily   Continuous Infusions: . sodium chloride    . furosemide Stopped (05/01/18 1648)  . milrinone 0.5 mcg/kg/min (05/01/18 1717)    LOS: 6 days   Kerney Elbe, DO [facility] PAGER is on [place]  If 7PM-7AM, please contact night-coverage [email or web address] Password [facility] 05/01/2018, 6:02 PM

## 2018-05-02 LAB — COOXEMETRY PANEL
CARBOXYHEMOGLOBIN: 0.3 % — AB (ref 0.5–1.5)
Carboxyhemoglobin: 0.1 % — ABNORMAL LOW (ref 0.5–1.5)
Methemoglobin: 1.3 % (ref 0.0–1.5)
Methemoglobin: 1.1 % (ref 0.0–1.5)
O2 Saturation: 43.1 %
O2 Saturation: 51.5 %
Total hemoglobin: 7.6 g/dL — ABNORMAL LOW (ref 12.0–16.0)
Total hemoglobin: 5.8 g/dL — CL (ref 12.0–16.0)

## 2018-05-02 LAB — COMPREHENSIVE METABOLIC PANEL
ALT: 44 U/L (ref 0–44)
AST: 31 U/L (ref 15–41)
Albumin: 2.9 g/dL — ABNORMAL LOW (ref 3.5–5.0)
Alkaline Phosphatase: 125 U/L (ref 38–126)
Anion gap: 13 (ref 5–15)
BILIRUBIN TOTAL: 0.8 mg/dL (ref 0.3–1.2)
BUN: 45 mg/dL — ABNORMAL HIGH (ref 8–23)
CO2: 29 mmol/L (ref 22–32)
Calcium: 8.5 mg/dL — ABNORMAL LOW (ref 8.9–10.3)
Chloride: 91 mmol/L — ABNORMAL LOW (ref 98–111)
Creatinine, Ser: 1.41 mg/dL — ABNORMAL HIGH (ref 0.61–1.24)
GFR calc Af Amer: 55 mL/min — ABNORMAL LOW (ref >60)
GFR calc non Af Amer: 47 mL/min — ABNORMAL LOW (ref >60)
Glucose, Bld: 142 mg/dL — ABNORMAL HIGH (ref 70–99)
POTASSIUM: 4 mmol/L (ref 3.5–5.1)
Sodium: 133 mmol/L — ABNORMAL LOW (ref 135–145)
Total Protein: 5.1 g/dL — ABNORMAL LOW (ref 6.5–8.1)

## 2018-05-02 LAB — CBC WITH DIFFERENTIAL/PLATELET
Abs Immature Granulocytes: 0.11 10*3/uL — ABNORMAL HIGH (ref 0.00–0.07)
Basophils Absolute: 0 10*3/uL (ref 0.0–0.1)
Basophils Relative: 0 %
Eosinophils Absolute: 0.1 10*3/uL (ref 0.0–0.5)
Eosinophils Relative: 1 %
HCT: 24.6 % — ABNORMAL LOW (ref 39.0–52.0)
Hemoglobin: 7.2 g/dL — ABNORMAL LOW (ref 13.0–17.0)
Immature Granulocytes: 1 %
Lymphocytes Relative: 12 %
Lymphs Abs: 1.9 10*3/uL (ref 0.7–4.0)
MCH: 26.9 pg (ref 26.0–34.0)
MCHC: 29.3 g/dL — ABNORMAL LOW (ref 30.0–36.0)
MCV: 91.8 fL (ref 80.0–100.0)
Monocytes Absolute: 1.5 10*3/uL — ABNORMAL HIGH (ref 0.1–1.0)
Monocytes Relative: 10 %
Neutro Abs: 11.6 10*3/uL — ABNORMAL HIGH (ref 1.7–7.7)
Neutrophils Relative %: 76 %
Platelets: 179 10*3/uL (ref 150–400)
RBC: 2.68 MIL/uL — ABNORMAL LOW (ref 4.22–5.81)
RDW: 16.9 % — AB (ref 11.5–15.5)
WBC: 15.1 10*3/uL — ABNORMAL HIGH (ref 4.0–10.5)
nRBC: 0 % (ref 0.0–0.2)

## 2018-05-02 LAB — MAGNESIUM: Magnesium: 2.5 mg/dL — ABNORMAL HIGH (ref 1.7–2.4)

## 2018-05-02 LAB — PHOSPHORUS: Phosphorus: 3.8 mg/dL (ref 2.5–4.6)

## 2018-05-02 MED ORDER — METOLAZONE 5 MG PO TABS: 5.0000 mg | ORAL_TABLET | Freq: Once | ORAL | Status: DC

## 2018-05-02 MED ORDER — FUROSEMIDE 10 MG/ML IJ SOLN
120.0000 mg | Freq: Two times a day (BID) | INTRAVENOUS | Status: DC
  Administered 2018-05-02 – 2018-05-03 (×3): 120 mg via INTRAVENOUS
  Filled 2018-05-02 (×2): qty 12
  Filled 2018-05-02 (×2): qty 10

## 2018-05-02 MED ORDER — SPIRONOLACTONE 25 MG PO TABS
50.0000 mg | ORAL_TABLET | Freq: Two times a day (BID) | ORAL | Status: DC
  Administered 2018-05-02 – 2018-05-08 (×12): 50 mg via ORAL
  Filled 2018-05-02 (×12): qty 2

## 2018-05-02 MED ORDER — FUROSEMIDE 10 MG/ML IJ SOLN: 20.0000 mg/h | INTRAVENOUS | Status: DC

## 2018-05-02 MED ORDER — METOLAZONE 5 MG PO TABS
2.5000 mg | ORAL_TABLET | Freq: Two times a day (BID) | ORAL | Status: DC
  Administered 2018-05-02 – 2018-05-08 (×12): 2.5 mg via ORAL
  Filled 2018-05-02 (×12): qty 1

## 2018-05-02 MED ORDER — WHITE PETROLATUM EX OINT
TOPICAL_OINTMENT | CUTANEOUS | Status: DC | PRN
  Filled 2018-05-02 (×2): qty 28.35

## 2018-05-02 MED ORDER — POTASSIUM CHLORIDE CRYS ER 20 MEQ PO TBCR
20.0000 meq | EXTENDED_RELEASE_TABLET | Freq: Once | ORAL | Status: AC
  Administered 2018-05-02: 20 meq via ORAL
  Filled 2018-05-02: qty 1

## 2018-05-02 MED ORDER — VANCOMYCIN HCL 10 G IV SOLR
1500.0000 mg | INTRAVENOUS | Status: DC
  Administered 2018-05-02 – 2018-05-06 (×5): 1500 mg via INTRAVENOUS
  Filled 2018-05-02 (×6): qty 1500

## 2018-05-02 NOTE — Progress Notes (Addendum)
Patient ID: Antonio Carrillo, male   DOB: 1940/05/05, 78 y.o.   MRN: [id number]     Advanced Heart Failure Rounding Note  PCP-Cardiologist: No primary care provider on file.   Subjective:    Admitted from clinic 04/25/2018 with marked volume overloaded and abdominal distention. PICC placed.   Continues to diurese with 120 mg IV lasix. Remains on milrinone 0.5 mcg. CO-OX 51%.   Creatinine 1.5>1.4.   Feeling better. Denies SOB. He is concerned about his LLE. Says they are watching the redness.   Echo 04/25/2018: LV EF 55-60%, D-shaped interventricular septum with RV enlargement and dysfunction, severe TR.   Objective:   Weight Range: 94.3 kg Body mass index is 28.98 kg/m.   Vital Signs:   Temp:  [97.8 F (36.6 C)-98.2 F (36.8 C)] 98.2 F (36.8 C) (01/30 0753) Pulse Rate:  [62-109] 109 (01/29 2355) Resp:  [12-24] 12 (01/30 0753) BP: (111-116)/(53-69) 116/57 (01/30 0753) SpO2:  [95 %-100 %] 100 % (01/29 2355) Weight:  [94.3 kg] 94.3 kg (01/30 0440) Last BM Date: 05/01/18  Weight change: Filed Weights   04/30/18 0345 05/01/18 0140 05/02/18 0440  Weight: 97.2 kg 95.3 kg 94.3 kg   Intake/Output:   Intake/Output Summary (Last 24 hours) at 05/02/2018 0915 Last data filed at 05/02/2018 0703 Gross per 24 hour  Intake 607.75 ml  Output 2456 ml  Net -1848.25 ml    Physical Exam   CVp 15-16. Personally checked.  General:   No resp difficulty. In bed.  HEENT: normal Neck: supple. JPV to jaw  Carotids 2+ bilat; no bruits. No lymphadenopathy or thryomegaly appreciated. Cor: PMI nondisplaced. Regular rate & rhythm. No rubs, gallops or murmurs. Lungs: clear Abdomen: soft, nontender, nondistended. No hepatosplenomegaly. No bruits or masses. Good bowel sounds. Extremities: no cyanosis, clubbing, rash, R and LLE 3+ edema with unna boots. LLE erythema noted and marked . RUE PICC Neuro: alert & orientedx3, cranial nerves grossly intact. moves all 4 extremities w/o difficulty. Affect  pleasant   Telemetry   NSR 70s   Labs    CBC Recent Labs    05/01/18 0911 05/02/18 0259  WBC 15.3* 15.1*  NEUTROABS 11.8* 11.6*  HGB 7.4* 7.2*  HCT 24.7* 24.6*  MCV 89.8 91.8  PLT 188 818   Basic Metabolic Panel Recent Labs    05/01/18 0303 05/01/18 0911 05/02/18 0259  NA 132*  --  133*  K 4.5  --  4.0  CL 93*  --  91*  CO2 26  --  29  GLUCOSE 162*  --  142*  BUN 42*  --  45*  CREATININE 1.49*  --  1.41*  CALCIUM 8.9  --  8.5*  MG  --  2.4 2.5*  PHOS  --  3.0 3.8   Liver Function Tests Recent Labs    05/02/18 0259  AST 31  ALT 44  ALKPHOS 125  BILITOT 0.8  PROT 5.1*  ALBUMIN 2.9*   No results for input(s): LIPASE, AMYLASE in the last 72 hours. Cardiac Enzymes No results for input(s): CKTOTAL, CKMB, CKMBINDEX, TROPONINI in the last 72 hours.  BNP: BNP (last 3 results) Recent Labs    04/25/18 0949  BNP 1,544.3*    ProBNP (last 3 results) No results for input(s): PROBNP in the last 8760 hours.   D-Dimer No results for input(s): DDIMER in the last 72 hours. Hemoglobin A1C No results for input(s): HGBA1C in the last 72 hours. Fasting Lipid Panel No results for input(s): CHOL,  HDL, LDLCALC, TRIG, CHOLHDL, LDLDIRECT in the last 72 hours. Thyroid Function Tests No results for input(s): TSH, T4TOTAL, T3FREE, THYROIDAB in the last 72 hours.  Invalid input(s): FREET3  Other results:   Imaging    No results found.   Medications:     Scheduled Medications: . allopurinol  100 mg Oral Daily  . amiodarone  200 mg Oral BID  . apixaban  5 mg Oral BID  . diclofenac sodium  2 g Topical QID  . docusate sodium  100 mg Oral Daily  . feeding supplement (ENSURE ENLIVE)  237 mL Oral BID BM  . iron polysaccharides  150 mg Oral Daily  . LORazepam  1 mg Oral QHS  . multivitamin with minerals  1 tablet Oral Daily  . sertraline  200 mg Oral Daily  . sodium chloride flush  10-40 mL Intracatheter Q12H  . sodium chloride flush  3 mL Intravenous Q12H    . spironolactone  25 mg Oral BID  . terbinafine  250 mg Oral Daily    Infusions: . sodium chloride    . furosemide 120 mg (05/02/18 0824)  . milrinone 0.5 mcg/kg/min (05/02/18 0340)    PRN Medications: sodium chloride, acetaminophen **OR** acetaminophen, bisacodyl, HYDROcodone-acetaminophen, lidocaine, lidocaine, LORazepam, ondansetron **OR** ondansetron (ZOFRAN) IV, prochlorperazine, senna, sodium chloride flush, sodium chloride flush  Patient Profile   Antonio Carrillo is a 78 y.o. male with h/o AF s/p several ablations, aortic root aneurysm, s/p aortic root replacement with CABG 1, RV failure, CLL, and ascites.   Sent for admission from HF clinic 04/25/2018 with marked volume overload on exam, RLE wound, and marked ascites.   Assessment/Plan   1. 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 cMRI 4/18 at [facility] with normal LV function. RV severely dilated and mild RV dysfunction.  Echo this admission with LV EF 55-60%, RV dilated and dysfunctional, severe TR, D-shaped septum.  - CO-OX 51%. On  milrinone 0.5 mcg/kg/min. -CVP 15-16. Continue 120 mg IV lasix twice a day.   Weight down another 3 pounds.  - Continue spiro back to 25 mg BID.  - May need repeat paracentesis this admission.  - Long-term prognosis is quite poor.  With RV failure, not candidate for LVAD and also would be poor HD candidate. Palliative Care consult pending  - Per Dr Haroldine Laws ->. For now he is hopeful that he may get some recovery. He was very satisfied with his QOL recently and would like to get back to that. We will continue inotrope support and diuresis.  2. PAF: S/p multiple ablations and MAZE.  - Appears to be in and out of AFib/NSR. Continue to follow closely.  - EKG 04/29/2018 with Afib 60s. - Continue amiodarone 200 mg BID while on milrinone.   - Now off digoxin. Creatinine improving maybe able to restart soon.  - Continue Eliquis. No  bleeding issues.  3. CLL: Per Oncology.  Now s/p pelvic XRT. No change.   4. CAD and CABG x 1:  - No s/s of ischemia.    -  No ASA given apixaban use.     5. Aortic root aneurysm s/p repair  6. RLE laceration: Appreciate WOC and Ortho input. No fever or chills.  - Management per Ortho. Appreciate their input.  7. Iron deficiency Anemia: No overt bleeding. Remains on apixaban.   - With marked volume overload, would not transfuse until Hgb < 7.  Hgb 7.2.  Transferrin saturation low. Hgb pending this am.  - Received feraheme 11/25.  8. Ascites: Persists.  S/p 2 paracenteses recently with 4.1 and 1.3 L out. 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 Cytology from paracentesis has repeatedly been negative for malignancy - Consider repeat paracentesis as needed.  9. Hyponatremia:  - Sodium dropped to 119.. Improved with tolvaptan.  - Todays sodium is 133.  - Free water restrict.  10. Hypokalemia - Potassium stable.   - Take spiro back to 25 mg BID 11. Hypotension - Stable.  - Consider midodrine.  12. LLE erythema WBC 15. Primary team following. Possible cellulitis. May need antibiotic course.  Keep unna boot off and watch.    Darrick Grinder, NP  05/02/2018 9:15 AM   Advanced Heart Failure Team Pager [phone] (M-F; [place])  Please contact [facility] for night-coverage after hours (4p -7a ) and weekends on [email or web address]  Patient seen and examined with the above-signed Advanced Practice Provider and/or Housestaff. I personally reviewed laboratory data, imaging studies and relevant notes. I independently examined the patient and formulated the important aspects of the plan. I have edited the note to reflect any of my changes or salient points. I have personally discussed the plan with the patient and/or family.  He remains quite tenuous. On milrinone 0.5. CVP still elevated will restart lasix at 120 IV bid and abb back metolazone 2.5 bid. Increase spiro to 50 bid. Co-ox  marginal. LLE concern for cellulitis. Will start vancomycin. Place TED stocking on that leg.  Will continue diuresis for another 15-20 pounds then will need to try and wean milrinone. Maintaining NSR. Continue amio. Continue Eliquis.   Glori Bickers, MD  1:00 PM

## 2018-05-02 NOTE — Progress Notes (Signed)
PROGRESS NOTE    Antonio Carrillo  SWN:[id number] DOB: 07/07/1940 DOA: 04/25/2018 PCP: Patient, No Pcp Per   Brief Narrative:  Antonio Carrillo a 78 Carrillo medical history significant ofCLL, hypertension, hyperlipidemia, paroxysmal A. 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 Was started on a Milrinone gtt and getting diuresed with Advanced Heart Team Following. Left Leg with new warmth and Erythema and ? Venous Stasis changes vs. Cellulitis but Cardiology starting the patient on IV Vancomycin.   Assessment & Plan:   Principal Problem:   Acute on chronic combined systolic and diastolic CHF (congestive heart failure) (HCC) Active Problems:   ATRIAL FIBRILLATION   AKI (acute kidney injury) (HCC)   Hyperkalemia   Ascites   CLL (chronic lymphocytic leukemia) (HCC)   Anemia   CHF (congestive heart failure) (HCC)   Palliative care encounter   Right foot injury  Acute on chronic combined systolic and diastolic CHF with hyponatremia, ascites, pedal edema - Appreciate management by cardiology  -palliative care discussions underway per cardiology -Currently getting diuresed with IV Lasix 120 mg BID and on Spironolactone 25 mg po BID but this to be increased to 50 mg po BID -Cardiology also restarted Metolazone 2.5 mg BID -C/w Milrinone gtt and wean per Cardiology recc's -Strict I's/O's and Daily Weights -ECHOCardiogram on 04/25/2018 showed Normal LV systolic function; mild LVE; mild AI; mild MR; severe   biatrial enlargement; mild RVE with moderate RV dysfunction; severe TR; cannot estimate pulmonary pressures due to severity of TR but D shaped septum suggests pulmonary hypertension. -Weight is 10.662 Liters -Weight is down from 223 -> 207 -Continue to Monitor Volume Status  Carefully  PAF -Cardiology following -Has had Multiple Ablations and MAZE -Patient is on Amiodarone 200 BID and Eliquis -Currently Maintaining NSR but has been in and out of AFib  AKI (acute kidney injury)  -Baseline < 1- on admission ~ 2.5 -Likely cardiorenal- improving with management of heart failure -BUN/Cr is now 45/1.41 -Continue to Monitor and Trend while he is getting diuresed and now that He will be started on Antibiotics with IV Vancomycin  Hypokalemia, improved -K+ is now 4.0 -Continue to Monitor and and Replete as necessary -Repeat CMP ion AM   Right foot wound/Dorsal Laceration- Leukocytosis -Received sutures by Ortho on 1/15 at [facility] along with Keflex - was due to see them on this past Wed but did not make it -1/24   was swollen and weeping profusely likely due to pedal edema - his wound had dehisced and cellulitis was noted.   - after examining wound,  I consulted ortho, Dr Doreatha Martin - he evaluated the patient and removed the sutures- he recommended an antibiotic and we decided on Doxycyline- - he has ordered wet to dry dressing- the foot has subsequently been wrapped with an ACE bandage -Erythema has resolved and swelling is improving with diuresis- would recommend and Doxycycline be stopped today  -WBC remains stable at 15.1 but likely in the setting of CLL  Anxiety/ Depression - Zoloft daily, Ativan PRN -Palliative Care trying Lorazepam 1 mg qHS for Insomnia and improved   Anemia, chronic Iron Deficiency Anemia -Baseline 8-9- -Hb as been ~ 7-8 range   -Anemia panel shows low normal Ferritin and low Iron saturation with normal IBC - cardiology has given Feraheme  - on Niferex  daily as outpt and will continue -Hb/Hct is now 7.2/24.6 and down slightly from yesterday  Cardiology would like to hold off on transfusion until Hb is < 7 as he is fluid overloaded  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 -With splenomegaly -Follows a  [facility] -Note from 12/30 /19 recommends observation only after his treatment and repeat CT in 3 months -Has a mild Leukocytosis at 15.1 but likely suspect from CLL and not infectious as patient is A Febrile   Dry, painful mouth and throat - viscous Lidocaine PRN (was using magic mouthwash for the Lidocaine component at home)  Gout -C/w Allopurinol 100 mg po Daily   L knee pain/ arthritis -C/w Voltaren gel  LLE Erythema and Warmth -Afebrile and has a Mild Leukocytosis -? Venous stasis from Massive Volume Overload vs. True Cellulitis -Elevate Extremities -WOC Nurse to re-evaluate today and deferred to Dr. Haroldine Laws; Had Rolena Infante placed last saturday  -Have asked nurse to mark erythema borders -Was holding off Abx but Cardiology starting IV Vancomycin -Continue to Monitor  Elevated TSH - Likely sick euthyroid-  Free T4 is normal  Constipation -C/w Miralax daily and Dulcolax PRN  Abnormal LFT's, improved  -In the setting of Advanced Heart Failure -CMP has not been repeated since 04/25/2018 but repeat this AM has showed LFT's have resolved.  -Continue to Monitor and repeat CMP in AM   DVT prophylaxis: Anticoagulated with Eliquis Code Status: DO NOT RESUSCITATE Family Communication: Discussed with Daughter at bedside Disposition Plan: Pending further improvement   Consultants:   Cardiology  Palliative Care  Orthopedic Surgery   Procedures:   Suture removal 1/24  PICC 1/23   Antimicrobials:  Anti-infectives (From admission, onward)   Start     Dose/Rate Route Frequency Ordered Stop   05/02/18 1400  vancomycin (VANCOCIN) 1,500 mg in sodium chloride 0.9 % 500 mL IVPB     1,500 mg 250 mL/hr over 120 Minutes Intravenous Every 24 hours 05/02/18 1232     04/30/18 2200  doxycycline (VIBRA-TABS) tablet 100 mg     100 mg Oral Every 12 hours 04/30/18 1401 05/01/18 0808   04/26/18 1330  doxycycline (VIBRA-TABS) tablet 100 mg  Status:  Discontinued     100 mg Oral Every  12 hours 04/26/18 1242 04/30/18 1401   04/25/18 1300  terbinafine (LAMISIL) tablet 250 mg     250 mg Oral Daily 04/25/18 1143       Subjective: Seen and Examined and states he is more mobile since coming in. Nursing concerned about LLE Erythema above Unna boot so I removed the [employer]. No CP noted. States he also slept better yesterday. Concerned that his Left Leg had some erythema today.    Objective: Vitals:   05/01/18 1933 05/01/18 2355 05/02/18 0440 05/02/18 0753  BP: 116/69 (!) 111/55  (!) 116/57  Pulse: 72 (!) 109    Resp: (!) 24 16  12   Temp: 97.8 F (36.6 C) 97.8 F (36.6 C)  98.2 F (36.8 C)  TempSrc: Oral Oral  Oral  SpO2: 98% 100%    Weight:   94.3 kg   Height:        Intake/Output Summary (Last 24 hours) at 05/02/2018 1324 Last data filed at 05/02/2018 1038 Gross per 24 hour  Intake 507.75 ml  Output 2691 ml  Net -2183.25 ml   Filed Weights   04/30/18 0345 05/01/18 0140 05/02/18 0440  Weight: 97.2 kg 95.3 kg 94.3 kg   Examination: Physical  Exam:  Constitutional: Well-nourished, well-developed overweight Caucasian male currently no acute distress appears more calmer today and still appears slightly uncomfortable sitting up at the edge of bedside Eyes: Lids and conjunctive are normal.  Sclera anicteric ENMT: External ears and nose appear normal.  Mucous members are moist Neck: Appears supple with some JVD Respiratory: Diminished auscultation bilaterally with mild crackles at the bases but no appreciable wheezing, rales, rhonchi.  Has normal respiratory effort is not tachypneic or using any accessory muscles to breathe Cardiovascular: Regular rate and rhythm.  Has at least 2+ lower extremity edema noted. Abdomen: Soft, nontender, slightly distended.  Bowel sounds present all 4 quadrants GU: Deferred Musculoskeletal: No contractures or cyanosis.  No joint deformities noted Skin: Has a right dorsal wound infection on his foot with weeping with mild erythema.   Today he has left lower extremity erythema and warmth on the lateral portion.  Skin is warm and dry but does have severe swelling Neurologic: Cranial nerves II through XII grossly intact no appreciable focal deficits Psychiatric: Normal judgment and insight.  Not as agitated today.  Patient is awake and alert and oriented x3  Data Reviewed: I have personally reviewed following labs and imaging studies  CBC: Recent Labs  Lab 04/26/18 0309 04/27/18 0243 04/29/18 1136 04/30/18 0437 05/01/18 0911 05/02/18 0259  WBC 13.7* 13.8* 15.1* 15.2* 15.3* 15.1*  NEUTROABS 10.8*  --   --  11.6* 11.8* 11.6*  HGB 7.7* 7.1* 7.0* 7.2* 7.4* 7.2*  HCT 25.5* 22.9* 22.8* 23.7* 24.7* 24.6*  MCV 92.4 90.2 89.4 89.8 89.8 91.8  PLT 228 200 195 196 188 423   Basic Metabolic Panel: Recent Labs  Lab 04/28/18 0857  04/29/18 1931 04/30/18 0437 04/30/18 1300 05/01/18 0303 05/01/18 0911 05/02/18 0259  NA  --    < > 128* 131* 128* 132*  --  133*  K  --    < > 3.9 2.9* 4.1 4.5  --  4.0  CL  --    < > 88* 96* 88* 93*  --  91*  CO2  --    < > 27 24 27 26   --  29  GLUCOSE  --    < > 182* 136* 198* 162*  --  142*  BUN  --    < > 44* 36* 42* 42*  --  45*  CREATININE  --    < > 1.78* 1.29* 1.52* 1.49*  --  1.41*  CALCIUM  --    < > 8.6* 7.0* 8.7* 8.9  --  8.5*  MG 2.3  --   --   --   --   --  2.4 2.5*  PHOS  --   --   --   --   --   --  3.0 3.8   < > = values in this interval not displayed.   GFR: Estimated Creatinine Clearance: 49.8 mL/min (A) (by C-G formula based on SCr of 1.41 mg/dL (H)). Liver Function Tests: Recent Labs  Lab 05/02/18 0259  AST 31  ALT 44  ALKPHOS 125  BILITOT 0.8  PROT 5.1*  ALBUMIN 2.9*   No results for input(s): LIPASE, AMYLASE in the last 168 hours. No results for input(s): AMMONIA in the last 168 hours. Coagulation Profile: No results for input(s): INR, PROTIME in the last 168 hours. Cardiac Enzymes: No results for input(s): CKTOTAL, CKMB, CKMBINDEX, TROPONINI in the  last 168 hours. BNP (last 3 results) No results for input(s): PROBNP in the  last 8760 hours. HbA1C: No results for input(s): HGBA1C in the last 72 hours. CBG: No results for input(s): GLUCAP in the last 168 hours. Lipid Profile: No results for input(s): CHOL, HDL, LDLCALC, TRIG, CHOLHDL, LDLDIRECT in the last 72 hours. Thyroid Function Tests: No results for input(s): TSH, T4TOTAL, FREET4, T3FREE, THYROIDAB in the last 72 hours. Anemia Panel: No results for input(s): VITAMINB12, FOLATE, FERRITIN, TIBC, IRON, RETICCTPCT in the last 72 hours. Sepsis Labs: No results for input(s): PROCALCITON, LATICACIDVEN in the last 168 hours.  Recent Results (from the past 240 hour(s))  Culture, body fluid-bottle     Status: None   Collection Time: 04/25/18  3:28 PM  Result Value Ref Range Status   Specimen Description FLUID PERITONEAL  Final   Special Requests BOTTLES DRAWN AEROBIC AND ANAEROBIC  Final   Culture   Final    NO GROWTH 5 DAYS Performed at [facility], [place] [address] [place], [place] [address]    Report Status 04/30/2018 FINAL  Final  Gram stain     Status: None   Collection Time: 04/25/18  3:28 PM  Result Value Ref Range Status   Specimen Description FLUID PERITONEAL  Final   Special Requests NONE  Final   Gram Stain   Final    WBC PRESENT,BOTH PMN AND MONONUCLEAR NO ORGANISMS SEEN CYTOSPIN SMEAR Performed at [facility], [address], [place], [place] [address]    Report Status 04/25/2018 FINAL  Final   Radiology Studies: No results found.  Scheduled Meds: . allopurinol  100 mg Oral Daily  . amiodarone  200 mg Oral BID  . apixaban  5 mg Oral BID  . diclofenac sodium  2 g Topical QID  . docusate sodium  100 mg Oral Daily  . feeding supplement (ENSURE ENLIVE)  237 mL Oral BID BM  . iron polysaccharides  150 mg Oral Daily  . LORazepam  1 mg Oral QHS  . metolazone  2.5 mg Oral BID  . multivitamin with minerals  1 tablet Oral Daily  . potassium chloride   20 mEq Oral Once  . sertraline  200 mg Oral Daily  . sodium chloride flush  10-40 mL Intracatheter Q12H  . sodium chloride flush  3 mL Intravenous Q12H  . spironolactone  50 mg Oral BID  . terbinafine  250 mg Oral Daily   Continuous Infusions: . sodium chloride    . furosemide    . milrinone 0.5 mcg/kg/min (05/02/18 0340)  . vancomycin 1,500 mg (05/02/18 1302)    LOS: 7 days   Kerney Elbe, DO [facility] PAGER is on [place]  If 7PM-7AM, please contact night-coverage [email or web address] Password [email or web address] 05/02/2018, 1:24 PM

## 2018-05-02 NOTE — Consult Note (Signed)
[facility] [place] Nurse wound consult note Reason for Consult: re-evaluate leg wound However upon my arrival to the unit and further discussion with the bedside nurse it was determined that the need for was evaluation of the LLE that had an Unna's boot ordered by CHF team. Dr. Haroldine Laws ordered on Saturday Wound type: no open wounds on the LLE Pressure Injury POA: NA Measurement: NA Wound bed:NA Drainage (amount, consistency, odor) None seen at the time of my assessment  Periwound: Leg is edematous, some erythema noted on the lateral aspect of the calf and pretibial region. It has been marked by the bedside nurse. I have discussed with patient/family and NP from the CHF team. No topical care needed at this time.  Dr. Haroldine Laws to see patient and re-evaluate leg.  He will be able to determine if the leg erythema is new and treatment plan. Will not reorder Unna boot at this time.   Discussed POC with patient and bedside nurse.  Re consult if needed, will not follow at this time. Thanks  Darthy Manganelli [employer], RN,CWOCN, CNS, [place] [phone])

## 2018-05-02 NOTE — Care Management Note (Signed)
Case Management Note [name] RN, BSN Transitions of Care Unit 4E- RN Case Manager [phone]  Patient Details  Name: Antonio Carrillo MRN: [id number] Date of Birth: July 29, 1940  Subjective/Objective:    Pt admitted with vol. Overload, HF              Action/Plan: PTA pt lived at home with wife, referral received for transition of care needs, PC community referral. CM spoke with pt and spouse at bedside, discussed pt's goal to return home with GOL. Pt reports that he has needed DME at home for now, is interested in [facility] referral at time of discharge discussed what this type of referral means and what type of services are offered vs Hospice services in the home. Pt and wife state that they would like a referral to [facility] for PC on discharge- CM will call and make this referral prior to discharge. Also discussed [place] services and private duty care- pt has used [facility] with [place] in past , list provided Per CMS guidelines from medicare.gov website with star ratings (copy placed in shadow chart) for them to review for choice- once orders placed CM will f/u for choice and make any referrals needed. Offered to [employer] options for private duty- wife and pt both declined at this time stating that they could do this and f/u with researching resources for this. CM will continue to follow for transition of care needs.   Expected Discharge Date:                  Expected Discharge Plan:  [place]  In-House Referral:  Hospice / Palliative Care  Discharge planning Services  CM Consult  Post Acute Care Choice:    Choice offered to:  Patient, Spouse  DME Arranged:    DME Agency:     HH Arranged:    [place] Agency:     Status of Service:  In process, will continue to follow  If discussed at Long Length of Stay Meetings, dates discussed:    Discharge Disposition:   Additional Comments:  Dawayne Patricia, RN 05/02/2018, 2:02 PM

## 2018-05-02 NOTE — Progress Notes (Signed)
Pharmacy Antibiotic Note  Antonio Carrillo is a 78 y.o. male admitted on 04/25/2018 with cellulitis.  Pharmacy has been consulted for vancomycin dosing. Of note, pt is s/p 5-day course of doxycycline.  Plan: Vancomycin 1500mg  IV q24h Monitor LOT, renal function, cultures Vancomycin levels as needed  Height: 5\' 11"  (180.3 cm) Weight: 207 lb 12.8 oz (94.3 kg) IBW/kg (Calculated) : 75.3  Temp (24hrs), Avg:98 F (36.7 C), Min:97.8 F (36.6 C), Max:98.2 F (36.8 C)  Recent Labs  Lab 04/27/18 0243  04/29/18 1136 04/29/18 1931 04/30/18 0437 04/30/18 1300 05/01/18 0303 05/01/18 0911 05/02/18 0259  WBC 13.8*  --  15.1*  --  15.2*  --   --  15.3* 15.1*  CREATININE 2.09*   < > 1.68* 1.78* 1.29* 1.52* 1.49*  --  1.41*   < > = values in this interval not displayed.    Estimated Creatinine Clearance: 49.8 mL/min (A) (by C-G formula based on SCr of 1.41 mg/dL (H)).    No Known Allergies  Antimicrobials this admission: Doxycycline 1/26 >> 1/30 Vancomycin 1/30 >>  Dose adjustments this admission: none  Microbiology results: 1/23 BCx: NGTD  Thank you for allowing pharmacy to be a part of this patient's care.  Arrie Senate, PharmD, BCPS Clinical Pharmacist [phone] Please check AMION for all [place] numbers 05/02/2018

## 2018-05-02 NOTE — Progress Notes (Signed)
Palliative:  I met again today with Antonio Carrillo and he is sitting on side of bed. He has tremors but is fairly steady on his feet. He continues to feel much better and is extremely happy with his improvement. He continues to diureses and renal function remaining stable. He understands the trajectory of CHF. He tells me that he now understands how ill he was and that he was "close to death." He says that he did not realize how ill he was until the last couple days. 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 He missed the concert that was held 2 days ago (his 2 daughters went in his place).   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   We also discuss changes to his medications and he says that he slept much better last night and is happy with current medication for sleep and anxiety so no changes made today.   Exam: Alert, oriented. No distress. Good spirits.   Plan: - Home with outpatient palliative care via [facility] (liaison working on obtaining approval to follow at home) - Optimize resources to follow at home upon discharge.  - Anxiety: Ativan 0.5 mg po every 8 hours prn. - Insomnia: Ativan 1 mg po qhs.   35 min  Vinie Sill, NP Palliative Medicine Team Pager # [phone] (M-F 8a-5p) Team Phone # [phone] (Nights/Weekends)

## 2018-05-03 ENCOUNTER — Encounter (HOSPITAL_COMMUNITY): Payer: Self-pay | Admitting: Physician Assistant

## 2018-05-03 LAB — COMPREHENSIVE METABOLIC PANEL
ALK PHOS: 132 U/L — AB (ref 38–126)
ALT: 39 U/L (ref 0–44)
AST: 27 U/L (ref 15–41)
Albumin: 3 g/dL — ABNORMAL LOW (ref 3.5–5.0)
Anion gap: 11 (ref 5–15)
BUN: 43 mg/dL — ABNORMAL HIGH (ref 8–23)
CO2: 29 mmol/L (ref 22–32)
CREATININE: 1.31 mg/dL — AB (ref 0.61–1.24)
Calcium: 8.5 mg/dL — ABNORMAL LOW (ref 8.9–10.3)
Chloride: 94 mmol/L — ABNORMAL LOW (ref 98–111)
GFR calc Af Amer: 60 mL/min — ABNORMAL LOW (ref >60)
GFR calc non Af Amer: 51 mL/min — ABNORMAL LOW (ref >60)
Glucose, Bld: 155 mg/dL — ABNORMAL HIGH (ref 70–99)
Potassium: 3.7 mmol/L (ref 3.5–5.1)
Sodium: 134 mmol/L — ABNORMAL LOW (ref 135–145)
Total Bilirubin: 1 mg/dL (ref 0.3–1.2)
Total Protein: 5.2 g/dL — ABNORMAL LOW (ref 6.5–8.1)

## 2018-05-03 LAB — CBC WITH DIFFERENTIAL/PLATELET
ABS IMMATURE GRANULOCYTES: 0.13 10*3/uL — AB (ref 0.00–0.07)
Basophils Absolute: 0 10*3/uL (ref 0.0–0.1)
Basophils Relative: 0 %
Eosinophils Absolute: 0.1 10*3/uL (ref 0.0–0.5)
Eosinophils Relative: 1 %
HCT: 25.7 % — ABNORMAL LOW (ref 39.0–52.0)
Hemoglobin: 7.6 g/dL — ABNORMAL LOW (ref 13.0–17.0)
Immature Granulocytes: 1 %
Lymphocytes Relative: 12 %
Lymphs Abs: 1.8 10*3/uL (ref 0.7–4.0)
MCH: 27 pg (ref 26.0–34.0)
MCHC: 29.6 g/dL — ABNORMAL LOW (ref 30.0–36.0)
MCV: 91.1 fL (ref 80.0–100.0)
Monocytes Absolute: 1.2 10*3/uL — ABNORMAL HIGH (ref 0.1–1.0)
Monocytes Relative: 8 %
Neutro Abs: 11.3 10*3/uL — ABNORMAL HIGH (ref 1.7–7.7)
Neutrophils Relative %: 78 %
Platelets: 160 10*3/uL (ref 150–400)
RBC: 2.82 MIL/uL — ABNORMAL LOW (ref 4.22–5.81)
RDW: 17.1 % — ABNORMAL HIGH (ref 11.5–15.5)
WBC: 14.5 10*3/uL — AB (ref 4.0–10.5)
nRBC: 0 % (ref 0.0–0.2)

## 2018-05-03 LAB — PHOSPHORUS: Phosphorus: 3.4 mg/dL (ref 2.5–4.6)

## 2018-05-03 LAB — MAGNESIUM: Magnesium: 2.5 mg/dL — ABNORMAL HIGH (ref 1.7–2.4)

## 2018-05-03 MED ORDER — LORAZEPAM 1 MG PO TABS
1.0000 mg | ORAL_TABLET | Freq: Three times a day (TID) | ORAL | Status: DC | PRN
  Administered 2018-05-06: 0.5 mg via ORAL
  Filled 2018-05-03: qty 1

## 2018-05-03 MED ORDER — LORAZEPAM 1 MG PO TABS
1.0000 mg | ORAL_TABLET | Freq: Every day | ORAL | Status: DC
  Administered 2018-05-03 – 2018-05-04 (×2): 1 mg via ORAL
  Administered 2018-05-05 – 2018-05-06 (×2): 0.5 mg via ORAL
  Filled 2018-05-03 (×3): qty 1

## 2018-05-03 MED ORDER — BENZOCAINE 10 % MT GEL
Freq: Four times a day (QID) | OROMUCOSAL | Status: DC | PRN
  Administered 2018-05-05 – 2018-05-09 (×2): via OROMUCOSAL
  Administered 2018-05-09: 1 via OROMUCOSAL
  Administered 2018-05-10 – 2018-05-15 (×3): via OROMUCOSAL
  Filled 2018-05-03 (×2): qty 9

## 2018-05-03 MED ORDER — FUROSEMIDE 10 MG/ML IJ SOLN
120.0000 mg | Freq: Three times a day (TID) | INTRAVENOUS | Status: DC
  Administered 2018-05-03 – 2018-05-08 (×14): 120 mg via INTRAVENOUS
  Filled 2018-05-03: qty 12
  Filled 2018-05-03 (×12): qty 10
  Filled 2018-05-03: qty 12
  Filled 2018-05-03 (×2): qty 10
  Filled 2018-05-03: qty 12

## 2018-05-03 NOTE — Progress Notes (Signed)
PROGRESS NOTE    Antonio Carrillo  PFY:[id number] DOB: 1940/04/26 DOA: 04/25/2018 PCP: Patient, No Pcp Per   Brief Narrative:  Antonio Carrillo a 78 y.o.malewith medical history significant ofCLL, hypertension, hyperlipidemia, paroxysmal A. 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 Was started on a Milrinone gtt and getting diuresed with Advanced Heart Team Following. Yesterday he had Left Leg with new warmth and Erythema and ? Venous Stasis changes vs. Cellulitis but Cardiology started the patient on IV Vancomycin.  Assessment & Plan:   Principal Problem:   Acute on chronic combined systolic and diastolic CHF (congestive heart failure) (HCC) Active Problems:   ATRIAL FIBRILLATION   AKI (acute kidney injury) (HCC)   Hyperkalemia   Ascites   CLL (chronic lymphocytic leukemia) (HCC)   Anemia   CHF (congestive heart failure) (HCC)   Palliative care encounter   Right foot injury  Acute on chronic combined systolic and diastolic CHF with hyponatremia, ascites, pedal edema - Appreciate management by cardiology  -palliative care discussions underway per cardiology -Currently getting diuresed with IV Lasix 120 mg BID and on Spironolactone 50 mg po BID  -Cardiology also restarted Metolazone 2.5 mg BID -Further Diuresis per Cardiology Advanced Heart Failure Team -C/w Milrinone gtt and wean per Cardiology recc's -Strict I's/O's and Daily Weights -ECHOCardiogram on 04/25/2018 showed Normal LV systolic function; mild LVE; mild AI; mild MR; severe   biatrial enlargement; mild RVE with moderate RV dysfunction; severe TR; cannot estimate pulmonary pressures due to severity of TR but D shaped septum suggests pulmonary hypertension. -Patient is -12.172 Liters since Admission -Weight is down  from 223 lbs -> 206 lbs -Continue to Monitor Volume Status Carefully  PAF -Cardiology following -Has had Multiple Ablations and MAZE -Patient is on Amiodarone 200 BID and Eliquis -Currently Maintaining NSR but has been in and out of AFib  AKI (acute kidney injury)  -Baseline < 1- on admission ~ 2.5 -Likely cardiorenal- improving with management of heart failure -BUN/Cr is now 43/1.31 -Continue to Monitor and Trend while he is getting diuresed and now that He will be started on Antibiotics with IV Vancomycin  Hypokalemia, improved -K+ is now 3.7 -Continue to Monitor and and Replete as necessary -Repeat CMP ion AM   Right foot wound/Dorsal Laceration- Leukocytosis -Received sut[facility] by Ortho on 1/15 at [facility] along with Keflex - was due to see them on this past Wed but did not make it -1/24   was swollen and weeping profusely likely due to pedal edema - his wound had dehisced and cellulitis was noted.   - after examining wound,  I consulted ortho, Dr Doreatha Martin - he evaluated the patient and removed the sutures- he recommended an antibiotic and we decided on Doxycyline- - he has ordered wet to dry dressing- the foot has subsequently been wrapped with an ACE bandage -Erythema has resolved and swelling is improving with diuresis- would recommend and Doxycycline be stopped today  -WBC remains stable at 14.1 but likely in the setting of CLL  Anxiety/ Depression - Zoloft daily, Ativan PRN -Palliative Care trying Lorazepam 1 mg qHS for Insomnia and improved   Anemia, chronic Iron Deficiency Anemia -Baseline 8-9- -Hb as been ~ 7-8 range   -Anemia panel shows low normal Ferritin and low Iron saturation with normal IBC - cardiology has given  Feraheme  -On Niferex daily as outpt and will continue -Hb/Hct is now 7.6/25.7 -Cardiology would like to hold off on transfusion until Hb is < 7 as he is fluid overloaded  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 -With  splenomegaly -Follows a [facility] -Note from 04/01/18 recommends observation only after his treatment and repeat CT in 3 months -Has a mild Leukocytosis at 14.5  but likely suspect from CLL and not infectious as patient is A Febrile   Dry, painful mouth and throat - viscous Lidocaine PRN (was using magic mouthwash for the Lidocaine component at home)  Gout -C/w Allopurinol 100 mg po Daily   L knee pain/ arthritis -C/w Voltaren gel  LLE Erythema and Warmth; slightly improved -Afebrile and has a Mild Leukocytosis -? Venous stasis from Massive Volume Overload vs. True Cellulitis -Elevate Extremities -WOC Nurse to re-evaluate today and deferred to Dr. Haroldine Laws; Had Rolena Infante placed last saturday  -Have asked nurse to mark erythema borders -Was holding off Abx but Cardiology starting IV Vancomycin -Continue to Monitor  Elevated TSH - Likely sick euthyroid-  Free T4 is normal  Constipation -C/w Miralax daily and Dulcolax PRN  Abnormal LFT's, improved  -In the setting of Advanced Heart Failure -CMP has not been repeated since 04/25/2018 but repeat this AM has showed LFT's have resolved.  -AST is 27 and ALT 39 -Continue to Monitor and repeat CMP in AM   DVT prophylaxis: Anticoagulated with Eliquis Code Status: DO NOT RESUSCITATE Family Communication: Discussed with Daughter at bedside Disposition Plan: Pending further improvement and Improvement back to baseline and Dry Weight   Consultants:   Cardiology  Palliative Care  Orthopedic Surgery   Procedures:   Suture removal 1/24  PICC 1/23   Antimicrobials:  Anti-infectives (From admission, onward)   Start     Dose/Rate Route Frequency Ordered Stop   05/02/18 1400  vancomycin (VANCOCIN) 1,500 mg in sodium chloride 0.9 % 500 mL IVPB     1,500 mg 250 mL/hr over 120 Minutes Intravenous Every 24 hours 05/02/18 1232     04/30/18 2200  doxycycline (VIBRA-TABS) tablet 100 mg     100 mg Oral Every 12 hours 04/30/18 1401  05/01/18 0808   04/26/18 1330  doxycycline (VIBRA-TABS) tablet 100 mg  Status:  Discontinued     100 mg Oral Every 12 hours 04/26/18 1242 04/30/18 1401   04/25/18 1300  terbinafine (LAMISIL) tablet 250 mg     250 mg Oral Daily 04/25/18 1143       Subjective: Seen and Examined he states he did not sleep very well again last night.  No nausea or vomiting.  Concerned about his right foot weeping a little bit more.  His main complaint was mouth soreness and states that the leg "pains in his mouth".  No nausea or vomiting.  Feels better than when he came in but still not back to his baseline.  No other concerns or complaints at this time  Objective: Vitals:   05/02/18 2029 05/03/18 0140 05/03/18 0429 05/03/18 0436  BP: 122/84 108/61  131/69  Pulse: 68 61  76  Resp:  19  (!) 22  Temp: (!) 97.5 F (36.4 C) 98.2 F (36.8 C)  98.1 F (36.7 C)  TempSrc: Oral Oral  Oral  SpO2: 96% 99%  99%  Weight:   93.8 kg   Height:        Intake/Output Summary (Last 24 hours) at 05/03/2018 0804 Last data filed at 05/03/2018  4193 Gross per 24 hour  Intake 1296.43 ml  Output 3195 ml  Net -1898.57 ml   Filed Weights   05/01/18 0140 05/02/18 0440 05/03/18 0429  Weight: 95.3 kg 94.3 kg 93.8 kg   Examination: Physical Exam:  Constitutional: Well-nourished, well-developed overweight Caucasian male currently no acute distress but sitting on the side of the bed and still appears uncomfortable slightly Eyes: Lids and conjunctive are normal.  Sclera anicteric ENMT: External ears and nose appear normal.  Mucous members are moist Neck: Appears supple with no JVD Respiratory: Slightly diminished with mild crackles at the bases but no appreciable wheezing, rales, rhonchi.  Patient has unlabored breathing is not using any accessory muscles to breathe Cardiovascular: Regular rate and rhythm.  Has 2+ lower extremity edema noted Abdomen: Soft, nontender, slightly distended.  Bowel sounds present all 4 quadrants GU:  Deferred Musculoskeletal: No contractures or cyanosis.  No joint warmth noted Skin: Has a right foot dorsal wound infection level is wrapped today.  Left lower extremity erythema is slightly improved but is still very warm and swollen.  Has significant amount of Neurologic: Cranial nerves II through XII grossly intact no appreciable focal deficits Psychiatric: Normal judgment and insight.  He is awake alert and oriented slightly frustrated  Data Reviewed: I have personally reviewed following labs and imaging studies  CBC: Recent Labs  Lab 04/29/18 1136 04/30/18 0437 05/01/18 0911 05/02/18 0259 05/03/18 0441  WBC 15.1* 15.2* 15.3* 15.1* 14.5*  NEUTROABS  --  11.6* 11.8* 11.6* 11.3*  HGB 7.0* 7.2* 7.4* 7.2* 7.6*  HCT 22.8* 23.7* 24.7* 24.6* 25.7*  MCV 89.4 89.8 89.8 91.8 91.1  PLT 195 196 188 179 790   Basic Metabolic Panel: Recent Labs  Lab 04/28/18 0857  04/30/18 0437 04/30/18 1300 05/01/18 0303 05/01/18 0911 05/02/18 0259 05/03/18 0441  NA  --    < > 131* 128* 132*  --  133* 134*  K  --    < > 2.9* 4.1 4.5  --  4.0 3.7  CL  --    < > 96* 88* 93*  --  91* 94*  CO2  --    < > 24 27 26   --  29 29  GLUCOSE  --    < > 136* 198* 162*  --  142* 155*  BUN  --    < > 36* 42* 42*  --  45* 43*  CREATININE  --    < > 1.29* 1.52* 1.49*  --  1.41* 1.31*  CALCIUM  --    < > 7.0* 8.7* 8.9  --  8.5* 8.5*  MG 2.3  --   --   --   --  2.4 2.5* 2.5*  PHOS  --   --   --   --   --  3.0 3.8 3.4   < > = values in this interval not displayed.   GFR: Estimated Creatinine Clearance: 53.5 mL/min (A) (by C-G formula based on SCr of 1.31 mg/dL (H)). Liver Function Tests: Recent Labs  Lab 05/02/18 0259 05/03/18 0441  AST 31 27  ALT 44 39  ALKPHOS 125 132*  BILITOT 0.8 1.0  PROT 5.1* 5.2*  ALBUMIN 2.9* 3.0*   No results for input(s): LIPASE, AMYLASE in the last 168 hours. No results for input(s): AMMONIA in the last 168 hours. Coagulation Profile: No results for input(s): INR, PROTIME  in the last 168 hours. Cardiac Enzymes: No results for input(s): CKTOTAL, CKMB, CKMBINDEX, TROPONINI in the  last 168 hours. BNP (last 3 results) No results for input(s): PROBNP in the last 8760 hours. HbA1C: No results for input(s): HGBA1C in the last 72 hours. CBG: No results for input(s): GLUCAP in the last 168 hours. Lipid Profile: No results for input(s): CHOL, HDL, LDLCALC, TRIG, CHOLHDL, LDLDIRECT in the last 72 hours. Thyroid Function Tests: No results for input(s): TSH, T4TOTAL, FREET4, T3FREE, THYROIDAB in the last 72 hours. Anemia Panel: No results for input(s): VITAMINB12, FOLATE, FERRITIN, TIBC, IRON, RETICCTPCT in the last 72 hours. Sepsis Labs: No results for input(s): PROCALCITON, LATICACIDVEN in the last 168 hours.  Recent Results (from the past 240 hour(s))  Culture, body fluid-bottle     Status: None   Collection Time: 04/25/18  3:28 PM  Result Value Ref Range Status   Specimen Description FLUID PERITONEAL  Final   Special Requests BOTTLES DRAWN AEROBIC AND ANAEROBIC  Final   Culture   Final    NO GROWTH 5 DAYS Performed at [facility], [place] [address], [place], [place] [address]    Report Status 04/30/2018 FINAL  Final  Gram stain     Status: None   Collection Time: 04/25/18  3:28 PM  Result Value Ref Range Status   Specimen Description FLUID PERITONEAL  Final   Special Requests NONE  Final   Gram Stain   Final    WBC PRESENT,BOTH PMN AND MONONUCLEAR NO ORGANISMS SEEN CYTOSPIN SMEAR Performed at [facility], [address], [place], [place] [address]    Report Status 04/25/2018 FINAL  Final   Radiology Studies: No results found.  Scheduled Meds: . allopurinol  100 mg Oral Daily  . amiodarone  200 mg Oral BID  . apixaban  5 mg Oral BID  . diclofenac sodium  2 g Topical QID  . docusate sodium  100 mg Oral Daily  . feeding supplement (ENSURE ENLIVE)  237 mL Oral BID BM  . iron polysaccharides  150 mg Oral Daily  . LORazepam  1 mg Oral  QHS  . metolazone  2.5 mg Oral BID  . multivitamin with minerals  1 tablet Oral Daily  . sertraline  200 mg Oral Daily  . sodium chloride flush  10-40 mL Intracatheter Q12H  . sodium chloride flush  3 mL Intravenous Q12H  . spironolactone  50 mg Oral BID  . terbinafine  250 mg Oral Daily   Continuous Infusions: . sodium chloride    . furosemide 62 mL/hr at 05/02/18 1757  . milrinone 0.5 mcg/kg/min (05/03/18 0702)  . vancomycin Stopped (05/02/18 1502)    LOS: 8 days   Kerney Elbe, DO [facility] PAGER is on [place]  If 7PM-7AM, please contact night-coverage [email or web address] Password [email or web address] 05/03/2018, 8:04 AM

## 2018-05-03 NOTE — Progress Notes (Addendum)
Patient ID: Antonio Carrillo, male   DOB: 1941-04-01, 78 y.o.   MRN: [id number]     Advanced Heart Failure Rounding Note  PCP-Cardiologist: No primary care provider on file.   Subjective:    Admitted from clinic 04/25/2018 with marked volume overloaded and abdominal distention.   Continues to diurese with 120 mg IV lasix twice a day + metolazone twice a day. Weight down another pound.  Remains on milrinone 0.5 mcg. CO-OX 51%- unchanged.  CVP 16  Creatinine 1.5>1.4>1.3 .   Thirsty. Denies SOB.   Echo 04/25/2018: LV EF 55-60%, D-shaped interventricular septum with RV enlargement and dysfunction, severe TR.   Objective:   Weight Range: 93.8 kg Body mass index is 28.84 kg/m.   Vital Signs:   Temp:  [97.5 F (36.4 C)-98.2 F (36.8 C)] 98 F (36.7 C) (01/31 0807) Pulse Rate:  [59-76] 59 (01/31 0807) Resp:  [19-22] 21 (01/31 0807) BP: (104-131)/(50-84) 104/50 (01/31 0807) SpO2:  [94 %-99 %] 94 % (01/31 0807) Weight:  [93.8 kg] 93.8 kg (01/31 0429) Last BM Date: 05/01/18  Weight change: Filed Weights   05/01/18 0140 05/02/18 0440 05/03/18 0429  Weight: 95.3 kg 94.3 kg 93.8 kg   Intake/Output:   Intake/Output Summary (Last 24 hours) at 05/03/2018 1018 Last data filed at 05/03/2018 0900 Gross per 24 hour  Intake 1536.43 ml  Output 3195 ml  Net -1658.57 ml    Physical Exam   CVP 16 checked sitting on the side of the bed.  General:   No resp difficulty HEENT: normal Neck: supple. JVP to ear. Carotids 2+ bilat; no bruits. No lymphadenopathy or thryomegaly appreciated. Cor: PMI nondisplaced. Regular rate & rhythm. No rubs, gallops or murmurs. Lungs: clear Abdomen: soft, nontender, nondistended. No hepatosplenomegaly. No bruits or masses. Good bowel sounds. Extremities: no cyanosis, clubbing, rash, edema. RUE PICC. RLE compression wrap. LLE ted hose.   Neuro: alert & orientedx3, cranial nerves grossly intact. moves all 4 extremities w/o difficulty. Affect pleasant   Telemetry    NSR 70-80s   Personally reviewed   Labs    CBC Recent Labs    05/02/18 0259 05/03/18 0441  WBC 15.1* 14.5*  NEUTROABS 11.6* 11.3*  HGB 7.2* 7.6*  HCT 24.6* 25.7*  MCV 91.8 91.1  PLT 179 253   Basic Metabolic Panel Recent Labs    05/02/18 0259 05/03/18 0441  NA 133* 134*  K 4.0 3.7  CL 91* 94*  CO2 29 29  GLUCOSE 142* 155*  BUN 45* 43*  CREATININE 1.41* 1.31*  CALCIUM 8.5* 8.5*  MG 2.5* 2.5*  PHOS 3.8 3.4   Liver Function Tests Recent Labs    05/02/18 0259 05/03/18 0441  AST 31 27  ALT 44 39  ALKPHOS 125 132*  BILITOT 0.8 1.0  PROT 5.1* 5.2*  ALBUMIN 2.9* 3.0*   No results for input(s): LIPASE, AMYLASE in the last 72 hours. Cardiac Enzymes No results for input(s): CKTOTAL, CKMB, CKMBINDEX, TROPONINI in the last 72 hours.  BNP: BNP (last 3 results) Recent Labs    04/25/18 0949  BNP 1,544.3*    ProBNP (last 3 results) No results for input(s): PROBNP in the last 8760 hours.   D-Dimer No results for input(s): DDIMER in the last 72 hours. Hemoglobin A1C No results for input(s): HGBA1C in the last 72 hours. Fasting Lipid Panel No results for input(s): CHOL, HDL, LDLCALC, TRIG, CHOLHDL, LDLDIRECT in the last 72 hours. Thyroid Function Tests No results for input(s): TSH, T4TOTAL, T3FREE, THYROIDAB  in the last 72 hours.  Invalid input(s): FREET3  Other results:   Imaging    No results found.   Medications:     Scheduled Medications: . allopurinol  100 mg Oral Daily  . amiodarone  200 mg Oral BID  . apixaban  5 mg Oral BID  . diclofenac sodium  2 g Topical QID  . docusate sodium  100 mg Oral Daily  . feeding supplement (ENSURE ENLIVE)  237 mL Oral BID BM  . iron polysaccharides  150 mg Oral Daily  . LORazepam  1 mg Oral QHS  . metolazone  2.5 mg Oral BID  . multivitamin with minerals  1 tablet Oral Daily  . sertraline  200 mg Oral Daily  . sodium chloride flush  10-40 mL Intracatheter Q12H  . sodium chloride flush  3 mL  Intravenous Q12H  . spironolactone  50 mg Oral BID  . terbinafine  250 mg Oral Daily    Infusions: . sodium chloride    . furosemide 120 mg (05/03/18 0819)  . milrinone 0.5 mcg/kg/min (05/03/18 0702)  . vancomycin Stopped (05/02/18 1502)    PRN Medications: sodium chloride, acetaminophen **OR** acetaminophen, benzocaine, bisacodyl, HYDROcodone-acetaminophen, lidocaine, lidocaine, LORazepam, ondansetron **OR** ondansetron (ZOFRAN) IV, prochlorperazine, senna, sodium chloride flush, sodium chloride flush, white petrolatum  Patient Profile   Antonio Carrillo is a 78 y.o. male with h/o AF s/p several ablations, aortic root aneurysm, s/p aortic root replacement with CABG 1, RV failure, CLL, and ascites.   Sent for admission from HF clinic 04/25/2018 with marked volume overload on exam, RLE wound, and marked ascites.   Assessment/Plan   1. 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 [facility] 4/[facility] with normal LV function. RV severely dilated and mild RV dysfunction.  Echo this admission with LV EF 55-60%, RV dilated and dysfunctional, severe TR, D-shaped septum.  - CO-OX stable 51.5% on  milrinone 0.5 mcg/kg/min.  Continue 120 mg IV lasix twice a day + metolazone 2.5 mg twice a day. BMET  Daily.   - Weight down another pound.  - Continue spiro back to 50 mg BID.  - May need repeat paracentesis this admission.  - Long-term prognosis is quite poor.  With RV failure, not candidate for LVAD and also would be poor HD candidate. Palliative Care consult pending  - Per Dr Haroldine Laws ->. For now he is hopeful that he may get some recovery. He was very satisfied with his QOL recently and would like to get back to that. We will continue inotrope support and diuresis.  2. PAF: S/p multiple ablations and MAZE.  - Appears to be in and out of AFib/NSR. Continue to follow closely.  - EKG 04/29/2018 with Afib 60s. - Continue amiodarone 200  mg BID while on milrinone.   - Now off digoxin. Creatinine improving maybe able to restart soon.  - Continue Eliquis. No bleeding issues.  3. CLL: Per Oncology.  Now s/p pelvic XRT. No change.   4. CAD and CABG x 1:  - No s/s of ischemia.    -  No ASA given apixaban use.     5. Aortic root aneurysm s/p repair  6. RLE laceration: Appreciate WOC and Ortho input. No fever or chills.  - Management per Ortho. Appreciate their input.  7. Iron deficiency Anemia: No overt bleeding. Remains on apixaban.   - With marked volume overload, would not transfuse until Hgb <  7.  Hgb stable 7.6   Transferrin saturation low. Hgb pending this am.  - Received feraheme 11/25.  8. Ascites: Persists.  S/p 2 paracenteses recently with 4.1 and 1.3 L out. 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 Cytology from paracentesis has repeatedly been negative for malignancy - Consider repeat paracentesis as needed.  9. Hyponatremia:  - Sodium dropped to 119.. Improved after a couple doses of  tolvaptan.  - Sodium up to 134.   - Free water restrict.  10. Hypokalemia - Potassium stable.   -Continue spiro 50 mg twice a day.  11. Hypotension - Stable.  - Consider midodrine.  12. LLE erythema with possible cellulitis  WBC 14.  Day #2 of Vancomycin.      Darrick Grinder, NP  05/03/2018 10:18 AM   Advanced Heart Failure Team Pager [phone] (M-F; [place])  Please contact [facility] for night-coverage after hours (4p -7a ) and weekends on [email or web address]  Patient seen and examined with the above-signed Advanced Practice Provider and/or Housestaff. I personally reviewed laboratory data, imaging studies and relevant notes. I independently examined the patient and formulated the important aspects of the plan. I have edited the note to reflect any of my changes or salient points. I have personally discussed the plan with the patient and/or family.  HE continues on milrinone. Co-ox remains marginal but renal  function improved. He is diuresing somewhat sluggishly. CVP remains 16-17. Will increase lasix to 120 tid. Continue metoalzone. Electrolytes improved. Continue vancomycin for LLE cellulitis.   Glori Bickers, MD  6:07 PM

## 2018-05-03 NOTE — Care Management Important Message (Signed)
Important Message  Patient Details  Name: Antonio Carrillo MRN: [id number] Date of Birth: May 02, 1940   Medicare Important Message Given:  Yes    Barb Merino Nayelli Inglis 05/03/2018, 2:58 PM

## 2018-05-03 NOTE — Progress Notes (Signed)
Palliative Medicine RN Note: Follow up on insomnia/anxiety. [name] is very anxious with rapid speech. He is trembling and reporting that he isn't sleeping well.  He did not take his HS Ativan until after midnight. He reports that at home he is usually awake until 0300 or so despite Ambien CR. He repeatedly says that he used to take a lot more Ativan than he's on here. Upon further questioning, he reports that he would take a double dose (to total 2 0.5mg  pills) of lorazepam at bedtime to be able to sleep. He also reports that he routinely gets anxious in the early afternoon.  We discussed possibly scheduling Ativan after lunch and taking his ordered bedtime dose. I obtained these orders from Dr Hilma Favors, as well as increasing his PRN dose.  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 In contrast, he also reports that he would just like to "go to sleep."  I explored this statement further. I requested clarification regarding his desire to discuss his goals. He repeats what he has stated with our team before: he doesn't wish to discuss [place] or treatment options. He does say that if he is not going to be able to breathe or eat on his own, he wouldn't want to continue life that way; he would wish to be allowed to die a natural death.  He also makes very clear, though, that he feels he has improved greatly during this admission and DOES NOT want to de-escalate care.  PMT will continue to follow for anxiety/insomnia concerns, as well as to support him while he processes his changes in health and function.  Marjie Skiff Tristin Vandeusen, RN, BSN, [facility] Palliative Medicine Team 05/03/2018 2:38 PM Office [phone]

## 2018-05-03 NOTE — Progress Notes (Signed)
   05/03/18 1600  Clinical Encounter Type  Visited With Patient and family together  Visit Type Initial  Referral From Palliative care team  Consult/Referral To Chaplain  The chaplain responded to PMT request for Pt. spiritual care.  The chaplain was welcomed into the room with two other visitors.  The visitors shifted and allowed the chaplain to sit beside the Pt.  The Pt. shared his goal is to return home to his bed and his toys(pianos).  The Pt. is very happy in [place] with his partner and honors his Jewish faith tradition. The chaplain heard the importance in recognizing the Pt. story and faith tradition in F/U spiritual care.  The chaplain accepted the Pt. invitation to return for a F/U visit.

## 2018-05-04 DIAGNOSIS — Z515 Encounter for palliative care: Secondary | ICD-10-CM

## 2018-05-04 DIAGNOSIS — M545 Low back pain: Secondary | ICD-10-CM

## 2018-05-04 DIAGNOSIS — G8929 Other chronic pain: Secondary | ICD-10-CM

## 2018-05-04 LAB — COOXEMETRY PANEL
Carboxyhemoglobin: 0.6 % (ref 0.5–1.5)
Methemoglobin: 0.6 % (ref 0.0–1.5)
O2 Saturation: 49.4 %
Total hemoglobin: 7.9 g/dL — ABNORMAL LOW (ref 12.0–16.0)

## 2018-05-04 LAB — CBC WITH DIFFERENTIAL/PLATELET
Abs Immature Granulocytes: 0.08 10*3/uL — ABNORMAL HIGH (ref 0.00–0.07)
BASOS PCT: 0 %
Basophils Absolute: 0 10*3/uL (ref 0.0–0.1)
Eosinophils Absolute: 0 10*3/uL (ref 0.0–0.5)
Eosinophils Relative: 0 %
HCT: 25.4 % — ABNORMAL LOW (ref 39.0–52.0)
Hemoglobin: 7.6 g/dL — ABNORMAL LOW (ref 13.0–17.0)
Immature Granulocytes: 1 %
Lymphocytes Relative: 10 %
Lymphs Abs: 1.4 10*3/uL (ref 0.7–4.0)
MCH: 27.3 pg (ref 26.0–34.0)
MCHC: 29.9 g/dL — ABNORMAL LOW (ref 30.0–36.0)
MCV: 91.4 fL (ref 80.0–100.0)
Monocytes Absolute: 1.4 10*3/uL — ABNORMAL HIGH (ref 0.1–1.0)
Monocytes Relative: 10 %
Neutro Abs: 11.3 10*3/uL — ABNORMAL HIGH (ref 1.7–7.7)
Neutrophils Relative %: 79 %
Platelets: 152 10*3/uL (ref 150–400)
RBC: 2.78 MIL/uL — ABNORMAL LOW (ref 4.22–5.81)
RDW: 17 % — ABNORMAL HIGH (ref 11.5–15.5)
WBC: 14.2 10*3/uL — ABNORMAL HIGH (ref 4.0–10.5)
nRBC: 0 % (ref 0.0–0.2)

## 2018-05-04 LAB — COMPREHENSIVE METABOLIC PANEL
ALT: 33 U/L (ref 0–44)
ANION GAP: 13 (ref 5–15)
AST: 28 U/L (ref 15–41)
Albumin: 2.9 g/dL — ABNORMAL LOW (ref 3.5–5.0)
Alkaline Phosphatase: 120 U/L (ref 38–126)
BUN: 41 mg/dL — ABNORMAL HIGH (ref 8–23)
CO2: 30 mmol/L (ref 22–32)
Calcium: 8.4 mg/dL — ABNORMAL LOW (ref 8.9–10.3)
Chloride: 88 mmol/L — ABNORMAL LOW (ref 98–111)
Creatinine, Ser: 1.32 mg/dL — ABNORMAL HIGH (ref 0.61–1.24)
GFR calc Af Amer: 59 mL/min — ABNORMAL LOW (ref >60)
GFR calc non Af Amer: 51 mL/min — ABNORMAL LOW (ref >60)
Glucose, Bld: 192 mg/dL — ABNORMAL HIGH (ref 70–99)
Potassium: 2.9 mmol/L — ABNORMAL LOW (ref 3.5–5.1)
Sodium: 131 mmol/L — ABNORMAL LOW (ref 135–145)
Total Bilirubin: 0.8 mg/dL (ref 0.3–1.2)
Total Protein: 5 g/dL — ABNORMAL LOW (ref 6.5–8.1)

## 2018-05-04 LAB — MAGNESIUM: Magnesium: 2.3 mg/dL (ref 1.7–2.4)

## 2018-05-04 LAB — PHOSPHORUS: Phosphorus: 2.9 mg/dL (ref 2.5–4.6)

## 2018-05-04 MED ORDER — POTASSIUM CHLORIDE CRYS ER 20 MEQ PO TBCR
40.0000 meq | EXTENDED_RELEASE_TABLET | Freq: Once | ORAL | Status: AC
  Administered 2018-05-04: 40 meq via ORAL
  Filled 2018-05-04: qty 2

## 2018-05-04 MED ORDER — POTASSIUM CHLORIDE CRYS ER 20 MEQ PO TBCR
40.0000 meq | EXTENDED_RELEASE_TABLET | Freq: Two times a day (BID) | ORAL | Status: DC
  Administered 2018-05-04: 40 meq via ORAL
  Filled 2018-05-04: qty 2

## 2018-05-04 MED ORDER — HYDROCODONE-ACETAMINOPHEN 5-325 MG PO TABS: 1.0000 | ORAL_TABLET | Freq: Every day | ORAL | Status: DC

## 2018-05-04 MED ORDER — POTASSIUM CHLORIDE CRYS ER 20 MEQ PO TBCR
40.0000 meq | EXTENDED_RELEASE_TABLET | Freq: Three times a day (TID) | ORAL | Status: DC
  Administered 2018-05-04 – 2018-05-05 (×2): 40 meq via ORAL
  Filled 2018-05-04 (×2): qty 2

## 2018-05-04 NOTE — Progress Notes (Addendum)
PROGRESS NOTE    Antonio Carrillo  QVZ:[id number] DOB: [birth year] DOA: 04/25/2018 PCP: Patient, No Pcp Per   Brief Narrative:  Antonio Carrillo a 78 y.o.malewith medical history significant ofCLL, hypertension, hyperlipidemia, paroxysmal A. 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 Was started on a Milrinone gtt and getting diuresed with [facility] Following. During the hospitalization his Left Leg developed  new warmth and Erythema and ? Venous Stasis changes vs. Cellulitis but Cardiology started the patient on IV Vancomycin.  Assessment & Plan:   Principal Problem:   Acute on chronic combined systolic and diastolic CHF (congestive heart failure) (HCC) Active Problems:   ATRIAL FIBRILLATION   AKI (acute kidney injury) (HCC)   Hyperkalemia   Ascites   CLL (chronic lymphocytic leukemia) (HCC)   Anemia   CHF (congestive heart failure) (HCC)   Palliative care encounter   Right foot injury  Acute on chronic combined systolic and diastolic CHF with hyponatremia, ascites, pedal edema - Appreciate management by cardiology  -palliative care discussions underway per cardiology -Currently getting diuresed with IV Lasix 120 mg BID and on Spironolactone 50 mg po BID but Cardiology increased it to 120 mg TID due to slow Diuresis and improvement -Cardiology also restarted Metolazone 2.5 mg BID -Further Diuresis per Cardiology Advanced Heart Failure Team -C/w Milrinone gtt and wean per Cardiology recc's; Co-ox this AM was 49.4 -Strict I's/O's and Daily Weights -ECHOCardiogram on 04/25/2018 showed Normal LV systolic function; mild LVE; mild AI; mild MR; severe   biatrial enlargement; mild RVE with moderate RV dysfunction; severe TR; cannot estimate pulmonary pressures due to  severity of TR but D shaped septum suggests pulmonary hypertension. -Patient is -13.051 Liters since Admission -Weight is down from 223 lbs -> 203 lbs -Repeat CXR in AM -Continue to Monitor Volume Status Carefully  PAF -Cardiology following -Has had Multiple Ablations and MAZE -Patient is on Amiodarone 200 BID and Eliquis -Currently Maintaining NSR but has been in and out of AFib  AKI (acute kidney injury)  -Baseline < 1- on admission ~ 2.5 -Likely cardiorenal- improving with management of heart failure -BUN/Cr is now 41/1.32 -Continue to Monitor and Trend while he is getting diuresed and now that He will be started on Antibiotics with IV Vancomycin  Hypokalemia, improved -K+ this AM was 2.9 -Replete with po Potassium Chloride 40 mEQ BID x2 -Continue to Monitor and and Replete as necessary -Repeat CMP ion AM   Right foot wound/Dorsal Laceration- Leukocytosis -Received sutures by Ortho on 1/15 at [facility] along with Keflex - was due to see them on this past Wed but did not make it -1/24   was swollen and weeping profusely likely due to pedal edema - his wound had dehisced and cellulitis was noted.   -After examining wound,  I consulted ortho, Antonio Carrillo - he evaluated the patient and removed the sutures- he recommended an antibiotic and we decided on Doxycyline-  -He has ordered wet to dry dressing- the foot has subsequently been wrapped with an ACE bandage -Erythema has resolved and swelling is improving with diuresis- would recommend and Doxycycline be stopped today  -WBC remains stable at 14.5 but likely in the setting of CLL  Anxiety/ Depression - Zoloft daily, Ativan PRN -Palliative Care trying Lorazepam 1 mg qHS for Insomnia and improved  Anemia, chronic Iron Deficiency Anemia -Baseline 8-9- -Hb as been ~ 7-8 range   -Anemia panel shows low normal Ferritin and low Iron saturation with normal IBC - cardiology has given Feraheme  -On Niferex daily as outpt and will  continue -Hb/Hct is now 7.6/25.4 -Cardiology would like to hold off on transfusion until Hb is < 7 as he is fluid overloaded  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 -With splenomegaly -Follows a [facility] -Note from 04/01/18 recommends observation only after his treatment and repeat CT in 3 months -Has a mild Leukocytosis at 14.2  but likely suspect from CLL and not infectious as patient is A Febrile   Dry, painful mouth and throat -C/w Viscous Lidocaine PRN (was using magic mouthwash for the Lidocaine component at home) -C/w Benzocaine 10% Mucosal Gel Mouth/Throat 4 times dailyPRN  Gout -C/w Allopurinol 100 mg po Daily   L knee pain/ arthritis -C/w Voltaren gel  LLE Erythema and Warmth; slightly improved -Afebrile and has a Mild Leukocytosis (14.2) -? Venous stasis from Massive Volume Overload vs. True Cellulitis -Elevate Extremities -WOC Nurse to re-evaluate today and deferred to Antonio Carrillo; Had Antonio Carrillo placed last saturday  -Have asked nurse to mark erythema borders -Was holding off Abx but Cardiology starting IV Vancomycin -Continue to Monitor  Elevated TSH - Likely sick euthyroid-  Free T4 is normal  Constipation -C/w Miralax daily and Dulcolax PRN  Abnormal LFT's, improved  -In the setting of Advanced Heart Failure -CMP has not been repeated since 04/25/2018 but repeat this AM has showed LFT's have resolved.  -AST is now 28 and ALT 33 -Continue to Monitor and repeat CMP in AM   DVT prophylaxis: Anticoagulated with Eliquis Code Status: DO NOT RESUSCITATE Family Communication: Discussed with Daughter at bedside Disposition Plan: Pending further improvement and Improvement back to baseline and Dry Weight   Consultants:   Cardiology  Palliative Care  Orthopedic Surgery   Procedures:   Suture removal 1/24  PICC 1/23   Antimicrobials:  Anti-infectives (From admission, onward)   Start     Dose/Rate Route Frequency Ordered  Stop   05/02/18 1400  vancomycin (VANCOCIN) 1,500 mg in sodium chloride 0.9 % 500 mL IVPB     1,500 mg 250 mL/hr over 120 Minutes Intravenous Every 24 hours 05/02/18 1232     04/30/18 2200  doxycycline (VIBRA-TABS) tablet 100 mg     100 mg Oral Every 12 hours 04/30/18 1401 05/01/18 0808   04/26/18 1330  doxycycline (VIBRA-TABS) tablet 100 mg  Status:  Discontinued     100 mg Oral Every 12 hours 04/26/18 1242 04/30/18 1401   04/25/18 1300  terbinafine (LAMISIL) tablet 250 mg     250 mg Oral Daily 04/25/18 1143       Subjective: Seen and Examined at bedside and.  This morning.  States that he is making progress and states that his left leg still swollen and still erythematous slightly.  Right leg seems to be improving per patient.  He is concerned about having his temperature go up slightly from 97.9 to 98.5.  States that his mouth pain is manageable with the benzocaine. No other concerns or complaints at this time and hoping to work with PT this AM.   Objective: Vitals:   05/03/18 2358 05/04/18 0140 05/04/18 0453 05/04/18 0520  BP: 124/66   (!) 104/58  Pulse: 73  (!) 32 (!) 58  Resp:   (!) 24   Temp: 97.9 F (36.6 C)  98.3 F (36.8 C)   TempSrc: Oral  Oral   SpO2: 98%  (!) 82% 95%  Weight: 92.7 kg 92.2 kg    Height:        Intake/Output Summary (Last 24 hours) at 05/04/2018 0753 Last data filed at 05/04/2018 8527 Gross per 24 hour  Intake 1819.43 ml  Output 3070 ml  Net -1250.57 ml   Filed Weights   05/03/18 0429 05/03/18 2358 05/04/18 0140  Weight: 93.8 kg 92.7 kg 92.2 kg   Examination: Physical Exam:  Constitutional: Well-nourished, well-developed overweight Caucasian male currently in no acute stress but he sit on the edge of the bed and appears mildly uncomfortable again today Eyes: Conjunctive are normal.  Sclera anicteric ENMT: External ears nose appear normal.  Mucous members are moist Neck: Appears supple no JVD Respiratory: Diminished auscultation with mild  crackles at the bases but no appreciable wheezing, rales, car.  Patient has unlabored breathing is not using any accessory muscles to breathe Cardiovascular: Regular rate and rhythm but slightly bradycardic.  Has 1-2+ lower extremity edema noted again Abdomen: Soft, nontender, slightly distended.  Bowel sounds present in 4 quadrants GU: Deferred Musculoskeletal: No contractures or cyanosis.  No joint deformities noted; RUE PICC Line in place Skin: Right foot dorsal wound is wrapped again today and did not view.  Approximately erythema is mildly improved but I do still very warm and swollen.  Still has a lot of swelling Neurologic: Cranial nerves II through XII grossly intact no appreciable focal deficits Psychiatric: Judgment and insight.  Slightly depressed appearing.  He is awake alert and oriented x3  Data Reviewed: I have personally reviewed following labs and imaging studies  CBC: Recent Labs  Lab 04/30/18 0437 05/01/18 0911 05/02/18 0259 05/03/18 0441 05/04/18 0500  WBC 15.2* 15.3* 15.1* 14.5* 14.2*  NEUTROABS 11.6* 11.8* 11.6* 11.3* 11.3*  HGB 7.2* 7.4* 7.2* 7.6* 7.6*  HCT 23.7* 24.7* 24.6* 25.7* 25.4*  MCV 89.8 89.8 91.8 91.1 91.4  PLT 196 188 179 160 782   Basic Metabolic Panel: Recent Labs  Lab 04/28/18 0857  04/30/18 1300 05/01/18 0303 05/01/18 0911 05/02/18 0259 05/03/18 0441 05/04/18 0500  NA  --    < > 128* 132*  --  133* 134* 131*  K  --    < > 4.1 4.5  --  4.0 3.7 2.9*  CL  --    < > 88* 93*  --  91* 94* 88*  CO2  --    < > 27 26  --  29 29 30   GLUCOSE  --    < > 198* 162*  --  142* 155* 192*  BUN  --    < > 42* 42*  --  45* 43* 41*  CREATININE  --    < > 1.52* 1.49*  --  1.41* 1.31* 1.32*  CALCIUM  --    < > 8.7* 8.9  --  8.5* 8.5* 8.4*  MG 2.3  --   --   --  2.4 2.5* 2.5* 2.3  PHOS  --   --   --   --  3.0 3.8 3.4 2.9   < > = values in this interval not displayed.   GFR: Estimated Creatinine Clearance: 52.7 mL/min (A) (by C-G formula based on SCr of  1.32 mg/dL (H)). Liver Function Tests: Recent Labs  Lab 05/02/18 0259 05/03/18 0441 05/04/18 0500  AST 31 27 28   ALT 44 39 33  ALKPHOS 125 132* 120  BILITOT 0.8 1.0 0.8  PROT 5.1* 5.2* 5.0*  ALBUMIN 2.9* 3.0* 2.9*   No results for input(s): LIPASE, AMYLASE in the last 168 hours. No results for input(s): AMMONIA in the last 168 hours. Coagulation Profile: No results for input(s): INR, PROTIME in the last 168 hours. Cardiac Enzymes: No results for input(s): CKTOTAL, CKMB, CKMBINDEX, TROPONINI in the last 168 hours. BNP (last 3 results) No results for input(s): PROBNP in the last 8760 hours. HbA1C: No results for input(s): HGBA1C in the last 72 hours. CBG: No results for input(s): GLUCAP in the last 168 hours. Lipid Profile: No results for input(s): CHOL, HDL, LDLCALC, TRIG, CHOLHDL, LDLDIRECT in the last 72 hours. Thyroid Function Tests: No results for input(s): TSH, T4TOTAL, FREET4, T3FREE, THYROIDAB in the last 72 hours. Anemia Panel: No results for input(s): VITAMINB12, FOLATE, FERRITIN, TIBC, IRON, RETICCTPCT in the last 72 hours. Sepsis Labs: No results for input(s): PROCALCITON, LATICACIDVEN in the last 168 hours.  Recent Results (from the past 240 hour(s))  Culture, body fluid-bottle     Status: None   Collection Time: 04/25/18  3:28 PM  Result Value Ref Range Status   Specimen Description FLUID PERITONEAL  Final   Special Requests BOTTLES DRAWN AEROBIC AND ANAEROBIC  Final   Culture   Final    NO GROWTH 5 DAYS Performed at [facility], [facility] [address], [place], [place] [address]    Report Status 04/30/2018 FINAL  Final  Gram stain     Status: None   Collection Time: 04/25/18  3:28 PM  Result Value Ref Range Status   Specimen Description FLUID PERITONEAL  Final   Special Requests NONE  Final   Gram Stain   Final    WBC PRESENT,BOTH PMN AND MONONUCLEAR NO ORGANISMS SEEN CYTOSPIN SMEAR Performed at [facility], [address], [place],  [place] [address]    Report Status 04/25/2018 FINAL  Final   Radiology Studies: No results found.  Scheduled Meds: . allopurinol  100 mg Oral Daily  . amiodarone  200 mg Oral BID  . apixaban  5 mg Oral BID  . diclofenac sodium  2 g Topical QID  . docusate sodium  100 mg Oral Daily  . feeding supplement (ENSURE ENLIVE)  237 mL Oral BID BM  . iron polysaccharides  150 mg Oral Daily  . LORazepam  1 mg Oral QHS  . LORazepam  1 mg Oral Daily  . metolazone  2.5 mg Oral BID  . multivitamin with minerals  1 tablet Oral Daily  . potassium chloride  40 mEq Oral BID  . sertraline  200 mg Oral Daily  . sodium chloride flush  10-40 mL Intracatheter Q12H  . sodium chloride flush  3 mL Intravenous Q12H  . spironolactone  50 mg Oral BID  . terbinafine  250 mg Oral Daily   Continuous Infusions: . sodium chloride Stopped (05/03/18 1325)  . furosemide Stopped (05/03/18 2119)  . milrinone 0.5 mcg/kg/min (05/04/18 0259)  . vancomycin Stopped (05/03/18 1525)    LOS: 9 days   Kerney Elbe, DO [facility] PAGER is on [place]  If 7PM-7AM, please contact night-coverage [email or web address] Password [facility] 05/04/2018, 7:53 AM

## 2018-05-04 NOTE — Progress Notes (Signed)
Pt placed on CPAP for the night at 2230- daughter at bedside.  Pt tolerating well at time of set up.

## 2018-05-04 NOTE — Evaluation (Signed)
Physical Therapy Evaluation Patient Details Name: Antonio Carrillo MRN: [id number] DOB: [birth year] Today's Date: 05/04/2018   History of Present Illness  Antonio Carrillo is a 78 y.o. male with medical history significant of CLL, hypertension, hyperlipidemia, paroxysmal A. 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 Was started on a Milrinone gtt and getting diuresed with [facility] Following. During the hospitalization his Left Leg developed  new warmth and Erythema and ? Venous Stasis changes vs. Cellulitis but Cardiology started the patient on IV Vancomycin.  Clinical Impression  Pt admitted with above diagnosis. Pt currently with functional limitations due to the deficits listed below (see PT Problem List). Pt was able to ambulate with RW with min guard assist.  No assist needed for stability.  Pt slow and cautious.  Should progress well.  Pt will benefit from skilled PT to increase their independence and safety with mobility to allow discharge to the venue listed below.      Follow Up Recommendations Home health PT;Supervision/Assistance - 24 hour    Equipment Recommendations  None recommended by PT    Recommendations for Other Services       Precautions / Restrictions Precautions Precautions: Fall Restrictions Weight Bearing Restrictions: No      Mobility  Bed Mobility Overal bed mobility: Needs Assistance Bed Mobility: Supine to Sit     Supine to sit: Min assist     General bed mobility comments: Pt needed a little assist for trunk elevation.   Transfers Overall transfer level: Needs assistance Equipment used: Rolling walker (2 wheeled) Transfers: Sit to/from Stand Sit to Stand: Min guard         General transfer comment: No physical assist.  Incr time  to come to stand  Ambulation/Gait Ambulation/Gait assistance: Min guard Gait Distance (Feet): 125 Feet Assistive device: Rolling walker (2 wheeled) Gait Pattern/deviations: Step-through pattern;Decreased stride length   Gait velocity interpretation: <1.31 ft/sec, indicative of household ambulator General Gait Details: Overall, pt very safe with RW.  Pt at times took standing rest breaks but was able to progress ambulation without assist.  VSS.  DOE 1/4.    Stairs            Wheelchair Mobility    Modified Rankin (Stroke Patients Only)       Balance Overall balance assessment: Needs assistance Sitting-balance support: No upper extremity supported;Feet supported Sitting balance-Leahy Scale: Fair     Standing balance support: Bilateral upper extremity supported;During functional activity Standing balance-Leahy Scale: Poor Standing balance comment: relies on UE support for balance.                              Pertinent Vitals/Pain Pain Assessment: No/denies pain    Home Living Family/patient expects to be discharged to:: Private residence Living Arrangements: Spouse/significant other Available Help at Discharge: Family;Available PRN/intermittently(wife works [employer], neighbors can help anytime) Type of Home: House Home Access: Stairs to enter Entrance Stairs-Rails: Left Entrance Stairs-Number of Steps: 3 Home Layout: One level Home Equipment: Shower seat - built in;Walker - 4 wheels;Transport chair      Prior Function Level of Independence: Needs assistance   Gait / Transfers Assistance Needed: Modif I with rollator  ADL's / Homemaking Assistance Needed: B/D self  Comments:  Pt still teaches piano     Hand Dominance   Dominant Hand: Right    Extremity/Trunk Assessment   Upper Extremity Assessment Upper Extremity Assessment: Defer to OT evaluation    Lower Extremity Assessment Lower Extremity Assessment: RLE deficits/detail RLE Deficits /  Details: unna boot in place RLE: Unable to fully assess due to immobilization    Cervical / Trunk Assessment Cervical / Trunk Assessment: Normal  Communication   Communication: No difficulties  Cognition Arousal/Alertness: Awake/alert Behavior During Therapy: WFL for tasks assessed/performed Overall Cognitive Status: Within Functional Limits for tasks assessed                                        General Comments General comments (skin integrity, edema, etc.): Bil LEs edematous.  Propped on pillows on return to room.    Exercises General Exercises - Lower Extremity Ankle Circles/Pumps: AROM;Both;10 reps;Supine Long Arc Quad: AROM;Both;10 reps;Seated Hip Flexion/Marching: AROM;Both;10 reps;Seated   Assessment/Plan    PT Assessment Patient needs continued PT services  PT Problem List Decreased mobility;Decreased balance;Decreased activity tolerance;Decreased knowledge of use of DME;Decreased safety awareness;Decreased knowledge of precautions       PT Treatment Interventions DME instruction;Gait training;Functional mobility training;Therapeutic activities;Therapeutic exercise;Balance training;Patient/family education;Stair training    PT Goals (Current goals can be found in the Care Plan section)  Acute Rehab PT Goals Patient Stated Goal: to go home PT Goal Formulation: With patient Time For Goal Achievement: 05/18/18 Potential to Achieve Goals: Good    Frequency Min 3X/week   Barriers to discharge        Co-evaluation               AM-PAC PT "6 Clicks" Mobility  Outcome Measure Help needed turning from your back to your side while in a flat bed without using bedrails?: A Little Help needed moving from lying on your back to sitting on the side of a flat bed without using bedrails?: A Little Help needed moving to and from a bed to a chair (including a wheelchair)?: A Little Help needed standing up from a chair using your arms (e.g., wheelchair  or bedside chair)?: A Little Help needed to walk in hospital room?: A Little Help needed climbing 3-5 steps with a railing? : A Little 6 Click Score: 18    End of Session Equipment Utilized During Treatment: Gait belt Activity Tolerance: Patient limited by fatigue Patient left: with call bell/phone within reach;in bed;with family/visitor present Nurse Communication: Mobility status PT Visit Diagnosis: Unsteadiness on feet (R26.81);Muscle weakness (generalized) (M62.81)    Time: 0272-5366 PT Time Calculation (min) (ACUTE ONLY): 31 min   Charges:   PT Evaluation $PT Eval Moderate Complexity: 1 Mod PT Treatments $Gait Training: 8-22 mins        [place] Pager:  [phone]  Office:  [place] 05/04/2018, 12:55 PM

## 2018-05-04 NOTE — Progress Notes (Signed)
Patient ID: Antonio Carrillo, male   DOB: 1940/09/28, 78 y.o.   MRN: [id number]     Advanced Heart Failure Rounding Note  PCP-Cardiologist: No primary care provider on file.   Subjective:    Admitted from clinic 04/25/2018 with marked volume overloaded and abdominal distention.   Remains on milrinone 0.5. LAsix increase to 120 IV TID yesterday. Also on metolazone. Weight down another pound overnight. (now 20 pounds total). Co-ox 49%. Creatinine stable at 1.3. CVP remains 17. On vancomycin for LLE cellulitis   Echo 04/25/2018: LV EF 55-60%, D-shaped interventricular septum with RV enlargement and dysfunction, severe TR.   Objective:   Weight Range: 92.2 kg Body mass index is 28.34 kg/m.   Vital Signs:   Temp:  [97.9 F (36.6 C)-98.5 F (36.9 C)] 98.5 F (36.9 C) (02/01 0800) Pulse Rate:  [32-73] 58 (02/01 0520) Resp:  [16-32] 17 (02/01 0800) BP: (102-124)/(43-66) 102/43 (02/01 0800) SpO2:  [82 %-98 %] 95 % (02/01 0520) Weight:  [92.2 kg-92.7 kg] 92.2 kg (02/01 0140) Last BM Date: 05/01/18  Weight change: Filed Weights   05/03/18 0429 05/03/18 2358 05/04/18 0140  Weight: 93.8 kg 92.7 kg 92.2 kg   Intake/Output:   Intake/Output Summary (Last 24 hours) at 05/04/2018 1217 Last data filed at 05/04/2018 0820 Gross per 24 hour  Intake 1459.43 ml  Output 2520 ml  Net -1060.57 ml    Physical Exam   General:  Lying in bed. Weak appearing  No resp difficulty HEENT: normal Neck: supple. JVP to ear  Carotids 2+ bilat; no bruits. No lymphadenopathy or thryomegaly appreciated. Cor: PMI nondisplaced. Regular rate & rhythm. 2/6 TR  Lungs: clear Abdomen: soft, nontender, + distended. No hepatosplenomegaly. No bruits or masses. Good bowel sounds. Extremities: no cyanosis, clubbing, rash, 3+ edema LLE erythema improving  Neuro: alert & orientedx3, cranial nerves grossly intact. moves all 4 extremities w/o difficulty. Affect pleasant   Telemetry   NSR 60-70s   Personally  reviewed   Labs    CBC Recent Labs    05/03/18 0441 05/04/18 0500  WBC 14.5* 14.2*  NEUTROABS 11.3* 11.3*  HGB 7.6* 7.6*  HCT 25.7* 25.4*  MCV 91.1 91.4  PLT 160 629   Basic Metabolic Panel Recent Labs    05/03/18 0441 05/04/18 0500  NA 134* 131*  K 3.7 2.9*  CL 94* 88*  CO2 29 30  GLUCOSE 155* 192*  BUN 43* 41*  CREATININE 1.31* 1.32*  CALCIUM 8.5* 8.4*  MG 2.5* 2.3  PHOS 3.4 2.9   Liver Function Tests Recent Labs    05/03/18 0441 05/04/18 0500  AST 27 28  ALT 39 33  ALKPHOS 132* 120  BILITOT 1.0 0.8  PROT 5.2* 5.0*  ALBUMIN 3.0* 2.9*   No results for input(s): LIPASE, AMYLASE in the last 72 hours. Cardiac Enzymes No results for input(s): CKTOTAL, CKMB, CKMBINDEX, TROPONINI in the last 72 hours.  BNP: BNP (last 3 results) Recent Labs    04/25/18 0949  BNP 1,544.3*    ProBNP (last 3 results) No results for input(s): PROBNP in the last 8760 hours.   D-Dimer No results for input(s): DDIMER in the last 72 hours. Hemoglobin A1C No results for input(s): HGBA1C in the last 72 hours. Fasting Lipid Panel No results for input(s): CHOL, HDL, LDLCALC, TRIG, CHOLHDL, LDLDIRECT in the last 72 hours. Thyroid Function Tests No results for input(s): TSH, T4TOTAL, T3FREE, THYROIDAB in the last 72 hours.  Invalid input(s): FREET3  Other results:  Imaging    No results found.   Medications:     Scheduled Medications: . allopurinol  100 mg Oral Daily  . amiodarone  200 mg Oral BID  . apixaban  5 mg Oral BID  . diclofenac sodium  2 g Topical QID  . docusate sodium  100 mg Oral Daily  . feeding supplement (ENSURE ENLIVE)  237 mL Oral BID BM  . [START ON 05/05/2018] HYDROcodone-acetaminophen  1 tablet Oral QHS  . iron polysaccharides  150 mg Oral Daily  . LORazepam  1 mg Oral QHS  . LORazepam  1 mg Oral Daily  . metolazone  2.5 mg Oral BID  . multivitamin with minerals  1 tablet Oral Daily  . potassium chloride  40 mEq Oral BID  . sertraline   200 mg Oral Daily  . sodium chloride flush  10-40 mL Intracatheter Q12H  . sodium chloride flush  3 mL Intravenous Q12H  . spironolactone  50 mg Oral BID  . terbinafine  250 mg Oral Daily    Infusions: . sodium chloride Stopped (05/03/18 1325)  . furosemide 120 mg (05/04/18 0821)  . milrinone 0.5 mcg/kg/min (05/04/18 0720)  . vancomycin Stopped (05/03/18 1525)    PRN Medications: sodium chloride, acetaminophen **OR** acetaminophen, benzocaine, bisacodyl, HYDROcodone-acetaminophen, lidocaine, lidocaine, LORazepam, ondansetron **OR** ondansetron (ZOFRAN) IV, prochlorperazine, senna, sodium chloride flush, sodium chloride flush, white petrolatum  Patient Profile   Antonio Carrillo is a 78 y.o. male with h/o AF s/p several ablations, aortic root aneurysm, s/p aortic root replacement with CABG 1, RV failure, CLL, and ascites.   Sent for admission from HF clinic 04/25/2018 with marked volume overload on exam, RLE wound, and marked ascites.   Assessment/Plan   1. 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 cMRI 4/18 at [facility] with normal LV function. RV severely dilated and mild RV dysfunction.  Echo this admission with LV EF 55-60%, RV dilated and dysfunctional, severe TR, D-shaped septum.  - CO-OX 49% despite milrinone 0.5 mcg/kg/min. - Modest diuresis. Weight down another pounds (20 pounds total). Still with another 20 pounds to go.  - Continue 120 mg IV lasix tid + metolazone 2.5 mg twice a day. Can switch back to lasix gtt at 20/hr once hypokalemia improved - Continue spiro 50 mg BID to help limit hypokalemia - Long-term prognosis is quite poor.  With RV failure, not candidate for LVAD and also would be poor HD candidate. Palliative Care following. He has been resistant to [place] discussions. He is hopeful that he may get some recovery. He was very satisfied with his QOL recently and would like to get back to that. We will  continue inotrope support and diuresis.  2. PAF: S/p multiple ablations and MAZE.  - In NSR - Continue amiodarone 200 mg BID while on milrinone.   - Now off digoxin. Creatinine improving maybe able to restart soon.  - Continue Eliquis. No bleeding issues.  3. CLL: Per Oncology.  Now s/p pelvic XRT. No change.   4. CAD and CABG x 1:  - No s/s of ischemia.    -  No ASA given apixaban use.     5. Aortic root aneurysm s/p repair  6. RLE laceration: Appreciate WOC and Ortho input. No fever or chills.  - Management per Ortho. Appreciate their input.  7. Iron deficiency Anemia: No overt bleeding. Remains on apixaban.   - With marked volume overload, would not  transfuse until Hgb < 7.  Hgb stable 7.6   Transferrin saturation low. Hgb pending this am.  - Received feraheme 11/25.  8. Ascites: Persists.  S/p 2 paracenteses recently with 4.1 and 1.3 L out. 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 Cytology from paracentesis has repeatedly been negative for malignancy - Consider repeat paracentesis as needed.  9. Hyponatremia:  - Sodium dropped to 119.. Improved after a couple doses of  tolvaptan.  - Sodium stable at 131.   - Free water restrict.  10. Hypokalemia - Potassium back down. Will supp aggressively -Continue spiro 50 mg twice a day.  11. Hypotension - Stable.  - Consider midodrine.  12. LLE cellulitis  - Improving.  Day #3 of Vancomycin.     Glori Bickers, MD  05/04/2018 12:17 PM   Advanced Heart Failure Team Pager [phone] (M-F; 7a - 4p)  Please contact [facility] for night-coverage after hours (4p -7a ) and weekends on [email or web address]

## 2018-05-04 NOTE — Progress Notes (Signed)
  Asked to see patient for follow-up regarding insomnia and anxiety.  Tells me that he has  had insomnia for most of his life.  He is currently on Lasix which is also contributing to nighttime awakenings.  His daughter is at the bedside.  She states "my dad has sleep apnea".  She states he never got a CPAP machine nor pursued further work-up once his initial diagnosis was given years ago.  He tells me the medication changes made yesterday to manage his anxiety in terms of scheduling Ativan at 2:00 in the afternoon and at bedtime was helpful but states pain is waking him back up at about 1 or 2:00 in the morning  He continues to not want to address further goals of care.  States he is already discussed all he wants to discuss.  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  He is currently on milrinone and unfortunately hospices in our area do not take patients on milrinone  Plan We will add 1 Vicodin at 1:00 in the morning and continue scheduled Ativan as per orders received on 05/03/2018 Would patient benefit from a CPAP machine with the goal to go home with equipment? Palliative medicine to continue to support patient holistically  to discuss goals of care when patient is ready.  Thank you, Romona Curls, NP Time spent: 20 minutes Greater than 50% of time was spent in counseling and coordination of care

## 2018-05-05 ENCOUNTER — Inpatient Hospital Stay (HOSPITAL_COMMUNITY): Payer: Medicare Other

## 2018-05-05 LAB — COMPREHENSIVE METABOLIC PANEL
ALT: 29 U/L (ref 0–44)
AST: 27 U/L (ref 15–41)
Albumin: 2.9 g/dL — ABNORMAL LOW (ref 3.5–5.0)
Alkaline Phosphatase: 126 U/L (ref 38–126)
Anion gap: 13 (ref 5–15)
BUN: 43 mg/dL — ABNORMAL HIGH (ref 8–23)
CO2: 29 mmol/L (ref 22–32)
Calcium: 8.7 mg/dL — ABNORMAL LOW (ref 8.9–10.3)
Chloride: 89 mmol/L — ABNORMAL LOW (ref 98–111)
Creatinine, Ser: 1.31 mg/dL — ABNORMAL HIGH (ref 0.61–1.24)
GFR calc Af Amer: 60 mL/min — ABNORMAL LOW (ref >60)
GFR calc non Af Amer: 51 mL/min — ABNORMAL LOW (ref >60)
Glucose, Bld: 173 mg/dL — ABNORMAL HIGH (ref 70–99)
Potassium: 3.9 mmol/L (ref 3.5–5.1)
Sodium: 131 mmol/L — ABNORMAL LOW (ref 135–145)
Total Bilirubin: 0.8 mg/dL (ref 0.3–1.2)
Total Protein: 5.3 g/dL — ABNORMAL LOW (ref 6.5–8.1)

## 2018-05-05 LAB — CBC WITH DIFFERENTIAL/PLATELET
ABS IMMATURE GRANULOCYTES: 0.12 10*3/uL — AB (ref 0.00–0.07)
Basophils Absolute: 0 10*3/uL (ref 0.0–0.1)
Basophils Relative: 0 %
Eosinophils Absolute: 0 10*3/uL (ref 0.0–0.5)
Eosinophils Relative: 0 %
HCT: 26.8 % — ABNORMAL LOW (ref 39.0–52.0)
Hemoglobin: 8 g/dL — ABNORMAL LOW (ref 13.0–17.0)
Immature Granulocytes: 1 %
Lymphocytes Relative: 9 %
Lymphs Abs: 1.7 10*3/uL (ref 0.7–4.0)
MCH: 26.8 pg (ref 26.0–34.0)
MCHC: 29.9 g/dL — ABNORMAL LOW (ref 30.0–36.0)
MCV: 89.9 fL (ref 80.0–100.0)
MONO ABS: 1.5 10*3/uL — AB (ref 0.1–1.0)
Monocytes Relative: 8 %
Neutro Abs: 14.9 10*3/uL — ABNORMAL HIGH (ref 1.7–7.7)
Neutrophils Relative %: 82 %
Platelets: 139 10*3/uL — ABNORMAL LOW (ref 150–400)
RBC: 2.98 MIL/uL — ABNORMAL LOW (ref 4.22–5.81)
RDW: 17 % — ABNORMAL HIGH (ref 11.5–15.5)
WBC: 18.2 10*3/uL — ABNORMAL HIGH (ref 4.0–10.5)
nRBC: 0 % (ref 0.0–0.2)

## 2018-05-05 LAB — COOXEMETRY PANEL
Carboxyhemoglobin: 0.6 % (ref 0.5–1.5)
Methemoglobin: 1.2 % (ref 0.0–1.5)
O2 Saturation: 61.2 %
Total hemoglobin: 8.4 g/dL — ABNORMAL LOW (ref 12.0–16.0)

## 2018-05-05 LAB — MAGNESIUM: Magnesium: 2.5 mg/dL — ABNORMAL HIGH (ref 1.7–2.4)

## 2018-05-05 LAB — VANCOMYCIN, TROUGH: Vancomycin Tr: 14 ug/mL — ABNORMAL LOW (ref 15–20)

## 2018-05-05 LAB — PHOSPHORUS: PHOSPHORUS: 3.2 mg/dL (ref 2.5–4.6)

## 2018-05-05 MED ORDER — MAGIC MOUTHWASH W/LIDOCAINE
5.0000 mL | Freq: Four times a day (QID) | ORAL | Status: DC | PRN
  Administered 2018-05-05 – 2018-05-07 (×7): 5 mL via ORAL
  Filled 2018-05-05 (×9): qty 5

## 2018-05-05 MED ORDER — POTASSIUM CHLORIDE CRYS ER 20 MEQ PO TBCR
60.0000 meq | EXTENDED_RELEASE_TABLET | Freq: Three times a day (TID) | ORAL | Status: DC
  Administered 2018-05-05 – 2018-05-06 (×5): 60 meq via ORAL
  Filled 2018-05-05 (×5): qty 3

## 2018-05-05 NOTE — Progress Notes (Signed)
Per verbal order from Dr. Haroldine Laws, q shift monitoring of CVPs discontinued and will be assessed daily by MD only.

## 2018-05-05 NOTE — Progress Notes (Signed)
Pharmacy Antibiotic Note  Antonio Carrillo is a 78 y.o. male admitted on 04/25/2018 with cellulitis.  Pharmacy has been consulted for vancomycin dosing. Of note, pt is s/p 5-day course of doxycycline.  Currently on day #4 of vancomycin. WBC up at 18.2, afebrile. Vancomycin trough drawn appropriately 24 hours after last dose came back therapeutic at 14. Scr remains stable at 1.3 (CrCl ~53 mL/min).   Plan: Continue Vancomycin 1500mg  IV q24h Monitor LOT, renal function, cultures Vancomycin levels as needed  Height: 5\' 11"  (180.3 cm) Weight: 200 lb 9.9 oz (91 kg) IBW/kg (Calculated) : 75.3  Temp (24hrs), Avg:98.2 F (36.8 C), Min:97.8 F (36.6 C), Max:98.7 F (37.1 C)  Recent Labs  Lab 05/01/18 0303 05/01/18 0911 05/02/18 0259 05/03/18 0441 05/04/18 0500 05/05/18 0408 05/05/18 1558  WBC  --  15.3* 15.1* 14.5* 14.2* 18.2*  --   CREATININE 1.49*  --  1.41* 1.31* 1.32* 1.31*  --   VANCOTROUGH  --   --   --   --   --   --  14*    Estimated Creatinine Clearance: 52.8 mL/min (A) (by C-G formula based on SCr of 1.31 mg/dL (H)).    No Known Allergies  Antimicrobials this admission: Doxycycline 1/26 >> 1/30 Vancomycin 1/30 >>  Dose adjustments this admission: 2/2 VT: 14 - no changes  Microbiology results: 1/23 BCx: NGTD 1/23 Pleural fluid: no orgs  Thank you for allowing pharmacy to be a part of this patient's care.  Antonietta Jewel, PharmD, [place] Clinical Pharmacist  Pager: [phone] Phone: [phone] Please check AMION for all [facility] numbers 05/05/2018

## 2018-05-05 NOTE — Progress Notes (Signed)
Patient ID: Antonio Carrillo, male   DOB: 1940-12-17, 78 y.o.   MRN: [id number]     Advanced Heart Failure Rounding Note  PCP-Cardiologist: No primary care provider on file.   Subjective:    Admitted from clinic 04/25/2018 with marked volume overloaded and abdominal distention.   Remains on milrinone 0.5. Lasix 120 IV tid, spiro 50 bid and metolazone 2.5 bid. On vancomycin for LLE cellulitis.   Good diuresis overnight. Weight down 3 pounds. K improved to 3.9. Co-ox up to 61%  Feels better. No CP, SOB, orthopnea or PND.     Echo 04/25/2018: LV EF 55-60%, D-shaped interventricular septum with RV enlargement and dysfunction, severe TR.   Objective:   Weight Range: 91 kg Body mass index is 27.98 kg/m.   Vital Signs:   Temp:  [97.6 F (36.4 C)-98.7 F (37.1 C)] 97.8 F (36.6 C) (02/02 0436) Pulse Rate:  [60-64] 60 (02/02 0436) Resp:  [17-22] 20 (02/02 0436) BP: (107-115)/(52-63) 107/52 (02/02 0436) SpO2:  [96 %-97 %] 97 % (02/02 0436) Weight:  [91 kg] 91 kg (02/02 0411) Last BM Date: 05/03/18  Weight change: Filed Weights   05/03/18 2358 05/04/18 0140 05/05/18 0411  Weight: 92.7 kg 92.2 kg 91 kg   Intake/Output:   Intake/Output Summary (Last 24 hours) at 05/05/2018 0947 Last data filed at 05/05/2018 4098 Gross per 24 hour  Intake 1410.48 ml  Output 3090 ml  Net -1679.52 ml    Physical Exam   General:  Weak appearing No resp difficulty HEENT: normal Neck: supple. JVP to ear  Carotids 2+ bilat; no bruits. No lymphadenopathy or thryomegaly appreciated. Cor: PMI nondisplaced. Regular rate & rhythm. 2/6 TR + RV lift  Lungs: clear Abdomen: soft, nontender, + distended. No hepatosplenomegaly. No bruits or masses. Good bowel sounds. Extremities: no cyanosis, clubbing, rash, 2-3+ edema LLE cellulitis improving  Neuro: alert & orientedx3, cranial nerves grossly intact. moves all 4 extremities w/o difficulty. Affect pleasant   Telemetry   NSR 60s   Personally  reviewed   Labs    CBC Recent Labs    05/04/18 0500 05/05/18 0408  WBC 14.2* 18.2*  NEUTROABS 11.3* 14.9*  HGB 7.6* 8.0*  HCT 25.4* 26.8*  MCV 91.4 89.9  PLT 152 119*   Basic Metabolic Panel Recent Labs    05/04/18 0500 05/05/18 0408  NA 131* 131*  K 2.9* 3.9  CL 88* 89*  CO2 30 29  GLUCOSE 192* 173*  BUN 41* 43*  CREATININE 1.32* 1.31*  CALCIUM 8.4* 8.7*  MG 2.3 2.5*  PHOS 2.9 3.2   Liver Function Tests Recent Labs    05/04/18 0500 05/05/18 0408  AST 28 27  ALT 33 29  ALKPHOS 120 126  BILITOT 0.8 0.8  PROT 5.0* 5.3*  ALBUMIN 2.9* 2.9*   No results for input(s): LIPASE, AMYLASE in the last 72 hours. Cardiac Enzymes No results for input(s): CKTOTAL, CKMB, CKMBINDEX, TROPONINI in the last 72 hours.  BNP: BNP (last 3 results) Recent Labs    04/25/18 0949  BNP 1,544.3*    ProBNP (last 3 results) No results for input(s): PROBNP in the last 8760 hours.   D-Dimer No results for input(s): DDIMER in the last 72 hours. Hemoglobin A1C No results for input(s): HGBA1C in the last 72 hours. Fasting Lipid Panel No results for input(s): CHOL, HDL, LDLCALC, TRIG, CHOLHDL, LDLDIRECT in the last 72 hours. Thyroid Function Tests No results for input(s): TSH, T4TOTAL, T3FREE, THYROIDAB in the last 72 hours.  Invalid input(s): FREET3  Other results:   Imaging    Dg Chest Port 1 View  Result Date: 05/05/2018 CLINICAL DATA:  Bibasilar crackles EXAM: PORTABLE CHEST 1 VIEW COMPARISON:  04/25/2018 FINDINGS: Stable right PICC. Stable atrial appendage stable. Moderate cardiomegaly. Normal vascularity. Subsegmental atelectasis at the lung bases. No pneumothorax or pleural effusion. IMPRESSION: Cardiomegaly without decompensation. Electronically Signed   By: Marybelle Killings M.D.   On: 05/05/2018 07:57     Medications:     Scheduled Medications: . allopurinol  100 mg Oral Daily  . amiodarone  200 mg Oral BID  . apixaban  5 mg Oral BID  . diclofenac sodium  2 g  Topical QID  . docusate sodium  100 mg Oral Daily  . feeding supplement (ENSURE ENLIVE)  237 mL Oral BID BM  . HYDROcodone-acetaminophen  1 tablet Oral QHS  . iron polysaccharides  150 mg Oral Daily  . LORazepam  1 mg Oral QHS  . LORazepam  1 mg Oral Daily  . metolazone  2.5 mg Oral BID  . multivitamin with minerals  1 tablet Oral Daily  . potassium chloride  60 mEq Oral TID  . sertraline  200 mg Oral Daily  . sodium chloride flush  10-40 mL Intracatheter Q12H  . sodium chloride flush  3 mL Intravenous Q12H  . spironolactone  50 mg Oral BID  . terbinafine  250 mg Oral Daily    Infusions: . sodium chloride Stopped (05/03/18 1325)  . furosemide 120 mg (05/04/18 2026)  . milrinone 0.5 mcg/kg/min (05/04/18 2211)  . vancomycin 1,500 mg (05/04/18 1625)    PRN Medications: sodium chloride, acetaminophen **OR** acetaminophen, benzocaine, bisacodyl, HYDROcodone-acetaminophen, lidocaine, lidocaine, LORazepam, ondansetron **OR** ondansetron (ZOFRAN) IV, prochlorperazine, senna, sodium chloride flush, sodium chloride flush, white petrolatum  Patient Profile   Antonio Carrillo is a 78 y.o. male with h/o AF s/p several ablations, aortic root aneurysm, s/p aortic root replacement with CABG 1, RV failure, CLL, and ascites.   Sent for admission from HF clinic 04/25/2018 with marked volume overload on exam, RLE wound, and marked ascites.   Assessment/Plan   1. 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 cMRI 4/18 at [facility] with normal LV function. RV severely dilated and mild RV dysfunction.  Echo this admission with LV EF 55-60%, RV dilated and dysfunctional, severe TR, D-shaped septum.  - Co-ox back up to 61% toda. Continue milrinone 0.5 mcg/kg/min. No wean until fully diuresed - Weight down another 3 pounds (23 pounds total). Still with another 15-20 pounds to go.  - Continue 120 mg IV lasix tid + metolazone 2.5 mg twice a day. Can  switch back to lasix gtt at 20/hr as needed - Continue spiro 50 mg BID to help limit hypokalemia - Long-term prognosis is quite poor.  With RV failure, not candidate for LVAD and also would be poor HD candidate. Palliative Care following. He has been resistant to [place] discussions. He is hopeful that he may get some recovery. He was very satisfied with his QOL recently and would like to get back to that. We will continue inotrope support and diuresis.  2. PAF: S/p multiple ablations and MAZE.  - Remains in NSR - Continue amiodarone 200 mg BID while on milrinone.   - Now off digoxin. Creatinine improving maybe able to restart soon.  - Continue Eliquis. No bleeding issues. Hgb stable at 8.0 3. CLL: Per Oncology.  Now s/p  pelvic XRT. No change.   4. CAD and CABG x 1:  - No s/s of ischemia.    -  No ASA given apixaban use.     5. Aortic root aneurysm s/p repair  6. RLE laceration: Appreciate WOC and Ortho input. No fever or chills.  - Management per Ortho. Appreciate their input.  7. Iron deficiency Anemia: No overt bleeding. Remains on apixaban.   - With marked volume overload, would not transfuse until Hgb < 7.  Hgb stable at 8.0  Transferrin saturation low. Hgb pending this am.  - Received feraheme 11/25.  8. Ascites: Persists.  S/p 2 paracenteses recently with 4.1 and 1.3 L out. 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 Cytology from paracentesis has repeatedly been negative for malignancy - Consider repeat paracentesis as needed.  9. Hyponatremia:  - Sodium dropped to 119.. Improved after a couple doses of  tolvaptan.  - Sodium stable at 131 today   - Free water restrict.  10. Hypokalemia - Potassium improving but still a bit low. Continue spiro 50 bid. Increase kcl to 60 tid -Continue spiro 50 mg twice a day.  11. Hypotension - Stable.  - Consider midodrine as needed 12. LLE cellulitis  - Improving.  Day #4 of Vancomycin.     Glori Bickers, MD  05/05/2018 9:47 AM    Advanced Heart Failure Team Pager [phone] (M-F; 7a - 4p)  Please contact [facility] for night-coverage after hours (4p -7a ) and weekends on [email or web address]

## 2018-05-05 NOTE — Progress Notes (Signed)
PROGRESS NOTE    Antonio Carrillo  NTZ:[id number] DOB: 1940/10/02 DOA: 04/25/2018 PCP: Patient, No Pcp Per   Brief Narrative:  Antonio Carrillo a 78 y.o.malewith medical history significant ofCLL, hypertension, hyperlipidemia, paroxysmal A. 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 Was started on a Milrinone gtt and getting diuresed with Advanced Heart Team Following. During the hospitalization his Left Leg developed  new warmth and Erythema and ? Venous Stasis changes vs. Cellulitis but Cardiology started the patient on IV Vancomycin.  Assessment & Plan:   Principal Problem:   Acute on chronic combined systolic and diastolic CHF (congestive heart failure) (HCC) Active Problems:   ATRIAL FIBRILLATION   AKI (acute kidney injury) (HCC)   Hyperkalemia   Ascites   CLL (chronic lymphocytic leukemia) (HCC)   Anemia   CHF (congestive heart failure) (HCC)   Palliative care encounter   Right foot injury   Chronic bilateral low back pain without sciatica   Palliative care by specialist  Acute on chronic combined systolic and diastolic CHF with hyponatremia, ascites, pedal edema - Appreciate management by cardiology  -palliative care discussions underway per cardiology -Currently getting diuresed with IV Lasix 120 mg TID and on Spironolactone 50 mg po BID; Per Cards can switch back to Lasix gtt at 20/hr when K+ is improved but this was before it was realized that he lost 1 lb in an hour and not in a day; Will defer to Cardiology for whether to initiate Lasix gtt and we will continue the IV Lasix 120 mg TID at this time.  -Cardiology also restarted Metolazone 2.5 mg BID -Further Diuresis per Cardiology Advanced Heart Failure Team -C/w Milrinone gtt and wean per Cardiology recc's; Co-ox  this AM was 61.2 -Strict I's/O's and Daily Weights -ECHOCardiogram on 04/25/2018 showed Normal LV systolic function; mild LVE; mild AI; mild MR; severe   biatrial enlargement; mild RVE with moderate RV dysfunction; severe TR; cannot estimate pulmonary pressures due to severity of TR but D shaped septum suggests pulmonary hypertension. -Patient is -14.611 Liters since Admission -Weight is down from 223 lbs -> 200 lbs -Repeat CXR this AM showed "Stable right PICC. Stable atrial appendage stable. Moderate cardiomegaly. Normal vascularity. Subsegmental atelectasis at the lung bases. No pneumothorax or pleural effusion." -Continue to Monitor Volume Status Carefully  PAF -Cardiology following -Has had Multiple Ablations and MAZE -Patient is on Amiodarone 200 BID and Eliquis -Currently Maintaining NSR but has been in and out of AFib  AKI (acute kidney injury)  -Baseline < 1- on admission ~ 2.5 -Likely cardiorenal- improving with management of heart failure -BUN/Cr is now 43/1.31 -Continue to Monitor and Trend while he is getting diuresed and now that He will be started on Antibiotics with IV Vancomycin  Hypokalemia, improved -K+ this AM was 3.9; Mag Level is 2.5 -Replete with po Potassium Chloride 40 mEQ BID x2 yesterday -Continue to Monitor and and Replete as necessary -Repeat CMP ion AM   Right foot wound/Dorsal Laceration- Leukocytosis -Received sutures by Ortho on 1/15 at [facility] along with Keflex - was due to see them on this past Wed but did not make it -1/24   was swollen and weeping profusely likely due to pedal edema - his wound had dehisced and cellulitis was noted.   -After examining wound, Dr. Dr Doreatha Martin was  consulted - he evaluated the patient and removed the sutures- he recommended an antibiotic and it was decided on Doxycyline which has now stopped -He has ordered wet to dry dressing- the foot has subsequently been wrapped with an ACE bandage -Erythema has resolved and swelling  is improving with diuresis- would recommend and Doxycycline now stopped but patient developed LLE Erythema so Cardiology started the patient on IV Vancomycin on 05/02/2018 -WBC elevated from yesterday and went from 14.2 -> 18.2 but likely in the setting of CLL; Patient is Afebrile   Anxiety/ Depression - Zoloft daily, Ativan PRN -Palliative Care trying Lorazepam 1 mg qHS for Insomnia and improved   Anemia, chronic Iron Deficiency Anemia -Baseline 8-9- -Hb as been ~ 7-8 range   -Anemia panel shows low normal Ferritin and low Iron saturation with normal IBC - cardiology has given Feraheme  -On Niferex daily as outpt and will continue -Hb/Hct is now 8.0/26.8 -Cardiology would like to hold off on transfusion until Hb is < 7 as he is fluid overloaded  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 -With splenomegaly -Follows a [facility] -Note from 04/01/18 recommends observation only after his treatment and repeat CT in 3 months -Has a mild Leukocytosis at 14.2 that worsened to 18.2  but likely suspect from CLL and not infectious as patient is A Febrile   Dry, painful mouth and throat -C/w Viscous Lidocaine PRN (was using magic mouthwash for the Lidocaine component at home) -C/w Benzocaine 10% Mucosal Gel Mouth/Throat 4 times dailyPRN  OSA -Started CPAP qHS -Tolerated some but then removed due to waking up frequently to Urinate  Gout -C/w Allopurinol 100 mg po Daily   L knee pain/ Arthritis -C/w Voltaren gel  LLE Erythema and Warmth; slightly improved -Afebrile and has a Mild Leukocytosis which slightly worsened (14.2 -> 18.2 in the setting of CLL) -? Venous stasis from Massive Volume Overload vs. True Cellulitis -Elevate Extremities -WOC Nurse to re-evaluate today and deferred to Dr. Haroldine Laws; Had Rolena Infante placed last saturday  -Have asked nurse to mark erythema borders -Was holding off Abx but Cardiology starting IV Vancomycin -Continue to Monitor  Elevated  TSH - Likely Sick Euthyroid-  Free T4 is normal  Constipation -C/w Miralax daily and Dulcolax PRN  Abnormal LFT's, improved  -In the setting of Advanced Heart Failure -CMP has not been repeated since 04/25/2018 but repeat this AM has showed LFT's have resolved.  -AST is now 27 and ALT 29 -Continue to Monitor and repeat CMP in AM   DVT prophylaxis: Anticoagulated with Eliquis Code Status: DO NOT RESUSCITATE Family Communication: Discussed with Daughter at bedside Disposition Plan: Pending further improvement and Improvement back to baseline and Dry Weight   Consultants:   Cardiology  Palliative Care  Orthopedic Surgery   Procedures:   Suture removal 1/24  PICC 1/23   Antimicrobials:  Anti-infectives (From admission, onward)   Start     Dose/Rate Route Frequency Ordered Stop   05/02/18 1400  vancomycin (VANCOCIN) 1,500 mg in sodium chloride 0.9 % 500 mL IVPB     1,500 mg 250 mL/hr over 120 Minutes Intravenous Every 24 hours 05/02/18 1232     04/30/18 2200  doxycycline (VIBRA-TABS) tablet 100 mg     100 mg Oral Every 12 hours 04/30/18 1401 05/01/18 0808   04/26/18 1330  doxycycline (VIBRA-TABS) tablet 100 mg  Status:  Discontinued     100 mg Oral Every 12 hours 04/26/18 1242 04/30/18 1401   04/25/18 1300  terbinafine (LAMISIL) tablet 250 mg     250 mg Oral Daily 04/25/18 1143       Subjective: Seen and Examined at bedside and seemed to be doing okay.  States he had a okay night and tolerated the CPAP initially but then had to remove it due to frequent urination.  No nausea or vomiting.  Thinks his legs are getting better.  Happy with the progress that he is back and is down another 3 pounds.  Not as frustrated today.  No chest pain, lightheadedness or dizziness.  No other concerns or complaints at this time and thinks that his legs are doing better as well.  Objective: Vitals:   05/04/18 2045 05/05/18 0101 05/05/18 0411 05/05/18 0436  BP: (!) 113/52 (!) 109/53  (!)  107/52  Pulse: 64 60  60  Resp: 20 17  20   Temp: 98.7 F (37.1 C) 98.1 F (36.7 C)  97.8 F (36.6 C)  TempSrc: Oral Oral  Oral  SpO2: 97% 96%  97%  Weight:   91 kg   Height:        Intake/Output Summary (Last 24 hours) at 05/05/2018 4287 Last data filed at 05/05/2018 6811 Gross per 24 hour  Intake 1530.48 ml  Output 3090 ml  Net -1559.52 ml   Filed Weights   05/03/18 2358 05/04/18 0140 05/05/18 0411  Weight: 92.7 kg 92.2 kg 91 kg   Examination: Physical Exam:  Constitutional: Well-nourished, well-developed overweight Caucasian male currently no acute distress laying in bed and appears more comfortable today than he did yesterday Eyes: Conjunctive and lids are normal.  Sclera anicteric ENMT: External ears and nose appear normal.  Mucous members are moist Neck: Appears supple with some JVD Respiratory: Diminished auscultation bilaterally with improved breath sounds and not as much and crackles.  No appreciable wheezing, rales, rhonchi.  Patient has unlabored breathing is not using any accessory muscles to breathe  Cardiovascular: Regular rate and rhythm but has a heart rate in the slower side.  Has 2-3+ lower extremity edema noted again on the left and the right has improved significantly. Abdomen: Soft, nontender, slightly distended.  Bowel sounds present all 4 quadrants GU: Deferred Musculoskeletal: Has a right upper extremity PICC line in place.  No appreciable contractures or cyanosis.  No joint deformities noted Skin: Right dorsal foot wound is unwrapped today and I viewed it and appears a little bit better than previous and erythema has improved.  Still very swollen.  Left leg erythema is improving as well on has essentially almost resolved but he does have a lot of warmth or swelling still. Neurologic: Cranial nerves II through XII grossly intact no appreciable focal deficits. Psychiatric: Normal judgment and insight.  Appears more cheerful today and not as depressed.  He is  awake and alert and oriented x3  Data Reviewed: I have personally reviewed following labs and imaging studies  CBC: Recent Labs  Lab 05/01/18 0911 05/02/18 0259 05/03/18 0441 05/04/18 0500 05/05/18 0408  WBC 15.3* 15.1* 14.5* 14.2* 18.2*  NEUTROABS 11.8* 11.6* 11.3* 11.3* 14.9*  HGB 7.4* 7.2* 7.6* 7.6* 8.0*  HCT 24.7* 24.6* 25.7* 25.4* 26.8*  MCV 89.8 91.8 91.1 91.4 89.9  PLT 188 179 160 152 572*   Basic Metabolic Panel: Recent Labs  Lab 05/01/18 0303 05/01/18 0911 05/02/18 0259 05/03/18 0441 05/04/18 0500 05/05/18 0408  NA 132*  --  133* 134* 131* 131*  K 4.5  --  4.0 3.7 2.9* 3.9  CL 93*  --  91* 94* 88* 89*  CO2 26  --  29 29 30 29   GLUCOSE 162*  --  142* 155* 192* 173*  BUN 42*  --  45* 43* 41* 43*  CREATININE 1.49*  --  1.41* 1.31* 1.32* 1.31*  CALCIUM 8.9  --  8.5* 8.5* 8.4* 8.7*  MG  --  2.4 2.5* 2.5* 2.3 2.5*  PHOS  --  3.0 3.8 3.4 2.9 3.2   GFR: Estimated Creatinine Clearance: 52.8 mL/min (A) (by C-G formula based on SCr of 1.31 mg/dL (H)). Liver Function Tests: Recent Labs  Lab 05/02/18 0259 05/03/18 0441 05/04/18 0500 05/05/18 0408  AST 31 27 28 27   ALT 44 39 33 29  ALKPHOS 125 132* 120 126  BILITOT 0.8 1.0 0.8 0.8  PROT 5.1* 5.2* 5.0* 5.3*  ALBUMIN 2.9* 3.0* 2.9* 2.9*   No results for input(s): LIPASE, AMYLASE in the last 168 hours. No results for input(s): AMMONIA in the last 168 hours. Coagulation Profile: No results for input(s): INR, PROTIME in the last 168 hours. Cardiac Enzymes: No results for input(s): CKTOTAL, CKMB, CKMBINDEX, TROPONINI in the last 168 hours. BNP (last 3 results) No results for input(s): PROBNP in the last 8760 hours. HbA1C: No results for input(s): HGBA1C in the last 72 hours. CBG: No results for input(s): GLUCAP in the last 168 hours. Lipid Profile: No results for input(s): CHOL, HDL, LDLCALC, TRIG, CHOLHDL, LDLDIRECT in the last 72 hours. Thyroid Function Tests: No results for input(s): TSH, T4TOTAL, FREET4,  T3FREE, THYROIDAB in the last 72 hours. Anemia Panel: No results for input(s): VITAMINB12, FOLATE, FERRITIN, TIBC, IRON, RETICCTPCT in the last 72 hours. Sepsis Labs: No results for input(s): PROCALCITON, LATICACIDVEN in the last 168 hours.  Recent Results (from the past 240 hour(s))  Culture, body fluid-bottle     Status: None   Collection Time: 04/25/18  3:28 PM  Result Value Ref Range Status   Specimen Description FLUID PERITONEAL  Final   Special Requests BOTTLES DRAWN AEROBIC AND ANAEROBIC  Final   Culture   Final    NO GROWTH 5 DAYS Performed at [facility], [place] [address] [place], [place] [address]    Report Status 04/30/2018 FINAL  Final  Gram stain     Status: None   Collection Time: 04/25/18  3:28 PM  Result Value Ref Range Status   Specimen Description FLUID PERITONEAL  Final   Special Requests NONE  Final   Gram Stain   Final    WBC PRESENT,BOTH PMN AND MONONUCLEAR NO ORGANISMS SEEN CYTOSPIN SMEAR Performed at [facility], [address] [place], [place] [address]    Report Status 04/25/2018 FINAL  Final   Radiology Studies: Dg Chest Port 1 View  Result Date: 05/05/2018 CLINICAL DATA:  Bibasilar crackles EXAM: PORTABLE CHEST 1 VIEW COMPARISON:  04/25/2018 FINDINGS: Stable right PICC. Stable atrial appendage stable. Moderate cardiomegaly. Normal vascularity. Subsegmental atelectasis at the lung bases. No pneumothorax or pleural effusion. IMPRESSION: Cardiomegaly without decompensation. Electronically Signed   By: Marybelle Killings M.D.   On: 05/05/2018 07:57    Scheduled Meds: . allopurinol  100 mg Oral Daily  . amiodarone  200 mg Oral BID  . apixaban  5 mg Oral BID  . diclofenac sodium  2 g Topical QID  . docusate sodium  100 mg Oral Daily  . feeding supplement (ENSURE ENLIVE)  237 mL Oral BID BM  . HYDROcodone-acetaminophen  1 tablet Oral QHS  . iron polysaccharides  150 mg Oral Daily  . LORazepam  1 mg Oral QHS  . LORazepam  1 mg Oral Daily  .  metolazone  2.5 mg Oral BID  . multivitamin with minerals  1 tablet Oral Daily  . potassium chloride  40 mEq Oral TID  . sertraline  200 mg Oral Daily  . sodium chloride flush  10-40 mL Intracatheter Q12H  . sodium chloride flush  3 mL Intravenous Q12H  . spironolactone  50 mg Oral BID  . terbinafine  250 mg Oral Daily   Continuous Infusions: . sodium chloride Stopped (05/03/18 1325)  . furosemide 120 mg (05/04/18 2026)  . milrinone 0.5 mcg/kg/min (05/04/18 2211)  . vancomycin 1,500 mg (05/04/18 1625)    LOS: 10 days   Kerney Elbe, DO [facility] PAGER is on [place]  If 7PM-7AM, please contact night-coverage [email or web address] Password [email or web address] 05/05/2018, 8:06 AM

## 2018-05-06 DIAGNOSIS — F411 Generalized anxiety disorder: Secondary | ICD-10-CM

## 2018-05-06 LAB — CBC WITH DIFFERENTIAL/PLATELET
Abs Immature Granulocytes: 0.08 10*3/uL — ABNORMAL HIGH (ref 0.00–0.07)
Basophils Absolute: 0 10*3/uL (ref 0.0–0.1)
Basophils Relative: 0 %
Eosinophils Absolute: 0.1 10*3/uL (ref 0.0–0.5)
Eosinophils Relative: 1 %
HCT: 26.5 % — ABNORMAL LOW (ref 39.0–52.0)
Hemoglobin: 7.8 g/dL — ABNORMAL LOW (ref 13.0–17.0)
Immature Granulocytes: 1 %
Lymphocytes Relative: 8 %
Lymphs Abs: 1.2 10*3/uL (ref 0.7–4.0)
MCH: 27.1 pg (ref 26.0–34.0)
MCHC: 29.4 g/dL — ABNORMAL LOW (ref 30.0–36.0)
MCV: 92 fL (ref 80.0–100.0)
MONO ABS: 1.2 10*3/uL — AB (ref 0.1–1.0)
Monocytes Relative: 8 %
Neutro Abs: 12.1 10*3/uL — ABNORMAL HIGH (ref 1.7–7.7)
Neutrophils Relative %: 82 %
Platelets: 145 10*3/uL — ABNORMAL LOW (ref 150–400)
RBC: 2.88 MIL/uL — ABNORMAL LOW (ref 4.22–5.81)
RDW: 17 % — AB (ref 11.5–15.5)
WBC: 14.7 10*3/uL — ABNORMAL HIGH (ref 4.0–10.5)
nRBC: 0 % (ref 0.0–0.2)

## 2018-05-06 LAB — BASIC METABOLIC PANEL
Anion gap: 11 (ref 5–15)
BUN: 53 mg/dL — ABNORMAL HIGH (ref 8–23)
CHLORIDE: 92 mmol/L — AB (ref 98–111)
CO2: 30 mmol/L (ref 22–32)
Calcium: 8.5 mg/dL — ABNORMAL LOW (ref 8.9–10.3)
Creatinine, Ser: 1.26 mg/dL — ABNORMAL HIGH (ref 0.61–1.24)
GFR calc Af Amer: >60 mL/min (ref >60)
GFR calc non Af Amer: 54 mL/min — ABNORMAL LOW (ref >60)
Glucose, Bld: 213 mg/dL — ABNORMAL HIGH (ref 70–99)
Potassium: 3.9 mmol/L (ref 3.5–5.1)
SODIUM: 133 mmol/L — AB (ref 135–145)

## 2018-05-06 LAB — COOXEMETRY PANEL
Carboxyhemoglobin: 2 % — ABNORMAL HIGH (ref 0.5–1.5)
Methemoglobin: 1.1 % (ref 0.0–1.5)
O2 Saturation: 59.9 %
Total hemoglobin: 8.2 g/dL — ABNORMAL LOW (ref 12.0–16.0)

## 2018-05-06 LAB — PHOSPHORUS: Phosphorus: 2.5 mg/dL (ref 2.5–4.6)

## 2018-05-06 LAB — MAGNESIUM: Magnesium: 2.3 mg/dL (ref 1.7–2.4)

## 2018-05-06 MED ORDER — LORAZEPAM 0.5 MG PO TABS
0.5000 mg | ORAL_TABLET | Freq: Four times a day (QID) | ORAL | Status: DC | PRN
  Administered 2018-05-07 – 2018-05-08 (×4): 0.5 mg via ORAL
  Filled 2018-05-06 (×4): qty 1

## 2018-05-06 MED ORDER — HYDROCODONE-ACETAMINOPHEN 5-325 MG PO TABS
1.0000 | ORAL_TABLET | Freq: Four times a day (QID) | ORAL | Status: DC | PRN
  Administered 2018-05-06 – 2018-05-08 (×5): 1 via ORAL
  Filled 2018-05-06 (×6): qty 1

## 2018-05-06 NOTE — Progress Notes (Signed)
Pt offered CPAP during 11pm rounds.  Pt declined at that time.  Encouraged family to call when pt wanted to place CPAP.  Will cont to offer pt use of CPAP.

## 2018-05-06 NOTE — Progress Notes (Signed)
Daily Progress Note   Patient Name: Antonio Carrillo       Date: 05/06/2018 DOB: [birth year]  Age: 78 y.o. MRN#: [id number] Attending Physician: Kerney Elbe, DO Primary Care Physician: Patient, No Pcp Per Admit Date: 04/25/2018  Reason for Consultation/Follow-up: Establishing goals of care, Non pain symptom management and Pain control  Subjective:  Patient awake, alert, oriented. Received page from RN requesting pain management follow-up. Vital signs stable.  Patient complains of generalized pain that is relieved by prn Vicodin. He declined scheduled dose in the early AM because he didn't feel he needed at that time. We discussed his ativan usage. He has been requesting lower doses of ativan and feels lower doses more frequently will better manage his anxiety and insomnia. He had a bowel movement this morning.   Discussed medication adjustments with patient and son at bedside. Answered all questions. PMT contact information given.   Length of Stay: 11  Current Medications: Scheduled Meds:  . allopurinol  100 mg Oral Daily  . amiodarone  200 mg Oral BID  . apixaban  5 mg Oral BID  . diclofenac sodium  2 g Topical QID  . docusate sodium  100 mg Oral Daily  . feeding supplement (ENSURE ENLIVE)  237 mL Oral BID BM  . iron polysaccharides  150 mg Oral Daily  . LORazepam  1 mg Oral QHS  . LORazepam  1 mg Oral Daily  . metolazone  2.5 mg Oral BID  . multivitamin with minerals  1 tablet Oral Daily  . potassium chloride  60 mEq Oral TID  . sertraline  200 mg Oral Daily  . sodium chloride flush  10-40 mL Intracatheter Q12H  . sodium chloride flush  3 mL Intravenous Q12H  . spironolactone  50 mg Oral BID  . terbinafine  250 mg Oral Daily    Continuous Infusions: . sodium chloride  Stopped (05/03/18 1325)  . furosemide 120 mg (05/06/18 0901)  . milrinone 0.5 mcg/kg/min (05/06/18 0935)  . vancomycin 1,500 mg (05/05/18 1640)    PRN Meds: sodium chloride, acetaminophen **OR** acetaminophen, benzocaine, bisacodyl, HYDROcodone-acetaminophen, lidocaine, LORazepam, magic mouthwash w/lidocaine, ondansetron **OR** ondansetron (ZOFRAN) IV, prochlorperazine, senna, sodium chloride flush, sodium chloride flush, white petrolatum  Physical Exam Vitals signs and nursing note reviewed.  Constitutional:      General:  He is awake.  HENT:     Head: Normocephalic and atraumatic.  Cardiovascular:     Rate and Rhythm: Rhythm irregularly irregular.  Pulmonary:     Effort: No tachypnea, accessory muscle usage or respiratory distress.  Abdominal:     Tenderness: There is no abdominal tenderness.  Skin:    General: Skin is warm and dry.     Coloration: Skin is pale.  Neurological:     Mental Status: He is alert and oriented to person, place, and time.  Psychiatric:        Mood and Affect: Mood normal.        Speech: Speech normal.        Behavior: Behavior is cooperative.        Cognition and Memory: Cognition normal.            Vital Signs: BP 131/61 (BP Location: Left Arm)   Pulse 65   Temp 97.6 F (36.4 C) (Oral)   Resp 19   Ht 5\' 11"  (1.803 m)   Wt 91 kg   SpO2 100%   BMI 27.98 kg/m  SpO2: SpO2: 100 % O2 Device: O2 Device: Room Air O2 Flow Rate: O2 Flow Rate (L/min): 2 L/min  Intake/output summary:   Intake/Output Summary (Last 24 hours) at 05/06/2018 1017 Last data filed at 05/06/2018 0935 Gross per 24 hour  Intake 1124.18 ml  Output 3950 ml  Net -2825.82 ml   LBM: Last BM Date: 05/06/18 Baseline Weight: Weight: 101.3 kg Most recent weight: Weight: 91 kg       Palliative Assessment/Data: PPS 50%   Flowsheet Rows     Most Recent Value  Intake Tab  Referral Department  Cardiology  Unit at Time of Referral  Cardiac/Telemetry Unit  Palliative Care  Primary Diagnosis  Cardiac  Date Notified  04/26/18  Palliative Care Type  New Palliative care  Reason for referral  Clarify Goals of Care  Date of Admission  04/25/18  Date first seen by Palliative Care  04/30/18  # of days Palliative referral response time  4 Day(s)  # of days IP prior to Palliative referral  1  Clinical Assessment  Psychosocial & Spiritual Assessment  Palliative Care Outcomes      Patient Active Problem List   Diagnosis Date Noted  . Chronic bilateral low back pain without sciatica   . Palliative care by specialist   . Right foot injury 04/26/2018  . Palliative care encounter   . Acute on chronic combined systolic and diastolic CHF (congestive heart failure) ([place]) 04/25/2018  . AKI (acute kidney injury) ([place]) 04/25/2018  . Hyperkalemia 04/25/2018  . Ascites 04/25/2018  . CLL (chronic lymphocytic leukemia) ([place]) 04/25/2018  . Anemia 04/25/2018  . CHF (congestive heart failure) ([place]) 04/25/2018  . 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 Coronary artery disease 07/28/2015  . Pericardial effusion 01/19/2015  . Cerebrovascular disease 01/19/2015  . Anticoagulants causing adverse effect in therapeutic use   . Hyperthyroidism   . Testicular failure   . Aortic aneurysm ([place])   . HTN (hypertension)   . Hyperlipidemia   . 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 PAD (peripheral artery disease) ([place]) 10/31/2010  . Long term (current) use of anticoagulants 06/22/2010  . SHORTNESS OF BREATH 12/28/2008  . CELLULITIS, MILD 12/11/2008  . HYPERLIPIDEMIA-MIXED 11/16/2008  . Aneurysm of thoracic aorta ([place]) 11/16/2008  . HYPERTHYROIDISM 12/05/2006  . ATRIAL FIBRILLATION 12/05/2006    Palliative Care Assessment & Plan   Patient Profile:  78 y.o. male  with past medical history of CLL, HTN, HLD, RV failure, atrial fibrillation (on Eliquis), s/p multiple ablations, s/p CABG, severe right ventricular failure, recurrent ascites admitted on 04/25/2018 with leg swelling and  weight gain 40 lbs x 1 month. Now inotrope dependent and continues with aggressive diuresis although renal function has improved.   Assessment: Acute on chronic combined CHF Hyponatremia Ascites Pedal edema PAF AKI Right foot wound/cellulitis Anxiety Depression Insomnia CLL  Recommendations/Plan:  Discontinued 0100 AM Vicodin dose. Ordered Vicodin 5-325mg  1 tab q6h prn moderate pain.   Continue scheduled Ativan 1mg  BID. (Patient may request to take 0.5mg ). Will decrease prn ativan dose to 0.5mg  but allow patient to request every 6 hours as needed for anxiety/sleep.   Continue current plan of care per attending and HF team.   PMT will continue to follow.  Code Status: DNR   Code Status Orders  (From admission, onward)         Start     Ordered   05/01/18 1044  Do not attempt resuscitation (DNR)  Continuous    Question Answer Comment  In the event of cardiac or respiratory ARREST Do not call a "code blue"   In the event of cardiac or respiratory ARREST Do not perform Intubation, CPR, defibrillation or ACLS   In the event of cardiac or respiratory ARREST Use medication by any route, position, wound care, and other measures to relive pain and suffering. May use oxygen, suction and manual treatment of airway obstruction as needed for comfort.      05/01/18 1043        Code Status History    Date Active Date Inactive Code Status Order ID Comments User Context   04/25/2018 1143 05/01/2018 1043 Full Code [id number]  Barb Merino, MD Inpatient       Prognosis:   Poor long-term prognosis  Discharge Planning:  To Be Determined  Care plan was discussed with patient, daughter, son, RN  Thank you for allowing the Palliative Medicine Team to assist in the care of this patient.   Time In: 0940 Time Out: 1010 Total Time 30 Prolonged Time Billed no      Greater than 50%  of this time was spent counseling and coordinating care related to the above assessment and  plan.  Ihor Dow, FNP-C Palliative Medicine Team  Phone: [phone] Fax: [phone]  Please contact Palliative Medicine Team phone at [phone] for questions and concerns.

## 2018-05-06 NOTE — Progress Notes (Signed)
PROGRESS NOTE    Antonio Carrillo  ZOX:[id number] DOB: [birth year] DOA: 04/25/2018 PCP: Patient, No Pcp Per   Brief Narrative:  Antonio Carrillo a 78 y.o.malewith medical history significant ofCLL, hypertension, hyperlipidemia, paroxysmal A. 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 Was started on a Milrinone gtt and getting diuresed with [facility] Following. During the hospitalization his Left Leg developed  new warmth and Erythema and ? Venous Stasis changes vs. Cellulitis but Cardiology started the patient on IV Vancomycin.  Slowly improving with diuresis. Still complaining of a lot of Mouth Pain.   Assessment & Plan:   Principal Problem:   Acute on chronic combined systolic and diastolic CHF (congestive heart failure) (HCC) Active Problems:   ATRIAL FIBRILLATION   AKI (acute kidney injury) (HCC)   Hyperkalemia   Ascites   CLL (chronic lymphocytic leukemia) (HCC)   Anemia   CHF (congestive heart failure) (HCC)   Palliative care encounter   Right foot injury   Chronic bilateral low back pain without sciatica   Palliative care by specialist  Acute on chronic combined systolic and diastolic CHF with hyponatremia, ascites, pedal edema - Appreciate management by cardiology  -palliative care discussions underway per cardiology -Currently getting diuresed with IV Lasix 120 mg TID and on Spironolactone 50 mg po BID; Per Cards can switch back to Lasix gtt at 20/hr when K+ is improved but this was before it was realized that he lost 1 lb in an hour and not in a day; Will defer to Cardiology for whether to initiate Lasix gtt and we will continue the IV Lasix 120 mg TID at this time.  -Cardiology also restarted Metolazone 2.5 mg BID -C/w Potassium Replacement with 60 mEQ of  po KCl TID -Further Diuresis per Cardiology Advanced Heart Failure Team -C/w Milrinone gtt and wean per Cardiology recc's; Co-ox this AM was 59.9; Per Cardiology will not wean Milrinone until further diuresed  -Strict I's/O's and Daily Weights -ECHOCardiogram on 04/25/2018 showed Normal LV systolic function; mild LVE; mild AI; mild MR; severe   biatrial enlargement; mild RVE with moderate RV dysfunction; severe TR; cannot estimate pulmonary pressures due to severity of TR but D shaped septum suggests pulmonary hypertension. -Patient is -17.137 Liters since Admission -Weight is down from 223 lbs -> 198 lbs (Weight Not charted this AM but was 198.2) -Repeat CXR yesterday AM (05/05/2018) showed "Stable right PICC. Stable atrial appendage stable. Moderate cardiomegaly. Normal vascularity. Subsegmental atelectasis at the lung bases. No pneumothorax or pleural effusion." -Continue to Monitor Volume Status Carefully  PAF -Cardiology following -Has had Multiple Ablations and MAZE -Patient is on Amiodarone 200 BID and Eliquis -Currently Maintaining NSR but has been in and out of AFib  AKI (acute kidney injury)  -Baseline < 1- on admission ~ 2.5 -Likely cardiorenal- improving with management of heart failure -BUN/Cr is now improved to 53/1.26 -Continue to Monitor and Trend while he is getting diuresed and now that He will be started on Antibiotics with IV Vancomycin  Hypokalemia, improved -K+ this AM was 3.9; Mag Level is 2.3 -Continue to Monitor and and Replete as necessary; Currently getting 60 mgEQ po TID -Repeat CMP ion AM   Right foot wound/Dorsal Laceration- Leukocytosis -Received sutures by Ortho on 1/15 at [facility] along with Keflex - was due to see  them on this past Wed but did not make it -1/24   was swollen and weeping profusely likely due to pedal edema - his wound had dehisced and cellulitis was noted.   -After examining wound, Dr. Dr Doreatha Martin was consulted - he evaluated the patient and  removed the sutures- he recommended an antibiotic and it was decided on Doxycyline which has now stopped -He has ordered wet to dry dressing- the foot has subsequently been wrapped with an ACE bandage -Erythema has resolved and swelling is improving with diuresis- would recommend and Doxycycline now stopped but patient developed LLE Erythema so Cardiology started the patient on IV Vancomycin on 05/02/2018 -WBC elevated from yesterday and went from 14.2 -> 18.2 but now trending down and is 14.7  but likely in the setting of CLL; Patient is Afebrile   Anxiety/ Depression - Zoloft daily, Ativan PRN -Palliative Care trying Lorazepam 1 mg qHS for Insomnia and improved   Anemia, chronic Iron Deficiency Anemia -Baseline 8-9- -Hb as been ~ 7-8 range   -Anemia panel shows low normal Ferritin and low Iron saturation with normal IBC - cardiology has given Feraheme  -On Niferex daily as outpt and will continue -Hb/Hct is now 7.8/26.5 -Cardiology would like to hold off on transfusion until Hb is < 7 as he is fluid overloaded  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 -With splenomegaly -Follows a [facility] -Note from 04/01/18 recommends observation only after his treatment and repeat CT in 3 months -Has a mild Leukocytosis at 14.2 that worsened to 18.2 yesterday and now improved to 14.7 but likely suspect from CLL and not infectious as patient is A Febrile   Dry, painful mouth and throat -Discontinued Viscous Lidocaine PRN (was using magic mouthwash for the Lidocaine component at home) and added Magic Mouthwash with Lidocain QID yesterday  -C/w Benzocaine 10% Mucosal Gel Mouth/Throat 4 times dailyPRN  OSA -Started CPAP qHS -Tolerated some but then removed due to waking up frequently to Urinate  Gout -C/w Allopurinol 100 mg po Daily   L knee pain/ Arthritis -C/w Voltaren gel  LLE Erythema and Warmth; slightly improved -Afebrile and has a Mild Leukocytosis which slightly  worsened (14.2 -> 18.2 -> 14.7 in the setting of CLL) -? Venous stasis from Massive Volume Overload vs. True Cellulitis -Elevate Extremities -WOC Nurse to re-evaluate today and deferred to Dr. Haroldine Laws; Had Rolena Infante placed last saturday  -Have asked nurse to mark erythema borders -Was holding off Abx but Cardiology starting IV Vancomycin -Continue to Monitor  Elevated TSH - Likely Sick Euthyroid-  Free T4 is normal  Constipation -C/w Miralax daily and Dulcolax PRN  Abnormal LFT's, improved  -In the setting of Advanced Heart Failure -CMP had not been repeated since 04/25/2018 but repeat this AM has showed LFT's have resolved.  -Continue to Monitor and will not repeat LFT's anymore since they have improved   Hyponatremia -Mild and Stable -Na+ is improved and now 133 -S/p a few doses of Tolvaptan during this Hospitalization -Likely is Hypervolemic Hyponatremia -Continue to Monitor and Trend   Thrombocytopenia -Mild and likely Reactive in the setting of IV Abx -Platelet Count had been dropping the last several days and went from 188 -> 139 but is now improved and 145 -Continue to Monitor for S/Sx of Bleeding -Repeat CBC in AM   DVT prophylaxis: Anticoagulated with Eliquis Code Status: DO NOT RESUSCITATE Family Communication: Discussed with Daughter at bedside Disposition Plan: Pending further improvement and Improvement back to baseline  and Dry Weight   Consultants:   Cardiology  Palliative Care  Orthopedic Surgery   Procedures:   Suture removal 1/24  PICC 1/23   Antimicrobials:  Anti-infectives (From admission, onward)   Start     Dose/Rate Route Frequency Ordered Stop   05/02/18 1400  vancomycin (VANCOCIN) 1,500 mg in sodium chloride 0.9 % 500 mL IVPB     1,500 mg 250 mL/hr over 120 Minutes Intravenous Every 24 hours 05/02/18 1232     04/30/18 2200  doxycycline (VIBRA-TABS) tablet 100 mg     100 mg Oral Every 12 hours 04/30/18 1401 05/01/18 0808    04/26/18 1330  doxycycline (VIBRA-TABS) tablet 100 mg  Status:  Discontinued     100 mg Oral Every 12 hours 04/26/18 1242 04/30/18 1401   04/25/18 1300  terbinafine (LAMISIL) tablet 250 mg     250 mg Oral Daily 04/25/18 1143       Subjective: Seen and Examined at bedside and states that he did not sleep as well as he did the night before. No nausea or vomiting and no trouble breathing but legs are still swollen. Thinks Left leg is not as painful. Asking for Magic Mouthwash frequency to be increased due to dry and painful mouth and throat. No other concerns or complaints at this time.  Objective: Vitals:   05/05/18 0930 05/05/18 2054 05/06/18 0013 05/06/18 0430  BP:  (!) 110/55 (!) 112/57 (!) 123/58  Pulse:  60 60 62  Resp:  20 20 20   Temp: 98.3 F (36.8 C) 98.3 F (36.8 C) 97.8 F (36.6 C) 97.8 F (36.6 C)  TempSrc: Axillary Oral Oral Axillary  SpO2:  94% 97% 93%  Weight:      Height:        Intake/Output Summary (Last 24 hours) at 05/06/2018 0818 Last data filed at 05/06/2018 0300 Gross per 24 hour  Intake 1124.18 ml  Output 3650 ml  Net -2525.82 ml   Filed Weights   05/03/18 2358 05/04/18 0140 05/05/18 0411  Weight: 92.7 kg 92.2 kg 91 kg   Examination: Physical Exam:  Constitutional: WN/WD overweight Caucasian Male in NAD sitting in bed appears calm. Slightly more uncomfortable today given his mouth pain. Eyes: Sclerae anicteric. Lids and conjunctivae normal ENMT: External Ears and Nose appear Normal. Grossly normal hearing.  Neck: Appears supple with some JVD Respiratory: Diminished to auscultation bilaterally with unlabored breathing. No appreciable wheezing/rales/rhonchi Cardiovascular: RRR with HR in the 60's. Has a 2/6 Murmur. Has 2-3+ LE again noted Abdomen: Soft, NT, slightly distended. Bowel sounds present GU: Deferred Musculoskeletal: No contractures or cyanosis; No joint deformities noted. Right PICC in place Skin: Warm and dry and Right Dorsal foot wound  covered in ACE Wrap today. Left Leg erythema is very mild but has significant amount of swelling still. Not as painful to palpate Neurologic: CN 2-12 grossly intact. No appreciable focal deficits Psychiatric: Slightly drepressed appearing. Normal judgement and insight. Awake, Alert, and Oriented x3  Data Reviewed: I have personally reviewed following labs and imaging studies  CBC: Recent Labs  Lab 05/02/18 0259 05/03/18 0441 05/04/18 0500 05/05/18 0408 05/06/18 0438  WBC 15.1* 14.5* 14.2* 18.2* 14.7*  NEUTROABS 11.6* 11.3* 11.3* 14.9* 12.1*  HGB 7.2* 7.6* 7.6* 8.0* 7.8*  HCT 24.6* 25.7* 25.4* 26.8* 26.5*  MCV 91.8 91.1 91.4 89.9 92.0  PLT 179 160 152 139* 683*   Basic Metabolic Panel: Recent Labs  Lab 05/02/18 0259 05/03/18 0441 05/04/18 0500 05/05/18 0408 05/06/18 4196  NA 133* 134* 131* 131* 133*  K 4.0 3.7 2.9* 3.9 3.9  CL 91* 94* 88* 89* 92*  CO2 29 29 30 29 30   GLUCOSE 142* 155* 192* 173* 213*  BUN 45* 43* 41* 43* 53*  CREATININE 1.41* 1.31* 1.32* 1.31* 1.26*  CALCIUM 8.5* 8.5* 8.4* 8.7* 8.5*  MG 2.5* 2.5* 2.3 2.5* 2.3  PHOS 3.8 3.4 2.9 3.2 2.5   GFR: Estimated Creatinine Clearance: 54.9 mL/min (A) (by C-G formula based on SCr of 1.26 mg/dL (H)). Liver Function Tests: Recent Labs  Lab 05/02/18 0259 05/03/18 0441 05/04/18 0500 05/05/18 0408  AST 31 27 28 27   ALT 44 39 33 29  ALKPHOS 125 132* 120 126  BILITOT 0.8 1.0 0.8 0.8  PROT 5.1* 5.2* 5.0* 5.3*  ALBUMIN 2.9* 3.0* 2.9* 2.9*   No results for input(s): LIPASE, AMYLASE in the last 168 hours. No results for input(s): AMMONIA in the last 168 hours. Coagulation Profile: No results for input(s): INR, PROTIME in the last 168 hours. Cardiac Enzymes: No results for input(s): CKTOTAL, CKMB, CKMBINDEX, TROPONINI in the last 168 hours. BNP (last 3 results) No results for input(s): PROBNP in the last 8760 hours. HbA1C: No results for input(s): HGBA1C in the last 72 hours. CBG: No results for input(s):  GLUCAP in the last 168 hours. Lipid Profile: No results for input(s): CHOL, HDL, LDLCALC, TRIG, CHOLHDL, LDLDIRECT in the last 72 hours. Thyroid Function Tests: No results for input(s): TSH, T4TOTAL, FREET4, T3FREE, THYROIDAB in the last 72 hours. Anemia Panel: No results for input(s): VITAMINB12, FOLATE, FERRITIN, TIBC, IRON, RETICCTPCT in the last 72 hours. Sepsis Labs: No results for input(s): PROCALCITON, LATICACIDVEN in the last 168 hours.  No results found for this or any previous visit (from the past 240 hour(s)). Radiology Studies: Dg Chest Port 1 View  Result Date: 05/05/2018 CLINICAL DATA:  Bibasilar crackles EXAM: PORTABLE CHEST 1 VIEW COMPARISON:  04/25/2018 FINDINGS: Stable right PICC. Stable atrial appendage stable. Moderate cardiomegaly. Normal vascularity. Subsegmental atelectasis at the lung bases. No pneumothorax or pleural effusion. IMPRESSION: Cardiomegaly without decompensation. Electronically Signed   By: Marybelle Killings M.D.   On: 05/05/2018 07:57    Scheduled Meds: . allopurinol  100 mg Oral Daily  . amiodarone  200 mg Oral BID  . apixaban  5 mg Oral BID  . diclofenac sodium  2 g Topical QID  . docusate sodium  100 mg Oral Daily  . feeding supplement (ENSURE ENLIVE)  237 mL Oral BID BM  . HYDROcodone-acetaminophen  1 tablet Oral QHS  . iron polysaccharides  150 mg Oral Daily  . LORazepam  1 mg Oral QHS  . LORazepam  1 mg Oral Daily  . metolazone  2.5 mg Oral BID  . multivitamin with minerals  1 tablet Oral Daily  . potassium chloride  60 mEq Oral TID  . sertraline  200 mg Oral Daily  . sodium chloride flush  10-40 mL Intracatheter Q12H  . sodium chloride flush  3 mL Intravenous Q12H  . spironolactone  50 mg Oral BID  . terbinafine  250 mg Oral Daily   Continuous Infusions: . sodium chloride Stopped (05/03/18 1325)  . furosemide 120 mg (05/05/18 2055)  . milrinone 0.5 mcg/kg/min (05/06/18 0231)  . vancomycin 1,500 mg (05/05/18 1640)    LOS: 11 days    Kerney Elbe, DO [facility] PAGER is on [place]  If 7PM-7AM, please contact night-coverage [email or web address] Password [facility] 05/06/2018, 8:18 AM

## 2018-05-06 NOTE — Progress Notes (Signed)
Physical Therapy Treatment Patient Details Name: Antonio Carrillo MRN: [id number] DOB: 1940/04/04 Today's Date: 05/06/2018    History of Present Illness Antonio Carrillo is a 78 y.o. male with medical history significant of CLL, hypertension, hyperlipidemia, paroxysmal A. 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 Was started on a Milrinone gtt and getting diuresed with [facility] Team Following. During the hospitalization his Left Leg developed  new warmth and Erythema and ? Venous Stasis changes vs. Cellulitis but Cardiology started the patient on IV Vancomycin.    PT Comments    Patient seen for activity progression. Mobilizing with use of RW, slow and cautious. Increased time and effort for all activities. Current POC remains appropriate.   Follow Up Recommendations  Home health PT;Supervision/Assistance - 24 hour     Equipment Recommendations  None recommended by PT    Recommendations for Other Services       Precautions / Restrictions Precautions Precautions: Fall Restrictions Weight Bearing Restrictions: No    Mobility  Bed Mobility Overal bed mobility: Needs Assistance Bed Mobility: Supine to Sit     Supine to sit: Min guard     General bed mobility comments: Min guard to come to EOB  Transfers Overall transfer level: Needs assistance Equipment used: Rolling walker (2 wheeled) Transfers: Sit to/from Stand Sit to Stand: Min guard         General transfer comment: No physical assist.  Incr time and effort to come to stand(performed x3)  Ambulation/Gait Ambulation/Gait assistance: Min guard Gait Distance (Feet): 160 Feet Assistive device: Rolling walker (2 wheeled) Gait Pattern/deviations: Step-through pattern;Decreased stride length   Gait velocity  interpretation: <1.31 ft/sec, indicative of household ambulator General Gait Details: Slow and cautious with ambulation. One standing rest break. No physical assist required   Stairs             Wheelchair Mobility    Modified Rankin (Stroke Patients Only)       Balance Overall balance assessment: Needs assistance Sitting-balance support: No upper extremity supported;Feet supported Sitting balance-Leahy Scale: Fair     Standing balance support: Bilateral upper extremity supported;During functional activity Standing balance-Leahy Scale: Poor Standing balance comment: Use of Rw for external support                            Cognition Arousal/Alertness: Awake/alert Behavior During Therapy: WFL for tasks assessed/performed Overall Cognitive Status: Within Functional Limits for tasks assessed                                        Exercises General Exercises - Lower Extremity Ankle Circles/Pumps: AROM;Both;10 reps;Supine    General Comments        Pertinent Vitals/Pain Pain Assessment: No/denies pain    Home Living                      Prior Function            PT Goals (current goals can now be found in the care plan section) Acute Rehab PT Goals Patient Stated Goal: to go home PT Goal Formulation: With patient Time For Goal Achievement: 05/18/18 Potential to Achieve Goals:  Good Progress towards PT goals: Progressing toward goals    Frequency    Min 3X/week      PT Plan Current plan remains appropriate    Co-evaluation              AM-PAC PT "6 Clicks" Mobility   Outcome Measure  Help needed turning from your back to your side while in a flat bed without using bedrails?: A Little Help needed moving from lying on your back to sitting on the side of a flat bed without using bedrails?: A Little Help needed moving to and from a bed to a chair (including a wheelchair)?: A Little Help needed standing up  from a chair using your arms (e.g., wheelchair or bedside chair)?: A Little Help needed to walk in hospital room?: A Little Help needed climbing 3-5 steps with a railing? : A Little 6 Click Score: 18    End of Session Equipment Utilized During Treatment: Gait belt Activity Tolerance: Patient limited by fatigue Patient left: with call bell/phone within reach;in bed;with family/visitor present Nurse Communication: Mobility status PT Visit Diagnosis: Unsteadiness on feet (R26.81);Muscle weakness (generalized) (M62.81)     Time: 2440-1027 PT Time Calculation (min) (ACUTE ONLY): 17 min  Charges:  $Gait Training: 8-22 mins                     Alben Deeds, PT DPT  Board Certified Neurologic Specialist [place] Pager [phone] Office [place] 05/06/2018, 11:12 AM

## 2018-05-06 NOTE — Progress Notes (Addendum)
Patient ID: Antonio Carrillo, male   DOB: 06/11/1940, 78 y.o.   MRN: [id number]     Advanced Heart Failure Rounding Note  PCP-Cardiologist: No primary care provider on file.   Subjective:    Admitted from clinic 04/25/2018 with marked volume overloaded and abdominal distention.   Coox 59.9% on milrinone 0.5. Negative 2.5 L on on Lasix 120 mg IV TID, spiro 50 mg BID, and metolazone 2.5 mg BID. On vancomycin for LLE cellulitis.   K 3.9.  Feeling better this am.   CVP 11-12 cm. Daughter present. He feels he has "a lot left to do" in his life, and remains optimistic about recovery. Remains swollen into thighs. SOB and fatigue somewhat improved.   Echo 04/25/2018: LV EF 55-60%, D-shaped interventricular septum with RV enlargement and dysfunction, severe TR.   Objective:   Weight Range: 91 kg Body mass index is 27.98 kg/m.   Vital Signs:   Temp:  [97.6 F (36.4 C)-98.3 F (36.8 C)] 97.6 F (36.4 C) (02/03 0821) Pulse Rate:  [60-65] 65 (02/03 0821) Resp:  [19-20] 19 (02/03 0821) BP: (110-131)/(55-61) 131/61 (02/03 0821) SpO2:  [93 %-100 %] 100 % (02/03 0821) Last BM Date: 05/06/18  Weight change: Filed Weights   05/03/18 2358 05/04/18 0140 05/05/18 0411  Weight: 92.7 kg 92.2 kg 91 kg   Intake/Output:   Intake/Output Summary (Last 24 hours) at 05/06/2018 0850 Last data filed at 05/06/2018 0300 Gross per 24 hour  Intake 1124.18 ml  Output 3650 ml  Net -2525.82 ml    Physical Exam   General: NAD.  HEENT: Normal Neck: Supple. JVP 11-12 cm. Carotids 2+ bilat; no bruits. No thyromegaly or nodule noted. Cor: PMI nondisplaced. RRR, 2/6 TR, + RV lift.  Lungs: Mild basilar crackls Abdomen: Soft, non-tender, + distended, no HSM. No bruits or masses. +BS  Extremities: No cyanosis or clubbing. R and LLE no edema.  Neuro: Alert & orientedx3, cranial nerves grossly intact. moves all 4 extremities w/o difficulty. Affect pleasant   Telemetry   NSR 60s, at times appears to be going in and  out of Afib, personally reviewed.   Labs    CBC Recent Labs    05/05/18 0408 05/06/18 0438  WBC 18.2* 14.7*  NEUTROABS 14.9* 12.1*  HGB 8.0* 7.8*  HCT 26.8* 26.5*  MCV 89.9 92.0  PLT 139* 409*   Basic Metabolic Panel Recent Labs    05/05/18 0408 05/06/18 0438  NA 131* 133*  K 3.9 3.9  CL 89* 92*  CO2 29 30  GLUCOSE 173* 213*  BUN 43* 53*  CREATININE 1.31* 1.26*  CALCIUM 8.7* 8.5*  MG 2.5* 2.3  PHOS 3.2 2.5   Liver Function Tests Recent Labs    05/04/18 0500 05/05/18 0408  AST 28 27  ALT 33 29  ALKPHOS 120 126  BILITOT 0.8 0.8  PROT 5.0* 5.3*  ALBUMIN 2.9* 2.9*   No results for input(s): LIPASE, AMYLASE in the last 72 hours. Cardiac Enzymes No results for input(s): CKTOTAL, CKMB, CKMBINDEX, TROPONINI in the last 72 hours.  BNP: BNP (last 3 results) Recent Labs    04/25/18 0949  BNP 1,544.3*    ProBNP (last 3 results) No results for input(s): PROBNP in the last 8760 hours.   D-Dimer No results for input(s): DDIMER in the last 72 hours. Hemoglobin A1C No results for input(s): HGBA1C in the last 72 hours. Fasting Lipid Panel No results for input(s): CHOL, HDL, LDLCALC, TRIG, CHOLHDL, LDLDIRECT in the last 72  hours. Thyroid Function Tests No results for input(s): TSH, T4TOTAL, T3FREE, THYROIDAB in the last 72 hours.  Invalid input(s): FREET3  Other results:   Imaging    No results found.   Medications:     Scheduled Medications: . allopurinol  100 mg Oral Daily  . amiodarone  200 mg Oral BID  . apixaban  5 mg Oral BID  . diclofenac sodium  2 g Topical QID  . docusate sodium  100 mg Oral Daily  . feeding supplement (ENSURE ENLIVE)  237 mL Oral BID BM  . HYDROcodone-acetaminophen  1 tablet Oral QHS  . iron polysaccharides  150 mg Oral Daily  . LORazepam  1 mg Oral QHS  . LORazepam  1 mg Oral Daily  . metolazone  2.5 mg Oral BID  . multivitamin with minerals  1 tablet Oral Daily  . potassium chloride  60 mEq Oral TID  .  sertraline  200 mg Oral Daily  . sodium chloride flush  10-40 mL Intracatheter Q12H  . sodium chloride flush  3 mL Intravenous Q12H  . spironolactone  50 mg Oral BID  . terbinafine  250 mg Oral Daily    Infusions: . sodium chloride Stopped (05/03/18 1325)  . furosemide 120 mg (05/05/18 2055)  . milrinone 0.5 mcg/kg/min (05/06/18 0231)  . vancomycin 1,500 mg (05/05/18 1640)    PRN Medications: sodium chloride, acetaminophen **OR** acetaminophen, benzocaine, bisacodyl, HYDROcodone-acetaminophen, lidocaine, LORazepam, magic mouthwash w/lidocaine, ondansetron **OR** ondansetron (ZOFRAN) IV, prochlorperazine, senna, sodium chloride flush, sodium chloride flush, white petrolatum  Patient Profile   Antonio Carrillo is a 78 y.o. male with h/o AF s/p several ablations, aortic root aneurysm, s/p aortic root replacement with CABG 1, RV failure, CLL, and ascites.   Sent for admission from HF clinic 04/25/2018 with marked volume overload on exam, RLE wound, and marked ascites.   Assessment/Plan   1. 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 [facility]MRI 4/18 [facility] with normal LV function. RV severely dilated and mild RV dysfunction.  Echo this admission with LV EF 55-60%, RV dilated and dysfunctional, severe TR, D-shaped septum.  - Co-ox 59.9% on milrinone 0.5 mcg/kg/min. No wean until fully diuresed - Weight down another 3 lbs. 26 lbs total. Still another 10+ lbs to go.  - Continue 120 mg IV lasix tid + metolazone 2.5 mg twice a day. Can switch back to lasix gtt at 20/hr as needed - Continue spiro 50 mg BID to help limit hypokalemia - Long-term prognosis is quite poor.  With RV failure, not candidate for LVAD and also would be poor HD candidate. Palliative Care following. He has been resistant to [place] discussions. He is hopeful that he may get some recovery. He was very satisfied with his QOL recently and would like to get back to that. We  will continue inotrope support and diuresis.  2. PAF: S/p multiple ablations and MAZE.  - Remains in NSR.  - Continue amiodarone 200 mg BID while on milrinone.   - Now off digoxin. Creatinine improving maybe able to restart soon.  - Continue Eliquis. No bleeding issues. Hgb 7.8. Plan as below.  3. CLL: Per Oncology.  Now s/p pelvic XRT. No change.   4. CAD and CABG x 1:  - No s/s of ischemia.    -  No ASA given apixaban use.     5. Aortic root aneurysm s/p repair  6. RLE laceration: Appreciate WOC  and Ortho input. No fever or chills.  - Management per Ortho. Appreciate their input. No change.  7. Iron deficiency Anemia: No overt bleeding. Remains on apixaban.   - With marked volume overload, would not transfuse until Hgb < 7.  Hgb relatively stable at 7.8.  Transferrin saturation low. Hgb pending this am.  - Received feraheme 11/25.  8. Ascites: Persists.  S/p 2 paracenteses recently with 4.1 and 1.3 L out. 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 Cytology from paracentesis has repeatedly been negative for malignancy - Consider repeat paracentesis as needed.  9. Hyponatremia:  - Sodium dropped to 119.. Improved after a couple doses of  tolvaptan.  - Sodium stable at 133 today.  - Continue free water restrict.  10. Hypokalemia - Potassium improving but still a bit low. Continue spiro 50 bid.  - Continue K 60 meq TID.  - Continue spiro 50 mg BID 11. Hypotension - Stable.  - Consider midodrine as needed 12. LLE cellulitis  - Improving.  Day #5 of Vancomycin.    Shirley Friar, PA-C  05/06/2018 8:50 AM   Advanced Heart Failure Team Pager [phone] (M-F; 7a - 4p)  Please contact [facility] for night-coverage after hours (4p -7a ) and weekends on [email or web address]  Patient seen and examined with the above-signed Advanced Practice Provider and/or Housestaff. I personally reviewed laboratory data, imaging studies and relevant notes. I independently examined the  patient and formulated the important aspects of the plan. I have edited the note to reflect any of my changes or salient points. I have personally discussed the plan with the patient and/or family.  He remains on IV milrinone for end-stage RV failure. Co-ox improved. Continue to diurese steadily. CVP 12. Weight down almost 30 pounds. Renal function improved. Electrolytes have stabilized with aggressive support.   Will continue to diurese. Suspect he has another 10# of fluid on board. Continue vancomycin for cellulitis.   Glori Bickers, MD  1:58 PM

## 2018-05-06 NOTE — Progress Notes (Signed)
Nutrition Follow Up  DOCUMENTATION CODES:   Not applicable  INTERVENTION:    Ensure Enlive po BID, each supplement provides 350 kcal and 20 grams of protein  NEW NUTRITION DIAGNOSIS:   Increased nutrient needs related to chronic illness, wound healing as evidenced by estimated needs, ongoing  GOAL:   Patient will meet greater than or equal to 90% of their needs, progressing  MONITOR:   PO intake, Supplement acceptance, Weight trends, Labs, I & O's  ASSESSMENT:   78 y.o. male with medical history significant of CLL, HTN, hyperlipidemia, A. 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 Patient presented to the ED from Cardiology office with profound leg swelling and not responding to oral diuretics at home.   Advanced Heart Failure Team note reviewed. Pt with acute on chronic CHF with prominent RV failure. 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  PO intake improved at 75% per flowsheet records. Taking some of his Ensure Enlive supplements. Labs & medications reviewed. Na 133 (L).  Palliative Medicine Team notes reviewed. Poor long-term prognosis.  Diet Order:   Diet Order            Diet Heart Room service appropriate? Yes; Fluid consistency: Thin  Diet effective now             EDUCATION NEEDS:   Education needs have been addressed  Skin:  Skin Assessment: Skin Integrity Issues: Skin Integrity Issues:: Other (Comment) Other: R foot wound/dorsal laceration  Last BM:  2/3   Intake/Output Summary (Last 24 hours) at 05/06/2018 1251 Last data filed at 05/06/2018 1149 Gross per 24 hour  Intake 1174.18 ml  Output 4825 ml  Net -3650.82 ml   Height:   Ht Readings from Last 1 Encounters:  04/25/18 5\' 11"  (1.803 m)   Weight:   Wt Readings from Last 1 Encounters:  05/05/18 91 kg   Ideal Body Weight:  78.18 kg  BMI:  Body mass index is 27.98 kg/m.  Estimated Nutritional Needs:   Kcal:  1900-2100  Protein:  85-100 gm  Fluid:   per MD  Arthur Holms, RD, LDN Pager #: [phone] After-Hours Pager #: [phone]

## 2018-05-07 LAB — COMPREHENSIVE METABOLIC PANEL
ALBUMIN: 2.9 g/dL — AB (ref 3.5–5.0)
ALT: 25 U/L (ref 0–44)
AST: 24 U/L (ref 15–41)
Alkaline Phosphatase: 148 U/L — ABNORMAL HIGH (ref 38–126)
Anion gap: 11 (ref 5–15)
BUN: 45 mg/dL — ABNORMAL HIGH (ref 8–23)
CO2: 32 mmol/L (ref 22–32)
Calcium: 8.7 mg/dL — ABNORMAL LOW (ref 8.9–10.3)
Chloride: 89 mmol/L — ABNORMAL LOW (ref 98–111)
Creatinine, Ser: 1.12 mg/dL (ref 0.61–1.24)
GFR calc Af Amer: >60 mL/min (ref >60)
GFR calc non Af Amer: >60 mL/min (ref >60)
GLUCOSE: 227 mg/dL — AB (ref 70–99)
Potassium: 4.6 mmol/L (ref 3.5–5.1)
Sodium: 132 mmol/L — ABNORMAL LOW (ref 135–145)
Total Bilirubin: 0.5 mg/dL (ref 0.3–1.2)
Total Protein: 5.2 g/dL — ABNORMAL LOW (ref 6.5–8.1)

## 2018-05-07 LAB — CBC WITH DIFFERENTIAL/PLATELET
Abs Immature Granulocytes: 0.06 10*3/uL (ref 0.00–0.07)
BASOS PCT: 0 %
Basophils Absolute: 0 10*3/uL (ref 0.0–0.1)
Eosinophils Absolute: 0.1 10*3/uL (ref 0.0–0.5)
Eosinophils Relative: 1 %
HCT: 26.6 % — ABNORMAL LOW (ref 39.0–52.0)
Hemoglobin: 8.2 g/dL — ABNORMAL LOW (ref 13.0–17.0)
Immature Granulocytes: 1 %
Lymphocytes Relative: 10 %
Lymphs Abs: 1.2 10*3/uL (ref 0.7–4.0)
MCH: 28.5 pg (ref 26.0–34.0)
MCHC: 30.8 g/dL (ref 30.0–36.0)
MCV: 92.4 fL (ref 80.0–100.0)
Monocytes Absolute: 1 10*3/uL (ref 0.1–1.0)
Monocytes Relative: 8 %
Neutro Abs: 10.1 10*3/uL — ABNORMAL HIGH (ref 1.7–7.7)
Neutrophils Relative %: 80 %
Platelets: 146 10*3/uL — ABNORMAL LOW (ref 150–400)
RBC: 2.88 MIL/uL — ABNORMAL LOW (ref 4.22–5.81)
RDW: 17.2 % — ABNORMAL HIGH (ref 11.5–15.5)
WBC: 12.5 10*3/uL — ABNORMAL HIGH (ref 4.0–10.5)
nRBC: 0 % (ref 0.0–0.2)

## 2018-05-07 LAB — COOXEMETRY PANEL
Carboxyhemoglobin: 0.4 % — ABNORMAL LOW (ref 0.5–1.5)
METHEMOGLOBIN: 1.3 % (ref 0.0–1.5)
O2 Saturation: 52.1 %
Total hemoglobin: 8.3 g/dL — ABNORMAL LOW (ref 12.0–16.0)

## 2018-05-07 LAB — PHOSPHORUS: Phosphorus: 2.6 mg/dL (ref 2.5–4.6)

## 2018-05-07 LAB — MAGNESIUM: Magnesium: 2.2 mg/dL (ref 1.7–2.4)

## 2018-05-07 MED ORDER — MAGIC MOUTHWASH W/LIDOCAINE
5.0000 mL | ORAL | Status: DC | PRN
  Administered 2018-05-07 – 2018-05-15 (×15): 5 mL via ORAL
  Filled 2018-05-07 (×19): qty 5

## 2018-05-07 MED ORDER — SENNOSIDES-DOCUSATE SODIUM 8.6-50 MG PO TABS
1.0000 | ORAL_TABLET | Freq: Two times a day (BID) | ORAL | Status: DC
  Administered 2018-05-07 – 2018-05-15 (×10): 1 via ORAL
  Filled 2018-05-07 (×14): qty 1

## 2018-05-07 MED ORDER — POLYETHYLENE GLYCOL 3350 17 G PO PACK
17.0000 g | PACK | Freq: Two times a day (BID) | ORAL | Status: DC
  Administered 2018-05-07 – 2018-05-15 (×9): 17 g via ORAL
  Filled 2018-05-07 (×14): qty 1

## 2018-05-07 MED ORDER — LORAZEPAM 0.5 MG PO TABS
0.5000 mg | ORAL_TABLET | Freq: Every day | ORAL | Status: DC
  Filled 2018-05-07: qty 1

## 2018-05-07 MED ORDER — POTASSIUM CHLORIDE CRYS ER 20 MEQ PO TBCR
60.0000 meq | EXTENDED_RELEASE_TABLET | Freq: Two times a day (BID) | ORAL | Status: DC
  Administered 2018-05-07 – 2018-05-08 (×3): 60 meq via ORAL
  Filled 2018-05-07 (×3): qty 3

## 2018-05-07 NOTE — Progress Notes (Signed)
Pt refuses to wear CPAP for the night.  

## 2018-05-07 NOTE — Progress Notes (Addendum)
Patient ID: Antonio Carrillo, male   DOB: 05/23/1940, 78 y.o.   MRN: [id number]     Advanced Heart Failure Rounding Note  PCP-Cardiologist: No primary care provider on file.   Subjective:    Admitted from clinic 04/25/2018 with marked volume overloaded and abdominal distention.   Coox 52.1% on milrinone 0.5. Negative 3.9 L on Lasix 120 mg IV TID, spiro 50 mg BID, and metolazone 2.5 mg BID. On vancomycin for LLE cellulitis.  K 4.6. Cr 1.12.  CVP ~13-14 this am  Feeling about the same this am. Slowly improving. Appetite OK. SOB hasn't really been an issue. Has been having problems with glossitis. Getting Magic Mouthwash with some relief. This is a recurrent issue. Has had on and off for several years since his cancer treatment.   Echo 04/25/2018: LV EF 55-60%, D-shaped interventricular septum with RV enlargement and dysfunction, severe TR.   Objective:   Weight Range: 88.6 kg Body mass index is 27.25 kg/m.   Vital Signs:   Temp:  [97.5 F (36.4 C)-97.8 F (36.6 C)] 97.6 F (36.4 C) (02/04 0437) Pulse Rate:  [63-68] 64 (02/04 0437) Resp:  [17-27] 25 (02/04 0437) BP: (115-135)/(58-71) 115/60 (02/04 0437) SpO2:  [95 %-100 %] 100 % (02/04 0437) Weight:  [88.6 kg] 88.6 kg (02/04 0047) Last BM Date: 05/06/18  Weight change: Filed Weights   05/04/18 0140 05/05/18 0411 05/07/18 0047  Weight: 92.2 kg 91 kg 88.6 kg   Intake/Output:   Intake/Output Summary (Last 24 hours) at 05/07/2018 0843 Last data filed at 05/07/2018 0730 Gross per 24 hour  Intake 1344 ml  Output 5300 ml  Net -3956 ml    Physical Exam   CVP ~13-14 this am  General: NAD HEENT: Normal Neck: Supple. JVP to jaw. Carotids 2+ bilat; no bruits. No thyromegaly or nodule noted. Cor: PMI nondisplaced. RRR, 2/6 TR, + RV lift Lungs: Mild basilar crackles.  Abdomen: Soft, non-tender, + distended, no HSM. No bruits or masses. +BS  Extremities: No cyanosis, clubbing, or rash. R and LLE no edema.  Neuro: Alert & orientedx3,  cranial nerves grossly intact. moves all 4 extremities w/o difficulty. Affect pleasant  GI: + glossitis  Telemetry   NSR 60s, personally reviewed.   Labs    CBC Recent Labs    05/06/18 0438 05/07/18 0448  WBC 14.7* 12.5*  NEUTROABS 12.1* 10.1*  HGB 7.8* 8.2*  HCT 26.5* 26.6*  MCV 92.0 92.4  PLT 145* 250*   Basic Metabolic Panel Recent Labs    05/06/18 0438 05/07/18 0448  NA 133* 132*  K 3.9 4.6  CL 92* 89*  CO2 30 32  GLUCOSE 213* 227*  BUN 53* 45*  CREATININE 1.26* 1.12  CALCIUM 8.5* 8.7*  MG 2.3 2.2  PHOS 2.5 2.6   Liver Function Tests Recent Labs    05/05/18 0408 05/07/18 0448  AST 27 24  ALT 29 25  ALKPHOS 126 148*  BILITOT 0.8 0.5  PROT 5.3* 5.2*  ALBUMIN 2.9* 2.9*   No results for input(s): LIPASE, AMYLASE in the last 72 hours. Cardiac Enzymes No results for input(s): CKTOTAL, CKMB, CKMBINDEX, TROPONINI in the last 72 hours.  BNP: BNP (last 3 results) Recent Labs    04/25/18 0949  BNP 1,544.3*    ProBNP (last 3 results) No results for input(s): PROBNP in the last 8760 hours.   D-Dimer No results for input(s): DDIMER in the last 72 hours. Hemoglobin A1C No results for input(s): HGBA1C in the last 72  hours. Fasting Lipid Panel No results for input(s): CHOL, HDL, LDLCALC, TRIG, CHOLHDL, LDLDIRECT in the last 72 hours. Thyroid Function Tests No results for input(s): TSH, T4TOTAL, T3FREE, THYROIDAB in the last 72 hours.  Invalid input(s): FREET3  Other results:   Imaging    No results found.   Medications:     Scheduled Medications: . allopurinol  100 mg Oral Daily  . amiodarone  200 mg Oral BID  . apixaban  5 mg Oral BID  . diclofenac sodium  2 g Topical QID  . docusate sodium  100 mg Oral Daily  . feeding supplement (ENSURE ENLIVE)  237 mL Oral BID BM  . iron polysaccharides  150 mg Oral Daily  . LORazepam  1 mg Oral QHS  . LORazepam  1 mg Oral Daily  . metolazone  2.5 mg Oral BID  . multivitamin with minerals  1  tablet Oral Daily  . potassium chloride  60 mEq Oral TID  . sertraline  200 mg Oral Daily  . sodium chloride flush  10-40 mL Intracatheter Q12H  . sodium chloride flush  3 mL Intravenous Q12H  . spironolactone  50 mg Oral BID  . terbinafine  250 mg Oral Daily    Infusions: . sodium chloride Stopped (05/03/18 1325)  . furosemide 120 mg (05/07/18 0834)  . milrinone 0.5 mcg/kg/min (05/07/18 0705)  . vancomycin 1,500 mg (05/06/18 1553)    PRN Medications: sodium chloride, acetaminophen **OR** acetaminophen, benzocaine, bisacodyl, HYDROcodone-acetaminophen, lidocaine, LORazepam, magic mouthwash w/lidocaine, ondansetron **OR** ondansetron (ZOFRAN) IV, prochlorperazine, senna, sodium chloride flush, sodium chloride flush, white petrolatum  Patient Profile   Antonio Carrillo is a 78 y.o. male with h/o AF s/p several ablations, aortic root aneurysm, s/p aortic root replacement with CABG 1, RV failure, CLL, and ascites.   Sent for admission from HF clinic 04/25/2018 with marked volume overload on exam, RLE wound, and marked ascites.   Assessment/Plan   1. 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 cMRI 4/18 at [facility] with normal LV function. RV severely dilated and mild RV dysfunction.  Echo this admission with LV EF 55-60%, RV dilated and dysfunctional, severe TR, D-shaped septum.  - Co-ox 52.1% on milrinone 0.5 mcg/kg/min. No wean until fully diuresed.  - Weight down another 5 lbs. 31 lbs. Volume status remains elevated.  - Continue 120 mg IV lasix tid + metolazone 2.5 mg twice a day with good response. Can switch back to lasix gtt at 20/hr as needed - Continue spiro 50 mg BID to help limit hypokalemia - Long-term prognosis is quite poor.  With RV failure, not candidate for LVAD and also would be poor HD candidate. Palliative Care following. He has been resistant to [place] discussions. He is hopeful that he may get some recovery. He  was very satisfied with his QOL recently and would like to get back to that. We will continue inotrope support and diuresis.  2. PAF: S/p multiple ablations and MAZE.  - Remains in NSR.   - Continue amiodarone 200 mg BID while on milrinone.   - Now off digoxin. Creatinine improving maybe able to restart soon.  - Continue Eliquis. No bleeding issues. Hgb 8.2. Plan as below.  3. CLL: Per Oncology.  Now s/p pelvic XRT. No change.    4. CAD and CABG x 1:  - No s/s of ischemia.    - No ASA given apixaban use.  5. Aortic root aneurysm s/p repair  6. RLE laceration: Appreciate WOC and Ortho input. No fever or chills.  - Management per Ortho. Appreciate their input. No change.   7. Iron deficiency Anemia: No overt bleeding. Remains on apixaban.   - With marked volume overload, would not transfuse until Hgb < 7.  Hgb 8.2. - Transferrin saturation low.  - Received feraheme 11/25.  8. Ascites: Persists.  S/p 2 paracenteses recently with 4.1 and 1.3 L out. 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 Cytology from paracentesis has repeatedly been negative for malignancy - Consider repeat paracentesis as needed. Stable.  9. Hyponatremia:  - Sodium dropped to 119.. Improved after a couple doses of  tolvaptan.  - Sodium stable at 132.  - Continue free water restrict.  10. Hypokalemia - Potassium improving but still a bit low. Continue spiro 50 bid.  - Continue K 60 meq TID.  - Continue spiro 50 mg BID 11. Hypotension - Stable. - Consider midodrine as needed 12. LLE cellulitis  - Improving. Day #5 of Vancomycin.   13. 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  Shirley Friar, PA-C  05/07/2018 8:43 AM   Advanced Heart Failure Team Pager [phone] (M-F; 7a - 4p)  Please contact [facility] for night-coverage after hours (4p -7a ) and weekends on [email or web address]  Patient seen and examined with the above-signed  Advanced Practice Provider and/or Housestaff. I personally reviewed laboratory data, imaging studies and relevant notes. I independently examined the patient and formulated the important aspects of the plan. I have edited the note to reflect any of my changes or salient points. I have personally discussed the plan with the patient and/or family.  Remains on milrinone 0.5 for end-stage RV failure. Continues to diurese on IV lasix. Weight down almost 30 pounds now. CVP checked personally while he was sitting up 7-8. Cellulitis LLE improved. Renal function improved. Remains in NSR. Likely can stop IV lasix tomorrow or Thursday and begin slow wean of milrinone. I increased the frequency of his magic mouthwash for possible glossitis.   Glori Bickers, MD  11:34 PM

## 2018-05-07 NOTE — Progress Notes (Signed)
PROGRESS NOTE    Antonio Carrillo  OZH:[id number] DOB: May 01, 1940 DOA: 04/25/2018 PCP: Patient, No Pcp Per   Brief Narrative:  Antonio Carrillo a 78 y.o.malewith medical history significant ofCLL, hypertension, hyperlipidemia, paroxysmal A. 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 Was started on a Milrinone gtt and getting diuresed with [facility] Following. During the hospitalization his Left Leg developed  new warmth and Erythema and ? Venous Stasis changes vs. Cellulitis but Cardiology started the patient on IV Vancomycin.  Slowly improving with diuresis. Still complaining of a lot of Mouth Pain and states that this is the worst it has been however this is been going on for several years now.  Assessment & Plan:   Principal Problem:   Acute on chronic combined systolic and diastolic CHF (congestive heart failure) (HCC) Active Problems:   ATRIAL FIBRILLATION   AKI (acute kidney injury) (HCC)   Hyperkalemia   Ascites   CLL (chronic lymphocytic leukemia) (HCC)   Anemia   CHF (congestive heart failure) (HCC)   Palliative care encounter   Right foot injury   Chronic bilateral low back pain without sciatica   Palliative care by specialist   Anxiety state  Acute on chronic combined systolic and diastolic CHF with hyponatremia, ascites, pedal edema - Appreciate management by cardiology  -palliative care discussions underway per cardiology -Currently getting diuresed with IV Lasix 120 mg TID and on Spironolactone 50 mg po BID; Per Cards can switch back to Lasix gtt at 20/hr when K+ is improved but this was before it was realized that he lost 1 lb in an hour and not in a day; Will defer to Cardiology for whether to initiate Lasix gtt and we will continue the IV Lasix 120  mg TID at this time.  -Cardiology also restarted Metolazone 2.5 mg BID -C/w Potassium Replacement with 60 mEQ of po KCl TID -Further Diuresis per Cardiology Advanced Heart Failure Team -C/w Milrinone gtt and wean per Cardiology recc's; Co-ox this AM was 52.1; Per Cardiology will not wean Milrinone until further diuresed  -Strict I's/O's and Daily Weights -ECHOCardiogram on 04/25/2018 showed Normal LV systolic function; mild LVE; mild AI; mild MR; severe   biatrial enlargement; mild RVE with moderate RV dysfunction; severe TR; cannot estimate pulmonary pressures due to severity of TR but D shaped septum suggests pulmonary hypertension. -Patient is -21.133 Liters since Admission -Weight is down from 223 lbs -> 195 lbs -CVP is trending down is now around 13-14 this a.m. -Repeat CXR (05/05/2018) showed "Stable right PICC. Stable atrial appendage stable. Moderate cardiomegaly. Normal vascularity. Subsegmental atelectasis at the lung bases. No pneumothorax or pleural effusion." -Continue to Monitor Volume Status Carefully and is improving  PAF -Cardiology following -Has had Multiple Ablations and MAZE -Patient is on Amiodarone 200 BID and Eliquis -Currently Maintaining NSR but has been in and out of AFib  AKI (acute kidney injury)  -Baseline < 1- on admission ~ 2.5 -Likely cardiorenal- improving with management of heart failure -BUN/Cr is now improved to 45/1.12 -Continue to Monitor and Trend while he is getting diuresed and now that He will be started on Antibiotics with IV Vancomycin  Hypokalemia, improved -K+ this AM was 4.6; Mag Level is 2.2 -Continue to Monitor and and Replete as necessary; Currently getting 60 mgEQ po TID -Repeat  CMP ion AM   Right foot wound/Dorsal Laceration- Leukocytosis -Received sutures by Ortho on 1/15 at [facility] along with Keflex - was due to see them on this past Wed but did not make it -1/24   was swollen and weeping profusely likely due to pedal edema - his  wound had dehisced and cellulitis was noted.   -After examining wound, Dr. Dr Doreatha Martin was consulted - he evaluated the patient and removed the sutures- he recommended an antibiotic and it was decided on Doxycyline which has now stopped -He has ordered wet to dry dressing- the foot has subsequently been wrapped with an ACE bandage -Erythema has resolved and swelling is improving with diuresis- would recommend and Doxycycline now stopped but patient developed LLE Erythema so Cardiology started the patient on IV Vancomycin on 05/02/2018 and this will likely stop today as well but will defer to cardiology to stop given that they initiated -WBC elevated from yesterday and went from 14.2 -> 18.2 but now trending down and is 12.5  but likely in the setting of CLL; Patient is Afebrile   Anxiety/ Depression - Zoloft daily, Ativan PRN -Palliative Care trying Lorazepam 1 mg qHS for Insomnia and improved   Anemia, chronic Iron Deficiency Anemia -Baseline 8-9- -Hb as been ~ 7-8 range   -Anemia panel shows low normal Ferritin and low Iron saturation with normal TIBC  -Cardiology has given Feraheme  -On Iron Polysaccharides 150 mg po daily as outpt and will continue while hospitalized  -Hb/Hct is now 8.2/26.6 -Cardiology would like to hold off on transfusion until Hb is < 7 as he is fluid overloaded  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 -With splenomegaly -Follows a [facility] -Note from 04/01/18 recommends observation only after his treatment and repeat CT in 3 months -Has a mild Leukocytosis that worsened to 18.2 but now trended down to 12.5 and likely suspect from CLL and not infectious as patient is A Febrile   Dry, painful mouth and throat -This has been a chronic issue for several years now since undergoing treatment for his CLL -Discontinued Viscous Lidocaine PRN (was using magic mouthwash for the Lidocaine component at home) and added Magic Mouthwash with Lidocain QID  yesterday  -C/w Benzocaine 10% Mucosal Gel Mouth/Throat 4 times dailyPRN -Consider outpatient ENT evaluation or GI evaluation -No evidence of thrush  OSA -Started CPAP qHS -Tolerated some but then removed due to waking up frequently to Urinate  Gout -C/w Allopurinol 100 mg po Daily   L knee pain/ Arthritis -C/w Voltaren gel  LLE Erythema and Warmth; slightly improved -Afebrile and has a Mild Leukocytosis which slightly worsened but now improving (14.2 -> 18.2 -> 14.7 -> 12.5in the setting of CLL) -? Venous stasis from Massive Volume Overload vs. True Cellulitis -Elevate Extremities -WOC Nurse to re-evaluate today and deferred to Dr. Haroldine Laws; Had Rolena Infante placed last saturday  -Have asked nurse to mark erythema borders -Was holding off Abx but Cardiology starting IV Vancomycin -Continue to Monitor  Elevated TSH - Likely Sick Euthyroid-  Free T4 is normal  Constipation -C/w Miralax daily and Dulcolax PRN  Abnormal LFT's, improved  -In the setting of Advanced Heart Failure -CMP had not been repeated since 04/25/2018 but repeat this AM has showed LFT's have resolved.  -Continue to Monitor and will not repeat LFT's anymore since they have improved   Hyponatremia -Mild and Stable -Na+ is improved and now 132 -S/p a few doses of Tolvaptan during this Hospitalization -Likely is  Hypervolemic Hyponatremia -Continue to Monitor and Trend   Thrombocytopenia -Mild and likely Reactive in the setting of IV Abx -Platelet Count had been dropping the last several days and went from 188 -> 139 but is now improved and 146 -Continue to Monitor for S/Sx of Bleeding -Repeat CBC in AM   Hyperglycemia -Patient's Blood Glucose has been elevated on CMP/BMP's and raning from 142-227 -Check HbA1c in AM -Continue to Monitor Blood Sugars Carefully and if necessary will place on Sensitive Novolog SSI AC  Constipation -Has been Docusate 100 mg po Daily and Senna 8.6 mg po Daily prn but will  D/C and start a more aggressive Bowel Regimen with Senna-Docusate 1 tab BID, Miralax 17 grams BID -Continue with Bisacodyl 10 mg Suppositories RCprn   DVT prophylaxis: Anticoagulated with Eliquis Code Status: DO NOT RESUSCITATE Family Communication: Discussed with Daughter at bedside Disposition Plan: Pending further improvement and Improvement back to baseline and Dry Weight   Consultants:   Cardiology  Palliative Care  Orthopedic Surgery   Procedures:   Suture removal 1/24  PICC 1/23   Antimicrobials:  Anti-infectives (From admission, onward)   Start     Dose/Rate Route Frequency Ordered Stop   05/02/18 1400  vancomycin (VANCOCIN) 1,500 mg in sodium chloride 0.9 % 500 mL IVPB     1,500 mg 250 mL/hr over 120 Minutes Intravenous Every 24 hours 05/02/18 1232     04/30/18 2200  doxycycline (VIBRA-TABS) tablet 100 mg     100 mg Oral Every 12 hours 04/30/18 1401 05/01/18 0808   04/26/18 1330  doxycycline (VIBRA-TABS) tablet 100 mg  Status:  Discontinued     100 mg Oral Every 12 hours 04/26/18 1242 04/30/18 1401   04/25/18 1300  terbinafine (LAMISIL) tablet 250 mg     250 mg Oral Daily 04/25/18 1143       Subjective: Seen and Examined at bedside and states that he was having some excruciating mouth pain with dryness and feels like it has worsened since being hospitalized.  Also stated that he was shivering and had some chills but no fever.  Had some nausea earlier this morning.  No chest pain, lightheadedness or dizziness. Also complaining of difficulty having a bowel movement. No other concerns or complaints at this time.  Objective: Vitals:   05/06/18 2015 05/07/18 0033 05/07/18 0047 05/07/18 0437  BP: 115/65 (!) 119/58  115/60  Pulse: 65 63  64  Resp: 20 (!) 21 (!) 27 (!) 25  Temp: (!) 97.5 F (36.4 C) 97.8 F (36.6 C)  97.6 F (36.4 C)  TempSrc: Oral Oral  Oral  SpO2: 95% 97%  100%  Weight:   88.6 kg   Height:        Intake/Output Summary (Last 24 hours) at  05/07/2018 0827 Last data filed at 05/07/2018 5638 Gross per 24 hour  Intake 1104 ml  Output 5100 ml  Net -3996 ml   Filed Weights   05/04/18 0140 05/05/18 0411 05/07/18 0047  Weight: 92.2 kg 91 kg 88.6 kg   Examination: Physical Exam:  Constitutional: WN/WD overweight Caucasian male in mild distress and is actively shivering and complaining of significant mouth pain and dryness today.  Eyes: Sclerae anicteric. Lids and conjunctivae normal ENMT: External Ears and Nose appear Normal. Grossly normal hearing. Mouth with no visibile thrush but appeared a little dry Neck: Appears supple with no JVD Respiratory: Diminished auscultation bilaterally with nonlabored breathing.  No appreciable wheezing, rales, rhonchi.  Patient was not tachypneic or  using any accessory muscles to breathe Cardiovascular: The rate and rhythm.  Of the heart rate in the 60s.  Has a 2 out of 6 systolic murmur.  Has 2-3+ lower extremity edema Abdomen: Soft, nontender, slightly distended.  Bowel sounds present GU: Deferred Musculoskeletal: No contractures or cyanosis.  No joint deformities noted.  Has right PICC line in place Skin: Skin is warm and dry.  Has a right dorsal foot wound which is unwrapped and he has some granulation tissue.  Left leg erythema is stable and he still has a lot of swelling. Neurologic: Cranial nerves II through XII grossly intact no appreciable focal deficits Psychiatric: Depressed and has a slightly flat affect.  Intact judgment insight.  He is awake, alert and oriented x3  Data Reviewed: I have personally reviewed following labs and imaging studies  CBC: Recent Labs  Lab 05/03/18 0441 05/04/18 0500 05/05/18 0408 05/06/18 0438 05/07/18 0448  WBC 14.5* 14.2* 18.2* 14.7* 12.5*  NEUTROABS 11.3* 11.3* 14.9* 12.1* 10.1*  HGB 7.6* 7.6* 8.0* 7.8* 8.2*  HCT 25.7* 25.4* 26.8* 26.5* 26.6*  MCV 91.1 91.4 89.9 92.0 92.4  PLT 160 152 139* 145* 175*   Basic Metabolic Panel: Recent Labs  Lab  05/03/18 0441 05/04/18 0500 05/05/18 0408 05/06/18 0438 05/07/18 0448  NA 134* 131* 131* 133* 132*  K 3.7 2.9* 3.9 3.9 4.6  CL 94* 88* 89* 92* 89*  CO2 29 30 29 30  32  GLUCOSE 155* 192* 173* 213* 227*  BUN 43* 41* 43* 53* 45*  CREATININE 1.31* 1.32* 1.31* 1.26* 1.12  CALCIUM 8.5* 8.4* 8.7* 8.5* 8.7*  MG 2.5* 2.3 2.5* 2.3 2.2  PHOS 3.4 2.9 3.2 2.5 2.6   GFR: Estimated Creatinine Clearance: 57 mL/min (by C-G formula based on SCr of 1.12 mg/dL). Liver Function Tests: Recent Labs  Lab 05/02/18 0259 05/03/18 0441 05/04/18 0500 05/05/18 0408 05/07/18 0448  AST 31 27 28 27 24   ALT 44 39 33 29 25  ALKPHOS 125 132* 120 126 148*  BILITOT 0.8 1.0 0.8 0.8 0.5  PROT 5.1* 5.2* 5.0* 5.3* 5.2*  ALBUMIN 2.9* 3.0* 2.9* 2.9* 2.9*   No results for input(s): LIPASE, AMYLASE in the last 168 hours. No results for input(s): AMMONIA in the last 168 hours. Coagulation Profile: No results for input(s): INR, PROTIME in the last 168 hours. Cardiac Enzymes: No results for input(s): CKTOTAL, CKMB, CKMBINDEX, TROPONINI in the last 168 hours. BNP (last 3 results) No results for input(s): PROBNP in the last 8760 hours. HbA1C: No results for input(s): HGBA1C in the last 72 hours. CBG: No results for input(s): GLUCAP in the last 168 hours. Lipid Profile: No results for input(s): CHOL, HDL, LDLCALC, TRIG, CHOLHDL, LDLDIRECT in the last 72 hours. Thyroid Function Tests: No results for input(s): TSH, T4TOTAL, FREET4, T3FREE, THYROIDAB in the last 72 hours. Anemia Panel: No results for input(s): VITAMINB12, FOLATE, FERRITIN, TIBC, IRON, RETICCTPCT in the last 72 hours. Sepsis Labs: No results for input(s): PROCALCITON, LATICACIDVEN in the last 168 hours.  No results found for this or any previous visit (from the past 240 hour(s)). Radiology Studies: No results found.  Scheduled Meds: . allopurinol  100 mg Oral Daily  . amiodarone  200 mg Oral BID  . apixaban  5 mg Oral BID  . diclofenac sodium   2 g Topical QID  . docusate sodium  100 mg Oral Daily  . feeding supplement (ENSURE ENLIVE)  237 mL Oral BID BM  . iron polysaccharides  150  mg Oral Daily  . LORazepam  1 mg Oral QHS  . LORazepam  1 mg Oral Daily  . metolazone  2.5 mg Oral BID  . multivitamin with minerals  1 tablet Oral Daily  . potassium chloride  60 mEq Oral TID  . sertraline  200 mg Oral Daily  . sodium chloride flush  10-40 mL Intracatheter Q12H  . sodium chloride flush  3 mL Intravenous Q12H  . spironolactone  50 mg Oral BID  . terbinafine  250 mg Oral Daily   Continuous Infusions: . sodium chloride Stopped (05/03/18 1325)  . furosemide 120 mg (05/06/18 2014)  . milrinone 0.5 mcg/kg/min (05/07/18 0705)  . vancomycin 1,500 mg (05/06/18 1553)    LOS: 12 days   Kerney Elbe, DO [facility] PAGER is on [place]  If 7PM-7AM, please contact night-coverage [email or web address] Password [facility] 05/07/2018, 8:27 AM

## 2018-05-07 NOTE — Progress Notes (Signed)
Physical Therapy Treatment Patient Details Name: Antonio Carrillo MRN: [id number] DOB: 28-Aug-1940 Today's Date: 05/07/2018    History of Present Illness Antonio Carrillo is a 78 y.o. male with medical history significant of CLL, hypertension, hyperlipidemia, paroxysmal A. 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 Was started on a Milrinone gtt and getting diuresed with [facility] Team Following. During the hospitalization his Left Leg developed  new warmth and Erythema and ? Venous Stasis changes vs. Cellulitis but Cardiology started the patient on IV Vancomycin.    PT Comments    Good progression of mobility even with primary complaints of general fatigue today. Slow and steady gait pattern with min guard throughout for safety. Making good progress towards goals. Will continue to follow.     Follow Up Recommendations  Home health PT;Supervision/Assistance - 24 hour     Equipment Recommendations  None recommended by PT    Recommendations for Other Services       Precautions / Restrictions Precautions Precautions: Fall Restrictions Weight Bearing Restrictions: No    Mobility  Bed Mobility               General bed mobility comments: sitting EOB upon PT arrival  Transfers Overall transfer level: Needs assistance Equipment used: Rolling walker (2 wheeled) Transfers: Sit to/from Stand Sit to Stand: Min guard         General transfer comment: increased time and effort with heavy push off from bed rails(performed x3)  Ambulation/Gait Ambulation/Gait assistance: Min guard Gait Distance (Feet): 200 Feet Assistive device: Rolling walker (2 wheeled) Gait Pattern/deviations: Step-through pattern;Decreased stride length     General Gait Details: slow and steady  gait pattern with forward trunk flexion; at one time closing eyes; cueing for safety   Stairs             Wheelchair Mobility    Modified Rankin (Stroke Patients Only)       Balance Overall balance assessment: Needs assistance Sitting-balance support: No upper extremity supported;Feet supported Sitting balance-Leahy Scale: Fair     Standing balance support: Bilateral upper extremity supported;During functional activity Standing balance-Leahy Scale: Poor Standing balance comment: Use of Rw for external support                            Cognition Arousal/Alertness: Awake/alert Behavior During Therapy: WFL for tasks assessed/performed Overall Cognitive Status: Within Functional Limits for tasks assessed                                        Exercises      General Comments        Pertinent Vitals/Pain Pain Assessment: No/denies pain Pain Score: 0-No pain    Home Living                      Prior Function            PT Goals (current goals can now be found in the care plan section) Acute Rehab PT Goals Patient Stated Goal: to go home PT Goal Formulation: With patient Time For Goal Achievement: 05/18/18 Potential to Achieve Goals: Good Progress towards PT goals: Progressing toward  goals    Frequency    Min 3X/week      PT Plan Current plan remains appropriate    Co-evaluation              AM-PAC PT "6 Clicks" Mobility   Outcome Measure  Help needed turning from your back to your side while in a flat bed without using bedrails?: A Little Help needed moving from lying on your back to sitting on the side of a flat bed without using bedrails?: A Little Help needed moving to and from a bed to a chair (including a wheelchair)?: A Little Help needed standing up from a chair using your arms (e.g., wheelchair or bedside chair)?: A Little Help needed to walk in hospital room?: A Little Help needed climbing  3-5 steps with a railing? : A Little 6 Click Score: 18    End of Session Equipment Utilized During Treatment: Gait belt Activity Tolerance: Patient limited by fatigue Patient left: with call bell/phone within reach;in bed;with family/visitor present Nurse Communication: Mobility status PT Visit Diagnosis: Unsteadiness on feet (R26.81);Muscle weakness (generalized) (M62.81)     Time: 2595-6387 PT Time Calculation (min) (ACUTE ONLY): 16 min  Charges:  $Gait Training: 8-22 mins                      Lanney Gins, PT, DPT Supplemental Physical Therapist 05/07/18 3:43 PM Pager: [phone] Office: [phone]

## 2018-05-07 NOTE — Care Management Important Message (Signed)
Important Message  Patient Details  Name: Antonio Carrillo MRN: [id number] Date of Birth: [birth year]   Medicare Important Message Given:  Yes    Shellie Goettl P [place] Every 05/07/2018, 5:02 PM

## 2018-05-07 NOTE — Progress Notes (Signed)
Pt refused cpap for the night. RT will continue to monitor as needed. 

## 2018-05-07 NOTE — Progress Notes (Signed)
Daily Progress Note   Patient Name: Antonio Carrillo       Date: 05/07/2018 DOB: [birth year]  Age: 78 y.o. MRN#: [id number] Attending Physician: Kerney Elbe, DO Primary Care Physician: Patient, No Pcp Per Admit Date: 04/25/2018  Reason for Consultation/Follow-up: Establishing goals of care, Non pain symptom management and Pain control  Subjective: Patient awake, alert, oriented. He had a fairly well rested night with current symptom management regimen. He agrees that lower doses of ativan more frequently works better for him. He was able to walk with therapy yesterday.   Daughter, Antonio Carrillo at bedside. She asks if there is a therapist that can talk with her father. Explained that I am available to talk about plan of care/fears/concerns as well as chaplain. Patient declines chaplain services and tells me his rabbi is coming this afternoon. Antonio Carrillo may try to call his personal therapist and arrange a phone meeting. Reassured of continued support from PMT if he wishes to further discuss goals/plan of care.   Antonio Carrillo shares that he is anxious with the unknown of when milrinone is discontinued. 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   Antonio Carrillo has PMT contact information and understands she can call with questions or concerns.   Length of Stay: 12  Current Medications: Scheduled Meds:  . allopurinol  100 mg Oral Daily  . amiodarone  200 mg Oral BID  . apixaban  5 mg Oral BID  . diclofenac sodium  2 g Topical QID  . docusate sodium  100 mg Oral Daily  . feeding supplement (ENSURE ENLIVE)  237 mL Oral BID BM  . iron polysaccharides  150 mg Oral Daily  . LORazepam  0.5 mg Oral Daily  . LORazepam  1 mg Oral QHS  . metolazone  2.5 mg Oral BID  .  multivitamin with minerals  1 tablet Oral Daily  . potassium chloride  60 mEq Oral TID  . sertraline  200 mg Oral Daily  . sodium chloride flush  10-40 mL Intracatheter Q12H  . sodium chloride flush  3 mL Intravenous Q12H  . spironolactone  50 mg Oral BID  . terbinafine  250 mg Oral Daily    Continuous Infusions: . sodium chloride Stopped (05/03/18 1325)  . furosemide 120 mg (05/07/18 0834)  .  milrinone 0.5 mcg/kg/min (05/07/18 0705)  . vancomycin 1,500 mg (05/06/18 1553)    PRN Meds: sodium chloride, acetaminophen **OR** acetaminophen, benzocaine, bisacodyl, HYDROcodone-acetaminophen, lidocaine, LORazepam, magic mouthwash w/lidocaine, ondansetron **OR** ondansetron (ZOFRAN) IV, prochlorperazine, senna, sodium chloride flush, sodium chloride flush, white petrolatum  Physical Exam Vitals signs and nursing note reviewed.  Constitutional:      General: He is awake.  HENT:     Head: Normocephalic and atraumatic.  Cardiovascular:     Rate and Rhythm: Regular rhythm.  Pulmonary:     Effort: No tachypnea, accessory muscle usage or respiratory distress.  Abdominal:     Tenderness: There is no abdominal tenderness.  Skin:    General: Skin is warm and dry.     Coloration: Skin is pale.  Neurological:     Mental Status: He is alert and oriented to person, place, and time.  Psychiatric:        Mood and Affect: Mood normal.        Speech: Speech normal.        Behavior: Behavior is cooperative.        Cognition and Memory: Cognition normal.            Vital Signs: BP 115/60 (BP Location: Right Arm)   Pulse 64   Temp 97.6 F (36.4 C) (Oral)   Resp (!) 25   Ht 5\' 11"  (1.803 m)   Wt 88.6 kg   SpO2 100%   BMI 27.25 kg/m  SpO2: SpO2: 100 % O2 Device: O2 Device: Room Air O2 Flow Rate: O2 Flow Rate (L/min): 2 L/min  Intake/output summary:   Intake/Output Summary (Last 24 hours) at 05/07/2018 0851 Last data filed at 05/07/2018 0730 Gross per 24 hour  Intake 1344 ml  Output 5300  ml  Net -3956 ml   LBM: Last BM Date: 05/06/18 Baseline Weight: Weight: 101.3 kg Most recent weight: Weight: 88.6 kg       Palliative Assessment/Data: PPS 50%   Flowsheet Rows     Most Recent Value  Intake Tab  Referral Department  Cardiology  Unit at Time of Referral  Cardiac/Telemetry Unit  Palliative Care Primary Diagnosis  Cardiac  Date Notified  04/26/18  Palliative Care Type  New Palliative care  Reason for referral  Clarify Goals of Care  Date of Admission  04/25/18  Date first seen by Palliative Care  04/30/18  # of days Palliative referral response time  4 Day(s)  # of days IP prior to Palliative referral  1  Clinical Assessment  Psychosocial & Spiritual Assessment  Palliative Care Outcomes      Patient Active Problem List   Diagnosis Date Noted  . Anxiety state   . Chronic bilateral low back pain without sciatica   . Palliative care by specialist   . Right foot injury 04/26/2018  . Palliative care encounter   . Acute on chronic combined systolic and diastolic CHF (congestive heart failure) ([place]) 04/25/2018  . AKI (acute kidney injury) ([place]) 04/25/2018  . Hyperkalemia 04/25/2018  . Ascites 04/25/2018  . CLL (chronic lymphocytic leukemia) ([place]) 04/25/2018  . Anemia 04/25/2018  . CHF (congestive heart failure) ([place]) 04/25/2018  . 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 Coronary artery disease 07/28/2015  . Pericardial effusion 01/19/2015  . Cerebrovascular disease 01/19/2015  . Anticoagulants causing adverse effect in therapeutic use   . Hyperthyroidism   . Testicular failure   . Aortic aneurysm ([place])   . HTN (hypertension)   . Hyperlipidemia   .  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 PAD (peripheral artery disease) ([place]) 10/31/2010  . Long term (current) use of anticoagulants 06/22/2010  . SHORTNESS OF BREATH 12/28/2008  . CELLULITIS, MILD 12/11/2008  . HYPERLIPIDEMIA-MIXED 11/16/2008  . Aneurysm of thoracic aorta ([place]) 11/16/2008  .  HYPERTHYROIDISM 12/05/2006  . ATRIAL FIBRILLATION 12/05/2006    Palliative Care Assessment & Plan   Patient Profile: 78 y.o. male  with past medical history of CLL, HTN, HLD, RV failure, atrial fibrillation (on Eliquis), s/p multiple ablations, s/p CABG, severe right ventricular failure, recurrent ascites admitted on 04/25/2018 with leg swelling and weight gain 40 lbs x 1 month. Now inotrope dependent and continues with aggressive diuresis although renal function has improved.   Assessment: Acute on chronic combined CHF Hyponatremia Ascites Pedal edema PAF AKI Right foot wound/cellulitis Anxiety Depression Insomnia CLL  Recommendations/Plan:  Continue Vicodin 5-325mg  1 tab q6h prn moderate pain.   Continue scheduled Ativan 0.5mg  PO 1400 and Ativan 1mg  PO 2200. Will decrease prn ativan dose to 0.5mg  but allow patient to request every 6 hours as needed for anxiety/sleep.   Continue current plan of care per attending and HF team.   PMT will continue to follow.  Code Status: DNR   Code Status Orders  (From admission, onward)         Start     Ordered   05/01/18 1044  Do not attempt resuscitation (DNR)  Continuous    Question Answer Comment  In the event of cardiac or respiratory ARREST Do not call a "code blue"   In the event of cardiac or respiratory ARREST Do not perform Intubation, CPR, defibrillation or ACLS   In the event of cardiac or respiratory ARREST Use medication by any route, position, wound care, and other measures to relive pain and suffering. May use oxygen, suction and manual treatment of airway obstruction as needed for comfort.      05/01/18 1043        Code Status History    Date Active Date Inactive Code Status Order ID Comments User Context   04/25/2018 1143 05/01/2018 1043 Full Code [id number]  Barb Merino, MD Inpatient       Prognosis:   Poor long-term prognosis  Discharge Planning:  To Be Determined  Care plan was discussed with  patient, daughter, son, RN  Thank you for allowing the Palliative Medicine Team to assist in the care of this patient.   Time In: 0830 Time Out: 0845 Total Time 15 Prolonged Time Billed no      Greater than 50%  of this time was spent counseling and coordinating care related to the above assessment and plan.  Ihor Dow, FNP-C Palliative Medicine Team  Phone: [phone] Fax: [phone]  Please contact Palliative Medicine Team phone at [phone] for questions and concerns.

## 2018-05-08 ENCOUNTER — Inpatient Hospital Stay (HOSPITAL_COMMUNITY): Payer: Medicare Other

## 2018-05-08 ENCOUNTER — Encounter (HOSPITAL_COMMUNITY): Payer: Self-pay

## 2018-05-08 DIAGNOSIS — M7989 Other specified soft tissue disorders: Secondary | ICD-10-CM

## 2018-05-08 DIAGNOSIS — M79609 Pain in unspecified limb: Secondary | ICD-10-CM

## 2018-05-08 LAB — COOXEMETRY PANEL
Carboxyhemoglobin: 1.8 % — ABNORMAL HIGH (ref 0.5–1.5)
Methemoglobin: 1.8 % — ABNORMAL HIGH (ref 0.0–1.5)
O2 Saturation: 69.2 %
Total hemoglobin: 8.2 g/dL — ABNORMAL LOW (ref 12.0–16.0)

## 2018-05-08 LAB — MAGNESIUM: Magnesium: 2.2 mg/dL (ref 1.7–2.4)

## 2018-05-08 LAB — CBC WITH DIFFERENTIAL/PLATELET
Abs Immature Granulocytes: 0.09 10*3/uL — ABNORMAL HIGH (ref 0.00–0.07)
Basophils Absolute: 0 10*3/uL (ref 0.0–0.1)
Basophils Relative: 0 %
Eosinophils Absolute: 0.1 10*3/uL (ref 0.0–0.5)
Eosinophils Relative: 1 %
HCT: 26 % — ABNORMAL LOW (ref 39.0–52.0)
Hemoglobin: 7.9 g/dL — ABNORMAL LOW (ref 13.0–17.0)
Immature Granulocytes: 1 %
Lymphocytes Relative: 10 %
Lymphs Abs: 1.3 10*3/uL (ref 0.7–4.0)
MCH: 27.1 pg (ref 26.0–34.0)
MCHC: 30.4 g/dL (ref 30.0–36.0)
MCV: 89.3 fL (ref 80.0–100.0)
MONO ABS: 1.1 10*3/uL — AB (ref 0.1–1.0)
Monocytes Relative: 9 %
Neutro Abs: 9.9 10*3/uL — ABNORMAL HIGH (ref 1.7–7.7)
Neutrophils Relative %: 79 %
Platelets: 145 10*3/uL — ABNORMAL LOW (ref 150–400)
RBC: 2.91 MIL/uL — ABNORMAL LOW (ref 4.22–5.81)
RDW: 16.9 % — ABNORMAL HIGH (ref 11.5–15.5)
WBC: 12.4 10*3/uL — AB (ref 4.0–10.5)
nRBC: 0 % (ref 0.0–0.2)

## 2018-05-08 LAB — COMPREHENSIVE METABOLIC PANEL
ALT: 24 U/L (ref 0–44)
AST: 26 U/L (ref 15–41)
Albumin: 2.9 g/dL — ABNORMAL LOW (ref 3.5–5.0)
Alkaline Phosphatase: 150 U/L — ABNORMAL HIGH (ref 38–126)
Anion gap: 8 (ref 5–15)
BUN: 44 mg/dL — ABNORMAL HIGH (ref 8–23)
CO2: 34 mmol/L — ABNORMAL HIGH (ref 22–32)
Calcium: 8.8 mg/dL — ABNORMAL LOW (ref 8.9–10.3)
Chloride: 90 mmol/L — ABNORMAL LOW (ref 98–111)
Creatinine, Ser: 1.19 mg/dL (ref 0.61–1.24)
GFR calc Af Amer: >60 mL/min (ref >60)
GFR calc non Af Amer: 58 mL/min — ABNORMAL LOW (ref >60)
Glucose, Bld: 146 mg/dL — ABNORMAL HIGH (ref 70–99)
Potassium: 4.6 mmol/L (ref 3.5–5.1)
SODIUM: 132 mmol/L — AB (ref 135–145)
Total Bilirubin: 0.8 mg/dL (ref 0.3–1.2)
Total Protein: 5.4 g/dL — ABNORMAL LOW (ref 6.5–8.1)

## 2018-05-08 LAB — HEMOGLOBIN A1C
Hgb A1c MFr Bld: 6.1 % — ABNORMAL HIGH (ref 4.8–5.6)
Mean Plasma Glucose: 128.37 mg/dL

## 2018-05-08 LAB — PHOSPHORUS: Phosphorus: 2.7 mg/dL (ref 2.5–4.6)

## 2018-05-08 MED ORDER — IOHEXOL 300 MG/ML  SOLN
100.0000 mL | Freq: Once | INTRAMUSCULAR | Status: AC | PRN
  Administered 2018-05-08: 100 mL via INTRAVENOUS

## 2018-05-08 MED ORDER — LORAZEPAM 0.5 MG PO TABS: 0.5000 mg | ORAL_TABLET | Freq: Four times a day (QID) | ORAL | Status: DC

## 2018-05-08 MED ORDER — LORAZEPAM 0.5 MG PO TABS: 0.5000 mg | ORAL_TABLET | Freq: Every day | ORAL | Status: DC

## 2018-05-08 MED ORDER — MORPHINE SULFATE (PF) 2 MG/ML IV SOLN
2.0000 mg | Freq: Four times a day (QID) | INTRAVENOUS | Status: DC | PRN
  Administered 2018-05-08 – 2018-05-09 (×2): 2 mg via INTRAVENOUS
  Filled 2018-05-08 (×2): qty 1

## 2018-05-08 MED ORDER — VANCOMYCIN HCL 10 G IV SOLR
1500.0000 mg | INTRAVENOUS | Status: DC
  Administered 2018-05-08: 1500 mg via INTRAVENOUS
  Filled 2018-05-08: qty 1500

## 2018-05-08 MED ORDER — HYDROCODONE-ACETAMINOPHEN 5-325 MG PO TABS
1.0000 | ORAL_TABLET | Freq: Two times a day (BID) | ORAL | Status: DC
  Administered 2018-05-08 – 2018-05-09 (×2): 1 via ORAL
  Filled 2018-05-08 (×2): qty 1

## 2018-05-08 MED ORDER — HYDROCODONE-ACETAMINOPHEN 5-325 MG PO TABS
1.0000 | ORAL_TABLET | Freq: Two times a day (BID) | ORAL | Status: DC | PRN
  Administered 2018-05-08 – 2018-05-15 (×5): 1 via ORAL
  Filled 2018-05-08 (×6): qty 1

## 2018-05-08 MED ORDER — LORAZEPAM 0.5 MG PO TABS
0.5000 mg | ORAL_TABLET | Freq: Three times a day (TID) | ORAL | Status: DC
  Administered 2018-05-08 – 2018-05-15 (×20): 0.5 mg via ORAL
  Filled 2018-05-08 (×20): qty 1

## 2018-05-08 MED ORDER — LIDOCAINE HCL (PF) 1 % IJ SOLN
INTRAMUSCULAR | Status: AC
  Administered 2018-05-08: 19:00:00
  Filled 2018-05-08: qty 5

## 2018-05-08 NOTE — Progress Notes (Signed)
Palliative Medicine RN Note: Rec'd call from [facility]; Mr Grammatico will not qualify per [facility]. However, when SW offered choice, he requested [name], so [facility] approval is not relevant.  Marjie Skiff Anitha Kreiser, RN, BSN, [facility] Palliative Medicine Team 05/08/2018 10:20 AM Office [phone]

## 2018-05-08 NOTE — Progress Notes (Addendum)
Verbal order to hold eliquis and hold lasix per Dr. Haroldine Laws.  Fritz Pickerel, RN

## 2018-05-08 NOTE — Progress Notes (Signed)
Orthopaedic Trauma Progress Note  Was called to evaluate patient by Dr. Ree Kida and Dr. Haroldine Laws regarding a acute onset and lower extremity swelling.  CT scan and ultrasound were reviewed.  On the CT scan it showed a superficial collection on the surface of the anterior lateral portion of the lower leg.  It did not extend below the fascia.  The patient denied any area of swelling prior to today.  Denies any trauma to the area.  Has been on Eliquis and has also been on vancomycin.  He has not been running any fevers.  On physical exam the patient has a large anterior lateral swelling with significant fluctuance some skin changes consistent with pressure.  There is some warmth and redness however it appears to be more pressure related.  Patient is able to actively dorsiflex and plantarflex his toes without any pain on passive stretch.  I discussed with the patient that he likely had a hematoma and being on IV vancomycin there is little likelihood of infection.  After risks and benefits were discussed we decided on aspirating the area collection and concern.  I sterilely prepped the field injected a 1 mL of lidocaine into the area and used an 18-gauge needle to aspirate the fluid.  I have recovered 15 cc of serosanguineous fluid.  No signs of infection.  4 x 4 gauze and Ace wrap was placed to provide compression.  I will reevaluate the patient in the morning however I do not feel that there is any role from surgical intervention.  I would recommend compression stopping the Eliquis for at least 24 to 48 hours.  Shona Needles, MD [facility] [phone] (phone)

## 2018-05-08 NOTE — Progress Notes (Signed)
Assessment of CVP and management of it done by Charge R.N. Antonique

## 2018-05-08 NOTE — Progress Notes (Signed)
Pharmacy Antibiotic Note  Antonio Carrillo is a 77 y.o. male admitted on 04/25/2018 with cellulitis.  Pharmacy has been consulted for vancomycin dosing. Of note, pt is s/p 5-day course of doxycycline followed by 5 days vancomycin (last dose on 2/3).  Per notes, patient continues to have edema and pain. WBC 12.4, afebrile. Scr 1.19 (CrCl ~53 mL/min).   Plan: Restart vancomycin 1500mg  IV q24h Monitor LOT, renal function, cultures Vancomycin levels as needed  Height: 5\' 11"  (180.3 cm) Weight: 193 lb 1.6 oz (87.6 kg) IBW/kg (Calculated) : 75.3  Temp (24hrs), Avg:97.9 F (36.6 C), Min:97.7 F (36.5 C), Max:98.4 F (36.9 C)  Recent Labs  Lab 05/04/18 0500 05/05/18 0408 05/05/18 1558 05/06/18 0438 05/07/18 0448 05/08/18 0356  WBC 14.2* 18.2*  --  14.7* 12.5* 12.4*  CREATININE 1.32* 1.31*  --  1.26* 1.12 1.19  VANCOTROUGH  --   --  14*  --   --   --     Estimated Creatinine Clearance: 53.6 mL/min (by C-G formula based on SCr of 1.19 mg/dL).    No Known Allergies  Antimicrobials this admission: Doxycycline 1/26 >> 1/30 Vancomycin 1/30 >> 2/3, 2/5>>  Dose adjustments this admission: 2/2 VT: 14 - no changes  Microbiology results: 1/23 BCx: NGTD 1/23 Pleural fluid: no orgs  Thank you for allowing pharmacy to be a part of this patient's care.  Antonietta Jewel, PharmD, [facility] Clinical Pharmacist  Pager: [phone] Phone: [phone] Please check AMION for all [facility] numbers 05/08/2018

## 2018-05-08 NOTE — Progress Notes (Signed)
Daily Progress Note   Patient Name: Antonio Carrillo       Date: 05/08/2018 DOB: 05/14/1940  Age: 78 y.o. MRN#: [id number] Attending Physician: Cristal Ford, DO Primary Care Physician: Patient, No Pcp Per Admit Date: 04/25/2018  Reason for Consultation/Follow-up: Establishing goals of care, Non pain symptom management and Pain control  Subjective: Patient awake, alert, oriented. Sitting on side of the bed. Daughter, Apolonio Schneiders at bedside.   Apolonio Schneiders is requesting we make some changes in his symptom management medication. Patient states relief from prn ativan and norco but feels they are more effective and he is less groggy when they are spread out. Apolonio Schneiders is requesting we scatter doses of ativan and norco. Scheduled Ativan TID and Norco BID. Patient may request Norco BID prn for breakthrough pain. He had a bowel movement yesterday. He worked with therapy yesterday. His rabbi is coming this afternoon.   Answered questions and concerns. Apolonio Schneiders lives in [place] but plans to stay until the end of his hospitalization in order to help organize a plan for home when he is stable. Reassured of continued palliative support.   Length of Stay: 13  Current Medications: Scheduled Meds:  . allopurinol  100 mg Oral Daily  . amiodarone  200 mg Oral BID  . apixaban  5 mg Oral BID  . diclofenac sodium  2 g Topical QID  . feeding supplement (ENSURE ENLIVE)  237 mL Oral BID BM  . HYDROcodone-acetaminophen  1 tablet Oral BID  . iron polysaccharides  150 mg Oral Daily  . LORazepam  0.5 mg Oral TID  . metolazone  2.5 mg Oral BID  . multivitamin with minerals  1 tablet Oral Daily  . polyethylene glycol  17 g Oral BID  . potassium chloride  60 mEq Oral BID  . senna-docusate  1 tablet Oral BID  . sertraline   200 mg Oral Daily  . sodium chloride flush  10-40 mL Intracatheter Q12H  . sodium chloride flush  3 mL Intravenous Q12H  . spironolactone  50 mg Oral BID  . terbinafine  250 mg Oral Daily    Continuous Infusions: . sodium chloride Stopped (05/03/18 1325)  . furosemide 120 mg (05/08/18 0802)  . milrinone 0.5 mcg/kg/min (05/08/18 0328)    PRN Meds: sodium chloride, benzocaine, bisacodyl, HYDROcodone-acetaminophen, lidocaine, magic mouthwash  w/lidocaine, ondansetron **OR** ondansetron (ZOFRAN) IV, prochlorperazine, sodium chloride flush, sodium chloride flush, white petrolatum  Physical Exam Vitals signs and nursing note reviewed.  Constitutional:      General: He is awake.  HENT:     Head: Normocephalic and atraumatic.  Cardiovascular:     Rate and Rhythm: Regular rhythm.  Pulmonary:     Effort: No tachypnea, accessory muscle usage or respiratory distress.  Abdominal:     Tenderness: There is no abdominal tenderness.  Skin:    General: Skin is warm and dry.     Coloration: Skin is pale.  Neurological:     Mental Status: He is alert and oriented to person, place, and time.  Psychiatric:        Mood and Affect: Mood normal.        Speech: Speech normal.        Behavior: Behavior is cooperative.        Cognition and Memory: Cognition normal.            Vital Signs: BP (!) 113/56 (BP Location: Left Arm)   Pulse 98   Temp 97.9 F (36.6 C) (Oral)   Resp 16   Ht 5\' 11"  (1.803 m)   Wt 87.6 kg   SpO2 94%   BMI 26.93 kg/m  SpO2: SpO2: 94 % O2 Device: O2 Device: Room Air O2 Flow Rate: O2 Flow Rate (L/min): 2 L/min  Intake/output summary:   Intake/Output Summary (Last 24 hours) at 05/08/2018 2500 Last data filed at 05/08/2018 3704 Gross per 24 hour  Intake 1839.96 ml  Output 3425 ml  Net -1585.04 ml   LBM: Last BM Date: 05/08/18 Baseline Weight: Weight: 101.3 kg Most recent weight: Weight: 87.6 kg       Palliative Assessment/Data: PPS 50%   Flowsheet Rows      Most Recent Value  Intake Tab  Referral Department  Cardiology  Unit at Time of Referral  Cardiac/Telemetry Unit  Palliative Care Primary Diagnosis  Cardiac  Date Notified  04/26/18  Palliative Care Type  New Palliative care  Reason for referral  Clarify Goals of Care  Date of Admission  04/25/18  Date first seen by Palliative Care  04/30/18  # of days Palliative referral response time  4 Day(s)  # of days IP prior to Palliative referral  1  Clinical Assessment  Psychosocial & Spiritual Assessment  Palliative Care Outcomes      Patient Active Problem List   Diagnosis Date Noted  . Anxiety state   . Chronic bilateral low back pain without sciatica   . Palliative care by specialist   . Right foot injury 04/26/2018  . Palliative care encounter   . Acute on chronic combined systolic and diastolic CHF (congestive heart failure) ([place]) 04/25/2018  . AKI (acute kidney injury) ([place]) 04/25/2018  . Hyperkalemia 04/25/2018  . Ascites 04/25/2018  . CLL (chronic lymphocytic leukemia) ([place]) 04/25/2018  . Anemia 04/25/2018  . CHF (congestive heart failure) ([place]) 04/25/2018  . 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 Coronary artery disease 07/28/2015  . Pericardial effusion 01/19/2015  . Cerebrovascular disease 01/19/2015  . Anticoagulants causing adverse effect in therapeutic use   . Hyperthyroidism   . Testicular failure   . Aortic aneurysm ([place])   . HTN (hypertension)   . Hyperlipidemia   . 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 PAD (peripheral artery disease) ([place]) 10/31/2010  . Long term (current) use of anticoagulants 06/22/2010  . SHORTNESS OF BREATH 12/28/2008  .  CELLULITIS, MILD 12/11/2008  . HYPERLIPIDEMIA-MIXED 11/16/2008  . Aneurysm of thoracic aorta ([place]) 11/16/2008  . HYPERTHYROIDISM 12/05/2006  . ATRIAL FIBRILLATION 12/05/2006    Palliative Care Assessment & Plan   Patient Profile: 78 y.o. male  with past medical history of CLL, HTN, HLD, RV failure, atrial  fibrillation (on Eliquis), s/p multiple ablations, s/p CABG, severe right ventricular failure, recurrent ascites admitted on 04/25/2018 with leg swelling and weight gain 40 lbs x 1 month. Now inotrope dependent and continues with aggressive diuresis although renal function has improved.   Assessment: Acute on chronic combined CHF Hyponatremia Ascites Pedal edema PAF AKI Right foot wound/cellulitis Anxiety Depression Insomnia CLL  Recommendations/Plan:  Discussed symptom management medications in detail with patient and daughter. Patient wishes for ativan and norco to be given at separate times because they are more effective and he is not as groggy. Scheduled ativan 1200, 2000, 0400. Scheduled Norco 1600, 0000.   Continue scheduled Miralax and Senna  Continue prn magic mouthwash.   Continue current plan of care per attending and cardiology.   PMT will follow.   Code Status: DNR   Code Status Orders  (From admission, onward)         Start     Ordered   05/01/18 1044  Do not attempt resuscitation (DNR)  Continuous    Question Answer Comment  In the event of cardiac or respiratory ARREST Do not call a "code blue"   In the event of cardiac or respiratory ARREST Do not perform Intubation, CPR, defibrillation or ACLS   In the event of cardiac or respiratory ARREST Use medication by any route, position, wound care, and other measures to relive pain and suffering. May use oxygen, suction and manual treatment of airway obstruction as needed for comfort.      05/01/18 1043        Code Status History    Date Active Date Inactive Code Status Order ID Comments User Context   04/25/2018 1143 05/01/2018 1043 Full Code [id number]  Barb Merino, MD Inpatient       Prognosis:   Poor long-term prognosis  Discharge Planning:  To Be Determined  Care plan was discussed with patient, daughter,  Thank you for allowing the Palliative Medicine Team to assist in the care of this  patient.   Time In: 0840 Time Out: 0910 Total Time 30 Prolonged Time Billed no      Greater than 50%  of this time was spent counseling and coordinating care related to the above assessment and plan.  Ihor Dow, FNP-C Palliative Medicine Team  Phone: [phone] Fax: [phone]  Please contact Palliative Medicine Team phone at [phone] for questions and concerns.

## 2018-05-08 NOTE — Consult Note (Signed)
WOC consulted for new onset LLE pain. Discussed with bedside nurse. I saw this patient last on 05/02/18 at which time the patient had left lateral leg redness but no other acute symptoms. No open wounds. The Unna's boot had been ordered by CHF team.   Today bedside nurse reports she will have CHF team assess and that hospitalist is ordering doppler.   No need for topical care for this leg.  Discussed POC with patient and bedside nurse.  Re consult if needed, will not follow at this time. Thanks  Stanislav Gervase [employer], RN,CWOCN, CNS, [place] [phone])

## 2018-05-08 NOTE — Progress Notes (Signed)
PROGRESS NOTE    Antonio Carrillo  XTK:[id number] DOB: 1940-07-11 DOA: 04/25/2018 PCP: Patient, No Pcp Per   Brief Narrative:  HPI On 04/25/2018 by Dr. Barb Merino Antonio Carrillo is a 78 y.o. male with medical history significant of CLL, hypertension, hyperlipidemia, paroxysmal A. 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  Patient has extensive medical problems.  He has history of atrial fibrillation with multiple ablations, aortic root replacement with CABG, right ventricular heart failure and CLL.  Patient has been going through multiple diuretics to help with his leg swellings.  According to the patient, it was manageable until about a month now.  Patient stated that his legs are huge and edematous, they were skinny before Christmas.  Patient gained about 40 pounds of weight since 1 month.  He had some ascites in the past, however since last 1 month he had to have it tapped 3 times.  Patient also has persistent nausea but no vomiting.  Patient also has pain due to tightness of the legs.  He also injured his right leg at home and had to have sutures placed which are draining liquid around the suture lines.  Patient denies any chest pain or palpitation.  He is short of breath on exertion but none at rest.  He denies orthopnea or paroxysmal nocturnal dyspnea.  Patient recently on Bumex.  He went to see cardiologist today in the office, due to significant symptoms while sent to inpatient admission. Patient examined at the bedside.  He was on room air.  His blood pressures are stable.  WBC count is elevated, however chronic.  His potassium was 5.5, he is on Aldactone and potassium supplement.  His hemoglobin is more or less at baseline.  Creatinine is 2.69 which is significantly above his baseline of 1.4.  Interim history Admitted  with CHF exacerbation, cardiology/HF team currently consulted and following. Assessment & Plan   Acute on chronic combined systolic and diastolic heart failure  -with hyponatremia, ascites and pedal edema -Cardiology and palliative care consulted and appreciated -Patient continues to be on IV Lasix -Was placed on a milrinone drip and currently weaning per cardiology recommendations -Monitor intake and output, daily weights -Echocardiogram 04/25/2018 showed normal LV systolic function, mild LVE, mild AI, mild  MR, severe biatrial enlargement, mild RVE with moderate RV dysfunction, severe TR  Paroxysmal atrial fibrillation -Patient has had multiple ablations and MAC E in the past -Cardiology currently following -Continue amiodarone, Eliquis  Acute kidney injury -On admission, creatinine was 2.5, baseline less than 1 -Creatinine currently 1.19 -With continue to monitor BMP  Hypokalemia -Likely secondary to diuresis -Resolved -Continue to monitor BMP  Right foot wound/dorsal laceration with leukocytosis-left lower extremity edema -was seen by orthopedics at [facility] on 04/17/2018 and given Keflex -On 04/26/2018-  Patient noted to have swelling with weeping profusely and pedal edema -Orthopedic surgery consulted and appreciated, Dr. Doreatha Martin remove sutures and recommended antibiotics with doxycycline which is now been discontinued -Wound care consulted -Continue wet-to-dry dressings -Patient with continued erythema and swelling-cardiology started vancomycin on 05/02/2018 -Given continued edema, and pain, Doppler ordered for left lower extremity  Anxiety/depression -Continue Zoloft, Ativan  Chronic anemia/iron deficiency anemia -continue to monitor CBC.  Avoiding blood transfusions to avoid further volume overload  CLL -Diagnosed in 2015 and followed at [facility] note from 04/01/2018 recommends  observation only after his treatment and repeat CT in 3  months  Painful mouth and throat -Has been a chronic issue for several years and patient has been undergoing treatment for CLL -Continue Magic mouthwash with lidocaine 4 times daily -Consider outpatient ENT or GI evaluations -No evidence of thrush  OSA -Continue CPAP nightly  Gout -Continue allopurinol  Left knee pain with arthritis -Continue Voltaren gel  Elevated TSH -Likely sick euthyroid, free T4 normal  Constipation -Continue bowel regimen  Abnormal LFTs -Resolved  Hyponatremia -Continue to monitor, currently 132  Thrombocytopenia -stable, continue to monitor   Hyperglycemia -Hemoglobin A1c 6.1, continue insulin sliding scale, CBG monitoring  DVT Prophylaxis  Eliquis  Code Status:DNR  Family Communication: Family at bedside  Disposition Plan: Admitted.   Consultants Cardiology Palliative care Orthopedic surgery  Procedures  PICC line placement 04/25/2018 Suture removal 04/26/2018  Antibiotics   Anti-infectives (From admission, onward)   Start     Dose/Rate Route Frequency Ordered Stop   05/02/18 1400  vancomycin (VANCOCIN) 1,500 mg in sodium chloride 0.9 % 500 mL IVPB  Status:  Discontinued     1,500 mg 250 mL/hr over 120 Minutes Intravenous Every 24 hours 05/02/18 1232 05/07/18 0854   04/30/18 2200  doxycycline (VIBRA-TABS) tablet 100 mg     100 mg Oral Every 12 hours 04/30/18 1401 05/01/18 0808   04/26/18 1330  doxycycline (VIBRA-TABS) tablet 100 mg  Status:  Discontinued     100 mg Oral Every 12 hours 04/26/18 1242 04/30/18 1401   04/25/18 1300  terbinafine (LAMISIL) tablet 250 mg     250 mg Oral Daily 04/25/18 1143        Subjective:   Antonio Carrillo seen and examined today.  Complaints this morning.  However was called later by the nurse telling me the patient was having excruciating left leg pain.  Currently denies chest pain, feels breathing has improved.  Denies abdominal pain, nausea or vomiting.  Objective:   Vitals:   05/08/18 0331  05/08/18 0339 05/08/18 0747 05/08/18 1204  BP: 105/72  (!) 113/56 115/67  Pulse: 67  98 75  Resp: 17 (!) _0 Temp: 97.7 F (36.5 C)  97.9 F (36.6 C) 98.4 F (36.9 C)  TempSrc: Oral  Oral Axillary  SpO2: 91%  94% 96%  Weight:  87.6 kg    Height:        Intake/Output Summary (Last 24 hours) at 05/08/2018 1528 Last data filed at 05/08/2018 1511 Gross per 24 hour  Intake 1839.96 ml  Output 4300 ml  Net -2460.04 ml   Filed Weights   05/05/18 0411 05/07/18 0047 05/08/18 0339  Weight: 91 kg 88.6 kg 87.6 kg    Exam  General: Well developed, chronically ill-appearing, NAD  HEENT: NCAT, mucous membranes moist.   Neck: Supple  Cardiovascular: S1 S2 auscultated, RRR, 2/6 SEM  Respiratory: Clear to auscultation bilaterally with equal chest rise  Abdomen: Soft, nontender, nondistended, + bowel sounds  Extremities: warm dry without cyanosis clubbing.  TED hose LLE.  Unna boot/dressing RLE.  Neuro: AAOx3, nonfocal  Psych: Appropriate mood and affect   Data Reviewed: I have personally reviewed following labs and imaging studies  CBC: Recent Labs  Lab 05/04/18 0500 05/05/18 0408 05/06/18 0438 05/07/18 0448 05/08/18 0356  WBC 14.2* 18.2* 14.7* 12.5* 12.4*  NEUTROABS 11.3* 14.9* 12.1* 10.1* 9.9*  HGB 7.6* 8.0* 7.8* 8.2* 7.9*  HCT 25.4* 26.8* 26.5* 26.6* 26.0*  MCV 91.4 89.9 92.0 92.4 89.3  PLT 152 139* 145* 146* 229*   Basic Metabolic Panel: Recent Labs  Lab 05/04/18 0500 05/05/18 0408 05/06/18 0438 05/07/18 0448 05/08/18 0356  NA 131* 131* 133* 132* 132*  K 2.9* 3.9 3.9 4.6 4.6  CL 88* 89* 92* 89* 90*  CO2 _0 32 34*  GLUCOSE 192* 173* 213* 227* 146*  BUN 41* 43* 53* 45* 44*  CREATININE 1.32* 1.31* 1.26* 1.12 1.19  CALCIUM 8.4* 8.7* 8.5* 8.7* 8.8*  MG 2.3 2.5* 2.3 2.2 2.2  PHOS 2.9 3.2 2.5 2.6 2.7   GFR: Estimated Creatinine Clearance: 53.6 mL/min (by C-G formula based on SCr of 1.19 mg/dL). Liver Function Tests: Recent Labs  Lab  05/03/18 0441 05/04/18 0500 05/05/18 0408 05/07/18 0448 05/08/18 0356  AST _1 ALT 39 33 _2 ALKPHOS 132* 120 126 148* 150*  BILITOT 1.0 0.8 0.8 0.5 0.8  PROT 5.2* 5.0* 5.3* 5.2* 5.4*  ALBUMIN 3.0* 2.9* 2.9* 2.9* 2.9*   No results for input(s): LIPASE, AMYLASE in the last 168 hours. No results for input(s): AMMONIA in the last 168 hours. Coagulation Profile: No results for input(s): INR, PROTIME in the last 168 hours. Cardiac Enzymes: No results for input(s): CKTOTAL, CKMB, CKMBINDEX, TROPONINI in the last 168 hours. BNP (last 3 results) No results for input(s): PROBNP in the last 8760 hours. HbA1C: Recent Labs    05/08/18 0356  HGBA1C 6.1*   CBG: No results for input(s): GLUCAP in the last 168 hours. Lipid Profile: No results for input(s): CHOL, HDL, LDLCALC, TRIG, CHOLHDL, LDLDIRECT in the last 72 hours. Thyroid Function Tests: No results for input(s): TSH, T4TOTAL, FREET4, T3FREE, THYROIDAB in the last 72 hours. Anemia Panel: No results for input(s): VITAMINB12, FOLATE, FERRITIN, TIBC, IRON, RETICCTPCT in the last 72 hours. Urine analysis: No results found for: COLORURINE, APPEARANCEUR, LABSPEC, PHURINE, GLUCOSEU, HGBUR, BILIRUBINUR, KETONESUR, PROTEINUR, UROBILINOGEN, NITRITE, LEUKOCYTESUR Sepsis Labs: _3 (procalcitonin:4,lacticidven:4)  )No results found for this or any previous visit (from the past 240 hour(s)).    Radiology Studies: No results found.   Scheduled Meds: . allopurinol  100 mg Oral Daily  . amiodarone  200 mg Oral BID  . apixaban  5 mg Oral BID  . diclofenac sodium  2 g Topical QID  . feeding supplement (ENSURE ENLIVE)  237 mL Oral BID BM  . HYDROcodone-acetaminophen  1 tablet Oral BID  . iron polysaccharides  150 mg Oral Daily  . LORazepam  0.5 mg Oral TID  . metolazone  2.5 mg Oral BID  . multivitamin with minerals  1 tablet Oral Daily  . polyethylene glycol  17 g Oral BID  . potassium chloride  60 mEq Oral BID   . senna-docusate  1 tablet Oral BID  . sertraline  200 mg Oral Daily  . sodium chloride flush  10-40 mL Intracatheter Q12H  . sodium chloride flush  3 mL Intravenous Q12H  . spironolactone  50 mg Oral BID  . terbinafine  250 mg Oral Daily   Continuous Infusions: . sodium chloride Stopped (05/03/18 1325)  . furosemide 120 mg (05/08/18 0802)  . milrinone 0.5 mcg/kg/min (05/08/18 1045)     LOS: 13 days   Time Spent in minutes   45 minutes  Shellee Streng D.O. on 05/08/2018 at 3:28 PM  Between 7am to 7pm - Please see pager noted on [email or web address]  After 7pm go to [email or web address]  And look for the night coverage person covering for me after hours  [place]  [phone]

## 2018-05-08 NOTE — Progress Notes (Signed)
RT Note:  Asked patient at what time he wanted to go on his CPAP.  Patient shook his head and refused to go on for tonight.

## 2018-05-08 NOTE — Plan of Care (Signed)
  Problem: Cardiac: Goal: Ability to achieve and maintain adequate cardiopulmonary perfusion will improve Outcome: Progressing   Problem: Clinical Measurements: Goal: Ability to maintain clinical measurements within normal limits will improve Outcome: Progressing

## 2018-05-08 NOTE — Progress Notes (Signed)
Offered Vicodin with pt's morning medications per care order. Pt politely declines at this time.   Fritz Pickerel, RN

## 2018-05-08 NOTE — Progress Notes (Signed)
Left lower extremity venous duplex completed. Refer to "CV Proc" under chart review to view preliminary results.  05/08/2018 4:06 PM Maudry Mayhew, MHA, RVT, RDCS, RDMS

## 2018-05-08 NOTE — Progress Notes (Addendum)
Pt complaining of acute pain to LLE around 1300. Ted hose removee per pt request.  L calf with asymmetrical swelling just below the knee on the lateral portion of the leg. 1325 PRN Vicodin given without relief.   Dr. Ree Kida, Oda Kilts, and [place] paged around 1300 per pt request. Dr. Ree Kida ordered LLE duplex, WOC consult, and IV vancomycin at 1339. Pt and wife updated as to plan of care.   HF team paged around 1400 per pt and pt's wife request. Spoke with Oda Kilts and informed of acute onset pain per pt request, updated HF team as to plan for LLE duplex.  Spoke with [place] RN, please see Melody Liane Comber RN's note from [phone]. No topical care of leg needed.   Wife continues to come to desk to ask questions regarding plan of care, asks to see Dr. Haroldine Laws. 1500 paged HF team again, spoke with Jonni Sanger a second time - Dr. Haroldine Laws is in clinic and will see pt when able. Given verbal order to change LLE duplex to stat.   1525 scheduled Vicodin given.   Spoke with Oda Kilts a second time - Dr. Haroldine Laws is in clinic and will see pt when able. Given verbal order to change LLE duplex to stat. 1606 LLE duplex negative for DVT.   Spoke with Dr. Ree Kida. Given verbal order for soft tissue ultrasound at 1615.   Dr. Haroldine Laws at bedside, given verbal order for CT LLE with contrast at 1650. Spoke with CT at 1655.  Personally transported pt down to CT due to transport being backed up. CT completed at 1734.   Pt complaint of burning pain down to the LLE to the toes, Dr. Ree Kida paged and given order for morphine at 1755.   Dr. Judee Clara arrived to bedside. At 1825 after administration of lidocaine he aspirated approx 15cc of blood from the L lateral calf. Pressure dressing applied.   Dr. Judee Clara to see again in the am, NPO at midnight in case of additional needed intervention per Dr. Ree Kida.  2002 Spoke with Dr. Haroldine Laws - verbal order to discontinue spironolactone, lasix, metolazone, potassium, and  eliquis.   Pt, daughter, and pt's wife updated as to plan of care. All questions answered.   Fritz Pickerel, RN

## 2018-05-08 NOTE — Progress Notes (Addendum)
Patient ID: Antonio Carrillo, male   DOB: [birth year], 78 y.o.   MRN: [id number]     Advanced Heart Failure Rounding Note  PCP-Cardiologist: No primary care provider on file.   Subjective:    Admitted from clinic 04/25/2018 with marked volume overloaded and abdominal distention.   Coox 69.2% on milrinone 0.5. Negative 1.6L L on Lasix 120 mg IV TID, spiro 50 mg BID, and metolazone 2.5 mg BID. K 4.6. Cr stable at 1.19. CVP 11-12 on my personal check, lying in bed with feet propped up.   Feeling weaker this am, states due to "prolonged hospitalization." Denies SOB. Leg swelling improving. Mouth pain somewhat improved with increased frequency of magic mouthwash.    Echo 04/25/2018: LV EF 55-60%, D-shaped interventricular septum with RV enlargement and dysfunction, severe TR.   Objective:   Weight Range: 87.6 kg Body mass index is 26.93 kg/m.   Vital Signs:   Temp:  [97.7 F (36.5 C)-98.5 F (36.9 C)] 97.9 F (36.6 C) (02/05 0747) Pulse Rate:  [67-99] 98 (02/05 0747) Resp:  [13-26] 16 (02/05 0747) BP: (105-141)/(56-85) 113/56 (02/05 0747) SpO2:  [91 %-98 %] 94 % (02/05 0747) Weight:  [87.6 kg] 87.6 kg (02/05 0339) Last BM Date: 05/08/18  Weight change: Filed Weights   05/05/18 0411 05/07/18 0047 05/08/18 0339  Weight: 91 kg 88.6 kg 87.6 kg   Intake/Output:   Intake/Output Summary (Last 24 hours) at 05/08/2018 0818 Last data filed at 05/08/2018 0642 Gross per 24 hour  Intake 1839.96 ml  Output 3425 ml  Net -1585.04 ml    Physical Exam   CVP 11-12 lying down.  General: NAD HEENT: Normal Neck: Supple. JVP 11-12 cm. Carotids 2+ bilat; no bruits. No thyromegaly or nodule noted. Cor: PMI nondisplaced. RRR, 2/6 TR, + RV lift. Lungs: Mild basilar crackles.  Abdomen: Soft, non-tender, + distended, no HSM. No bruits or masses. +BS  Extremities: No cyanosis, clubbing, or rash. R and LLE no edema.  Neuro: Alert & orientedx3, cranial nerves grossly intact. moves all 4 extremities w/o  difficulty. Affect pleasant  GI: + glossitis  Telemetry   NSR 90s, personally reviewed.   Labs    CBC Recent Labs    05/07/18 0448 05/08/18 0356  WBC 12.5* 12.4*  NEUTROABS 10.1* 9.9*  HGB 8.2* 7.9*  HCT 26.6* 26.0*  MCV 92.4 89.3  PLT 146* 378*   Basic Metabolic Panel Recent Labs    05/07/18 0448 05/08/18 0356  NA 132* 132*  K 4.6 4.6  CL 89* 90*  CO2 32 34*  GLUCOSE 227* 146*  BUN 45* 44*  CREATININE 1.12 1.19  CALCIUM 8.7* 8.8*  MG 2.2 2.2  PHOS 2.6 2.7   Liver Function Tests Recent Labs    05/07/18 0448 05/08/18 0356  AST 24 26  ALT 25 24  ALKPHOS 148* 150*  BILITOT 0.5 0.8  PROT 5.2* 5.4*  ALBUMIN 2.9* 2.9*   No results for input(s): LIPASE, AMYLASE in the last 72 hours. Cardiac Enzymes No results for input(s): CKTOTAL, CKMB, CKMBINDEX, TROPONINI in the last 72 hours.  BNP: BNP (last 3 results) Recent Labs    04/25/18 0949  BNP 1,544.3*    ProBNP (last 3 results) No results for input(s): PROBNP in the last 8760 hours.   D-Dimer No results for input(s): DDIMER in the last 72 hours. Hemoglobin A1C Recent Labs    05/08/18 0356  HGBA1C 6.1*   Fasting Lipid Panel No results for input(s): CHOL, HDL, LDLCALC, TRIG, CHOLHDL,  LDLDIRECT in the last 72 hours. Thyroid Function Tests No results for input(s): TSH, T4TOTAL, T3FREE, THYROIDAB in the last 72 hours.  Invalid input(s): FREET3  Other results:   Imaging    No results found.   Medications:     Scheduled Medications: . allopurinol  100 mg Oral Daily  . amiodarone  200 mg Oral BID  . apixaban  5 mg Oral BID  . diclofenac sodium  2 g Topical QID  . feeding supplement (ENSURE ENLIVE)  237 mL Oral BID BM  . iron polysaccharides  150 mg Oral Daily  . LORazepam  0.5 mg Oral Daily  . LORazepam  1 mg Oral QHS  . metolazone  2.5 mg Oral BID  . multivitamin with minerals  1 tablet Oral Daily  . polyethylene glycol  17 g Oral BID  . potassium chloride  60 mEq Oral BID  .  senna-docusate  1 tablet Oral BID  . sertraline  200 mg Oral Daily  . sodium chloride flush  10-40 mL Intracatheter Q12H  . sodium chloride flush  3 mL Intravenous Q12H  . spironolactone  50 mg Oral BID  . terbinafine  250 mg Oral Daily    Infusions: . sodium chloride Stopped (05/03/18 1325)  . furosemide 120 mg (05/08/18 0802)  . milrinone 0.5 mcg/kg/min (05/08/18 0328)    PRN Medications: sodium chloride, acetaminophen **OR** acetaminophen, benzocaine, bisacodyl, HYDROcodone-acetaminophen, lidocaine, LORazepam, magic mouthwash w/lidocaine, ondansetron **OR** ondansetron (ZOFRAN) IV, prochlorperazine, sodium chloride flush, sodium chloride flush, white petrolatum  Patient Profile   Antonio Carrillo is a 78 y.o. male with h/o AF s/p several ablations, aortic root aneurysm, s/p aortic root replacement with CABG 1, RV failure, CLL, and ascites.   Sent for admission from HF clinic 04/25/2018 with marked volume overload on exam, RLE wound, and marked ascites.   Assessment/Plan   1. 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 cMRI 4/18 at [facility] with normal LV function. RV severely dilated and mild RV dysfunction.  Echo this admission with LV EF 55-60%, RV dilated and dysfunctional, severe TR, D-shaped septum.  - Co-ox 69% on milrinone 0.5 mcg/kg/min. Will attempt wean once fully diuresed. - Weight down 2 lbs. 30 lbs total.  - Continue 120 mg IV lasix tid + metolazone 2.5 mg twice a day at least this am.  - Continue spiro 50 mg BID to help limit hypokalemia - Long-term prognosis is quite poor.  With RV failure, not candidate for LVAD and also would be poor HD candidate. Palliative Care following. He has been resistant to [facility] discussions. He is hopeful that he may get some recovery. He was very satisfied with his QOL recently and would like to get back to that. We will continue inotrope support and diuresis.  2. PAF: S/p multiple  ablations and MAZE.  - Remains in NSR.   - Continue amiodarone 200 mg BID while on milrinone.   - Now off digoxin. Creatinine improving maybe able to restart soon.  - Continue Eliquis. No bleeding issues. Hgb 7.9. Plan as below.  3. CLL: Per Oncology.  Now s/p pelvic XRT. No change. 4. CAD and CABG x 1:  - No s/s of ischemia.    - No ASA given apixaban use.     5. Aortic root aneurysm s/p repair  6. RLE laceration: Appreciate WOC and Ortho input. No fever or chills.  - Management per Ortho. Appreciate their input. No  change.   7. Iron deficiency Anemia: No overt bleeding. Remains on apixaban.   - With marked volume overload, would not transfuse until Hgb < 7.  Hgb 7.9 - Transferrin saturation low.  - Received feraheme 11/25.  8. Ascites: Persists.  S/p 2 paracenteses recently with 4.1 and 1.3 L out. 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 Cytology from paracentesis has repeatedly been negative for malignancy - Consider repeat paracentesis as needed. Stable. 9. Hyponatremia:  - Sodium dropped to 119.. Improved after a couple doses of  tolvaptan.  - Sodium stable at 132. - Continue free water restrict.  10. Hypokalemia - Potassium stabilized now. Continue spiro 50 mg BID - Continue K 60 meq TID.  - Continue spiro 50 mg BID 11. Hypotension - Stable. - Consider midodrine as needed 12. LLE cellulitis  - Improving. Finished Vancomycin.   13. ? 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  Shirley Friar, PA-C  05/08/2018 8:18 AM   Advanced Heart Failure Team Pager [phone] (M-F; 7a - 4p)  Please contact [facility] for night-coverage after hours (4p -7a ) and weekends on [email or web address]    Patient seen and examined with the above-signed Advanced Practice Provider and/or Housestaff. I personally reviewed laboratory data, imaging studies and relevant notes. I independently examined the patient and  formulated the important aspects of the plan. I have edited the note to reflect any of my changes or salient points. I have personally discussed the plan with the patient and/or family.  He has developed tense, painful and erythematous swelling of LLE. U/s negative for DVT. We have ordered urgent CT scan of LLE which is suggestive of acute hematoma. I spoke with Dr. Doreatha Martin personally who was able to come in this evening (thank you) and evaluate the leg and performed aspiration which confirmed hematoma. The leg is now wrapped and he is much more comfortable after aspiration and compression. Will hold Eliquis for several days.   From HF perspective weight is down another 3 pounds and he is getting close to euvolemic. With acute LE bleed will hold lasix, metolazone and spiro overnight to help ensure clinical stability. Can resume diuresis in am, if needed. Possible slow wean of milrinone starting tomorrow.   D/w Drs. Haddix and Mikhail personally.  Glori Bickers, MD  10:44 PM

## 2018-05-09 DIAGNOSIS — S8012XA Contusion of left lower leg, initial encounter: Secondary | ICD-10-CM

## 2018-05-09 LAB — COMPREHENSIVE METABOLIC PANEL
ALT: 23 U/L (ref 0–44)
AST: 28 U/L (ref 15–41)
Albumin: 2.7 g/dL — ABNORMAL LOW (ref 3.5–5.0)
Alkaline Phosphatase: 141 U/L — ABNORMAL HIGH (ref 38–126)
Anion gap: 9 (ref 5–15)
BUN: 38 mg/dL — ABNORMAL HIGH (ref 8–23)
CALCIUM: 8.5 mg/dL — AB (ref 8.9–10.3)
CO2: 31 mmol/L (ref 22–32)
Chloride: 91 mmol/L — ABNORMAL LOW (ref 98–111)
Creatinine, Ser: 1.2 mg/dL (ref 0.61–1.24)
GFR calc non Af Amer: 57 mL/min — ABNORMAL LOW (ref >60)
Glucose, Bld: 152 mg/dL — ABNORMAL HIGH (ref 70–99)
Potassium: 4.8 mmol/L (ref 3.5–5.1)
SODIUM: 131 mmol/L — AB (ref 135–145)
Total Bilirubin: 1 mg/dL (ref 0.3–1.2)
Total Protein: 5.1 g/dL — ABNORMAL LOW (ref 6.5–8.1)

## 2018-05-09 LAB — COOXEMETRY PANEL
Carboxyhemoglobin: 2.1 % — ABNORMAL HIGH (ref 0.5–1.5)
Carboxyhemoglobin: 2.5 % — ABNORMAL HIGH (ref 0.5–1.5)
Methemoglobin: 1.9 % — ABNORMAL HIGH (ref 0.0–1.5)
Methemoglobin: 1.2 % (ref 0.0–1.5)
O2 Saturation: 89.7 %
O2 Saturation: 68.3 %
Total hemoglobin: 8.3 g/dL — ABNORMAL LOW (ref 12.0–16.0)
Total hemoglobin: 7.8 g/dL — ABNORMAL LOW (ref 12.0–16.0)

## 2018-05-09 LAB — CBC
HCT: 25.1 % — ABNORMAL LOW (ref 39.0–52.0)
Hemoglobin: 7.7 g/dL — ABNORMAL LOW (ref 13.0–17.0)
MCH: 27.9 pg (ref 26.0–34.0)
MCHC: 30.7 g/dL (ref 30.0–36.0)
MCV: 90.9 fL (ref 80.0–100.0)
Platelets: 149 10*3/uL — ABNORMAL LOW (ref 150–400)
RBC: 2.76 MIL/uL — ABNORMAL LOW (ref 4.22–5.81)
RDW: 17.1 % — ABNORMAL HIGH (ref 11.5–15.5)
WBC: 18.2 10*3/uL — ABNORMAL HIGH (ref 4.0–10.5)
nRBC: 0 % (ref 0.0–0.2)

## 2018-05-09 MED ORDER — SPIRONOLACTONE 25 MG PO TABS
50.0000 mg | ORAL_TABLET | Freq: Two times a day (BID) | ORAL | Status: DC
  Administered 2018-05-09 – 2018-05-15 (×13): 50 mg via ORAL
  Filled 2018-05-09 (×14): qty 2

## 2018-05-09 MED ORDER — BUMETANIDE 2 MG PO TABS
4.0000 mg | ORAL_TABLET | Freq: Two times a day (BID) | ORAL | Status: DC
  Filled 2018-05-09: qty 2

## 2018-05-09 MED ORDER — BUMETANIDE 2 MG PO TABS
4.0000 mg | ORAL_TABLET | Freq: Two times a day (BID) | ORAL | Status: DC
  Filled 2018-05-09 (×2): qty 2

## 2018-05-09 MED ORDER — HYDROCODONE-ACETAMINOPHEN 5-325 MG PO TABS
1.0000 | ORAL_TABLET | Freq: Four times a day (QID) | ORAL | Status: DC
  Administered 2018-05-09 – 2018-05-11 (×9): 1 via ORAL
  Filled 2018-05-09 (×10): qty 1

## 2018-05-09 MED ORDER — BUMETANIDE 2 MG PO TABS
2.0000 mg | ORAL_TABLET | Freq: Two times a day (BID) | ORAL | Status: DC
  Filled 2018-05-09: qty 1

## 2018-05-09 MED ORDER — BUMETANIDE 2 MG PO TABS
3.0000 mg | ORAL_TABLET | Freq: Two times a day (BID) | ORAL | Status: DC
  Administered 2018-05-09 – 2018-05-15 (×12): 3 mg via ORAL
  Filled 2018-05-09 (×13): qty 1

## 2018-05-09 MED ORDER — MORPHINE SULFATE (PF) 2 MG/ML IV SOLN
2.0000 mg | INTRAVENOUS | Status: DC | PRN
  Administered 2018-05-09 – 2018-05-12 (×5): 2 mg via INTRAVENOUS
  Filled 2018-05-09 (×6): qty 1

## 2018-05-09 NOTE — Progress Notes (Signed)
PROGRESS NOTE    Antonio Carrillo  QZR:[id number] DOB: April 12, 1940 DOA: 04/25/2018 PCP: Patient, No Pcp Per   Brief Narrative:  HPI On 04/25/2018 by Dr. Barb Merino Antonio Carrillo is a 78 y.o. male with medical history significant of CLL, hypertension, hyperlipidemia, paroxysmal A. 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  Patient has extensive medical problems.  He has history of atrial fibrillation with multiple ablations, aortic root replacement with CABG, right ventricular heart failure and CLL.  Patient has been going through multiple diuretics to help with his leg swellings.  According to the patient, it was manageable until about a month now.  Patient stated that his legs are huge and edematous, they were skinny before Christmas.  Patient gained about 40 pounds of weight since 1 month.  He had some ascites in the past, however since last 1 month he had to have it tapped 3 times.  Patient also has persistent nausea but no vomiting.  Patient also has pain due to tightness of the legs.  He also injured his right leg at home and had to have sutures placed which are draining liquid around the suture lines.  Patient denies any chest pain or palpitation.  He is short of breath on exertion but none at rest.  He denies orthopnea or paroxysmal nocturnal dyspnea.  Patient recently on Bumex.  He went to see cardiologist today in the office, due to significant symptoms while sent to inpatient admission. Patient examined at the bedside.  He was on room air.  His blood pressures are stable.  WBC count is elevated, however chronic.  His potassium was 5.5, he is on Aldactone and potassium supplement.  His hemoglobin is more or less at baseline.  Creatinine is 2.69 which is significantly above his baseline of 1.4.  Interim history Admitted  with CHF exacerbation, cardiology/HF team currently consulted and following. Orthopedics also consulted. Patient developed spontaneous hematoma in LLE, eliquis held.  Assessment & Plan   Acute on chronic combined systolic and diastolic heart failure  -with hyponatremia, ascites and pedal edema -Cardiology and palliative care consulted and appreciated -Patient was on IV Lasix -Was placed on a milrinone drip and currently weaning per cardiology recommendations -Monitor intake and output, daily weights -Echocardiogram 04/25/2018 showed normal LV systolic function, mild LVE, mild AI, mild  MR, severe biatrial enlargement, mild RVE with moderate RV dysfunction, severe TR  Paroxysmal atrial fibrillation -Patient has had multiple ablations and MAZE in the past -Cardiology currently following -Continue amiodarone -Eliquis on hold  LLE hematoma -spontaneous -LE doppler negative for DVT -CT LLE: No CT evidence of left lower extremity DVT.  Probable subcutaneous hematoma anterior laterally in the proximal left lower leg.  Generalized subcutaneous edema in the distal left lower leg, possibly cellulitis. -patient was briefly placed on vancomycin again on 2/5, however this is less likely infectious, will discontinue vancomycin (recent AKI) and monitor- patient also afebrile with improvement of leukocytosis -Orthopedics consulted and appreciated. Dr. Doreatha Martin able to aspirate 15cc serosanguinous fluid on 2/5.  -Discussed with Dr. Doreatha Martin, hold Eliquis- may restart on 05/10/2018. Continue ACE wrap to provide compression. No surgical intervention at this time.  Acute kidney injury -On admission, creatinine was 2.5, baseline less than 1 -Creatinine currently 1.20 -Continue to monitor BMP  Hypokalemia -Likely secondary to diuresis -Resolved -Continue to monitor BMP  Right foot  wound/dorsal laceration with leukocytosis -was seen by orthopedics at [facility] on 04/17/2018 and given Keflex -On  04/26/2018-  Patient noted to have swelling with weeping profusely and pedal edema -Orthopedic surgery consulted and appreciated, Dr. Doreatha Martin remove sutures and recommended antibiotics with doxycycline which is now been discontinued -Wound care consulted -Continue wet-to-dry dressings -Patient with continued erythema and swelling-cardiology started vancomycin on 05/02/2018- however discontinued on 05/07/2018  Anxiety/depression -Continue Zoloft, Ativan  Chronic anemia/iron deficiency anemia -pending CBC this morning -hemoglobin has been ranging from 7-8 -suspect there will be a drop given hematoma -if needed, will discuss transfusion with cardiology- given volume overload  CLL -Diagnosed in 2015 and followed at [facility] note from 04/01/2018 recommends observation only after his treatment and repeat CT in 3 months  Painful mouth and throat -Has been a chronic issue for several years and patient has been undergoing treatment for CLL -Continue Magic mouthwash with lidocaine 4 times daily PRN -Consider outpatient ENT or GI evaluations -No evidence of thrush  OSA -Continue CPAP nightly  Gout -Continue allopurinol  Left knee pain with arthritis -Continue Voltaren gel  Elevated TSH -Likely sick euthyroid, free T4 normal  Constipation -Continue bowel regimen  Abnormal LFTs -Resolved  Hyponatremia -Continue to monitor, currently 131  Thrombocytopenia -pending CBC -has been stable  Hyperglycemia -Hemoglobin A1c 6.1, continue insulin sliding scale, CBG monitoring  Goals of care and chronic pain -Patient currently DNR -Palliative care consulted and appreciate -given acute LLE pain, have changed Norco from scheduled BID to scheduled QID, with breakthrough morphine and hydrocodone  DVT Prophylaxis  Eliquis on hold given hematoma  Code Status:DNR  Family Communication: Family at bedside  Disposition Plan: Admitted. Dispo pending improvement and  further recommendations from cardiology  Consultants Cardiology Palliative care Orthopedic surgery  Procedures  PICC line placement 04/25/2018 Suture removal 04/26/2018  Antibiotics   Anti-infectives (From admission, onward)   Start     Dose/Rate Route Frequency Ordered Stop   05/08/18 1800  vancomycin (VANCOCIN) 1,500 mg in sodium chloride 0.9 % 500 mL IVPB  Status:  Discontinued     1,500 mg 250 mL/hr over 120 Minutes Intravenous Every 24 hours 05/08/18 1713 05/09/18 0821   05/02/18 1400  vancomycin (VANCOCIN) 1,500 mg in sodium chloride 0.9 % 500 mL IVPB  Status:  Discontinued     1,500 mg 250 mL/hr over 120 Minutes Intravenous Every 24 hours 05/02/18 1232 05/07/18 0854   04/30/18 2200  doxycycline (VIBRA-TABS) tablet 100 mg     100 mg Oral Every 12 hours 04/30/18 1401 05/01/18 0808   04/26/18 1330  doxycycline (VIBRA-TABS) tablet 100 mg  Status:  Discontinued     100 mg Oral Every 12 hours 04/26/18 1242 04/30/18 1401   04/25/18 1300  terbinafine (LAMISIL) tablet 250 mg     250 mg Oral Daily 04/25/18 1143        Subjective:   Antonio Carrillo seen and examined today.  No complaints this morning.  Feels left leg pain is more tolerable with morphine.  Feels breathing is also improved.  Denies chest pain.  Denies abdominal pain, nausea or vomiting, dizziness or headache.  Objective:   Vitals:   05/09/18 0015 05/09/18 0305 05/09/18 0405 05/09/18 0734  BP:   115/64 118/64  Pulse: 82  68 72  Resp: 18  (!) 22 17  Temp:   97.6 F (36.4 C) 98.3 F (36.8 C)  TempSrc:   Oral Oral  SpO2: 95%  94% 96%  Weight:  87.8 kg    Height:        Intake/Output Summary (Last 24 hours) at 05/09/2018 1015 Last data filed at 05/09/2018 0840 Gross per 24 hour  Intake 2241.22 ml  Output 2400 ml  Net -158.78 ml   Filed Weights   05/07/18 0047 05/08/18 0339 05/09/18 0305  Weight: 88.6 kg 87.6 kg 87.8 kg   Exam  General: Well developed, chronically ill-appearing, NAD  HEENT: NCAT, PERRLA,  EOMI, Anicteic Sclera, mucous membranes moist.   Neck: Supple, no JVD, no masses  Cardiovascular: S1 S2 auscultated, 2/6 SEM, RRR  Respiratory: Clear to auscultation bilaterally with equal chest rise  Abdomen: Soft, nontender, nondistended, + bowel sounds  Extremities: warm dry without cyanosis clubbing.  Right lower extremity dressing in place.  Compressive Ace bandage on LLE.  Neuro: AAOx3, nonfocal  Psych: Pleasant, appropriate mood and affect  Data Reviewed: I have personally reviewed following labs and imaging studies  CBC: Recent Labs  Lab 05/04/18 0500 05/05/18 0408 05/06/18 0438 05/07/18 0448 05/08/18 0356  WBC 14.2* 18.2* 14.7* 12.5* 12.4*  NEUTROABS 11.3* 14.9* 12.1* 10.1* 9.9*  HGB 7.6* 8.0* 7.8* 8.2* 7.9*  HCT 25.4* 26.8* 26.5* 26.6* 26.0*  MCV 91.4 89.9 92.0 92.4 89.3  PLT 152 139* 145* 146* 756*   Basic Metabolic Panel: Recent Labs  Lab 05/04/18 0500 05/05/18 0408 05/06/18 0438 05/07/18 0448 05/08/18 0356 05/09/18 0413  NA 131* 131* 133* 132* 132* 131*  K 2.9* 3.9 3.9 4.6 4.6 4.8  CL 88* 89* 92* 89* 90* 91*  CO2 30 29 30  32 34* 31  GLUCOSE 192* 173* 213* 227* 146* 152*  BUN 41* 43* 53* 45* 44* 38*  CREATININE 1.32* 1.31* 1.26* 1.12 1.19 1.20  CALCIUM 8.4* 8.7* 8.5* 8.7* 8.8* 8.5*  MG 2.3 2.5* 2.3 2.2 2.2  --   PHOS 2.9 3.2 2.5 2.6 2.7  --    GFR: Estimated Creatinine Clearance: 53.2 mL/min (by C-G formula based on SCr of 1.2 mg/dL). Liver Function Tests: Recent Labs  Lab 05/04/18 0500 05/05/18 0408 05/07/18 0448 05/08/18 0356 05/09/18 0413  AST 28 27 24 26 28   ALT 33 29 25 24 23   ALKPHOS 120 126 148* 150* 141*  BILITOT 0.8 0.8 0.5 0.8 1.0  PROT 5.0* 5.3* 5.2* 5.4* 5.1*  ALBUMIN 2.9* 2.9* 2.9* 2.9* 2.7*   No results for input(s): LIPASE, AMYLASE in the last 168 hours. No results for input(s): AMMONIA in the last 168 hours. Coagulation Profile: No results for input(s): INR, PROTIME in the last 168 hours. Cardiac Enzymes: No  results for input(s): CKTOTAL, CKMB, CKMBINDEX, TROPONINI in the last 168 hours. BNP (last 3 results) No results for input(s): PROBNP in the last 8760 hours. HbA1C: Recent Labs    05/08/18 0356  HGBA1C 6.1*   CBG: No results for input(s): GLUCAP in the last 168 hours. Lipid Profile: No results for input(s): CHOL, HDL, LDLCALC, TRIG, CHOLHDL, LDLDIRECT in the last 72 hours. Thyroid Function Tests: No results for input(s): TSH, T4TOTAL, FREET4, T3FREE, THYROIDAB in the last 72 hours. Anemia Panel: No results for input(s): VITAMINB12, FOLATE, FERRITIN, TIBC, IRON, RETICCTPCT in the last 72 hours. Urine analysis: No results found for: COLORURINE, APPEARANCEUR, LABSPEC, PHURINE, GLUCOSEU, HGBUR, BILIRUBINUR, KETONESUR, PROTEINUR, UROBILINOGEN, NITRITE, LEUKOCYTESUR Sepsis Labs: @LABRCNTIP (procalcitonin:4,lacticidven:4)  )No results found for this or any previous visit (from the past 240 hour(s)).    Radiology Studies: Ct Extremity Lower Left W Wo Contrast  Result Date: 05/08/2018 CLINICAL DATA:  Swelling and  erythema in the calf with lower leg pain. Concern for DVT. EXAM: CT OF THE LOWER LEFT EXTREMITY WITH CONTRAST TECHNIQUE: Multidetector CT imaging of the lower left extremity was performed before and during bolus administration of intravenous contrast. The images extend from the lower lumbar spine into the left foot. COMPARISON:  Limited correlation made with chest CT 06/18/2014. CONTRAST:  181mL OMNIPAQUE IOHEXOL 300 MG/ML  SOLN FINDINGS: Bones/Joint/Cartilage The large field of view utilized for this examination limits detail. Patient is status post left total knee arthroplasty. No evidence of hardware loosening. Ossific densities are noted posteromedial to the proximal fibula which do not appear acute. No definite acute fracture, dislocation or bone destruction. There are mild degenerative changes at the left hip. No significant arthropathy at the left ankle. Lower lumbar spondylosis  noted with age-indeterminate inferior endplate deformity at L4. Ligaments Suboptimally assessed by CT. Muscles and Tendons There is well-circumscribed ossification within the mid hamstring musculature, most consistent with an old muscular injury. Similar ossification is noted within the proximal calf musculature, likely of the same etiology. The extensor mechanism is intact. The Achilles tendon appears intact. No intramuscular fluid collections are identified. Soft tissues Lenticular shaped, heterogeneous subcutaneous fluid collection is present anterolaterally in the proximal left lower leg. This measures 6.9 x 3.1 cm transverse on image 261/4 and extends approximately 16 cm in length. Appearance is most consistent with a subacute hematoma. No associated foreign body or soft tissue emphysema. There is generalized subcutaneous edema in the lower leg without other focal fluid collection. No focal filling defects are seen within the deep veins to suggest DVT. Vascular assessment at the knee is limited by artifact from the arthroplasty. Scattered arterial atherosclerosis noted. IMPRESSION: 1. No CT evidence of left lower extremity DVT. If that remains a clinical concern recommend evaluation with Doppler ultrasound. 2. Probable subcutaneous hematoma anterolaterally in the proximal left lower leg. 3. Generalized subcutaneous edema in the distal left lower leg, possibly cellulitis. 4. No acute osseous findings in the left lower extremity. Age-indeterminate L4 compression deformity. Electronically Signed   By: Richardean Sale M.D.   On: 05/08/2018 17:56   Vas [place] Lower Extremity Venous (dvt)  Result Date: 05/08/2018  Lower Venous Study Indications: Swelling, and cellulitis.  Performing Technologist: Maudry Mayhew MHA, RDMS, RVT, RDCS  Examination Guidelines: A complete evaluation includes B-mode imaging, spectral Doppler, color Doppler, and power Doppler as needed of all accessible portions of each vessel. Bilateral  testing is considered an integral part of a complete examination. Limited examinations for reoccurring indications may be performed as noted.  Right Venous Findings: +---+---------------+---------+-----------+----------+--------------+    CompressibilityPhasicitySpontaneityPropertiesSummary        +---+---------------+---------+-----------+----------+--------------+ CFVFull                    Yes                  Pulsatile flow +---+---------------+---------+-----------+----------+--------------+  Left Venous Findings: +---------+---------------+---------+-----------+----------+--------------+          CompressibilityPhasicitySpontaneityPropertiesSummary        +---------+---------------+---------+-----------+----------+--------------+ CFV      Full           No       Yes                  Pulsatile flow +---------+---------------+---------+-----------+----------+--------------+ SFJ      Full                                                        +---------+---------------+---------+-----------+----------+--------------+  FV Prox  Full                                                        +---------+---------------+---------+-----------+----------+--------------+ FV Mid   Full                                                        +---------+---------------+---------+-----------+----------+--------------+ FV DistalFull                                                        +---------+---------------+---------+-----------+----------+--------------+ PFV      Full                                                        +---------+---------------+---------+-----------+----------+--------------+ POP      Full           No       Yes                  Pulsatile flow +---------+---------------+---------+-----------+----------+--------------+ PTV      Full                                                         +---------+---------------+---------+-----------+----------+--------------+ PERO                                                  Not visualized +---------+---------------+---------+-----------+----------+--------------+    Summary: Right: No evidence of common femoral vein obstruction. Left: There is no evidence of deep vein thrombosis in the lower extremity. However, portions of this examination were limited- see technologist comments above. No cystic structure found in the popliteal fossa. Pulsatile venous flow is suggestive of potentially elevated right-sided heart failure.  *See table(s) above for measurements and observations. Electronically signed by Curt Jews MD on 05/08/2018 at 9:42:00 PM.    Final      Scheduled Meds: . allopurinol  100 mg Oral Daily  . amiodarone  200 mg Oral BID  . diclofenac sodium  2 g Topical QID  . feeding supplement (ENSURE ENLIVE)  237 mL Oral BID BM  . HYDROcodone-acetaminophen  1 tablet Oral Q6H  . iron polysaccharides  150 mg Oral Daily  . LORazepam  0.5 mg Oral TID  . multivitamin with minerals  1 tablet Oral Daily  . polyethylene glycol  17 g Oral BID  . senna-docusate  1 tablet Oral BID  . sertraline  200 mg Oral Daily  . sodium chloride flush  10-40 mL Intracatheter Q12H  . sodium chloride flush  3 mL Intravenous Q12H  . terbinafine  250 mg Oral Daily   Continuous Infusions: . sodium chloride Stopped (05/03/18 1325)  . milrinone 0.5 mcg/kg/min (05/09/18 0840)     LOS: 14 days   Time Spent in minutes   45 minutes  Tytianna Greenley D.O. on 05/09/2018 at 10:15 AM  Between 7am to 7pm - Please see pager noted on [email or web address]  After 7pm go to [email or web address]  And look for the night coverage person covering for me after hours  [facility] Office  [phone]

## 2018-05-09 NOTE — Progress Notes (Addendum)
Patient ID: Antonio Carrillo, male   DOB: 1940-04-17, 78 y.o.   MRN: [id number]     Advanced Heart Failure Rounding Note  PCP-Cardiologist: No primary care provider on file.   Subjective:    Admitted from clinic 04/25/2018 with marked volume overloaded and abdominal distention.   Pt had acute LLE pain yesterday afternoon. LLE duplex showed no DVT. CT of LLE showed probable SQ hematoma in proximal LLE and general SQ edema, possibly cellulitis. Eliquis, lasix, spiro, metolazone, and potassium were stopped.   Ortho consulted. Aspirated 15 cc of serosanguinous fluid. Recommended holding Eliquis until tomorrow. No s/s compartment syndrome. No role for surgical intervention. Continue compressive wrap.  Coox inaccurate today. Recheck ordered. Remains on milrinone 0.5 mcg/kg/min. SBP 110s. Received 120 mg IV lasix x1 + metolazone 2.5 mg x1 with modest diuresis. Negative 1.3 L. Weight unchanged. HF meds on hold. Creatinine stable 1.20. CVP not hooked up.   Denies CP or SOB. Mouth still sore - requesting magic mouthwash. LLE pain much improved.   Echo 04/25/2018: LV EF 55-60%, D-shaped interventricular septum with RV enlargement and dysfunction, severe TR.   Objective:   Weight Range: 87.8 kg Body mass index is 26.99 kg/m.   Vital Signs:   Temp:  [97.6 F (36.4 C)-98.4 F (36.9 C)] 98.3 F (36.8 C) (02/06 0734) Pulse Rate:  [68-82] 72 (02/06 0734) Resp:  [17-22] 17 (02/06 0734) BP: (113-118)/(57-67) 118/64 (02/06 0734) SpO2:  [94 %-97 %] 96 % (02/06 0734) Weight:  [87.8 kg] 87.8 kg (02/06 0305) Last BM Date: 05/08/18  Weight change: Filed Weights   05/07/18 0047 05/08/18 0339 05/09/18 0305  Weight: 88.6 kg 87.6 kg 87.8 kg   Intake/Output:   Intake/Output Summary (Last 24 hours) at 05/09/2018 1058 Last data filed at 05/09/2018 0840 Gross per 24 hour  Intake 2121.22 ml  Output 1950 ml  Net 171.22 ml    Physical Exam   General:  No resp difficulty. HEENT: Normal Neck: Supple. JVP  ~10. Carotids 2+ bilat; no bruits. No thyromegaly or nodule noted. Cor: PMI nondisplaced. RRR, 2/6 TR, +RV lift.  Lungs: CTAB, normal effort. Abdomen: Soft, non-tender, +distended, no HSM. No bruits or masses. +BS  Extremities: No cyanosis, clubbing, or rash. R and LLE no edema. BLE ace wrapped. RUE DL PICC.  Neuro: Alert & orientedx3, cranial nerves grossly intact. moves all 4 extremities w/o difficulty. Affect pleasant GI: +glossitis  Telemetry   NSR 60-70s, personally reviewed.   Labs    CBC Recent Labs    05/07/18 0448 05/08/18 0356  WBC 12.5* 12.4*  NEUTROABS 10.1* 9.9*  HGB 8.2* 7.9*  HCT 26.6* 26.0*  MCV 92.4 89.3  PLT 146* 993*   Basic Metabolic Panel Recent Labs    05/07/18 0448 05/08/18 0356 05/09/18 0413  NA 132* 132* 131*  K 4.6 4.6 4.8  CL 89* 90* 91*  CO2 32 34* 31  GLUCOSE 227* 146* 152*  BUN 45* 44* 38*  CREATININE 1.12 1.19 1.20  CALCIUM 8.7* 8.8* 8.5*  MG 2.2 2.2  --   PHOS 2.6 2.7  --    Liver Function Tests Recent Labs    05/08/18 0356 05/09/18 0413  AST 26 28  ALT 24 23  ALKPHOS 150* 141*  BILITOT 0.8 1.0  PROT 5.4* 5.1*  ALBUMIN 2.9* 2.7*   No results for input(s): LIPASE, AMYLASE in the last 72 hours. Cardiac Enzymes No results for input(s): CKTOTAL, CKMB, CKMBINDEX, TROPONINI in the last 72 hours.  BNP: BNP (  last 3 results) Recent Labs    04/25/18 0949  BNP 1,544.3*    ProBNP (last 3 results) No results for input(s): PROBNP in the last 8760 hours.   D-Dimer No results for input(s): DDIMER in the last 72 hours. Hemoglobin A1C Recent Labs    05/08/18 0356  HGBA1C 6.1*   Fasting Lipid Panel No results for input(s): CHOL, HDL, LDLCALC, TRIG, CHOLHDL, LDLDIRECT in the last 72 hours. Thyroid Function Tests No results for input(s): TSH, T4TOTAL, T3FREE, THYROIDAB in the last 72 hours.  Invalid input(s): FREET3  Other results:   Imaging    Ct Extremity Lower Left W Wo Contrast  Result Date:  05/08/2018 CLINICAL DATA:  Swelling and erythema in the calf with lower leg pain. Concern for DVT. EXAM: CT OF THE LOWER LEFT EXTREMITY WITH CONTRAST TECHNIQUE: Multidetector CT imaging of the lower left extremity was performed before and during bolus administration of intravenous contrast. The images extend from the lower lumbar spine into the left foot. COMPARISON:  Limited correlation made with chest CT 06/18/2014. CONTRAST:  149mL OMNIPAQUE IOHEXOL 300 MG/ML  SOLN FINDINGS: Bones/Joint/Cartilage The large field of view utilized for this examination limits detail. Patient is status post left total knee arthroplasty. No evidence of hardware loosening. Ossific densities are noted posteromedial to the proximal fibula which do not appear acute. No definite acute fracture, dislocation or bone destruction. There are mild degenerative changes at the left hip. No significant arthropathy at the left ankle. Lower lumbar spondylosis noted with age-indeterminate inferior endplate deformity at L4. Ligaments Suboptimally assessed by CT. Muscles and Tendons There is well-circumscribed ossification within the mid hamstring musculature, most consistent with an old muscular injury. Similar ossification is noted within the proximal calf musculature, likely of the same etiology. The extensor mechanism is intact. The Achilles tendon appears intact. No intramuscular fluid collections are identified. Soft tissues Lenticular shaped, heterogeneous subcutaneous fluid collection is present anterolaterally in the proximal left lower leg. This measures 6.9 x 3.1 cm transverse on image 261/4 and extends approximately 16 cm in length. Appearance is most consistent with a subacute hematoma. No associated foreign body or soft tissue emphysema. There is generalized subcutaneous edema in the lower leg without other focal fluid collection. No focal filling defects are seen within the deep veins to suggest DVT. Vascular assessment at the knee is  limited by artifact from the arthroplasty. Scattered arterial atherosclerosis noted. IMPRESSION: 1. No CT evidence of left lower extremity DVT. If that remains a clinical concern recommend evaluation with Doppler ultrasound. 2. Probable subcutaneous hematoma anterolaterally in the proximal left lower leg. 3. Generalized subcutaneous edema in the distal left lower leg, possibly cellulitis. 4. No acute osseous findings in the left lower extremity. Age-indeterminate L4 compression deformity. Electronically Signed   By: Richardean Sale M.D.   On: 05/08/2018 17:56   Vas [place] Lower Extremity Venous (dvt)  Result Date: 05/08/2018  Lower Venous Study Indications: Swelling, and cellulitis.  Performing Technologist: Maudry Mayhew MHA, RDMS, RVT, RDCS  Examination Guidelines: A complete evaluation includes B-mode imaging, spectral Doppler, color Doppler, and power Doppler as needed of all accessible portions of each vessel. Bilateral testing is considered an integral part of a complete examination. Limited examinations for reoccurring indications may be performed as noted.  Right Venous Findings: +---+---------------+---------+-----------+----------+--------------+    CompressibilityPhasicitySpontaneityPropertiesSummary        +---+---------------+---------+-----------+----------+--------------+ CFVFull                    Yes  Pulsatile flow +---+---------------+---------+-----------+----------+--------------+  Left Venous Findings: +---------+---------------+---------+-----------+----------+--------------+          CompressibilityPhasicitySpontaneityPropertiesSummary        +---------+---------------+---------+-----------+----------+--------------+ CFV      Full           No       Yes                  Pulsatile flow +---------+---------------+---------+-----------+----------+--------------+ SFJ      Full                                                         +---------+---------------+---------+-----------+----------+--------------+ FV Prox  Full                                                        +---------+---------------+---------+-----------+----------+--------------+ FV Mid   Full                                                        +---------+---------------+---------+-----------+----------+--------------+ FV DistalFull                                                        +---------+---------------+---------+-----------+----------+--------------+ PFV      Full                                                        +---------+---------------+---------+-----------+----------+--------------+ POP      Full           No       Yes                  Pulsatile flow +---------+---------------+---------+-----------+----------+--------------+ PTV      Full                                                        +---------+---------------+---------+-----------+----------+--------------+ PERO                                                  Not visualized +---------+---------------+---------+-----------+----------+--------------+    Summary: Right: No evidence of common femoral vein obstruction. Left: There is no evidence of deep vein thrombosis in the lower extremity. However, portions of this examination were limited- see technologist comments above. No cystic structure found in the popliteal fossa. Pulsatile venous flow is suggestive of potentially elevated right-sided heart failure.  *See table(s) above for measurements and  observations. Electronically signed by Curt Jews MD on 05/08/2018 at 9:42:00 PM.    Final      Medications:     Scheduled Medications: . allopurinol  100 mg Oral Daily  . amiodarone  200 mg Oral BID  . diclofenac sodium  2 g Topical QID  . feeding supplement (ENSURE ENLIVE)  237 mL Oral BID BM  . HYDROcodone-acetaminophen  1 tablet Oral Q6H  . iron polysaccharides  150 mg Oral Daily  .  LORazepam  0.5 mg Oral TID  . multivitamin with minerals  1 tablet Oral Daily  . polyethylene glycol  17 g Oral BID  . senna-docusate  1 tablet Oral BID  . sertraline  200 mg Oral Daily  . sodium chloride flush  10-40 mL Intracatheter Q12H  . sodium chloride flush  3 mL Intravenous Q12H  . terbinafine  250 mg Oral Daily    Infusions: . sodium chloride Stopped (05/03/18 1325)  . milrinone 0.5 mcg/kg/min (05/09/18 0840)    PRN Medications: sodium chloride, benzocaine, bisacodyl, HYDROcodone-acetaminophen, lidocaine, magic mouthwash w/lidocaine, morphine injection, ondansetron **OR** ondansetron (ZOFRAN) IV, prochlorperazine, sodium chloride flush, sodium chloride flush, white petrolatum  Patient Profile   Antonio Carrillo is a 78 y.o. male with h/o AF s/p several ablations, aortic root aneurysm, s/p aortic root replacement with CABG 1, RV failure, CLL, and ascites.   Sent for admission from HF clinic 04/25/2018 with marked volume overload on exam, RLE wound, and marked ascites.   Assessment/Plan   1. 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 [facility]MRI 4/18 at [facility] with normal LV function. RV severely dilated and mild RV dysfunction.  Echo this admission with LV EF 55-60%, RV dilated and dysfunctional, severe TR, D-shaped septum.  - Co-ox inaccurate. Recheck ordered. Remains on milrinone 0.5 mcg/kg/min. Will attempt wean once fully diuresed. Down 30 lbs total, no change overnight. May be able to start wean today, pending redraw and CVP. - Lasix, metolazone, and spiro held yesterday with acute hematoma.  - RN to get CVP set up. Can likely restart spiro and lasix today. Will wait to see what CVP is to determine IV vs PO. - Long-term prognosis is quite poor.  With RV failure, not candidate for LVAD and also would be poor HD candidate. Palliative Care following. He has been resistant to [place] discussions. He is hopeful that he may get  some recovery. He was very satisfied with his QOL recently and would like to get back to that. We will continue inotrope support and diuresis.  2. PAF: S/p multiple ablations and MAZE.  - Remains in NSR.   - Continue amiodarone 200 mg BID while on milrinone.   - Now off digoxin. Creatinine improving maybe able to restart soon. Creatinine 1.20 today.  - Eliquis on hold now with hematoma. Hgb 7.9 yesterday. Check CBC today.  3. CLL: Per Oncology.  Now s/p pelvic XRT. No change.  4. CAD and CABG x 1:  - No s/s ischemia.  - No ASA given apixaban use.     5. Aortic root aneurysm s/p repair  6. RLE laceration: Appreciate WOC and Ortho input. No fever or chills.  - Management per Ortho. Appreciate their input. No change.  7. Iron deficiency Anemia: No overt bleeding. Apixiban now on hold.  - With marked volume overload, would not transfuse until Hgb < 7. No CBC today. Hemoglobin 8.3 on coox. Check CBC.  - Transferrin  saturation low.  - Received feraheme 11/25.  8. Ascites: Persists.  S/p 2 paracenteses recently with 4.1 and 1.3 L out. 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 Cytology from paracentesis has repeatedly been negative for malignancy - Consider repeat paracentesis as needed. No change.  9. Hyponatremia:  - Sodium dropped to 119.. Improved after a couple doses of  tolvaptan.  - Sodium stable at 131 - Continue free water restrict.  10. Hypokalemia - Potassium stabilized now. K 4.8. - Potassium and spiro on hold. Can likely restart spiro and lasix (IV vs PO) today.  11. Hypotension - Stable. SBP 110s. - Consider midodrine as needed 12. LLE cellulitis  - Improving. Finished Vancomycin.  No change.  13. ? 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 No change.  14. LLE hematoma - Ortho following. Aspirated 15 cc serosanguinous fluid yesterday. Eliquis on hold. Recommended holding Eliquis until tomorrow. No role  for surgery. Continue compressive wrap. - CBC ordered. - Pain much improved on current regimen.   Addendum: Resume spiro 50 mg BID Resume bumex 4 mg BID Decrease milrinone to 0.375 mcg/kg/min  Georgiana Shore, NP  05/09/2018 10:58 AM   Advanced Heart Failure Team Pager [phone] (M-F; 7a - 4p)  Please contact [facility] for night-coverage after hours (4p -7a ) and weekends on [email or web address]  Patient seen and examined with the above-signed Advanced Practice Provider and/or Housestaff. I personally reviewed laboratory data, imaging studies and relevant notes. I independently examined the patient and formulated the important aspects of the plan. I have edited the note to reflect any of my changes or salient points. I have personally discussed the plan with the patient and/or family.  LLE pain much improved today. Appreciate Ortho's assistance. Leg now wrapped. Eliquis on hold for several days due to hematoma. Volume status continues to improve. Will restart po bumex at 3 bid. Will begin slow wean of milrinone support in setting of end-stage RV failure. Electrolytes stablized.   Glori Bickers, MD  4:57 PM

## 2018-05-09 NOTE — Progress Notes (Signed)
Daily Progress Note   Patient Name: Antonio Carrillo       Date: 05/09/2018 DOB: 10-05-1940  Age: 78 y.o. MRN#: [id number] Attending Physician: Cristal Ford, DO Primary Care Physician: Patient, No Pcp Per Admit Date: 04/25/2018  Reason for Consultation/Follow-up: Establishing goals of care, Non pain symptom management and Pain control  Subjective:  Patient awake, alert, oriented. Sitting on side of the bed. Reports his anxiety and pain are well controlled on current regimen. Wife assisting with bathing. Appetite fair.  Dr. Haroldine Laws came to bedside during visit to discuss plan of care and watchful waiting with milrinone wean.   Family has PMT contact information and understands they can call with questions or concerns.   Length of Stay: 14  Current Medications: Scheduled Meds:  . allopurinol  100 mg Oral Daily  . amiodarone  200 mg Oral BID  . bumetanide  3 mg Oral BID  . diclofenac sodium  2 g Topical QID  . feeding supplement (ENSURE ENLIVE)  237 mL Oral BID BM  . HYDROcodone-acetaminophen  1 tablet Oral Q6H  . iron polysaccharides  150 mg Oral Daily  . LORazepam  0.5 mg Oral TID  . multivitamin with minerals  1 tablet Oral Daily  . polyethylene glycol  17 g Oral BID  . senna-docusate  1 tablet Oral BID  . sertraline  200 mg Oral Daily  . sodium chloride flush  10-40 mL Intracatheter Q12H  . sodium chloride flush  3 mL Intravenous Q12H  . spironolactone  50 mg Oral BID  . terbinafine  250 mg Oral Daily    Continuous Infusions: . sodium chloride Stopped (05/03/18 1325)  . milrinone 0.375 mcg/kg/min (05/09/18 1223)    PRN Meds: sodium chloride, benzocaine, bisacodyl, HYDROcodone-acetaminophen, lidocaine, magic mouthwash w/lidocaine, morphine injection, ondansetron **OR**  ondansetron (ZOFRAN) IV, prochlorperazine, sodium chloride flush, sodium chloride flush, white petrolatum  Physical Exam Vitals signs and nursing note reviewed.  Constitutional:      General: He is awake.  HENT:     Head: Normocephalic and atraumatic.  Cardiovascular:     Rate and Rhythm: Regular rhythm.  Pulmonary:     Effort: No tachypnea, accessory muscle usage or respiratory distress.  Abdominal:     Tenderness: There is no abdominal tenderness.  Skin:    General: Skin is warm  and dry.     Coloration: Skin is pale.  Neurological:     Mental Status: He is alert and oriented to person, place, and time.  Psychiatric:        Mood and Affect: Mood normal.        Speech: Speech normal.        Behavior: Behavior is cooperative.        Cognition and Memory: Cognition normal.            Vital Signs: BP (!) 111/58 (BP Location: Left Arm)   Pulse 65   Temp 98.2 F (36.8 C) (Oral)   Resp 20   Ht 5\' 11"  (1.803 m)   Wt 87.8 kg   SpO2 98%   BMI 26.99 kg/m  SpO2: SpO2: 98 % O2 Device: O2 Device: Room Air O2 Flow Rate: O2 Flow Rate (L/min): 2 L/min  Intake/output summary:   Intake/Output Summary (Last 24 hours) at 05/09/2018 1337 Last data filed at 05/09/2018 1300 Gross per 24 hour  Intake 1883.22 ml  Output 2350 ml  Net -466.78 ml   LBM: Last BM Date: 05/08/18 Baseline Weight: Weight: 101.3 kg Most recent weight: Weight: 87.8 kg       Palliative Assessment/Data: PPS 50%   Flowsheet Rows     Most Recent Value  Intake Tab  Referral Department  Cardiology  Unit at Time of Referral  Cardiac/Telemetry Unit  Palliative Care Primary Diagnosis  Cardiac  Date Notified  04/26/18  Palliative Care Type  New Palliative care  Reason for referral  Clarify Goals of Care  Date of Admission  04/25/18  Date first seen by Palliative Care  04/30/18  # of days Palliative referral response time  4 Day(s)  # of days IP prior to Palliative referral  1  Clinical Assessment  Psychosocial  & Spiritual Assessment  Palliative Care Outcomes      Patient Active Problem List   Diagnosis Date Noted  . Leg hematoma, left, initial encounter 05/09/2018  . Anxiety state   . Chronic bilateral low back pain without sciatica   . Palliative care by specialist   . Right foot injury 04/26/2018  . Palliative care encounter   . Acute on chronic combined systolic and diastolic CHF (congestive heart failure) ([place]) 04/25/2018  . AKI (acute kidney injury) ([place]) 04/25/2018  . Hyperkalemia 04/25/2018  . Ascites 04/25/2018  . CLL (chronic lymphocytic leukemia) ([place]) 04/25/2018  . Anemia 04/25/2018  . CHF (congestive heart failure) ([place]) 04/25/2018  . 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 Coronary artery disease 07/28/2015  . Pericardial effusion 01/19/2015  . Cerebrovascular disease 01/19/2015  . Anticoagulants causing adverse effect in therapeutic use   . Hyperthyroidism   . Testicular failure   . Aortic aneurysm ([place])   . HTN (hypertension)   . Hyperlipidemia   . 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 PAD (peripheral artery disease) ([place]) 10/31/2010  . Long term (current) use of anticoagulants 06/22/2010  . SHORTNESS OF BREATH 12/28/2008  . CELLULITIS, MILD 12/11/2008  . HYPERLIPIDEMIA-MIXED 11/16/2008  . Aneurysm of thoracic aorta ([place]) 11/16/2008  . HYPERTHYROIDISM 12/05/2006  . ATRIAL FIBRILLATION 12/05/2006    Palliative Care Assessment & Plan   Patient Profile: 78 y.o. male  with past medical history of CLL, HTN, HLD, RV failure, atrial fibrillation (on Eliquis), s/p multiple ablations, s/p CABG, severe right ventricular failure, recurrent ascites admitted on 04/25/2018 with leg swelling and weight gain 40 lbs x 1 month. Now inotrope dependent and  continues with aggressive diuresis although renal function has improved.   Assessment: Acute on chronic combined CHF Hyponatremia Ascites Pedal edema PAF AKI Right foot  wound/cellulitis Anxiety Depression Insomnia CLL  Recommendations/Plan:  Discussed symptom management medications in detail with patient and daughter. Patient wishes for ativan and norco to be given at separate times because they are more effective and he is not as groggy. Scheduled ativan 1200, 2000, 0400. Norco now scheduled q6h. Morphine IV prn severe pain.   Continue scheduled Miralax and Senna  Continue prn magic mouthwash.   Continue current plan of care per attending and cardiology.   Code Status: DNR   Code Status Orders  (From admission, onward)         Start     Ordered   05/01/18 1044  Do not attempt resuscitation (DNR)  Continuous    Question Answer Comment  In the event of cardiac or respiratory ARREST Do not call a "code blue"   In the event of cardiac or respiratory ARREST Do not perform Intubation, CPR, defibrillation or ACLS   In the event of cardiac or respiratory ARREST Use medication by any route, position, wound care, and other measures to relive pain and suffering. May use oxygen, suction and manual treatment of airway obstruction as needed for comfort.      05/01/18 1043        Code Status History    Date Active Date Inactive Code Status Order ID Comments User Context   04/25/2018 1143 05/01/2018 1043 Full Code [id number]  Barb Merino, MD Inpatient       Prognosis:   Poor long-term prognosis  Discharge Planning:  To Be Determined  Care plan was discussed with patient, wife  Thank you for allowing the Palliative Medicine Team to assist in the care of this patient.   Time In: 1320 Time Out: 1335 Total Time 15 Prolonged Time Billed no      Greater than 50%  of this time was spent counseling and coordinating care related to the above assessment and plan.  Ihor Dow, FNP-C Palliative Medicine Team  Phone: [phone] Fax: [phone]  Please contact Palliative Medicine Team phone at [phone] for questions and concerns.

## 2018-05-09 NOTE — Progress Notes (Signed)
Orthopaedic Trauma Progress Note  Had some pain overnight in leg. ACE wrap taken down this AM. Unchanged and no increased in size. No signs of compartment syndrome.  WBAT LLE, no restrictions Compressive wrap to LLE No role for surgical intervention Would recommend holding Eliquis until tomorrow  Shona Needles, MD [facility] [phone] (phone)

## 2018-05-09 NOTE — Progress Notes (Signed)
RT Note:  Patient refuses CPAP, CPAP has already been pulled from room.

## 2018-05-09 NOTE — Progress Notes (Signed)
@  approx. 0140 ice pack applied to R calf hematoma site above dressing 2/2 inadequate pain relief from scheduled and PRN pain medications (Vicodin, Morphine). Pt endorses sporadic throbbing and aching. Site unchanged from previous assessment and pulse palpable. Will continue to monitor.

## 2018-05-09 NOTE — Progress Notes (Signed)
Physical Therapy Treatment Patient Details Name: Antonio Carrillo MRN: [id number] DOB: Dec 21, 1940 Today's Date: 05/09/2018    History of Present Illness Antonio Carrillo is a 78 y.o. male with medical history significant of CLL, hypertension, hyperlipidemia, paroxysmal A. 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 Was started on a Milrinone gtt and getting diuresed with [facility] Following. During the hospitalization his Left Leg developed  new warmth and Erythema and ? Venous Stasis changes vs. Cellulitis but Cardiology started the patient on IV Vancomycin.    PT Comments    Pt was well motivated today to get up and walk, and was more able to assist than previous notes indicate.  Follows instructions well, and noted with a few standing rests was very able to tolerate a longer walk on the hallway.  Repositioned legs on the bed for edema management and pain control, with pt assured PT would continue to see him during this admission.   Follow Up Recommendations  Home health PT;Supervision/Assistance - 24 hour     Equipment Recommendations  None recommended by PT    Recommendations for Other Services       Precautions / Restrictions Precautions Precautions: Fall Restrictions Weight Bearing Restrictions: No    Mobility  Bed Mobility Overal bed mobility: Needs Assistance Bed Mobility: Supine to Sit;Sit to Supine     Supine to sit: Min guard Sit to supine: Min assist   General bed mobility comments: pt is interested in moving himself to side of bed to avoid pain in legs   Transfers Overall transfer level: Needs assistance Equipment used: Rolling walker (2 wheeled) Transfers: Sit to/from Stand Sit to Stand: Min guard;Min assist         General transfer comment: pt  could initiate standing but needs help with lines  Ambulation/Gait Ambulation/Gait assistance: Min guard Gait Distance (Feet): 250 Feet Assistive device: Rolling walker (2 wheeled) Gait Pattern/deviations: Step-through pattern;Decreased stride length Gait velocity: reduced Gait velocity interpretation: <1.31 ft/sec, indicative of household ambulator General Gait Details: Pt walks with controlled use of RW, slow pace and took 3 standing short rests   Stairs             Wheelchair Mobility    Modified Rankin (Stroke Patients Only)       Balance Overall balance assessment: Needs assistance Sitting-balance support: Feet supported Sitting balance-Leahy Scale: Good     Standing balance support: Bilateral upper extremity supported Standing balance-Leahy Scale: Fair Standing balance comment: RW needed once standing is dynamic                            Cognition Arousal/Alertness: Awake/alert Behavior During Therapy: WFL for tasks assessed/performed Overall Cognitive Status: Within Functional Limits for tasks assessed                                        Exercises      General Comments General comments (skin integrity, edema, etc.): pt is very unsteady but can gain control of standing with walker and propel walker on the hall      Pertinent Vitals/Pain Pain Assessment: Faces Faces Pain Scale: Hurts little more Pain Location: B lower  legs Pain Descriptors / Indicators: Sore Pain Intervention(s): Monitored during session;Premedicated before session;Repositioned    Home Living                      Prior Function            PT Goals (current goals can now be found in the care plan section) Acute Rehab PT Goals Patient Stated Goal: to walk and go home Progress towards PT goals: Progressing toward goals    Frequency    Min 3X/week      PT Plan Current plan remains appropriate    Co-evaluation               AM-PAC PT "6 Clicks" Mobility   Outcome Measure  Help needed turning from your back to your side while in a flat bed without using bedrails?: None Help needed moving from lying on your back to sitting on the side of a flat bed without using bedrails?: A Little Help needed moving to and from a bed to a chair (including a wheelchair)?: A Little Help needed standing up from a chair using your arms (e.g., wheelchair or bedside chair)?: A Little Help needed to walk in hospital room?: A Little Help needed climbing 3-5 steps with a railing? : A Lot 6 Click Score: 18    End of Session Equipment Utilized During Treatment: Gait belt Activity Tolerance: Patient limited by fatigue Patient left: with call bell/phone within reach;in bed;with family/visitor present Nurse Communication: Mobility status PT Visit Diagnosis: Unsteadiness on feet (R26.81);Muscle weakness (generalized) (M62.81)     Time: 1610-9604 PT Time Calculation (min) (ACUTE ONLY): 32 min  Charges:  $Gait Training: 8-22 mins $Therapeutic Activity: 8-22 mins                    Ramond Dial 05/09/2018, 8:51 PM   Mee Hives, PT MS Acute Rehab Dept. Number: [place] [facility] [place]

## 2018-05-10 DIAGNOSIS — S8012XA Contusion of left lower leg, initial encounter: Secondary | ICD-10-CM

## 2018-05-10 LAB — COOXEMETRY PANEL
Carboxyhemoglobin: 2.6 % — ABNORMAL HIGH (ref 0.5–1.5)
Carboxyhemoglobin: 2.3 % — ABNORMAL HIGH (ref 0.5–1.5)
Methemoglobin: 1.4 % (ref 0.0–1.5)
Methemoglobin: 1.6 % — ABNORMAL HIGH (ref 0.0–1.5)
O2 Saturation: 81.3 %
O2 Saturation: 90.7 %
Total hemoglobin: 8.4 g/dL — ABNORMAL LOW (ref 12.0–16.0)
Total hemoglobin: 8.5 g/dL — ABNORMAL LOW (ref 12.0–16.0)

## 2018-05-10 LAB — COMPREHENSIVE METABOLIC PANEL
ALT: 26 U/L (ref 0–44)
AST: 33 U/L (ref 15–41)
Albumin: 2.7 g/dL — ABNORMAL LOW (ref 3.5–5.0)
Alkaline Phosphatase: 137 U/L — ABNORMAL HIGH (ref 38–126)
Anion gap: 10 (ref 5–15)
BUN: 43 mg/dL — AB (ref 8–23)
CO2: 31 mmol/L (ref 22–32)
CREATININE: 1.2 mg/dL (ref 0.61–1.24)
Calcium: 8.5 mg/dL — ABNORMAL LOW (ref 8.9–10.3)
Chloride: 90 mmol/L — ABNORMAL LOW (ref 98–111)
GFR calc Af Amer: >60 mL/min (ref >60)
GFR calc non Af Amer: 57 mL/min — ABNORMAL LOW (ref >60)
Glucose, Bld: 124 mg/dL — ABNORMAL HIGH (ref 70–99)
POTASSIUM: 4.6 mmol/L (ref 3.5–5.1)
Sodium: 131 mmol/L — ABNORMAL LOW (ref 135–145)
Total Bilirubin: 0.9 mg/dL (ref 0.3–1.2)
Total Protein: 5.2 g/dL — ABNORMAL LOW (ref 6.5–8.1)

## 2018-05-10 LAB — CBC
HEMATOCRIT: 26.4 % — AB (ref 39.0–52.0)
HEMOGLOBIN: 8.1 g/dL — AB (ref 13.0–17.0)
MCH: 27.9 pg (ref 26.0–34.0)
MCHC: 30.7 g/dL (ref 30.0–36.0)
MCV: 91 fL (ref 80.0–100.0)
Platelets: 139 10*3/uL — ABNORMAL LOW (ref 150–400)
RBC: 2.9 MIL/uL — ABNORMAL LOW (ref 4.22–5.81)
RDW: 16.9 % — AB (ref 11.5–15.5)
WBC: 14.1 10*3/uL — ABNORMAL HIGH (ref 4.0–10.5)
nRBC: 0 % (ref 0.0–0.2)

## 2018-05-10 MED ORDER — AMIODARONE LOAD VIA INFUSION
150.0000 mg | Freq: Once | INTRAVENOUS | Status: AC
  Administered 2018-05-10: 150 mg via INTRAVENOUS
  Filled 2018-05-10: qty 83.34

## 2018-05-10 MED ORDER — APIXABAN 5 MG PO TABS
5.0000 mg | ORAL_TABLET | Freq: Two times a day (BID) | ORAL | Status: DC
  Administered 2018-05-10 – 2018-05-13 (×6): 5 mg via ORAL
  Filled 2018-05-10 (×6): qty 1

## 2018-05-10 MED ORDER — AMIODARONE HCL IN DEXTROSE 360-4.14 MG/200ML-% IV SOLN
60.0000 mg/h | INTRAVENOUS | Status: AC
  Administered 2018-05-10 (×2): 60 mg/h via INTRAVENOUS
  Filled 2018-05-10 (×2): qty 200

## 2018-05-10 MED ORDER — AMIODARONE HCL IN DEXTROSE 360-4.14 MG/200ML-% IV SOLN
30.0000 mg/h | INTRAVENOUS | Status: DC
  Administered 2018-05-11 (×2): 30 mg/h via INTRAVENOUS
  Filled 2018-05-10: qty 200

## 2018-05-10 NOTE — Progress Notes (Signed)
PROGRESS NOTE    Antonio Carrillo  NID:[id number] DOB: [birth year] DOA: 04/25/2018 PCP: Patient, No Pcp Per   Brief Narrative:  HPI On 04/25/2018 by Dr. Barb Merino Antonio Carrillo is a 78 y.o. male with medical history significant of CLL, hypertension, hyperlipidemia, paroxysmal A. 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  Patient has extensive medical problems.  He has history of atrial fibrillation with multiple ablations, aortic root replacement with CABG, right ventricular heart failure and CLL.  Patient has been going through multiple diuretics to help with his leg swellings.  According to the patient, it was manageable until about a month now.  Patient stated that his legs are huge and edematous, they were skinny before Christmas.  Patient gained about 40 pounds of weight since 1 month.  He had some ascites in the past, however since last 1 month he had to have it tapped 3 times.  Patient also has persistent nausea but no vomiting.  Patient also has pain due to tightness of the legs.  He also injured his right leg at home and had to have sutures placed which are draining liquid around the suture lines.  Patient denies any chest pain or palpitation.  He is short of breath on exertion but none at rest.  He denies orthopnea or paroxysmal nocturnal dyspnea.  Patient recently on Bumex.  He went to see cardiologist today in the office, due to significant symptoms while sent to inpatient admission. Patient examined at the bedside.  He was on room air.  His blood pressures are stable.  WBC count is elevated, however chronic.  His potassium was 5.5, he is on Aldactone and potassium supplement.  His hemoglobin is more or less at baseline.  Creatinine is 2.69 which is significantly above his baseline of 1.4.  Interim history Admitted  with CHF exacerbation, cardiology/HF team currently consulted and following. Orthopedics also consulted. Patient developed spontaneous hematoma in LLE, eliquis held.  Assessment & Plan   Acute on chronic combined systolic and diastolic heart failure  -with hyponatremia, ascites and pedal edema -Cardiology and palliative care consulted and appreciated -Patient was on IV Lasix and transitioned to Bumex 3mg  BID -Was placed on a milrinone drip and currently weaning per cardiology recommendations -Monitor intake and output, daily weights -Echocardiogram 04/25/2018 showed normal LV systolic function, mild LVE, mild AI, mild  MR, severe biatrial enlargement, mild RVE with moderate RV dysfunction, severe TR  Paroxysmal atrial fibrillation -Patient has had multiple ablations and MAZE in the past -Cardiology currently following -Continue amiodarone -Eliquis on hold  LLE hematoma -spontaneous -LE doppler negative for DVT -CT LLE: No CT evidence of left lower extremity DVT.  Probable subcutaneous hematoma anterior laterally in the proximal left lower leg.  Generalized subcutaneous edema in the distal left lower leg, possibly cellulitis. -patient was briefly placed on vancomycin again on 2/5, however this is less likely infectious, will discontinue vancomycin (recent AKI) and monitor- patient also afebrile with improvement of leukocytosis -Orthopedics consulted and appreciated. Dr. Doreatha Martin able to aspirate 15cc serosanguinous fluid on 2/5.  -Discussed with Dr. Doreatha Martin, hold Eliquis- may restart on 05/10/2018. Continue ACE wrap to provide compression. No surgical intervention at this time. -will restart Eliquis pending cardiology recommendations  Acute kidney injury -On admission, creatinine was 2.5, baseline less than 1 -Creatinine currently 1.20 -Continue to monitor BMP  Hypokalemia -  Likely secondary to diuresis -Resolved -Continue to monitor BMP  Right foot wound/dorsal laceration with  leukocytosis -was seen by orthopedics at [facility] on 04/17/2018 and given Keflex -On 04/26/2018-  Patient noted to have swelling with weeping profusely and pedal edema -Orthopedic surgery consulted and appreciated, Dr. Doreatha Martin remove sutures and recommended antibiotics with doxycycline which is now been discontinued -Wound care consulted -Continue wet-to-dry dressings -Patient with continued erythema and swelling-cardiology started vancomycin on 05/02/2018- however discontinued on 05/07/2018  Anxiety/depression -Continue Zoloft, Ativan  Chronic anemia/iron deficiency anemia -hemoglobin currently 8.1 -hemoglobin has been ranging from 7-8 -suspect there will be a drop given hematoma -if needed, will discuss transfusion with cardiology- given volume overload -Continue to monitor CBC  CLL -Diagnosed in 2015 and followed at [place] note from 04/01/2018 recommends observation only after his treatment and repeat CT in 3 months  Painful mouth and throat -Has been a chronic issue for several years and patient has been undergoing treatment for CLL -Continue Magic mouthwash with lidocaine 4 times daily PRN -Consider outpatient ENT or GI evaluations -No evidence of thrush  OSA -Continue CPAP nightly  Gout -Continue allopurinol  Left knee pain with arthritis -Continue Voltaren gel  Elevated TSH -Likely sick euthyroid, free T4 normal  Constipation -Continue bowel regimen  Abnormal LFTs -Resolved  Hyponatremia -Continue to monitor, currently 131  Thrombocytopenia -stable, platelets 139 -Continue to monitor   Hyperglycemia -Hemoglobin A1c 6.1, continue insulin sliding scale, CBG monitoring  Goals of care and chronic pain -Patient currently DNR -Palliative care consulted and appreciate -given acute LLE pain, have changed Norco from scheduled BID to scheduled QID, with breakthrough morphine and hydrocodone -pain seems to be better  controlled  DVT Prophylaxis  Eliquis on hold given hematoma  Code Status:DNR  Family Communication: Family at bedside  Disposition Plan: Admitted. Dispo pending improvement and further recommendations from cardiology  Consultants Cardiology Palliative care Orthopedic surgery  Procedures  PICC line placement 04/25/2018 Suture removal 04/26/2018  Antibiotics   Anti-infectives (From admission, onward)   Start     Dose/Rate Route Frequency Ordered Stop   05/08/18 1800  vancomycin (VANCOCIN) 1,500 mg in sodium chloride 0.9 % 500 mL IVPB  Status:  Discontinued     1,500 mg 250 mL/hr over 120 Minutes Intravenous Every 24 hours 05/08/18 1713 05/09/18 0821   05/02/18 1400  vancomycin (VANCOCIN) 1,500 mg in sodium chloride 0.9 % 500 mL IVPB  Status:  Discontinued     1,500 mg 250 mL/hr over 120 Minutes Intravenous Every 24 hours 05/02/18 1232 05/07/18 0854   04/30/18 2200  doxycycline (VIBRA-TABS) tablet 100 mg     100 mg Oral Every 12 hours 04/30/18 1401 05/01/18 0808   04/26/18 1330  doxycycline (VIBRA-TABS) tablet 100 mg  Status:  Discontinued     100 mg Oral Every 12 hours 04/26/18 1242 04/30/18 1401   04/25/18 1300  terbinafine (LAMISIL) tablet 250 mg     250 mg Oral Daily 04/25/18 1143        Subjective:   Weslie Pretlow seen and examined today.  Feels better today than he has in weeks. Was able to sleep overnight. Feels pain is better controlled.  Feels his breathing has improved.  Denies current chest pain, abdominal pain, nausea or vomiting, dizziness or headache.    Objective:   Vitals:   05/09/18 2300 05/10/18 0420 05/10/18 0500 05/10/18 0908  BP: 109/60 (!) 107/59    Pulse: 70 64    Resp: 20  17    Temp: (!) 97.5 F (36.4 C) (!) 97.5 F (36.4 C)  97.6 F (36.4 C)  TempSrc: Oral Oral  Oral  SpO2: 96% 93%    Weight:   88.2 kg   Height:        Intake/Output Summary (Last 24 hours) at 05/10/2018 1151 Last data filed at 05/10/2018 1100 Gross per 24 hour  Intake 1477.62  ml  Output 2100 ml  Net -622.38 ml   Filed Weights   05/08/18 0339 05/09/18 0305 05/10/18 0500  Weight: 87.6 kg 87.8 kg 88.2 kg   Exam  General: Well developed, chronically ill-appearing, NAD  HEENT: NCAT,  mucous membranes moist.   Neck: Supple  Cardiovascular: S1 S2 auscultated, 2/6 SEM, RRR  Respiratory: Clear to auscultation bilaterally with equal chest rise  Abdomen: Soft, nontender, nondistended, + bowel sounds  Extremities: warm dry without cyanosis clubbing. B/L ACE wraps in place  Neuro: AAOx3, nonfocal  Psych: Pleasant, appropriate mood and affect  Data Reviewed: I have personally reviewed following labs and imaging studies  CBC: Recent Labs  Lab 05/04/18 0500 05/05/18 0408 05/06/18 0438 05/07/18 0448 05/08/18 0356 05/09/18 1130 05/10/18 0439  WBC 14.2* 18.2* 14.7* 12.5* 12.4* 18.2* 14.1*  NEUTROABS 11.3* 14.9* 12.1* 10.1* 9.9*  --   --   HGB 7.6* 8.0* 7.8* 8.2* 7.9* 7.7* 8.1*  HCT 25.4* 26.8* 26.5* 26.6* 26.0* 25.1* 26.4*  MCV 91.4 89.9 92.0 92.4 89.3 90.9 91.0  PLT 152 139* 145* 146* 145* 149* 295*   Basic Metabolic Panel: Recent Labs  Lab 05/04/18 0500 05/05/18 0408 05/06/18 0438 05/07/18 0448 05/08/18 0356 05/09/18 0413 05/10/18 0439  NA 131* 131* 133* 132* 132* 131* 131*  K 2.9* 3.9 3.9 4.6 4.6 4.8 4.6  CL 88* 89* 92* 89* 90* 91* 90*  CO2 30 29 30  32 34* 31 31  GLUCOSE 192* 173* 213* 227* 146* 152* 124*  BUN 41* 43* 53* 45* 44* 38* 43*  CREATININE 1.32* 1.31* 1.26* 1.12 1.19 1.20 1.20  CALCIUM 8.4* 8.7* 8.5* 8.7* 8.8* 8.5* 8.5*  MG 2.3 2.5* 2.3 2.2 2.2  --   --   PHOS 2.9 3.2 2.5 2.6 2.7  --   --    GFR: Estimated Creatinine Clearance: 53.2 mL/min (by C-G formula based on SCr of 1.2 mg/dL). Liver Function Tests: Recent Labs  Lab 05/05/18 0408 05/07/18 0448 05/08/18 0356 05/09/18 0413 05/10/18 0439  AST 27 24 26 28  33  ALT 29 25 24 23 26   ALKPHOS 126 148* 150* 141* 137*  BILITOT 0.8 0.5 0.8 1.0 0.9  PROT 5.3* 5.2* 5.4*  5.1* 5.2*  ALBUMIN 2.9* 2.9* 2.9* 2.7* 2.7*   No results for input(s): LIPASE, AMYLASE in the last 168 hours. No results for input(s): AMMONIA in the last 168 hours. Coagulation Profile: No results for input(s): INR, PROTIME in the last 168 hours. Cardiac Enzymes: No results for input(s): CKTOTAL, CKMB, CKMBINDEX, TROPONINI in the last 168 hours. BNP (last 3 results) No results for input(s): PROBNP in the last 8760 hours. HbA1C: Recent Labs    05/08/18 0356  HGBA1C 6.1*   CBG: No results for input(s): GLUCAP in the last 168 hours. Lipid Profile: No results for input(s): CHOL, HDL, LDLCALC, TRIG, CHOLHDL, LDLDIRECT in the last 72 hours. Thyroid Function Tests: No results for input(s): TSH, T4TOTAL, FREET4, T3FREE, THYROIDAB in the last 72 hours. Anemia Panel: No results for input(s): VITAMINB12, FOLATE, FERRITIN, TIBC, IRON, RETICCTPCT in the last 72 hours. Urine analysis: No  results found for: COLORURINE, APPEARANCEUR, LABSPEC, PHURINE, GLUCOSEU, HGBUR, BILIRUBINUR, KETONESUR, PROTEINUR, UROBILINOGEN, NITRITE, LEUKOCYTESUR Sepsis Labs: @LABRCNTIP (procalcitonin:4,lacticidven:4)  )No results found for this or any previous visit (from the past 240 hour(s)).    Radiology Studies: Ct Extremity Lower Left W Wo Contrast  Result Date: 05/08/2018 CLINICAL DATA:  Swelling and erythema in the calf with lower leg pain. Concern for DVT. EXAM: CT OF THE LOWER LEFT EXTREMITY WITH CONTRAST TECHNIQUE: Multidetector CT imaging of the lower left extremity was performed before and during bolus administration of intravenous contrast. The images extend from the lower lumbar spine into the left foot. COMPARISON:  Limited correlation made with chest CT 06/18/2014. CONTRAST:  190mL OMNIPAQUE IOHEXOL 300 MG/ML  SOLN FINDINGS: Bones/Joint/Cartilage The large field of view utilized for this examination limits detail. Patient is status post left total knee arthroplasty. No evidence of hardware loosening.  Ossific densities are noted posteromedial to the proximal fibula which do not appear acute. No definite acute fracture, dislocation or bone destruction. There are mild degenerative changes at the left hip. No significant arthropathy at the left ankle. Lower lumbar spondylosis noted with age-indeterminate inferior endplate deformity at L4. Ligaments Suboptimally assessed by CT. Muscles and Tendons There is well-circumscribed ossification within the mid hamstring musculature, most consistent with an old muscular injury. Similar ossification is noted within the proximal calf musculature, likely of the same etiology. The extensor mechanism is intact. The Achilles tendon appears intact. No intramuscular fluid collections are identified. Soft tissues Lenticular shaped, heterogeneous subcutaneous fluid collection is present anterolaterally in the proximal left lower leg. This measures 6.9 x 3.1 cm transverse on image 261/4 and extends approximately 16 cm in length. Appearance is most consistent with a subacute hematoma. No associated foreign body or soft tissue emphysema. There is generalized subcutaneous edema in the lower leg without other focal fluid collection. No focal filling defects are seen within the deep veins to suggest DVT. Vascular assessment at the knee is limited by artifact from the arthroplasty. Scattered arterial atherosclerosis noted. IMPRESSION: 1. No CT evidence of left lower extremity DVT. If that remains a clinical concern recommend evaluation with Doppler ultrasound. 2. Probable subcutaneous hematoma anterolaterally in the proximal left lower leg. 3. Generalized subcutaneous edema in the distal left lower leg, possibly cellulitis. 4. No acute osseous findings in the left lower extremity. Age-indeterminate L4 compression deformity. Electronically Signed   By: Richardean Sale M.D.   On: 05/08/2018 17:56   Vas [place] Lower Extremity Venous (dvt)  Result Date: 05/08/2018  Lower Venous Study Indications:  Swelling, and cellulitis.  Performing Technologist: Maudry Mayhew MHA, RDMS, RVT, RDCS  Examination Guidelines: A complete evaluation includes B-mode imaging, spectral Doppler, color Doppler, and power Doppler as needed of all accessible portions of each vessel. Bilateral testing is considered an integral part of a complete examination. Limited examinations for reoccurring indications may be performed as noted.  Right Venous Findings: +---+---------------+---------+-----------+----------+--------------+    CompressibilityPhasicitySpontaneityPropertiesSummary        +---+---------------+---------+-----------+----------+--------------+ CFVFull                    Yes                  Pulsatile flow +---+---------------+---------+-----------+----------+--------------+  Left Venous Findings: +---------+---------------+---------+-----------+----------+--------------+          CompressibilityPhasicitySpontaneityPropertiesSummary        +---------+---------------+---------+-----------+----------+--------------+ CFV      Full           No       Yes  Pulsatile flow +---------+---------------+---------+-----------+----------+--------------+ SFJ      Full                                                        +---------+---------------+---------+-----------+----------+--------------+ FV Prox  Full                                                        +---------+---------------+---------+-----------+----------+--------------+ FV Mid   Full                                                        +---------+---------------+---------+-----------+----------+--------------+ FV DistalFull                                                        +---------+---------------+---------+-----------+----------+--------------+ PFV      Full                                                         +---------+---------------+---------+-----------+----------+--------------+ POP      Full           No       Yes                  Pulsatile flow +---------+---------------+---------+-----------+----------+--------------+ PTV      Full                                                        +---------+---------------+---------+-----------+----------+--------------+ PERO                                                  Not visualized +---------+---------------+---------+-----------+----------+--------------+    Summary: Right: No evidence of common femoral vein obstruction. Left: There is no evidence of deep vein thrombosis in the lower extremity. However, portions of this examination were limited- see technologist comments above. No cystic structure found in the popliteal fossa. Pulsatile venous flow is suggestive of potentially elevated right-sided heart failure.  *See table(s) above for measurements and observations. Electronically signed by Curt Jews MD on 05/08/2018 at 9:42:00 PM.    Final      Scheduled Meds: . allopurinol  100 mg Oral Daily  . amiodarone  200 mg Oral BID  . bumetanide  3 mg Oral BID  . diclofenac sodium  2 g Topical QID  . feeding supplement (ENSURE ENLIVE)  237 mL Oral BID BM  .  HYDROcodone-acetaminophen  1 tablet Oral Q6H  . iron polysaccharides  150 mg Oral Daily  . LORazepam  0.5 mg Oral TID  . multivitamin with minerals  1 tablet Oral Daily  . polyethylene glycol  17 g Oral BID  . senna-docusate  1 tablet Oral BID  . sertraline  200 mg Oral Daily  . sodium chloride flush  10-40 mL Intracatheter Q12H  . spironolactone  50 mg Oral BID  . terbinafine  250 mg Oral Daily   Continuous Infusions: . milrinone 0.25 mcg/kg/min (05/10/18 1012)     LOS: 15 days   Time Spent in minutes   30 minutes  Mckensi Redinger D.O. on 05/10/2018 at 11:51 AM  Between 7am to 7pm - Please see pager noted on [email or web address]  After 7pm go to [email or web address]  And look for the  night coverage person covering for me after hours  [facility] Office  [phone]

## 2018-05-10 NOTE — Progress Notes (Signed)
Was the fall witnessed: Yes  Patient condition before and after the fall: Pt has had C/O pain in the L calf area, no C/O pain after fall, did note a skin tear to the L upper arm and some scratches on his back.  Patient's reaction to the fall: Pt states he missed the toilet and went down mainly on his back.  He is not complaining of any pain.    Name of the doctor that was notified including date and time: Dr. Ree Kida was notified at 1437 05/10/2018  Any interventions and vital signs: Pt placed safely back in bed.  Foam dressing was placed to his skin tear on his L upper arm.  Vital signs were taken and are documented in flowsheets.  [place]

## 2018-05-10 NOTE — Progress Notes (Signed)
  Amiodarone Drug - Drug Interaction Consult Note  Recommendations: -Monitor for hypokalemia and increased risk of cardiotoxicity on Bumex -Monitor QTc Amiodarone is metabolized by the cytochrome P450 system and therefore has the potential to cause many drug interactions. Amiodarone has an average plasma half-life of 50 days (range 20 to 100 days).   There is potential for drug interactions to occur several weeks or months after stopping treatment and the onset of drug interactions may be slow after initiating amiodarone.   []  Statins: Increased risk of myopathy. Simvastatin- restrict dose to 20mg  daily. Other statins: counsel patients to report any muscle pain or weakness immediately.  []  Anticoagulants: Amiodarone can increase anticoagulant effect. Consider warfarin dose reduction. Patients should be monitored closely and the dose of anticoagulant altered accordingly, remembering that amiodarone levels take several weeks to stabilize.  []  Antiepileptics: Amiodarone can increase plasma concentration of phenytoin, the dose should be reduced. Note that small changes in phenytoin dose can result in large changes in levels. Monitor patient and counsel on signs of toxicity.  []  Beta blockers: increased risk of bradycardia, AV block and myocardial depression. Sotalol - avoid concomitant use.  []   Calcium channel blockers (diltiazem and verapamil): increased risk of bradycardia, AV block and myocardial depression.  []   Cyclosporine: Amiodarone increases levels of cyclosporine. Reduced dose of cyclosporine is recommended.  []  Digoxin dose should be halved when amiodarone is started.  [x]  Diuretics: increased risk of cardiotoxicity if hypokalemia occurs.  []  Oral hypoglycemic agents (glyburide, glipizide, glimepiride): increased risk of hypoglycemia. Patient's glucose levels should be monitored closely when initiating amiodarone therapy.   [x]  Drugs that prolong the QT interval:  Torsades de  pointes risk may be increased with concurrent use - avoid if possible.  Monitor QTc, also keep magnesium/potassium WNL if concurrent therapy can't be avoided. Marland Kitchen Antibiotics: e.g. fluoroquinolones, erythromycin. . Antiarrhythmics: e.g. quinidine, procainamide, disopyramide, sotalol. . Antipsychotics: e.g. phenothiazines, haloperidol.  . Lithium, tricyclic antidepressants, and methadone. Thank You,  Elicia Lamp P  05/10/2018 6:06 PM

## 2018-05-10 NOTE — Care Management Important Message (Signed)
Important Message  Patient Details  Name: Antonio Carrillo MRN: [id number] Date of Birth: [birth year]   Medicare Important Message Given:  Yes    Risha Barretta P [place] 05/10/2018, 1:36 PM

## 2018-05-10 NOTE — Progress Notes (Signed)
Physical Therapy Treatment Patient Details Name: Antonio Carrillo MRN: [id number] DOB: 26-Sep-1940 Today's Date: 05/10/2018    History of Present Illness Macguire Holsinger is a 78 y.o. male with medical history significant of CLL, hypertension, hyperlipidemia, paroxysmal A. 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 Was started on a Milrinone gtt and getting diuresed with Advanced Heart Team Following. During the hospitalization his Left Leg developed  new warmth and Erythema and ? Venous Stasis changes vs. Cellulitis but Cardiology started the patient on IV Vancomycin.    PT Comments    Pt was able to walk on the hallway today with no rest, O2 sat from 98% baseline to 99% post gait using a RW.  He is accompanied by his wife, who is very attentive to all his information.  Pt is making excellent progress to be on track for home, and will anticipate his quick resolution to independent gait from there.  Follow acutely for safety with balance and gait and ck's of O2 sats, pulses and monitoring of pain to ensure safety with all mobility.    Follow Up Recommendations  Home health PT;Supervision/Assistance - 24 hour     Equipment Recommendations  None recommended by PT    Recommendations for Other Services       Precautions / Restrictions Precautions Precautions: Fall Restrictions Weight Bearing Restrictions: No    Mobility  Bed Mobility Overal bed mobility: Needs Assistance Bed Mobility: Supine to Sit;Sit to Supine     Supine to sit: Supervision Sit to supine: Supervision      Transfers Overall transfer level: Needs assistance Equipment used: Rolling walker (2 wheeled) Transfers: Sit to/from Stand Sit to Stand: Min guard         General transfer comment: monitoring of lines  primarily  Ambulation/Gait Ambulation/Gait assistance: Supervision;Min guard Gait Distance (Feet): 275 Feet Assistive device: Rolling walker (2 wheeled) Gait Pattern/deviations: Step-through pattern;Decreased stride length;Wide base of support(better upright posture) Gait velocity: reduced Gait velocity interpretation: <1.31 ft/sec, indicative of household ambulator     Marine scientist [place] (Stroke Patients Only)       Balance Overall balance assessment: Needs assistance Sitting-balance support: Feet supported Sitting balance-Leahy Scale: Good     Standing balance support: Bilateral upper extremity supported Standing balance-Leahy Scale: Fair                              Cognition Arousal/Alertness: Awake/alert Behavior During Therapy: WFL for tasks assessed/performed Overall Cognitive Status: Within Functional Limits for tasks assessed                                        Exercises      General Comments General comments (skin integrity, edema, etc.): pt is not requiring standing rests today, and turns were more fluid on RW      Pertinent Vitals/Pain Pain Assessment: 0-10 Pain Score: 2  Pain Location: B lower legs Pain Descriptors / Indicators: Sore Pain Intervention(s): Monitored during session;Premedicated before session;Patient requesting pain meds-RN notified    Home Living  Prior Function            PT Goals (current goals can now be found in the care plan section) Acute Rehab PT Goals Patient Stated Goal: to walk and go home Progress towards PT goals: Progressing toward goals    Frequency    Min 3X/week      PT Plan Current plan remains appropriate    Co-evaluation              AM-PAC PT "6 Clicks" Mobility   Outcome Measure  Help needed turning from your back to your side while in a flat bed without using bedrails?: None Help  needed moving from lying on your back to sitting on the side of a flat bed without using bedrails?: None Help needed moving to and from a bed to a chair (including a wheelchair)?: A Little Help needed standing up from a chair using your arms (e.g., wheelchair or bedside chair)?: A Little Help needed to walk in hospital room?: A Little Help needed climbing 3-5 steps with a railing? : A Lot 6 Click Score: 19    End of Session Equipment Utilized During Treatment: Gait belt Activity Tolerance: Patient limited by fatigue Patient left: with call bell/phone within reach;in bed;with family/visitor present Nurse Communication: Mobility status PT Visit Diagnosis: Unsteadiness on feet (R26.81);Muscle weakness (generalized) (M62.81)     Time: 4270-6237 PT Time Calculation (min) (ACUTE ONLY): 23 min  Charges:  $Gait Training: 8-22 mins $Therapeutic Activity: 8-22 mins                   Ramond Dial 05/10/2018, 12:10 PM  Mee Hives, PT MS Acute Rehab Dept. Number: [place] [facility] [place]

## 2018-05-10 NOTE — Progress Notes (Addendum)
Patient ID: Antonio Carrillo, male   DOB: Feb 27, 1941, 78 y.o.   MRN: [id number]     Advanced Heart Failure Rounding Note  PCP-Cardiologist: No primary care provider on file.   Subjective:    Admitted from clinic 04/25/2018 with marked volume overloaded and abdominal distention.   Pt had acute LLE pain yesterday afternoon. LLE duplex showed no DVT. CT of LLE showed probable SQ hematoma in proximal LLE and general SQ edema, possibly cellulitis. Eliquis, lasix, spiro, metolazone, and potassium were stopped.   Ortho consulted. Aspirated 15 cc of serosanguinous fluid. Recommended holding Eliquis until tomorrow. No s/s compartment syndrome. No role for surgical intervention. Continue compressive wrap.  Coox 81.3% on milrinone 0.375 mcg/kg/min. Started back on po bumex. CVP 5-6, sitting on edge of bed, re-zeroed and positioned personally.   Feeling OK this am. I/O nearly even. Appetite improved, ate good solid meal yesterday for first time in weeks. He states this is the best he has felt in months.   Echo 04/25/2018: LV EF 55-60%, D-shaped interventricular septum with RV enlargement and dysfunction, severe TR.   Objective:   Weight Range: 88.2 kg Body mass index is 27.12 kg/m.   Vital Signs:   Temp:  [97.5 F (36.4 C)-98.2 F (36.8 C)] 97.6 F (36.4 C) (02/07 0908) Pulse Rate:  [61-70] 64 (02/07 0420) Resp:  [14-20] 17 (02/07 0420) BP: (101-112)/(56-65) 107/59 (02/07 0420) SpO2:  [93 %-99 %] 93 % (02/07 0420) Weight:  [88.2 kg] 88.2 kg (02/07 0500) Last BM Date: 05/07/18(per pt and daughter report)  Weight change: Filed Weights   05/08/18 0339 05/09/18 0305 05/10/18 0500  Weight: 87.6 kg 87.8 kg 88.2 kg   Intake/Output:   Intake/Output Summary (Last 24 hours) at 05/10/2018 0925 Last data filed at 05/10/2018 0848 Gross per 24 hour  Intake 1467.62 ml  Output 1675 ml  Net -207.38 ml    Physical Exam   General: NAD  HEENT: Normal Neck: Supple. JVP 5-6 cm. Carotids 2+ bilat; no  bruits. No thyromegaly or nodule noted. Cor: PMI nondisplaced. RRR, 2/6 TR, + RV lift Lungs: CTAB, normal effort. Abdomen: Soft, non-tender, + distended, no HSM. No bruits or masses. +BS  Extremities: No cyanosis, clubbing, or rash. R and LLE no edema. BLE ace wrapped. RUE DL PICC Neuro: Alert & orientedx3, cranial nerves grossly intact. moves all 4 extremities w/o difficulty. Affect pleasant   Telemetry   NSR 60-70s, personally reviewed.  Labs    CBC Recent Labs    05/08/18 0356 05/09/18 1130 05/10/18 0439  WBC 12.4* 18.2* 14.1*  NEUTROABS 9.9*  --   --   HGB 7.9* 7.7* 8.1*  HCT 26.0* 25.1* 26.4*  MCV 89.3 90.9 91.0  PLT 145* 149* 938*   Basic Metabolic Panel Recent Labs    05/08/18 0356 05/09/18 0413 05/10/18 0439  NA 132* 131* 131*  K 4.6 4.8 4.6  CL 90* 91* 90*  CO2 34* 31 31  GLUCOSE 146* 152* 124*  BUN 44* 38* 43*  CREATININE 1.19 1.20 1.20  CALCIUM 8.8* 8.5* 8.5*  MG 2.2  --   --   PHOS 2.7  --   --    Liver Function Tests Recent Labs    05/09/18 0413 05/10/18 0439  AST 28 33  ALT 23 26  ALKPHOS 141* 137*  BILITOT 1.0 0.9  PROT 5.1* 5.2*  ALBUMIN 2.7* 2.7*   No results for input(s): LIPASE, AMYLASE in the last 72 hours. Cardiac Enzymes No results for input(s):  CKTOTAL, CKMB, CKMBINDEX, TROPONINI in the last 72 hours.  BNP: BNP (last 3 results) Recent Labs    04/25/18 0949  BNP 1,544.3*    ProBNP (last 3 results) No results for input(s): PROBNP in the last 8760 hours.   D-Dimer No results for input(s): DDIMER in the last 72 hours. Hemoglobin A1C Recent Labs    05/08/18 0356  HGBA1C 6.1*   Fasting Lipid Panel No results for input(s): CHOL, HDL, LDLCALC, TRIG, CHOLHDL, LDLDIRECT in the last 72 hours. Thyroid Function Tests No results for input(s): TSH, T4TOTAL, T3FREE, THYROIDAB in the last 72 hours.  Invalid input(s): FREET3  Other results:   Imaging    No results found.   Medications:     Scheduled  Medications: . allopurinol  100 mg Oral Daily  . amiodarone  200 mg Oral BID  . bumetanide  3 mg Oral BID  . diclofenac sodium  2 g Topical QID  . feeding supplement (ENSURE ENLIVE)  237 mL Oral BID BM  . HYDROcodone-acetaminophen  1 tablet Oral Q6H  . iron polysaccharides  150 mg Oral Daily  . LORazepam  0.5 mg Oral TID  . multivitamin with minerals  1 tablet Oral Daily  . polyethylene glycol  17 g Oral BID  . senna-docusate  1 tablet Oral BID  . sertraline  200 mg Oral Daily  . sodium chloride flush  10-40 mL Intracatheter Q12H  . spironolactone  50 mg Oral BID  . terbinafine  250 mg Oral Daily    Infusions: . milrinone 0.375 mcg/kg/min (05/10/18 0743)    PRN Medications: benzocaine, bisacodyl, HYDROcodone-acetaminophen, lidocaine, magic mouthwash w/lidocaine, morphine injection, ondansetron **OR** ondansetron (ZOFRAN) IV, prochlorperazine, sodium chloride flush, white petrolatum  Patient Profile   Antonio Carrillo is a 78 y.o. male with h/o AF s/p several ablations, aortic root aneurysm, s/p aortic root replacement with CABG 1, RV failure, CLL, and ascites.   Sent for admission from HF clinic 04/25/2018 with marked volume overload on exam, RLE wound, and marked ascites.   Assessment/Plan   1. 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 [facility] 4/18 [facility] with normal LV function. RV severely dilated and mild RV dysfunction.  Echo this admission with LV EF 55-60%, RV dilated and dysfunctional, severe TR, D-shaped septum.  - Co-ox 81 % this am on milrinrone 0.375 mcg/kg/min. Decrease milrinone to 0.25 and repeat pm coox.  - Weight down 29 lbs total.  - Continue bumex 3 mg BID. Clarified with family. They state he was taking 4 mg BID PTA, but had been on 3 before that, had only recently been increased.  - Continue spironolactone 50 mg BID.  - Long-term prognosis is quite poor.  With RV failure, not candidate for LVAD  and also would be poor HD candidate. Palliative Care following. He has been resistant to [place] discussions. He is hopeful that he may get some recovery. He was very satisfied with his QOL recently and would like to get back to that. We will continue inotrope support and diuresis.  2. PAF: S/p multiple ablations and MAZE.  - Remains in NSR.    - Continue amiodarone 200 mg BID while on milrinone.   - Now off digoxin. Creatinine improving maybe able to restart soon. Creatinine stable at 1.2 today.  - Eliquis on hold now with hematoma. Hgb 8.1 today.  3. CLL: Per Oncology.  Now s/p pelvic XRT. No change.  4.  CAD and CABG x 1:  - No s/s of ischemia.    - No ASA given apixaban use.     5. Aortic root aneurysm s/p repair  6. RLE laceration: Appreciate WOC and Ortho input. No fever or chills.  - Management per Ortho. Appreciate their input. No change.  7. Iron deficiency Anemia: No overt bleeding. Apixiban now on hold.  - With marked volume overload, would not transfuse until Hgb < 7. No CBC today. Hgb 8.1.    - Transferrin saturation low.  - Received feraheme 11/25.  8. Ascites: Persists.  S/p 2 paracenteses recently with 4.1 and 1.3 L out. 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 Cytology from paracentesis has repeatedly been negative for malignancy - Consider repeat paracentesis as needed. No change.   9. Hyponatremia:  - Sodium dropped to 119. Improved after a couple doses of  tolvaptan.  - Sodium stable at 131.  - Continue free water restrict.  10. Hypokalemia - Potassium stabilized now. K 4.6.  11. Hypotension - Stable. SBPs OK.  - Consider midodrine as needed 12. LLE cellulitis  - Improving. Finished Vancomycin.  No change.  13. ? 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 No change.  14. LLE hematoma - Ortho following. Aspirated 15 cc serosanguinous fluid yesterday. Eliquis on hold. Recommended holding Eliquis  until tomorrow. No role for surgery. Continue compressive wrap. - Hgb stable 8.1.  - Pain much improved on current regimen.   Shirley Friar, PA-C  05/10/2018 9:25 AM   Advanced Heart Failure Team Pager [phone] (M-F; 7a - 4p)  Please contact [facility] for night-coverage after hours (4p -7a ) and weekends on [email or web address]   Patient seen and examined with the above-signed Advanced Practice Provider and/or Housestaff. I personally reviewed laboratory data, imaging studies and relevant notes. I independently examined the patient and formulated the important aspects of the plan. I have edited the note to reflect any of my changes or salient points. I have personally discussed the plan with the patient and/or family.  Feeling better today. Milrinone turned down to 0.375 yesterday. Co-ox 81% (?accuracy). CVP down to 7-8. Now back in AF. In looking at tele AF began about 10p last night. LLE hematoma is stable. Eliquis has been on hold since Wednesday night. Hgb stable. Weight stable back on po diuretics.   Will wean milrinone down to 0.25. Continue bumex. Be careful not to overdiurese. If co-ox stable, wean milrinone to 0.125 tomorrow. Will start IV amio, Restart Eliquis for possible DC-CV. Watch LLE closely.   Glori Bickers, MD  5:37 PM

## 2018-05-11 ENCOUNTER — Inpatient Hospital Stay (HOSPITAL_COMMUNITY): Payer: Medicare Other

## 2018-05-11 LAB — CBC
HCT: 26.8 % — ABNORMAL LOW (ref 39.0–52.0)
Hemoglobin: 8.2 g/dL — ABNORMAL LOW (ref 13.0–17.0)
MCH: 27.4 pg (ref 26.0–34.0)
MCHC: 30.6 g/dL (ref 30.0–36.0)
MCV: 89.6 fL (ref 80.0–100.0)
Platelets: 146 10*3/uL — ABNORMAL LOW (ref 150–400)
RBC: 2.99 MIL/uL — AB (ref 4.22–5.81)
RDW: 16.7 % — ABNORMAL HIGH (ref 11.5–15.5)
WBC: 12.9 10*3/uL — ABNORMAL HIGH (ref 4.0–10.5)
nRBC: 0 % (ref 0.0–0.2)

## 2018-05-11 LAB — COMPREHENSIVE METABOLIC PANEL
ALBUMIN: 2.6 g/dL — AB (ref 3.5–5.0)
ALT: 24 U/L (ref 0–44)
ANION GAP: 14 (ref 5–15)
AST: 30 U/L (ref 15–41)
Alkaline Phosphatase: 145 U/L — ABNORMAL HIGH (ref 38–126)
BUN: 32 mg/dL — ABNORMAL HIGH (ref 8–23)
CO2: 30 mmol/L (ref 22–32)
Calcium: 8.5 mg/dL — ABNORMAL LOW (ref 8.9–10.3)
Chloride: 87 mmol/L — ABNORMAL LOW (ref 98–111)
Creatinine, Ser: 1.07 mg/dL (ref 0.61–1.24)
GFR calc Af Amer: >60 mL/min (ref >60)
GFR calc non Af Amer: >60 mL/min (ref >60)
Glucose, Bld: 165 mg/dL — ABNORMAL HIGH (ref 70–99)
POTASSIUM: 3.6 mmol/L (ref 3.5–5.1)
Sodium: 131 mmol/L — ABNORMAL LOW (ref 135–145)
Total Bilirubin: 0.7 mg/dL (ref 0.3–1.2)
Total Protein: 5 g/dL — ABNORMAL LOW (ref 6.5–8.1)

## 2018-05-11 LAB — COOXEMETRY PANEL
CARBOXYHEMOGLOBIN: 2 % — AB (ref 0.5–1.5)
Methemoglobin: 1.4 % (ref 0.0–1.5)
O2 SAT: 52.9 %
Total hemoglobin: 8.4 g/dL — ABNORMAL LOW (ref 12.0–16.0)

## 2018-05-11 MED ORDER — AMIODARONE HCL 200 MG PO TABS
400.0000 mg | ORAL_TABLET | Freq: Two times a day (BID) | ORAL | Status: DC
  Administered 2018-05-11 – 2018-05-15 (×9): 400 mg via ORAL
  Filled 2018-05-11 (×10): qty 2

## 2018-05-11 MED ORDER — LACTULOSE ENEMA: 300.0000 mL | Freq: Once | ORAL | Status: DC

## 2018-05-11 MED ORDER — LACTULOSE 10 GM/15ML PO SOLN
20.0000 g | Freq: Once | ORAL | Status: AC
  Administered 2018-05-11: 20 g via ORAL
  Filled 2018-05-11: qty 30

## 2018-05-11 MED ORDER — POTASSIUM CHLORIDE CRYS ER 20 MEQ PO TBCR
40.0000 meq | EXTENDED_RELEASE_TABLET | Freq: Once | ORAL | Status: AC
  Administered 2018-05-11: 40 meq via ORAL
  Filled 2018-05-11: qty 2

## 2018-05-11 NOTE — Progress Notes (Signed)
Pt doesn't use CPAP at home. Pt refuses to use CPAP for the night.

## 2018-05-11 NOTE — Progress Notes (Signed)
PROGRESS NOTE    Antonio Carrillo  GGE:[id number] DOB: 03-18-1941 DOA: 04/25/2018 PCP: Patient, No Pcp Per   Brief Narrative:  HPI On 04/25/2018 by Dr. Barb Merino Antonio Carrillo is a 78 y.o. male with medical history significant of CLL, hypertension, hyperlipidemia, paroxysmal A. 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  Patient has extensive medical problems.  He has history of atrial fibrillation with multiple ablations, aortic root replacement with CABG, right ventricular heart failure and CLL.  Patient has been going through multiple diuretics to help with his leg swellings.  According to the patient, it was manageable until about a month now.  Patient stated that his legs are huge and edematous, they were skinny before Christmas.  Patient gained about 40 pounds of weight since 1 month.  He had some ascites in the past, however since last 1 month he had to have it tapped 3 times.  Patient also has persistent nausea but no vomiting.  Patient also has pain due to tightness of the legs.  He also injured his right leg at home and had to have sutures placed which are draining liquid around the suture lines.  Patient denies any chest pain or palpitation.  He is short of breath on exertion but none at rest.  He denies orthopnea or paroxysmal nocturnal dyspnea.  Patient recently on Bumex.  He went to see cardiologist today in the office, due to significant symptoms while sent to inpatient admission. Patient examined at the bedside.  He was on room air.  His blood pressures are stable.  WBC count is elevated, however chronic.  His potassium was 5.5, he is on Aldactone and potassium supplement.  His hemoglobin is more or less at baseline.  Creatinine is 2.69 which is significantly above his baseline of 1.4.  Interim history Admitted  with CHF exacerbation, cardiology/HF team currently consulted and following. Orthopedics also consulted. Patient developed spontaneous hematoma in LLE, eliquis was held.  Assessment & Plan   Acute on chronic combined systolic and diastolic heart failure  -with hyponatremia, ascites and pedal edema -Cardiology and palliative care consulted and appreciated -Patient was on IV Lasix and transitioned to Bumex 3mg  BID -Was placed on a milrinone drip and currently weaning per cardiology recommendations -Monitor intake and output, daily weights -Echocardiogram 04/25/2018 showed normal LV systolic function, mild LVE, mild AI, mild  MR, severe biatrial enlargement, mild RVE with moderate RV dysfunction, severe TR  Paroxysmal atrial fibrillation -Patient has had multiple ablations and MAZE in the past -Cardiology currently following -Continue amiodarone -Eliquis was briefly held due to hematoma- has been restarted  LLE hematoma -spontaneous -LE doppler negative for DVT -CT LLE: No CT evidence of left lower extremity DVT.  Probable subcutaneous hematoma anterior laterally in the proximal left lower leg.  Generalized subcutaneous edema in the distal left lower leg, possibly cellulitis. -patient was briefly placed on vancomycin again on 2/5, however this is less likely infectious, will discontinue vancomycin (recent AKI) and monitor- patient also afebrile with improvement of leukocytosis -Orthopedics consulted and appreciated. Dr. Doreatha Martin able to aspirate 15cc serosanguinous fluid on 2/5.  -Discussed with Dr. Doreatha Martin, hold Eliquis- may restart on 05/10/2018. Continue ACE wrap to provide compression. No surgical intervention at this time.  Acute kidney injury -On admission, creatinine was 2.5, baseline less than 1 -Creatinine currently 1.07 -Continue to monitor BMP  Hypokalemia -Likely secondary to diuresis -Resolved -Continue to monitor BMP  Right foot wound/dorsal laceration with  leukocytosis -was seen by orthopedics at [facility] on 04/17/2018 and given Keflex -On 04/26/2018-  Patient noted to have swelling with weeping profusely and pedal edema -Orthopedic surgery consulted and appreciated, Dr. Doreatha Martin remove sutures and recommended antibiotics with doxycycline which is now been discontinued -Wound care consulted -Continue wet-to-dry dressings -Patient with continued erythema and swelling-cardiology started vancomycin on 05/02/2018- however discontinued on 05/07/2018  Anxiety/depression -Continue Zoloft, Ativan  Chronic anemia/iron deficiency anemia -hemoglobin currently 8.2 -hemoglobin has been ranging from 7-8 -suspect there will be a drop given hematoma -if needed, will discuss transfusion with cardiology- given volume overload -Continue to monitor CBC  CLL -Diagnosed in 2015 and followed at [place] note from 04/01/2018 recommends observation only after his treatment and repeat CT in 3 months  Painful mouth and throat -Has been a chronic issue for several years and patient has been undergoing treatment for CLL -Continue Magic mouthwash with lidocaine 4 times daily PRN -Consider outpatient ENT or GI evaluations -No evidence of thrush  OSA -Continue CPAP nightly  Gout -Continue allopurinol  Left knee pain with arthritis -Continue Voltaren gel  Elevated TSH -Likely sick euthyroid, free T4 normal  Constipation -Continue bowel regimen  -added enema today   Abnormal LFTs -Resolved  Hyponatremia -Continue to monitor, currently 131  Thrombocytopenia -stable, platelets 146 -Continue to monitor   Hyperglycemia -Hemoglobin A1c 6.1, continue insulin sliding scale, CBG monitoring  Goals of care and chronic pain -Patient currently DNR -Palliative care consulted and appreciate -given acute LLE pain, have changed Norco from scheduled BID to scheduled QID, with breakthrough morphine and hydrocodone -pain seems to be  better controlled  DVT Prophylaxis  Eliquis on hold given hematoma  Code Status:DNR  Family Communication: Family at bedside  Disposition Plan: Admitted. Dispo pending improvement and further recommendations from cardiology  Consultants Cardiology Palliative care Orthopedic surgery  Procedures  PICC line placement 04/25/2018 Suture removal 04/26/2018  Antibiotics   Anti-infectives (From admission, onward)   Start     Dose/Rate Route Frequency Ordered Stop   05/08/18 1800  vancomycin (VANCOCIN) 1,500 mg in sodium chloride 0.9 % 500 mL IVPB  Status:  Discontinued     1,500 mg 250 mL/hr over 120 Minutes Intravenous Every 24 hours 05/08/18 1713 05/09/18 0821   05/02/18 1400  vancomycin (VANCOCIN) 1,500 mg in sodium chloride 0.9 % 500 mL IVPB  Status:  Discontinued     1,500 mg 250 mL/hr over 120 Minutes Intravenous Every 24 hours 05/02/18 1232 05/07/18 0854   04/30/18 2200  doxycycline (VIBRA-TABS) tablet 100 mg     100 mg Oral Every 12 hours 04/30/18 1401 05/01/18 0808   04/26/18 1330  doxycycline (VIBRA-TABS) tablet 100 mg  Status:  Discontinued     100 mg Oral Every 12 hours 04/26/18 1242 04/30/18 1401   04/25/18 1300  terbinafine (LAMISIL) tablet 250 mg     250 mg Oral Daily 04/25/18 1143        Subjective:   Ermon Sagan seen and examined today.  Patient has no complaints today other than constipation.  Continues to have some mild leg pain but feels it is improved with this pain regimen.  Denies current chest pain, abdominal pain, nausea or vomiting, dizziness or headache.  Feels breathing has been improving. Objective:   Vitals:   05/11/18 0200 05/11/18 0522 05/11/18 0618 05/11/18 0717  BP: 109/62 112/62  Pulse:  66    Resp:  17 14 20   Temp:  (!) 97.5 F (36.4 C)    TempSrc:  Oral    SpO2:  94%    Weight:   88.3 kg 88 kg  Height:        Intake/Output Summary (Last 24 hours) at 05/11/2018 1139 Last data filed at 05/11/2018 6967 Gross per 24 hour  Intake 72.8 ml   Output 2225 ml  Net -2152.2 ml   Filed Weights   05/10/18 0500 05/11/18 0618 05/11/18 0717  Weight: 88.2 kg 88.3 kg 88 kg   Exam  General: Well developed, chronically ill-appearing, NAD  HEENT: NCAT, mucous membranes moist.   Neck: Supple  Cardiovascular: S1 S2 auscultated, 2/6SEM, RRR  Respiratory: Clear to auscultation bilaterally with equal chest rise  Abdomen: Soft, nontender, nondistended, + bowel sounds  Extremities: warm dry without cyanosis clubbing. LLE edema. LE in ACE wraps  Neuro: AAOx3, nonfocal  Psych: Pleasant, appropriate mood and affect  Data Reviewed: I have personally reviewed following labs and imaging studies  CBC: Recent Labs  Lab 05/05/18 0408 05/06/18 0438 05/07/18 0448 05/08/18 0356 05/09/18 1130 05/10/18 0439 05/11/18 0812  WBC 18.2* 14.7* 12.5* 12.4* 18.2* 14.1* 12.9*  NEUTROABS 14.9* 12.1* 10.1* 9.9*  --   --   --   HGB 8.0* 7.8* 8.2* 7.9* 7.7* 8.1* 8.2*  HCT 26.8* 26.5* 26.6* 26.0* 25.1* 26.4* 26.8*  MCV 89.9 92.0 92.4 89.3 90.9 91.0 89.6  PLT 139* 145* 146* 145* 149* 139* 893*   Basic Metabolic Panel: Recent Labs  Lab 05/05/18 0408 05/06/18 0438 05/07/18 0448 05/08/18 0356 05/09/18 0413 05/10/18 0439 05/11/18 0812  NA 131* 133* 132* 132* 131* 131* 131*  K 3.9 3.9 4.6 4.6 4.8 4.6 3.6  CL 89* 92* 89* 90* 91* 90* 87*  CO2 29 30 32 34* 31 31 30   GLUCOSE 173* 213* 227* 146* 152* 124* 165*  BUN 43* 53* 45* 44* 38* 43* 32*  CREATININE 1.31* 1.26* 1.12 1.19 1.20 1.20 1.07  CALCIUM 8.7* 8.5* 8.7* 8.8* 8.5* 8.5* 8.5*  MG 2.5* 2.3 2.2 2.2  --   --   --   PHOS 3.2 2.5 2.6 2.7  --   --   --    GFR: Estimated Creatinine Clearance: 59.6 mL/min (by C-G formula based on SCr of 1.07 mg/dL). Liver Function Tests: Recent Labs  Lab 05/07/18 0448 05/08/18 0356 05/09/18 0413 05/10/18 0439 05/11/18 0812  AST 24 26 28  33 30  ALT 25 24 23 26 24   ALKPHOS 148* 150* 141* 137* 145*  BILITOT 0.5 0.8 1.0 0.9 0.7  PROT 5.2* 5.4* 5.1*  5.2* 5.0*  ALBUMIN 2.9* 2.9* 2.7* 2.7* 2.6*   No results for input(s): LIPASE, AMYLASE in the last 168 hours. No results for input(s): AMMONIA in the last 168 hours. Coagulation Profile: No results for input(s): INR, PROTIME in the last 168 hours. Cardiac Enzymes: No results for input(s): CKTOTAL, CKMB, CKMBINDEX, TROPONINI in the last 168 hours. BNP (last 3 results) No results for input(s): PROBNP in the last 8760 hours. HbA1C: No results for input(s): HGBA1C in the last 72 hours. CBG: No results for input(s): GLUCAP in the last 168 hours. Lipid Profile: No results for input(s): CHOL, HDL, LDLCALC, TRIG, CHOLHDL, LDLDIRECT in the last 72 hours. Thyroid Function Tests: No results for input(s): TSH, T4TOTAL, FREET4, T3FREE, THYROIDAB in the last 72 hours. Anemia Panel: No results for input(s): VITAMINB12, FOLATE, FERRITIN, TIBC, IRON, RETICCTPCT in the last  72 hours. Urine analysis: No results found for: COLORURINE, APPEARANCEUR, LABSPEC, PHURINE, GLUCOSEU, HGBUR, BILIRUBINUR, KETONESUR, PROTEINUR, UROBILINOGEN, NITRITE, LEUKOCYTESUR Sepsis Labs: @LABRCNTIP (procalcitonin:4,lacticidven:4)  )No results found for this or any previous visit (from the past 240 hour(s)).    Radiology Studies: No results found.   Scheduled Meds: . allopurinol  100 mg Oral Daily  . amiodarone  400 mg Oral BID  . apixaban  5 mg Oral BID  . bumetanide  3 mg Oral BID  . diclofenac sodium  2 g Topical QID  . feeding supplement (ENSURE ENLIVE)  237 mL Oral BID BM  . HYDROcodone-acetaminophen  1 tablet Oral Q6H  . iron polysaccharides  150 mg Oral Daily  . LORazepam  0.5 mg Oral TID  . multivitamin with minerals  1 tablet Oral Daily  . polyethylene glycol  17 g Oral BID  . senna-docusate  1 tablet Oral BID  . sertraline  200 mg Oral Daily  . sodium chloride flush  10-40 mL Intracatheter Q12H  . spironolactone  50 mg Oral BID  . terbinafine  250 mg Oral Daily   Continuous Infusions: . milrinone  0.125 mcg/kg/min (05/11/18 1125)     LOS: 16 days   Time Spent in minutes   30 minutes  Manus Weedman D.O. on 05/11/2018 at 11:39 AM  Between 7am to 7pm - Please see pager noted on [email or web address]  After 7pm go to [email or web address]  And look for the night coverage person covering for me after hours  [facility] Office  [phone]

## 2018-05-11 NOTE — Progress Notes (Signed)
Patient ID: Antonio Carrillo, male   DOB: 1940/04/22, 78 y.o.   MRN: [id number]     Advanced Heart Failure Rounding Note  PCP-Cardiologist: No primary care provider on file.   Subjective:    Admitted from clinic 04/25/2018 with marked volume overloaded and abdominal distention.   Patient had LLE hematoma.  Ortho consulted. Aspirated 15 cc of serosanguinous fluid. Held Eliquis briefly. No s/s compartment syndrome. No role for surgical intervention. Continue compressive wrap.  Today, co-ox lower at 53% on milrinone 0.25 mcg/kg/min. He is on oral bumetanide with CVP 8 on my reading today.   Went into atrial fibrillation 2/7 but back in NSR this morning on amiodarone gtt.    Breathing overall improved.  Main complaint today = constipation.   Echo 04/25/2018: LV EF 55-60%, D-shaped interventricular septum with RV enlargement and dysfunction, severe TR.   Objective:   Weight Range: 88 kg Body mass index is 27.06 kg/m.   Vital Signs:   Temp:  [97.5 F (36.4 C)-98.3 F (36.8 C)] 97.5 F (36.4 C) (02/08 0522) Pulse Rate:  [64-93] 66 (02/08 0522) Resp:  [14-23] 20 (02/08 0717) BP: (103-120)/(58-74) 112/62 (02/08 0522) SpO2:  [93 %-96 %] 94 % (02/08 0522) Weight:  [88 kg-88.3 kg] 88 kg (02/08 0717) Last BM Date: 05/07/18  Weight change: Filed Weights   05/10/18 0500 05/11/18 0618 05/11/18 0717  Weight: 88.2 kg 88.3 kg 88 kg   Intake/Output:   Intake/Output Summary (Last 24 hours) at 05/11/2018 1106 Last data filed at 05/11/2018 0717 Gross per 24 hour  Intake 72.8 ml  Output 2225 ml  Net -2152.2 ml    Physical Exam   General: NAD, frail Neck: JVP 7-8 cm, no thyromegaly or thyroid nodule.  Lungs: Clear to auscultation bilaterally with normal respiratory effort. CV: Nondisplaced PMI.  Heart regular S1/S2, no S3/S4, no murmur.  1+ edema to knees with lower legs wrapped.   Abdomen: Soft, nontender, no hepatosplenomegaly, no distention.  Skin: Intact without lesions or rashes.    Neurologic: Alert and oriented x 3.  Psych: Normal affect. Extremities: No clubbing or cyanosis.  HEENT: Normal.    Telemetry   NSR 70s, personally reviewed.  Labs    CBC Recent Labs    05/10/18 0439 05/11/18 0812  WBC 14.1* 12.9*  HGB 8.1* 8.2*  HCT 26.4* 26.8*  MCV 91.0 89.6  PLT 139* 478*   Basic Metabolic Panel Recent Labs    05/10/18 0439 05/11/18 0812  NA 131* 131*  K 4.6 3.6  CL 90* 87*  CO2 31 30  GLUCOSE 124* 165*  BUN 43* 32*  CREATININE 1.20 1.07  CALCIUM 8.5* 8.5*   Liver Function Tests Recent Labs    05/10/18 0439 05/11/18 0812  AST 33 30  ALT 26 24  ALKPHOS 137* 145*  BILITOT 0.9 0.7  PROT 5.2* 5.0*  ALBUMIN 2.7* 2.6*   No results for input(s): LIPASE, AMYLASE in the last 72 hours. Cardiac Enzymes No results for input(s): CKTOTAL, CKMB, CKMBINDEX, TROPONINI in the last 72 hours.  BNP: BNP (last 3 results) Recent Labs    04/25/18 0949  BNP 1,544.3*    ProBNP (last 3 results) No results for input(s): PROBNP in the last 8760 hours.   D-Dimer No results for input(s): DDIMER in the last 72 hours. Hemoglobin A1C No results for input(s): HGBA1C in the last 72 hours. Fasting Lipid Panel No results for input(s): CHOL, HDL, LDLCALC, TRIG, CHOLHDL, LDLDIRECT in the last 72 hours. Thyroid Function  Tests No results for input(s): TSH, T4TOTAL, T3FREE, THYROIDAB in the last 72 hours.  Invalid input(s): FREET3  Other results:   Imaging    No results found.   Medications:     Scheduled Medications: . allopurinol  100 mg Oral Daily  . amiodarone  400 mg Oral BID  . apixaban  5 mg Oral BID  . bumetanide  3 mg Oral BID  . diclofenac sodium  2 g Topical QID  . feeding supplement (ENSURE ENLIVE)  237 mL Oral BID BM  . HYDROcodone-acetaminophen  1 tablet Oral Q6H  . iron polysaccharides  150 mg Oral Daily  . LORazepam  0.5 mg Oral TID  . multivitamin with minerals  1 tablet Oral Daily  . polyethylene glycol  17 g Oral BID  .  potassium chloride  40 mEq Oral Once  . senna-docusate  1 tablet Oral BID  . sertraline  200 mg Oral Daily  . sodium chloride flush  10-40 mL Intracatheter Q12H  . spironolactone  50 mg Oral BID  . terbinafine  250 mg Oral Daily    Infusions: . milrinone 0.25 mcg/kg/min (05/10/18 2037)    PRN Medications: benzocaine, bisacodyl, HYDROcodone-acetaminophen, lidocaine, magic mouthwash w/lidocaine, morphine injection, ondansetron **OR** ondansetron (ZOFRAN) IV, prochlorperazine, sodium chloride flush, white petrolatum  Patient Profile   Antonio Carrillo is a 78 y.o. male with h/o AF s/p several ablations, aortic root aneurysm, s/p aortic root replacement with CABG 1, RV failure, CLL, and ascites.   Sent for admission from HF clinic 04/25/2018 with marked volume overload on exam, RLE wound, and marked ascites.   Assessment/Plan   1. 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 cMRI 4/18 at [facility] with normal LV function. RV severely dilated and mild RV dysfunction.  Echo this admission with LV EF 55-60%, RV dilated and dysfunctional, severe TR, D-shaped septum.  He has diuresed well, CVP down to 8.  Co-ox down to 53% this morning on milrinone 0.25 mcg/kg/min.  - Continue bumex 3 mg BID.  - I will decrease milrinone to 0.125 today, may need to slow the wean if co-ox lower tomorrow.  - Creatinine down to 1.07, restart him on digoxin 0.125 for long-term RV support.  - Continue spironolactone 50 mg BID.  - Long-term prognosis is quite poor.  With RV failure, not candidate for LVAD and also would be poor HD candidate. Palliative Care following. He has been resistant to [place] discussions. He is hopeful that he may get some recovery. He was very satisfied with his QOL recently and would like to get back to that.  2. PAF: S/p multiple ablations and MAZE. Atrial fibrillation on 2/7, back to NSR on amiodarone gtt today.  - May transition to  amiodarone 400 mg bid.   - Eliquis restarted after holding for leg hematoma.  Hgb stable 8.2.  3. CLL: Per Oncology.  Now s/p pelvic XRT. No change.  4. CAD and CABG x 1: No chest pain.   - No ASA given apixaban use.     5. Aortic root aneurysm s/p repair  6. LLE hematoma: Ortho following. Aspirated 15 cc serosanguinous fluid.  Eliquis restarted with stable hemoglobin. No role for surgery. Continue compressive wrap.  Pain much improved on current regimen.    7. Iron deficiency Anemia: No overt bleeding.  - Received feraheme 11/25.  8. Ascites: Persists.  S/p 2 paracenteses recently with 4.1 and 1.3 L out.  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 Cytology from paracentesis has repeatedly been negative for malignancy - Consider repeat paracentesis as needed. No change.   9. Hyponatremia: Sodium stable, mildly decreased.  10. Hypokalemia: Resolved.  11. Hypotension: Stable. SBPs OK.  - Consider midodrine as needed 12. LLE cellulitis: Improving. Finished Vancomycin.  No change.  13. ? Glossitis: Chronic, intermittent occurrence since his cancer treatment - Continue magic mouthwash. No change.  14. Constipation: Enema per primary service.    Loralie Champagne, MD  05/11/2018 11:06 AM   Advanced Heart Failure Team Pager [phone] (M-F; 7a - 4p)  Please contact [facility] for night-coverage after hours (4p -7a ) and weekends on [email or web address]

## 2018-05-12 LAB — COMPREHENSIVE METABOLIC PANEL
ALT: 25 U/L (ref 0–44)
AST: 35 U/L (ref 15–41)
Albumin: 2.6 g/dL — ABNORMAL LOW (ref 3.5–5.0)
Alkaline Phosphatase: 161 U/L — ABNORMAL HIGH (ref 38–126)
Anion gap: 13 (ref 5–15)
BUN: 27 mg/dL — ABNORMAL HIGH (ref 8–23)
CHLORIDE: 88 mmol/L — AB (ref 98–111)
CO2: 28 mmol/L (ref 22–32)
Calcium: 8.5 mg/dL — ABNORMAL LOW (ref 8.9–10.3)
Creatinine, Ser: 1 mg/dL (ref 0.61–1.24)
GFR calc Af Amer: >60 mL/min (ref >60)
GFR calc non Af Amer: >60 mL/min (ref >60)
Glucose, Bld: 146 mg/dL — ABNORMAL HIGH (ref 70–99)
Potassium: 3.8 mmol/L (ref 3.5–5.1)
SODIUM: 129 mmol/L — AB (ref 135–145)
Total Bilirubin: 0.8 mg/dL (ref 0.3–1.2)
Total Protein: 5.2 g/dL — ABNORMAL LOW (ref 6.5–8.1)

## 2018-05-12 LAB — CBC
HEMATOCRIT: 27.8 % — AB (ref 39.0–52.0)
Hemoglobin: 8.7 g/dL — ABNORMAL LOW (ref 13.0–17.0)
MCH: 27 pg (ref 26.0–34.0)
MCHC: 31.3 g/dL (ref 30.0–36.0)
MCV: 86.3 fL (ref 80.0–100.0)
Platelets: 196 10*3/uL (ref 150–400)
RBC: 3.22 MIL/uL — ABNORMAL LOW (ref 4.22–5.81)
RDW: 16.8 % — ABNORMAL HIGH (ref 11.5–15.5)
WBC: 14.6 10*3/uL — ABNORMAL HIGH (ref 4.0–10.5)
nRBC: 0 % (ref 0.0–0.2)

## 2018-05-12 LAB — COOXEMETRY PANEL
Carboxyhemoglobin: 1.5 % (ref 0.5–1.5)
Methemoglobin: 1.8 % — ABNORMAL HIGH (ref 0.0–1.5)
O2 Saturation: 53.3 %
Total hemoglobin: 9 g/dL — ABNORMAL LOW (ref 12.0–16.0)

## 2018-05-12 MED ORDER — HYDROCODONE-ACETAMINOPHEN 5-325 MG PO TABS
1.0000 | ORAL_TABLET | Freq: Three times a day (TID) | ORAL | Status: DC
  Administered 2018-05-12 – 2018-05-15 (×3): 1 via ORAL
  Filled 2018-05-12 (×4): qty 1

## 2018-05-12 NOTE — Progress Notes (Signed)
PROGRESS NOTE    REAL CONA  RWE:[id number] DOB: [birth year] DOA: 04/25/2018 PCP: Patient, No Pcp Per   Brief Narrative:  HPI On 04/25/2018 by Dr. Barb Merino DAIKI DICOSTANZO is a 78 y.o. male with medical history significant of CLL, hypertension, hyperlipidemia, paroxysmal A. 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  Patient has extensive medical problems.  He has history of atrial fibrillation with multiple ablations, aortic root replacement with CABG, right ventricular heart failure and CLL.  Patient has been going through multiple diuretics to help with his leg swellings.  According to the patient, it was manageable until about a month now.  Patient stated that his legs are huge and edematous, they were skinny before Christmas.  Patient gained about 40 pounds of weight since 1 month.  He had some ascites in the past, however since last 1 month he had to have it tapped 3 times.  Patient also has persistent nausea but no vomiting.  Patient also has pain due to tightness of the legs.  He also injured his right leg at home and had to have sutures placed which are draining liquid around the suture lines.  Patient denies any chest pain or palpitation.  He is short of breath on exertion but none at rest.  He denies orthopnea or paroxysmal nocturnal dyspnea.  Patient recently on Bumex.  He went to see cardiologist today in the office, due to significant symptoms while sent to inpatient admission. Patient examined at the bedside.  He was on room air.  His blood pressures are stable.  WBC count is elevated, however chronic.  His potassium was 5.5, he is on Aldactone and potassium supplement.  His hemoglobin is more or less at baseline.  Creatinine is 2.69 which is significantly above his baseline of 1.4.  Interim history Admitted  with CHF exacerbation, cardiology/HF team currently consulted and following. Orthopedics also consulted. Patient developed spontaneous hematoma in LLE, eliquis was held.  Assessment & Plan   Acute on chronic combined systolic and diastolic heart failure  -with hyponatremia, ascites and pedal edema -Cardiology and palliative care consulted and appreciated -Patient was on IV Lasix and transitioned to Bumex 3mg  BID -Was placed on a milrinone drip - cardiology discontinuing today- also starting digoxin -Monitor intake and output, daily weights -Echocardiogram 04/25/2018 showed normal LV systolic function, mild LVE, mild AI, mild  MR, severe biatrial enlargement, mild RVE with moderate RV dysfunction, severe TR  Paroxysmal atrial fibrillation -Patient has had multiple ablations and MAZE in the past -Cardiology currently following -Continue amiodarone -digoxin restarted today -Eliquis was briefly held due to hematoma- has been restarted  LLE hematoma -spontaneous -LE doppler negative for DVT -CT LLE: No CT evidence of left lower extremity DVT.  Probable subcutaneous hematoma anterior laterally in the proximal left lower leg.  Generalized subcutaneous edema in the distal left lower leg, possibly cellulitis. -patient was briefly placed on vancomycin again on 2/5, however this is less likely infectious, will discontinue vancomycin (recent AKI) and monitor- patient also afebrile with improvement of leukocytosis -Orthopedics consulted and appreciated. Dr. Doreatha Martin able to aspirate 15cc serosanguinous fluid on 2/5.  -Discussed with Dr. Doreatha Martin, hold Eliquis- may restart on 05/10/2018. Continue ACE wrap to provide compression. No surgical intervention at this time.  Acute kidney injury -On admission, creatinine was 2.5, baseline less than 1 -Creatinine currently 1 -  Continue to monitor BMP  Hypokalemia -Likely secondary to diuresis -Resolved -Continue to monitor BMP  Right foot wound/dorsal  laceration with leukocytosis -was seen by orthopedics at [facility] on 04/17/2018 and given Keflex -On 04/26/2018-  Patient noted to have swelling with weeping profusely and pedal edema -Orthopedic surgery consulted and appreciated, Dr. Doreatha Martin remove sutures and recommended antibiotics with doxycycline which is now been discontinued -Wound care consulted -Continue wet-to-dry dressings -Patient with continued erythema and swelling-cardiology started vancomycin on 05/02/2018- however discontinued on 05/07/2018  Anxiety/depression -Continue Zoloft, Ativan  Chronic anemia/iron deficiency anemia -hemoglobin currently 8.7 -hemoglobin has been ranging from 7-8 -suspect there will be a drop given hematoma -Continue to monitor CBC  CLL -Diagnosed in 2015 and followed at [place] note from 04/01/2018 recommends observation only after his treatment and repeat CT in 3 months  Painful mouth and throat -Has been a chronic issue for several years and patient has been undergoing treatment for CLL -Continue Magic mouthwash with lidocaine 4 times daily PRN -Consider outpatient ENT or GI evaluations -No evidence of thrush  OSA -Continue CPAP nightly  Gout -Continue allopurinol  Left knee pain with arthritis -Continue Voltaren gel  Elevated TSH -Likely sick euthyroid, free T4 normal  Constipation -resolved, continue currently regimen -was given dose of oral lactulose on 05/11/2018  Abnormal LFTs -Resolved  Hyponatremia -Continue to monitor, currently 129  Thrombocytopenia -resolved, platelets 196 -Continue to monitor   Hyperglycemia -Hemoglobin A1c 6.1, continue insulin sliding scale, CBG monitoring  Goals of care and chronic pain -Patient currently DNR -Palliative care consulted and appreciate -pain is getting controlled, however patient with constipation. Will decrease scheduled norco to TID; continue norco and morphine PRN -pain seems to be better  controlled  DVT Prophylaxis  Eliquis on hold given hematoma  Code Status:DNR  Family Communication: Daugther at bedside  Disposition Plan: Admitted. Dispo pending improvement and further recommendations from cardiology. Possible discharge to home in the next 24-48hrs  Consultants Cardiology Palliative care Orthopedic surgery  Procedures  PICC line placement 04/25/2018 Suture removal 04/26/2018  Antibiotics   Anti-infectives (From admission, onward)   Start     Dose/Rate Route Frequency Ordered Stop   05/08/18 1800  vancomycin (VANCOCIN) 1,500 mg in sodium chloride 0.9 % 500 mL IVPB  Status:  Discontinued     1,500 mg 250 mL/hr over 120 Minutes Intravenous Every 24 hours 05/08/18 1713 05/09/18 0821   05/02/18 1400  vancomycin (VANCOCIN) 1,500 mg in sodium chloride 0.9 % 500 mL IVPB  Status:  Discontinued     1,500 mg 250 mL/hr over 120 Minutes Intravenous Every 24 hours 05/02/18 1232 05/07/18 0854   04/30/18 2200  doxycycline (VIBRA-TABS) tablet 100 mg     100 mg Oral Every 12 hours 04/30/18 1401 05/01/18 0808   04/26/18 1330  doxycycline (VIBRA-TABS) tablet 100 mg  Status:  Discontinued     100 mg Oral Every 12 hours 04/26/18 1242 04/30/18 1401   04/25/18 1300  terbinafine (LAMISIL) tablet 250 mg     250 mg Oral Daily 04/25/18 1143        Subjective:   Shon Indelicato seen and examined today.  Patient was able to have a bowel movement yesterday and this morning.  Feels like pain is mild.  Would like to have less pain medications.  Denies current chest pain, shortness of breath, abdominal pain, nausea or vomiting, dizziness or headache.  Objective:   Vitals:   05/11/18 1620 05/11/18 1951 05/12/18 0023 05/12/18  0400  BP: 112/63 (!) 110/59 114/66 126/73  Pulse:  73 76 72  Resp: (!) 23 20 14 19   Temp: (!) 100.5 F (38.1 C) 98.5 F (36.9 C) 98 F (36.7 C) 98.1 F (36.7 C)  TempSrc: Axillary Oral Oral Oral  SpO2: 91% 99% 100% 100%  Weight:    87 kg  Height:         Intake/Output Summary (Last 24 hours) at 05/12/2018 1108 Last data filed at 05/12/2018 0900 Gross per 24 hour  Intake 1341.14 ml  Output 2275 ml  Net -933.86 ml   Filed Weights   05/11/18 0618 05/11/18 0717 05/12/18 0400  Weight: 88.3 kg 88 kg 87 kg   Exam  General: Well developed, chronically ill-appearing, NAD  HEENT: NCAT, mucous membranes moist.   Neck: Supple  Cardiovascular: S1 S2 auscultated, 2/6SEM, RRR  Respiratory: Clear to auscultation bilaterally with equal chest rise  Abdomen: Soft, nontender, nondistended, + bowel sounds  Extremities: warm dry without cyanosis clubbing. LLE edema. LE in ACE wraps  Neuro: AAOx3, nonfocal  Psych: Pleasant, appropriate mood and affect  Data Reviewed: I have personally reviewed following labs and imaging studies  CBC: Recent Labs  Lab 05/06/18 0438 05/07/18 0448 05/08/18 0356 05/09/18 1130 05/10/18 0439 05/11/18 0812 05/12/18 0456  WBC 14.7* 12.5* 12.4* 18.2* 14.1* 12.9* 14.6*  NEUTROABS 12.1* 10.1* 9.9*  --   --   --   --   HGB 7.8* 8.2* 7.9* 7.7* 8.1* 8.2* 8.7*  HCT 26.5* 26.6* 26.0* 25.1* 26.4* 26.8* 27.8*  MCV 92.0 92.4 89.3 90.9 91.0 89.6 86.3  PLT 145* 146* 145* 149* 139* 146* 161   Basic Metabolic Panel: Recent Labs  Lab 05/06/18 0438 05/07/18 0448 05/08/18 0356 05/09/18 0413 05/10/18 0439 05/11/18 0812 05/12/18 0456  NA 133* 132* 132* 131* 131* 131* 129*  K 3.9 4.6 4.6 4.8 4.6 3.6 3.8  CL 92* 89* 90* 91* 90* 87* 88*  CO2 30 32 34* 31 31 30 28   GLUCOSE 213* 227* 146* 152* 124* 165* 146*  BUN 53* 45* 44* 38* 43* 32* 27*  CREATININE 1.26* 1.12 1.19 1.20 1.20 1.07 1.00  CALCIUM 8.5* 8.7* 8.8* 8.5* 8.5* 8.5* 8.5*  MG 2.3 2.2 2.2  --   --   --   --   PHOS 2.5 2.6 2.7  --   --   --   --    GFR: Estimated Creatinine Clearance: 63.8 mL/min (by C-G formula based on SCr of 1 mg/dL). Liver Function Tests: Recent Labs  Lab 05/08/18 0356 05/09/18 0413 05/10/18 0439 05/11/18 0812 05/12/18 0456  AST  26 28 33 30 35  ALT 24 23 26 24 25   ALKPHOS 150* 141* 137* 145* 161*  BILITOT 0.8 1.0 0.9 0.7 0.8  PROT 5.4* 5.1* 5.2* 5.0* 5.2*  ALBUMIN 2.9* 2.7* 2.7* 2.6* 2.6*   No results for input(s): LIPASE, AMYLASE in the last 168 hours. No results for input(s): AMMONIA in the last 168 hours. Coagulation Profile: No results for input(s): INR, PROTIME in the last 168 hours. Cardiac Enzymes: No results for input(s): CKTOTAL, CKMB, CKMBINDEX, TROPONINI in the last 168 hours. BNP (last 3 results) No results for input(s): PROBNP in the last 8760 hours. HbA1C: No results for input(s): HGBA1C in the last 72 hours. CBG: No results for input(s): GLUCAP in the last 168 hours. Lipid Profile: No results for input(s): CHOL, HDL, LDLCALC, TRIG, CHOLHDL, LDLDIRECT in the last 72 hours. Thyroid Function Tests: No results for input(s): TSH,  T4TOTAL, FREET4, T3FREE, THYROIDAB in the last 72 hours. Anemia Panel: No results for input(s): VITAMINB12, FOLATE, FERRITIN, TIBC, IRON, RETICCTPCT in the last 72 hours. Urine analysis: No results found for: COLORURINE, APPEARANCEUR, LABSPEC, PHURINE, GLUCOSEU, HGBUR, BILIRUBINUR, KETONESUR, PROTEINUR, UROBILINOGEN, NITRITE, LEUKOCYTESUR Sepsis Labs: @LABRCNTIP (procalcitonin:4,lacticidven:4)  )No results found for this or any previous visit (from the past 240 hour(s)).    Radiology Studies: Dg Abd 1 View  Result Date: 05/11/2018 CLINICAL DATA:  Constipation and nausea EXAM: ABDOMEN - 1 VIEW COMPARISON:  None. FINDINGS: There is diffuse stool throughout the colon. There is no appreciable bowel dilatation or air-fluid level to suggest bowel obstruction. No free air. The visualized lung bases are clear. There is degenerative change in the lumbar spine. IMPRESSION: Diffuse stool throughout colon, a finding indicative of constipation. No bowel obstruction or free air evident. Electronically Signed   By: Lowella Grip III M.D.   On: 05/11/2018 13:21     Scheduled  Meds: . allopurinol  100 mg Oral Daily  . amiodarone  400 mg Oral BID  . apixaban  5 mg Oral BID  . bumetanide  3 mg Oral BID  . diclofenac sodium  2 g Topical QID  . feeding supplement (ENSURE ENLIVE)  237 mL Oral BID BM  . HYDROcodone-acetaminophen  1 tablet Oral Q8H  . iron polysaccharides  150 mg Oral Daily  . LORazepam  0.5 mg Oral TID  . multivitamin with minerals  1 tablet Oral Daily  . polyethylene glycol  17 g Oral BID  . senna-docusate  1 tablet Oral BID  . sertraline  200 mg Oral Daily  . sodium chloride flush  10-40 mL Intracatheter Q12H  . spironolactone  50 mg Oral BID  . terbinafine  250 mg Oral Daily   Continuous Infusions: . milrinone 0.125 mcg/kg/min (05/11/18 1125)     LOS: 17 days   Time Spent in minutes   30 minutes  Jiovani Mccammon D.O. on 05/12/2018 at 11:08 AM  Between 7am to 7pm - Please see pager noted on [email or web address]  After 7pm go to [email or web address]  And look for the night coverage person covering for me after hours  [facility] Office  [phone]

## 2018-05-12 NOTE — Plan of Care (Signed)
Care plans reviewed and patient is progressing.  

## 2018-05-12 NOTE — Progress Notes (Signed)
Pt refusing CPAP for the night. RT will continue to monitor as needed.  

## 2018-05-12 NOTE — Progress Notes (Signed)
Daily Progress Note   Patient Name: Antonio Carrillo       Date: 05/12/2018 DOB: [birth year]  Age: 78 y.o. MRN#: [id number] Attending Physician: Cristal Ford, DO Primary Care Physician: Patient, No Pcp Per Admit Date: 04/25/2018  Reason for Consultation/Follow-up: Establishing goals of care, Non pain symptom management and Pain control  Subjective:  Patient awake, alert, oriented. Resting in bed, wife at bedside.   He slept fairly well, he is in no distress, denies chest pain, had a bowel movement.    Length of Stay: 17  Current Medications: Scheduled Meds:  . allopurinol  100 mg Oral Daily  . amiodarone  400 mg Oral BID  . apixaban  5 mg Oral BID  . bumetanide  3 mg Oral BID  . diclofenac sodium  2 g Topical QID  . feeding supplement (ENSURE ENLIVE)  237 mL Oral BID BM  . HYDROcodone-acetaminophen  1 tablet Oral Q8H  . iron polysaccharides  150 mg Oral Daily  . LORazepam  0.5 mg Oral TID  . multivitamin with minerals  1 tablet Oral Daily  . polyethylene glycol  17 g Oral BID  . senna-docusate  1 tablet Oral BID  . sertraline  200 mg Oral Daily  . sodium chloride flush  10-40 mL Intracatheter Q12H  . spironolactone  50 mg Oral BID  . terbinafine  250 mg Oral Daily    Continuous Infusions: . milrinone 0.125 mcg/kg/min (05/11/18 1125)    PRN Meds: benzocaine, bisacodyl, HYDROcodone-acetaminophen, lidocaine, magic mouthwash w/lidocaine, morphine injection, ondansetron **OR** ondansetron (ZOFRAN) IV, prochlorperazine, sodium chloride flush, white petrolatum  Physical Exam Vitals signs and nursing note reviewed.  Constitutional:      General: He is awake.  HENT:     Head: Normocephalic and atraumatic.  Cardiovascular:     Rate and Rhythm: Regular rhythm.  Pulmonary:   Effort: No tachypnea, accessory muscle usage or respiratory distress.  Abdominal:     Tenderness: There is no abdominal tenderness.  Skin:    General: Skin is warm and dry.     Coloration: Skin is pale.  Neurological:     Mental Status: He is alert and oriented to person, place, and time.  Psychiatric:        Mood and Affect: Mood normal.        Speech:  Speech normal.        Behavior: Behavior is cooperative.        Cognition and Memory: Cognition normal.         Awake alert Resting in bed Denies chest pain In no distress    Vital Signs: BP 126/73 (BP Location: Left Arm)   Pulse 72   Temp 98.1 F (36.7 C) (Oral)   Resp 19   Ht 5\' 11"  (1.803 m)   Wt 87 kg   SpO2 100%   BMI 26.75 kg/m  SpO2: SpO2: 100 % O2 Device: O2 Device: Room Air O2 Flow Rate: O2 Flow Rate (L/min): 2 L/min  Intake/output summary:   Intake/Output Summary (Last 24 hours) at 05/12/2018 1315 Last data filed at 05/12/2018 1248 Gross per 24 hour  Intake 678.32 ml  Output 2555 ml  Net -1876.68 ml   LBM: Last BM Date: 05/11/18 Baseline Weight: Weight: 101.3 kg Most recent weight: Weight: 87 kg       Palliative Assessment/Data: PPS 50%   Flowsheet Rows     Most Recent Value  Intake Tab  Referral Department  Cardiology  Unit at Time of Referral  Cardiac/Telemetry Unit  Palliative Care Primary Diagnosis  Cardiac  Date Notified  04/26/18  Palliative Care Type  New Palliative care  Reason for referral  Clarify Goals of Care  Date of Admission  04/25/18  Date first seen by Palliative Care  04/30/18  # of days Palliative referral response time  4 Day(s)  # of days IP prior to Palliative referral  1  Clinical Assessment  Psychosocial & Spiritual Assessment  Palliative Care Outcomes      Patient Active Problem List   Diagnosis Date Noted  . Leg hematoma, left, initial encounter 05/09/2018  . Anxiety state   . Chronic bilateral low back pain without sciatica   . Palliative care by specialist     . Right foot injury 04/26/2018  . Palliative care encounter   . Acute on chronic combined systolic and diastolic CHF (congestive heart failure) ([place]) 04/25/2018  . AKI (acute kidney injury) ([place]) 04/25/2018  . Hyperkalemia 04/25/2018  . Ascites 04/25/2018  . CLL (chronic lymphocytic leukemia) ([place]) 04/25/2018  . Anemia 04/25/2018  . CHF (congestive heart failure) ([place]) 04/25/2018  . 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 Coronary artery disease 07/28/2015  . Pericardial effusion 01/19/2015  . Cerebrovascular disease 01/19/2015  . Anticoagulants causing adverse effect in therapeutic use   . Hyperthyroidism   . Testicular failure   . Aortic aneurysm ([place])   . HTN (hypertension)   . Hyperlipidemia   . 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 PAD (peripheral artery disease) ([place]) 10/31/2010  . Long term (current) use of anticoagulants 06/22/2010  . SHORTNESS OF BREATH 12/28/2008  . CELLULITIS, MILD 12/11/2008  . HYPERLIPIDEMIA-MIXED 11/16/2008  . Aneurysm of thoracic aorta ([place]) 11/16/2008  . HYPERTHYROIDISM 12/05/2006  . ATRIAL FIBRILLATION 12/05/2006    Palliative Care Assessment & Plan   Patient Profile: 78 y.o. male  with past medical history of CLL, HTN, HLD, RV failure, atrial fibrillation (on Eliquis), s/p multiple ablations, s/p CABG, severe right ventricular failure, recurrent ascites admitted on 04/25/2018 with leg swelling and weight gain 40 lbs x 1 month. Now inotrope dependent and continues with aggressive diuresis although renal function has improved.   Assessment: Acute on chronic combined CHF Hyponatremia Ascites Pedal edema PAF AKI Right foot wound/cellulitis Anxiety Depression Insomnia CLL  Recommendations/Plan:  reviewed symptom management medications  with  patient and wife. Patient is on ativan and norco    Additionally, he also has Morphine IV prn severe pain. He has received one dose 2 mg IV Morphine earlier today.   Continue scheduled Miralax  and Senna  Continue prn magic mouthwash.   Continue current plan of care per attending and cardiology.   Code Status: DNR   Code Status Orders  (From admission, onward)         Start     Ordered   05/01/18 1044  Do not attempt resuscitation (DNR)  Continuous    Question Answer Comment  In the event of cardiac or respiratory ARREST Do not call a "code blue"   In the event of cardiac or respiratory ARREST Do not perform Intubation, CPR, defibrillation or ACLS   In the event of cardiac or respiratory ARREST Use medication by any route, position, wound care, and other measures to relive pain and suffering. May use oxygen, suction and manual treatment of airway obstruction as needed for comfort.      05/01/18 1043        Code Status History    Date Active Date Inactive Code Status Order ID Comments User Context   04/25/2018 1143 05/01/2018 1043 Full Code [id number]  Barb Merino, MD Inpatient       Prognosis:   Poor long-term prognosis  Discharge Planning:  To Be Determined  Care plan was discussed with patient, wife  Thank you for allowing the Palliative Medicine Team to assist in the care of this patient.   Time In: 1300 Time Out: 1315 Total Time 15 Prolonged Time Billed no      Greater than 50%  of this time was spent counseling and coordinating care related to the above assessment and plan.  Loistine Chance MD Palliative Medicine Team  Phone: [phone] Fax: [phone]  Please contact Palliative Medicine Team phone at [phone] for questions and concerns.

## 2018-05-12 NOTE — Progress Notes (Signed)
Patient ID: Antonio Carrillo, male   DOB: 1940/07/06, 78 y.o.   MRN: [id number]     Advanced Heart Failure Rounding Note  PCP-Cardiologist: No primary care provider on file.   Subjective:    Admitted from clinic 04/25/2018 with marked volume overloaded and abdominal distention.   Patient had LLE hematoma.  Ortho consulted. Aspirated 15 cc of serosanguinous fluid. Held Eliquis briefly. No s/s compartment syndrome. No role for surgical intervention. Continue compressive wrap.  Today, co-ox stable at 53% on milrinone 0.125 mcg/kg/min. He is on oral bumetanide with CVP 10 on my reading today.  I/Os negative with weight down.   He is in NSR.    Feels better, had bowel movement yesterday.  Echo 04/25/2018: LV EF 55-60%, D-shaped interventricular septum with RV enlargement and dysfunction, severe TR.   Objective:   Weight Range: 87 kg Body mass index is 26.75 kg/m.   Vital Signs:   Temp:  [98 F (36.7 C)-100.5 F (38.1 C)] 98.1 F (36.7 C) (02/09 0400) Pulse Rate:  [72-76] 72 (02/09 0400) Resp:  [14-23] 19 (02/09 0400) BP: (110-126)/(59-73) 126/73 (02/09 0400) SpO2:  [91 %-100 %] 100 % (02/09 0400) Weight:  [87 kg] 87 kg (02/09 0400) Last BM Date: 05/11/18  Weight change: Filed Weights   05/11/18 0618 05/11/18 0717 05/12/18 0400  Weight: 88.3 kg 88 kg 87 kg   Intake/Output:   Intake/Output Summary (Last 24 hours) at 05/12/2018 0921 Last data filed at 05/12/2018 0900 Gross per 24 hour  Intake 1341.14 ml  Output 2275 ml  Net -933.86 ml    Physical Exam   General: NAD, frail Neck: JVP 8-9 cm, no thyromegaly or thyroid nodule.  Lungs: Clear to auscultation bilaterally with normal respiratory effort. CV: Nondisplaced PMI.  Heart regular S1/S2, no S3/S4, no murmur.  1+ ankle edema, lower legs wrapped.  Abdomen: Soft, nontender, no hepatosplenomegaly, no distention.  Skin: Intact without lesions or rashes.  Neurologic: Alert and oriented x 3.  Psych: Normal affect. Extremities:  No clubbing or cyanosis.  HEENT: Normal.    Telemetry   NSR 70s, personally reviewed.  Labs    CBC Recent Labs    05/11/18 0812 05/12/18 0456  WBC 12.9* 14.6*  HGB 8.2* 8.7*  HCT 26.8* 27.8*  MCV 89.6 86.3  PLT 146* 301   Basic Metabolic Panel Recent Labs    05/11/18 0812 05/12/18 0456  NA 131* 129*  K 3.6 3.8  CL 87* 88*  CO2 30 28  GLUCOSE 165* 146*  BUN 32* 27*  CREATININE 1.07 1.00  CALCIUM 8.5* 8.5*   Liver Function Tests Recent Labs    05/11/18 0812 05/12/18 0456  AST 30 35  ALT 24 25  ALKPHOS 145* 161*  BILITOT 0.7 0.8  PROT 5.0* 5.2*  ALBUMIN 2.6* 2.6*   No results for input(s): LIPASE, AMYLASE in the last 72 hours. Cardiac Enzymes No results for input(s): CKTOTAL, CKMB, CKMBINDEX, TROPONINI in the last 72 hours.  BNP: BNP (last 3 results) Recent Labs    04/25/18 0949  BNP 1,544.3*    ProBNP (last 3 results) No results for input(s): PROBNP in the last 8760 hours.   D-Dimer No results for input(s): DDIMER in the last 72 hours. Hemoglobin A1C No results for input(s): HGBA1C in the last 72 hours. Fasting Lipid Panel No results for input(s): CHOL, HDL, LDLCALC, TRIG, CHOLHDL, LDLDIRECT in the last 72 hours. Thyroid Function Tests No results for input(s): TSH, T4TOTAL, T3FREE, THYROIDAB in the last 72  hours.  Invalid input(s): FREET3  Other results:   Imaging    Dg Abd 1 View  Result Date: 05/11/2018 CLINICAL DATA:  Constipation and nausea EXAM: ABDOMEN - 1 VIEW COMPARISON:  None. FINDINGS: There is diffuse stool throughout the colon. There is no appreciable bowel dilatation or air-fluid level to suggest bowel obstruction. No free air. The visualized lung bases are clear. There is degenerative change in the lumbar spine. IMPRESSION: Diffuse stool throughout colon, a finding indicative of constipation. No bowel obstruction or free air evident. Electronically Signed   By: Lowella Grip III M.D.   On: 05/11/2018 13:21      Medications:     Scheduled Medications: . allopurinol  100 mg Oral Daily  . amiodarone  400 mg Oral BID  . apixaban  5 mg Oral BID  . bumetanide  3 mg Oral BID  . diclofenac sodium  2 g Topical QID  . feeding supplement (ENSURE ENLIVE)  237 mL Oral BID BM  . HYDROcodone-acetaminophen  1 tablet Oral Q8H  . iron polysaccharides  150 mg Oral Daily  . LORazepam  0.5 mg Oral TID  . multivitamin with minerals  1 tablet Oral Daily  . polyethylene glycol  17 g Oral BID  . senna-docusate  1 tablet Oral BID  . sertraline  200 mg Oral Daily  . sodium chloride flush  10-40 mL Intracatheter Q12H  . spironolactone  50 mg Oral BID  . terbinafine  250 mg Oral Daily    Infusions: . milrinone 0.125 mcg/kg/min (05/11/18 1125)    PRN Medications: benzocaine, bisacodyl, HYDROcodone-acetaminophen, lidocaine, magic mouthwash w/lidocaine, morphine injection, ondansetron **OR** ondansetron (ZOFRAN) IV, prochlorperazine, sodium chloride flush, white petrolatum  Patient Profile   Antonio Carrillo is a 78 y.o. male with h/o AF s/p several ablations, aortic root aneurysm, s/p aortic root replacement with CABG 1, RV failure, CLL, and ascites.   Sent for admission from HF clinic 04/25/2018 with marked volume overload on exam, RLE wound, and marked ascites.   Assessment/Plan   1. 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 [facility]MRI 4/18 at [facility] with normal LV function. RV severely dilated and mild RV dysfunction.  Echo this admission with LV EF 55-60%, RV dilated and dysfunctional, severe TR, D-shaped septum.  He has diuresed well with weight coming down on oral bumetanide.  CVP 10 today.  Co-ox stable at 53% this morning on milrinone 0.125 mcg/kg/min.  - Will try him off milrinone.  If he does not tolerate d/c, may need home milrinone for palliation to support RV.  I started him back on digoxin.  - Continue bumex 3 mg BID.   - Continue  spironolactone 50 mg BID.  - Long-term prognosis is quite poor.  With RV failure, not candidate for LVAD and also would be poor HD candidate. Palliative Care following. He has been resistant to [facility] discussions. He is hopeful that he may get some recovery. He was very satisfied with his QOL recently and would like to get back to that.  2. PAF: S/p multiple ablations and MAZE. Atrial fibrillation on 2/7, back to NSR on amiodarone.  - Continue amiodarone 400 mg bid.   - Eliquis restarted after holding for leg hematoma.  Hgb higher at 8.6.  3. CLL: Per Oncology.  Now s/p pelvic XRT. No change.  4. CAD and CABG x 1: No chest pain.   - No ASA given apixaban use.  5. Aortic root aneurysm s/p repair  6. LLE hematoma: Ortho following. Aspirated 15 cc serosanguinous fluid.  Eliquis restarted with stable hemoglobin. No role for surgery. Continue compressive wrap.  Pain much improved on current regimen.    7. Iron deficiency Anemia: No overt bleeding.  - Received feraheme 11/25.  8. Ascites: Persists.  S/p 2 paracenteses recently with 4.1 and 1.3 L out. 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 Cytology from paracentesis has repeatedly been negative for malignancy - Consider repeat paracentesis as needed. No change.   9. Hyponatremia: Sodium stable, mildly decreased.  10. Hypokalemia: Resolved.  11. Hypotension: Stable. SBPs OK.  - Consider midodrine as needed 12. LLE cellulitis: Improving. Finished Vancomycin.  No change.  13. ? Glossitis: Chronic, intermittent occurrence since his cancer treatment - Continue magic mouthwash. No change.  14. Constipation: Enema per primary service.    Loralie Champagne, MD  05/12/2018 9:21 AM   Advanced Heart Failure Team Pager [phone] (M-F; 7a - 4p)  Please contact [facility] for night-coverage after hours (4p -7a ) and weekends on [email or web address]

## 2018-05-13 DIAGNOSIS — C9110 Chronic lymphocytic leukemia of B-cell type not having achieved remission: Secondary | ICD-10-CM

## 2018-05-13 DIAGNOSIS — M545 Low back pain: Secondary | ICD-10-CM

## 2018-05-13 DIAGNOSIS — D509 Iron deficiency anemia, unspecified: Secondary | ICD-10-CM

## 2018-05-13 DIAGNOSIS — Z515 Encounter for palliative care: Secondary | ICD-10-CM

## 2018-05-13 DIAGNOSIS — E875 Hyperkalemia: Secondary | ICD-10-CM

## 2018-05-13 DIAGNOSIS — R188 Other ascites: Secondary | ICD-10-CM

## 2018-05-13 DIAGNOSIS — G8929 Other chronic pain: Secondary | ICD-10-CM

## 2018-05-13 LAB — COMPREHENSIVE METABOLIC PANEL
ALT: 22 U/L (ref 0–44)
AST: 24 U/L (ref 15–41)
Albumin: 2.5 g/dL — ABNORMAL LOW (ref 3.5–5.0)
Alkaline Phosphatase: 135 U/L — ABNORMAL HIGH (ref 38–126)
Anion gap: 8 (ref 5–15)
BUN: 25 mg/dL — ABNORMAL HIGH (ref 8–23)
CALCIUM: 8.1 mg/dL — AB (ref 8.9–10.3)
CO2: 30 mmol/L (ref 22–32)
Chloride: 92 mmol/L — ABNORMAL LOW (ref 98–111)
Creatinine, Ser: 0.98 mg/dL (ref 0.61–1.24)
GFR calc Af Amer: >60 mL/min (ref >60)
GFR calc non Af Amer: >60 mL/min (ref >60)
Glucose, Bld: 138 mg/dL — ABNORMAL HIGH (ref 70–99)
Potassium: 3.6 mmol/L (ref 3.5–5.1)
SODIUM: 130 mmol/L — AB (ref 135–145)
Total Bilirubin: 0.7 mg/dL (ref 0.3–1.2)
Total Protein: 4.9 g/dL — ABNORMAL LOW (ref 6.5–8.1)

## 2018-05-13 LAB — CBC
HCT: 26.2 % — ABNORMAL LOW (ref 39.0–52.0)
Hemoglobin: 8 g/dL — ABNORMAL LOW (ref 13.0–17.0)
MCH: 26.6 pg (ref 26.0–34.0)
MCHC: 30.5 g/dL (ref 30.0–36.0)
MCV: 87 fL (ref 80.0–100.0)
Platelets: 176 10*3/uL (ref 150–400)
RBC: 3.01 MIL/uL — ABNORMAL LOW (ref 4.22–5.81)
RDW: 16.7 % — ABNORMAL HIGH (ref 11.5–15.5)
WBC: 11.7 10*3/uL — ABNORMAL HIGH (ref 4.0–10.5)
nRBC: 0 % (ref 0.0–0.2)

## 2018-05-13 LAB — COOXEMETRY PANEL
Carboxyhemoglobin: 1.9 % — ABNORMAL HIGH (ref 0.5–1.5)
METHEMOGLOBIN: 1.7 % — AB (ref 0.0–1.5)
O2 Saturation: 69.5 %
Total hemoglobin: 7.7 g/dL — ABNORMAL LOW (ref 12.0–16.0)

## 2018-05-13 MED ORDER — DIGOXIN 125 MCG PO TABS
0.1250 mg | ORAL_TABLET | Freq: Every day | ORAL | Status: DC
  Administered 2018-05-13 – 2018-05-15 (×3): 0.125 mg via ORAL
  Filled 2018-05-13 (×3): qty 1

## 2018-05-13 NOTE — Progress Notes (Signed)
Daily Progress Note   Patient Name: Antonio Carrillo       Date: 05/13/2018 DOB: 11-Dec-1940  Age: 78 y.o. MRN#: [id number] Attending Physician: Georgette Shell, MD Primary Care Physician: Patient, No Pcp Per Admit Date: 04/25/2018  Reason for Consultation/Follow-up: Establishing goals of care, Non pain symptom management and Pain control  Subjective: Patient awake, alert, oriented. Daughter, Apolonio Schneiders at bedside.   Reports that he was constipated on Friday but finally able to have a good BM over the weekend and continues to have soft BM's. Reports ongoing relief from scheduled Norco and Ativan for pain/anxiety. Patient denies dyspnea.  Discussed plan of care and watchful waiting for another 48 hours since milrinone has been discontinued. Patient and family hopeful to get him home by Wednesday.   Apolonio Schneiders has many questions regarding home health. RN CM to see this afternoon. Answered questions regarding outpatient palliative services.    Length of Stay: 18  Current Medications: Scheduled Meds:  . allopurinol  100 mg Oral Daily  . amiodarone  400 mg Oral BID  . bumetanide  3 mg Oral BID  . diclofenac sodium  2 g Topical QID  . digoxin  0.125 mg Oral Daily  . feeding supplement (ENSURE ENLIVE)  237 mL Oral BID BM  . HYDROcodone-acetaminophen  1 tablet Oral Q8H  . iron polysaccharides  150 mg Oral Daily  . LORazepam  0.5 mg Oral TID  . multivitamin with minerals  1 tablet Oral Daily  . polyethylene glycol  17 g Oral BID  . senna-docusate  1 tablet Oral BID  . sertraline  200 mg Oral Daily  . sodium chloride flush  10-40 mL Intracatheter Q12H  . spironolactone  50 mg Oral BID  . terbinafine  250 mg Oral Daily    Continuous Infusions:   PRN Meds: benzocaine, bisacodyl,  HYDROcodone-acetaminophen, lidocaine, magic mouthwash w/lidocaine, morphine injection, ondansetron **OR** ondansetron (ZOFRAN) IV, prochlorperazine, sodium chloride flush, white petrolatum  Physical Exam Vitals signs and nursing note reviewed.  Constitutional:      General: He is awake.  HENT:     Head: Normocephalic and atraumatic.  Cardiovascular:     Rate and Rhythm: Regular rhythm.  Pulmonary:     Effort: No tachypnea, accessory muscle usage or respiratory distress.  Abdominal:     Tenderness: There  is no abdominal tenderness.  Skin:    General: Skin is warm and dry.     Coloration: Skin is pale.  Neurological:     Mental Status: He is alert and oriented to person, place, and time.  Psychiatric:        Mood and Affect: Mood normal.        Speech: Speech normal.        Behavior: Behavior is cooperative.        Cognition and Memory: Cognition normal.            Vital Signs: BP 107/66 (BP Location: Left Arm)   Pulse 64   Temp 97.8 F (36.6 C) (Oral)   Resp 20   Ht 5\' 11"  (1.803 m)   Wt 86.4 kg   SpO2 97%   BMI 26.57 kg/m  SpO2: SpO2: 97 % O2 Device: O2 Device: Room Air O2 Flow Rate: O2 Flow Rate (L/min): 2 L/min  Intake/output summary:   Intake/Output Summary (Last 24 hours) at 05/13/2018 1413 Last data filed at 05/13/2018 1200 Gross per 24 hour  Intake 618 ml  Output 2175 ml  Net -1557 ml   LBM: Last BM Date: 05/13/18 Baseline Weight: Weight: 101.3 kg Most recent weight: Weight: 86.4 kg       Palliative Assessment/Data: PPS 50%   Flowsheet Rows     Most Recent Value  Intake Tab  Referral Department  Cardiology  Unit at Time of Referral  Cardiac/Telemetry Unit  Palliative Care Primary Diagnosis  Cardiac  Date Notified  04/26/18  Palliative Care Type  New Palliative care  Reason for referral  Clarify Goals of Care  Date of Admission  04/25/18  Date first seen by Palliative Care  04/30/18  # of days Palliative referral response time  4 Day(s)  # of  days IP prior to Palliative referral  1  Clinical Assessment  Psychosocial & Spiritual Assessment  Palliative Care Outcomes      Patient Active Problem List   Diagnosis Date Noted  . Leg hematoma, left, initial encounter 05/09/2018  . Anxiety state   . Chronic bilateral low back pain without sciatica   . Palliative care by specialist   . Right foot injury 04/26/2018  . Palliative care encounter   . Acute on chronic combined systolic and diastolic CHF (congestive heart failure) ([place]) 04/25/2018  . AKI (acute kidney injury) ([place]) 04/25/2018  . Hyperkalemia 04/25/2018  . Ascites 04/25/2018  . CLL (chronic lymphocytic leukemia) ([place]) 04/25/2018  . Anemia 04/25/2018  . CHF (congestive heart failure) ([place]) 04/25/2018  . 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 Coronary artery disease 07/28/2015  . Pericardial effusion 01/19/2015  . Cerebrovascular disease 01/19/2015  . Anticoagulants causing adverse effect in therapeutic use   . Hyperthyroidism   . Testicular failure   . Aortic aneurysm ([place])   . HTN (hypertension)   . Hyperlipidemia   . 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 PAD (peripheral artery disease) ([place]) 10/31/2010  . Long term (current) use of anticoagulants 06/22/2010  . SHORTNESS OF BREATH 12/28/2008  . CELLULITIS, MILD 12/11/2008  . HYPERLIPIDEMIA-MIXED 11/16/2008  . Aneurysm of thoracic aorta ([place]) 11/16/2008  . HYPERTHYROIDISM 12/05/2006  . ATRIAL FIBRILLATION 12/05/2006    Palliative Care Assessment & Plan   Patient Profile: 78 y.o. male  with past medical history of CLL, HTN, HLD, RV failure, atrial fibrillation (on Eliquis), s/p multiple ablations, s/p CABG, severe right ventricular failure, recurrent ascites admitted on 04/25/2018 with leg swelling  and weight gain 40 lbs x 1 month. Now inotrope dependent and continues with aggressive diuresis although renal function has improved.   Assessment: Acute on chronic combined CHF Hyponatremia Ascites Pedal  edema PAF AKI Right foot wound/cellulitis Anxiety Depression Insomnia CLL  Recommendations/Plan:  Continue scheduled Norco and Ativan.  Continue scheduled Miralax and Senna  Continue prn magic mouthwash.   Continue current plan of care per attending and cardiology. Milrinone discontinued. Watchful waiting.   Patient/family interested in outpatient palliative referral. RN CM aware.   Code Status: DNR   Code Status Orders  (From admission, onward)         Start     Ordered   05/01/18 1044  Do not attempt resuscitation (DNR)  Continuous    Question Answer Comment  In the event of cardiac or respiratory ARREST Do not call a "code blue"   In the event of cardiac or respiratory ARREST Do not perform Intubation, CPR, defibrillation or ACLS   In the event of cardiac or respiratory ARREST Use medication by any route, position, wound care, and other measures to relive pain and suffering. May use oxygen, suction and manual treatment of airway obstruction as needed for comfort.      05/01/18 1043        Code Status History    Date Active Date Inactive Code Status Order ID Comments User Context   04/25/2018 1143 05/01/2018 1043 Full Code [id number]  Barb Merino, MD Inpatient       Prognosis:   Poor long-term prognosis  Discharge Planning:  To Be Determined  Care plan was discussed with patient, daughter, RN CM  Thank you for allowing the Palliative Medicine Team to assist in the care of this patient.   Time In: 1350 Time Out: 1405 Total Time 15 Prolonged Time Billed no      Greater than 50%  of this time was spent counseling and coordinating care related to the above assessment and plan.  Ihor Dow, FNP-C Palliative Medicine Team  Phone: [phone] Fax: [phone]  Please contact Palliative Medicine Team phone at [phone] for questions and concerns.

## 2018-05-13 NOTE — Care Management Note (Signed)
Case Management Note [name] RN, BSN Transitions of Care Unit 4E- RN Case Manager [phone]  Patient Details  Name: Antonio Carrillo MRN: [id number] Date of Birth: 04-01-1941  Subjective/Objective:    Pt admitted with vol. Overload, HF              Action/Plan: PTA pt lived at home with wife, referral received for transition of care needs, PC community referral. CM spoke with pt and spouse at bedside, discussed pt's goal to return home with GOL. Pt reports that he has needed DME at home for now, is interested in [facility] referral at time of discharge discussed what this type of referral means and what type of services are offered vs Hospice services in the home. Pt and wife state that they would like a referral to [facility] for PC on discharge- CM will call and make this referral prior to discharge. Also discussed [place] services and private duty care- pt has used [facility] [place] in past , list provided Per CMS guidelines from medicare.gov website with star ratings (copy placed in shadow chart) for them to review for choice- once orders placed CM will f/u for choice and make any referrals needed. Offered to [employer] options for private duty- wife and pt both declined at this time stating that they could do this and f/u with researching resources for this. CM will continue to follow for transition of care needs.   Expected Discharge Date:                  Expected Discharge Plan:  [place]  In-House Referral:  Hospice / Palliative Care  Discharge planning Services  CM Consult  Post Acute Care Choice:  Durable Medical Equipment, Home Health, Hospice Choice offered to:  Patient, Spouse  DME Arranged:  3-N-1 DME Agency:  [place]:    [place] Agency:     Status of Service:  In process, will continue to follow  If discussed at Long Length of Stay Meetings, dates discussed:    Discharge Disposition: home/home health   Additional Comments:  05/13/18- 1500-  [name] RN, CM- f/u done with pt and daughter at bedside for transition of care needs- pt has been weaned off Milrinone drip today with plan for transition home if does well possibly 2/12/2- discussed HH and DME needs with pt and daughter, confirmed plans for referral to [facility] for PC f/u in the home. CM will call in am to make PC referral to [name] per pt request. Discussed choice for [facility] agencies based on CMS list that was provided on previous visit - pt and daughter have provided CM with top three agencies to f/u with for [facility] needs- CM will call these agencies based on order of preference. Will need HH orders for RN/PT prior to discharge.  Pt would also like a 3n1-DME, has RW and transport chair at home. Asking about a w/c- however if pt got RW in last 5 years medicare will not cover w/c at this time- CM will check into this to see when RW was received. Benefits check was completed on Eliquis and per pt copay cost came out to what he has been paying PTA. Discussed using [facility] pharmacy for filling meds prior to discharge- pt and daughter are interested in using [facility] for ease of transition home on discharge- MD would need to send scripts for discharge to [facility] to fill and deliver to bedside.  CM will  f/u with pt and family tomorrow for further transition planning.   Dawayne Patricia, RN 05/13/2018, 4:47 PM

## 2018-05-13 NOTE — Progress Notes (Addendum)
Patient ID: Antonio Carrillo, male   DOB: [birth year], 78 y.o.   MRN: [id number]     Advanced Heart Failure Rounding Note  PCP-Cardiologist: No primary care provider on file.   Subjective:    Admitted from clinic 04/25/2018 with marked volume overloaded and abdominal distention.   Had LLE hematoma 05/08/18. Ortho aspirated 15 cc of serosanguinous fluid. Ortho saw him again today and aspirated >150 mls from hematoma with some purulence. Culture sent. Considering PO abx. Hemoglobin 8.6 -> 8.0. Afebrile. WBC trending down 11.7.   Remains on milrinone 0.125. Coox 69%.   Remains on bumex 3 mg BID (home dose). CVP 9-10. Good diuresis with I/O negative 1.1 L. Weight down 1 lb.   LLE pain much improved. No longer constipated. Hopes to go home tomorrow.   Echo 04/25/2018: LV EF 55-60%, D-shaped interventricular septum with RV enlargement and dysfunction, severe TR.   Objective:   Weight Range: 86.4 kg Body mass index is 26.57 kg/m.   Vital Signs:   Temp:  [97.4 F (36.3 C)-98.4 F (36.9 C)] 97.4 F (36.3 C) (02/10 0854) Pulse Rate:  [64-70] 64 (02/10 0854) Resp:  [13-23] 17 (02/10 0854) BP: (109-115)/(61-67) 115/67 (02/10 0854) SpO2:  [94 %-98 %] 98 % (02/10 0854) Weight:  [86.4 kg] 86.4 kg (02/10 0407) Last BM Date: 05/12/18  Weight change: Filed Weights   05/11/18 0717 05/12/18 0400 05/13/18 0407  Weight: 88 kg 87 kg 86.4 kg   Intake/Output:   Intake/Output Summary (Last 24 hours) at 05/13/2018 1021 Last data filed at 05/13/2018 0730 Gross per 24 hour  Intake 882.6 ml  Output 2505 ml  Net -1622.4 ml    Physical Exam   General: Elderly. No resp difficulty. HEENT: Normal Neck: Supple. JVP 9-10. Carotids 2+ bilat; no bruits. No thyromegaly or nodule noted. Cor: PMI nondisplaced. RRR, No M/G/R noted Lungs: CTAB, normal effort. Abdomen: Soft, non-tender, non-distended, no HSM. No bruits or masses. +BS  Extremities: No cyanosis, clubbing, or rash. R and LLE no edema. BLE wrapped  ACE Neuro: Alert & orientedx3, cranial nerves grossly intact. moves all 4 extremities w/o difficulty. Affect pleasant  Telemetry   NSR 60s 1st degree AV block, personally reviewed.  Labs    CBC Recent Labs    05/12/18 0456 05/13/18 0538  WBC 14.6* 11.7*  HGB 8.7* 8.0*  HCT 27.8* 26.2*  MCV 86.3 87.0  PLT 196 573   Basic Metabolic Panel Recent Labs    05/12/18 0456 05/13/18 0538  NA 129* 130*  K 3.8 3.6  CL 88* 92*  CO2 28 30  GLUCOSE 146* 138*  BUN 27* 25*  CREATININE 1.00 0.98  CALCIUM 8.5* 8.1*   Liver Function Tests Recent Labs    05/12/18 0456 05/13/18 0538  AST 35 24  ALT 25 22  ALKPHOS 161* 135*  BILITOT 0.8 0.7  PROT 5.2* 4.9*  ALBUMIN 2.6* 2.5*   No results for input(s): LIPASE, AMYLASE in the last 72 hours. Cardiac Enzymes No results for input(s): CKTOTAL, CKMB, CKMBINDEX, TROPONINI in the last 72 hours.  BNP: BNP (last 3 results) Recent Labs    04/25/18 0949  BNP 1,544.3*    ProBNP (last 3 results) No results for input(s): PROBNP in the last 8760 hours.   D-Dimer No results for input(s): DDIMER in the last 72 hours. Hemoglobin A1C No results for input(s): HGBA1C in the last 72 hours. Fasting Lipid Panel No results for input(s): CHOL, HDL, LDLCALC, TRIG, CHOLHDL, LDLDIRECT in the last 72  hours. Thyroid Function Tests No results for input(s): TSH, T4TOTAL, T3FREE, THYROIDAB in the last 72 hours.  Invalid input(s): FREET3  Other results:   Imaging    No results found.   Medications:     Scheduled Medications: . allopurinol  100 mg Oral Daily  . amiodarone  400 mg Oral BID  . apixaban  5 mg Oral BID  . bumetanide  3 mg Oral BID  . diclofenac sodium  2 g Topical QID  . feeding supplement (ENSURE ENLIVE)  237 mL Oral BID BM  . HYDROcodone-acetaminophen  1 tablet Oral Q8H  . iron polysaccharides  150 mg Oral Daily  . LORazepam  0.5 mg Oral TID  . multivitamin with minerals  1 tablet Oral Daily  . polyethylene glycol   17 g Oral BID  . senna-docusate  1 tablet Oral BID  . sertraline  200 mg Oral Daily  . sodium chloride flush  10-40 mL Intracatheter Q12H  . spironolactone  50 mg Oral BID  . terbinafine  250 mg Oral Daily    Infusions: . milrinone 0.125 mcg/kg/min (05/12/18 1507)    PRN Medications: benzocaine, bisacodyl, HYDROcodone-acetaminophen, lidocaine, magic mouthwash w/lidocaine, morphine injection, ondansetron **OR** ondansetron (ZOFRAN) IV, prochlorperazine, sodium chloride flush, white petrolatum  Patient Profile   Antonio Carrillo is a 78 y.o. male with h/o AF s/p several ablations, aortic root aneurysm, s/p aortic root replacement with CABG 1, RV failure, CLL, and ascites.   Sent for admission from HF clinic 04/25/2018 with marked volume overload on exam, RLE wound, and marked ascites.   Assessment/Plan   1. 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 cMRI 4/18 at [facility] with normal LV function. RV severely dilated and mild RV dysfunction.  Echo this admission with LV EF 55-60%, RV dilated and dysfunctional, severe TR, D-shaped septum. - Volume stable. CVP 9-10. - Continue bumex 3 mg BID.  - Coox 69% on milrinone 0.125 mcg/kg/min. Stop milrinone.   - Continue digoxin 0.125 mg daily - Continue bumex 3 mg BID.   - Continue spironolactone 50 mg BID.  - Long-term prognosis is quite poor.  With RV failure, not candidate for LVAD and also would be poor HD candidate. Palliative Care following. He has been resistant to [place] discussions. He is hopeful that he may get some recovery. He was very satisfied with his QOL recently and would like to get back to that.  2. PAF: S/p multiple ablations and MAZE. Atrial fibrillation on 2/7, back to NSR on amiodarone.  - Continue amiodarone 400 mg bid.  - Eliquis restarted after holding for leg hematoma.  Hgb 8.6 -> 8.0 today. Had hematoma aspirated again today with 150 mls out.  3. CLL: Per  Oncology.  Now s/p pelvic XRT. No change.  4. CAD and CABG x 1: No chest pain.   - No ASA given apixaban use.     5. Aortic root aneurysm s/p repair  6. LLE hematoma: Ortho following. Aspirated 15 cc serosanguinous fluid.  Eliquis restarted with stable hemoglobin. No role for surgery. Continue compressive wrap.   - Ortho saw again this am and aspirated 150 mls from hematoma. Sent for culture. If culture positive, they plan on starting abx.  7. Iron deficiency Anemia: No overt bleeding. Hemoglobin 8.7 -> 8.0 today - Received feraheme 11/25.  8. Ascites: Persists.  S/p 2 paracenteses recently with 4.1 and 1.3 L out. 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 Cytology from paracentesis has repeatedly been negative for malignancy - Consider repeat paracentesis as needed. No change.  9. Hyponatremia: Sodium 130 today 10. Hypokalemia: K 3.6 today.  11. Hypotension: Stable. SBPs 100-110s - Consider midodrine as needed 12. LLE cellulitis: Improving. Finished Vancomycin.  No change.  13. ? Glossitis: Chronic, intermittent occurrence since his cancer treatment - Continue magic mouthwash. No change.  14. Constipation: Enema per primary service. Resolved.    Georgiana Shore, NP  05/13/2018 10:21 AM   Advanced Heart Failure Team Pager [phone] (M-F; 7a - 4p)  Please contact [facility] for night-coverage after hours (4p -7a ) and weekends on [email or web address]  Patient seen and examined with the above-signed Advanced Practice Provider and/or Housestaff. I personally reviewed laboratory data, imaging studies and relevant notes. I independently examined the patient and formulated the important aspects of the plan. I have edited the note to reflect any of my changes or salient points. I have personally discussed the plan with the patient and/or family.  Remains on milrinone 0.125 and bumex 3mg  bid. Volume status much improved. Weight down over 30 pounds. CVP 8. Will stop milrinone and follow for  48 hours. Remains in NSR on po amio. No breakthrough AF. Had more bleeding in LLE today and 150 cc of hematoma expressed by ortho. I will hold Eliquis for 24 hours.   Glori Bickers, MD  1:30 PM

## 2018-05-13 NOTE — Care Management (Signed)
#  [id number]   S/W  DOMINIQUI  @ [facility] # [phone]   ELIQUIS  5 MG BID  COVER- YES CO-PAY- $ 99.45 TIER- 2 DRUG PRIOR APPROVAL- NO  DEDUCTIBLE : NOT MET  PREFERRED PHARMACY : YES [facility] M[facility]O 90 DAY SUPPLY FOR M/O $ 90.00

## 2018-05-13 NOTE — Progress Notes (Signed)
Ortho Trauma Progress Note:  Patient with significant increase in pain over weekend. Wound developed on LE.  LLE: Area of hematoma not increased in size. Area in center with 0.5 cm wound with active drainage. Fluctuance around this area and was able to express >19mL of hematoma with some purulence. No foul smell.   Patient noted much improved feeling of leg after decompression  A/P 78 yo male with LLE hematoma with apparent infection  WBAT Cultures sent from hematoma-will likely need PO antibiotics Wound care to LLE-BID dressing changes with gauze, ABD pad and ACE wrap. Will eval tomorrow AM to see if improved.  Shona Needles, MD [facility] [phone] (phone)

## 2018-05-13 NOTE — Consult Note (Signed)
[facility] Nurse wound consult note Reason for Consult:Patient has been seen by two of my partners. Orthopedics aspirated LLE hematoma last week and has indicated there is no further role.  According to patient, wound is worse than last seen on 05/09/18. I will cleanse, redress and provide conservative orders.  Please reconsult Ortho if you agree. Wound type:Trauma (RLE), hematoma (LLE) Pressure Injury POA: NA Measurement: RLE:  10cm x 2.2cm x 0.2cm with 90% red, 10% yellow wound bed improving with twice daily NS dressings and compression via ACE bandage. LLE with increased erythema and pain, also bleeding since patient and daughter last viewed. Wound bed: Drainage (amount, consistency, odor) serous from RLE, serosanguinous in a small amount from LLE. Periwound: LLE:  Edematous, erythematous Dressing procedure/placement/frequency: Continue NS twice daily dressings to RLE.  I have provided Nursing with guidance via the orders for wound care to the LLE site of hematoma, but suggest reconsultation with Ortho (relaying patient and daughter request) as they indicate there has been a change. If you agree, please order/reconsult.  [facility] nursing team will not follow, but will remain available to this patient, the nursing and medical teams.  Please re-consult if needed. Thanks, Maudie Flakes, MSN, RN, [place], Arther Abbott  Pager# [phone]

## 2018-05-13 NOTE — Progress Notes (Signed)
Nutrition Follow Up  DOCUMENTATION CODES:   Not applicable  INTERVENTION:    Ensure Enlive po BID, each supplement provides 350 kcal and 20 grams of protein  NEW NUTRITION DIAGNOSIS:   Increased nutrient needs related to chronic illness, wound healing as evidenced by estimated needs, ongoing  GOAL:   Patient will meet greater than or equal to 90% of their needs, progressing  MONITOR:   PO intake, Supplement acceptance, Weight trends, Labs, I & O's  ASSESSMENT:   78 y.o. male with medical history significant of CLL, HTN, hyperlipidemia, A. 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 Patient presented to the ED from Cardiology office with profound leg swelling and not responding to oral diuretics at home.   Pt with acute on chronic CHF with prominent RV failure. Nearing end stage RV failure.  Average PO intake good at 70% per flowsheet records. Taking some of his Ensure Enlive supplements. Labs & medications reviewed. Na 130 (L).  Palliative Medicine Team following. Poor long-term prognosis.  Diet Order:   Diet Order            Diet Heart Room service appropriate? Yes; Fluid consistency: Thin; Fluid restriction: 1200 mL Fluid  Diet effective now             EDUCATION NEEDS:   Education needs have been addressed  Skin:  Skin Assessment: Skin Integrity Issues: Skin Integrity Issues:: Other (Comment) Other: LLE hematoma  Last BM:  2/10   Intake/Output Summary (Last 24 hours) at 05/13/2018 1722 Last data filed at 05/13/2018 1612 Gross per 24 hour  Intake 858 ml  Output 3075 ml  Net -2217 ml   Height:   Ht Readings from Last 1 Encounters:  04/25/18 5\' 11"  (1.803 m)   Weight:   Wt Readings from Last 1 Encounters:  05/13/18 86.4 kg   Ideal Body Weight:  78.18 kg  BMI:  Body mass index is 26.57 kg/m.  Estimated Nutritional Needs:   Kcal:  1900-2100  Protein:  85-100 gm  Fluid:  per MD  Arthur Holms, RD,  LDN Pager #: [phone] After-Hours Pager #: [phone]

## 2018-05-13 NOTE — Progress Notes (Signed)
Patient refusing CPAP.  RT will continue to monitor.  

## 2018-05-13 NOTE — Progress Notes (Signed)
PROGRESS NOTE    Antonio Carrillo  HQI:[id number] DOB: Dec 25, 1940 DOA: 04/25/2018 PCP: Patient, No Pcp Per   Brief Narrative:77 y.o.malewith medical history significant ofCLL, hypertension, hyperlipidemia, paroxysmal A. 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 Patient has extensive medical problems. He has history of atrial fibrillation with multiple ablations, aortic root replacement with CABG, right ventricular heart failure and CLL. Patient has been going through multiple diuretics to help with his leg swellings. According to the patient, it was manageable until about a month now. Patient stated that his legs are hugeand edematous, they were skinny before Christmas. Patient gained about 40 pounds of weight since 1 month. He had some ascites in the past, however since last 1 month he had to have it tapped 3 times. Patient also has persistent nausea but no vomiting. Patient also has pain due to tightness of the legs. He also injured his right leg at home and had to have sutures placed which are draining liquid around the suture lines. Patient denies any chest pain or palpitation. He is short of breath on exertion but none at rest. He denies orthopnea or paroxysmal nocturnal dyspnea. Patient recently on Bumex. He went to see cardiologist today in the office, due to significant symptoms while sent to inpatient admission. Patient examined at the bedside. He was on room air. His blood pressures are stable. WBC count is elevated, however chronic. His potassium was 5.5, he is on Aldactone and potassium supplement. His hemoglobin is more or less at baseline. Creatinine is 2.69 which is significantly above his baseline of 1.4.   Assessment & Plan:   Principal Problem:   Acute on chronic combined systolic and  diastolic CHF (congestive heart failure) (HCC) Active Problems:   ATRIAL FIBRILLATION   AKI (acute kidney injury) (HCC)   Hyperkalemia   Ascites   CLL (chronic lymphocytic leukemia) (HCC)   Anemia   CHF (congestive heart failure) (HCC)   Palliative care encounter   Right foot injury   Chronic bilateral low back pain without sciatica   Palliative care by specialist   Anxiety state   Leg hematoma, left, initial encounter  Acute on chronic combined systolic and diastolic heart failure  -with hyponatremia, ascites and pedal edema -Cardiology and palliative care consulted and appreciated -Patient was on IV Lasix and transitioned to Bumex 3mg  BID -Was placed on a milrinone drip - cardiology discontinuing today- also starting digoxin -Monitor intake and output, daily weights -Echocardiogram 04/25/2018 showed normal LV systolic function, mild LVE, mild AI, mild  MR, severe biatrial enlargement, mild RVE with moderate RV dysfunction, severe TR -Cardiology stopping milrinone today to 1020 and monitor him for 48 hours.  Paroxysmal atrial fibrillation -Patient has had multiple ablations and MAZE in the past -Cardiology currently following -Continue amiodarone -digoxin restarted today -Eliquis being on hold for another 24 hours due to bleeding in the lower extremity yesterday and 150 cc of hematoma expressed by orthopedics.  LLE hematoma -spontaneous -LE doppler negative for DVT -CT LLE: No CT evidence of left lower extremity DVT.  Probable subcutaneous hematoma anterior laterally in the proximal left lower leg.  Generalized subcutaneous edema in the distal left lower leg, possibly cellulitis. -patient was briefly placed on vancomycin again on 2/5, however this is less likely infectious, will discontinue vancomycin (recent AKI) and monitor- patient also afebrile with improvement of leukocytosis -Orthopedics  consulted and appreciated. Dr. Doreatha Martin able to aspirate 15cc serosanguinous fluid on  2/5.   Acute kidney injury -On admission, creatinine was 2.5, baseline less than 1 -Creatinine currently 1 -Continue to monitor BMP  Hypokalemia -Likely secondary to diuresis -Resolved -Continue to monitor BMP    Right foot wound/dorsal laceration with leukocytosis -was seen by orthopedics at [facility] on 04/17/2018 and given Keflex -On 04/26/2018-  Patient noted to have swelling with weeping profusely and pedal edema -Orthopedic surgery consulted and appreciated, Dr. Doreatha Martin remove sutures and recommended antibiotics with doxycycline which is now been discontinued -Wound care consulted -Continue wet-to-dry dressings -Patient with continued erythema and swelling-cardiology started vancomycin on 05/02/2018- however discontinued on 05/07/2018  Anxiety/depression -Continue Zoloft, Ativan  Chronic anemia/iron deficiency anemia -hemoglobin currently 8.7 -hemoglobin has been ranging from 7-8 -suspect there will be a drop given hematoma -Continue to monitor CBC  CLL -Diagnosed in 2015 and followed at [place] note from 04/01/2018 recommends observation only after his treatment and repeat CT in 3 months  Painful mouth and throat -Has been a chronic issue for several years and patient has been undergoing treatment for CLL -Continue Magic mouthwash with lidocaine 4 times daily PRN -Consider outpatient ENT or GI evaluations -No evidence of thrush  OSA -Continue CPAP nightly  Gout -Continue allopurinol  Left knee pain with arthritis -Continue Voltaren gel  Elevated TSH -Likely sick euthyroid, free T4 normal  Constipation -resolved, continue currently regimen -was given dose of oral lactulose on 05/11/2018  Abnormal LFTs -Resolved  Hyponatremia -Continue to monitor, currently 129  Thrombocytopenia -resolved, platelets 196 -Continue to monitor   Hyperglycemia -Hemoglobin A1c 6.1, continue insulin sliding scale, CBG  monitoring  Goals of care and chronic pain -Patient currently DNR -Palliative care consulted and appreciate -pain is getting controlled, however patient with constipation. Will decrease scheduled norco to TID; continue norco and morphine PRN -pain seems to be better controlled   Nutrition Problem: Increased nutrient needs Etiology: chronic illness, wound healing     Signs/Symptoms: estimated needs    Interventions: Ensure Enlive (each supplement provides 350kcal and 20 grams of protein), MVI  Estimated body mass index is 26.57 kg/m as calculated from the following:   Height as of this encounter: 5\' 11"  (1.803 m).   Weight as of this encounter: 86.4 kg.  DVT prophylaxis: Eliquis is on hold due to hematoma to the lower extremity Code Status: DO NOT RESUSCITATE Family Communication: Daughter at bedside Disposition Plan: Pending clinical improvement and cardiology clearance Consultants: Cardiology palliative care and Ortho  Procedures: Picc and suture removal Antimicrobials:  Subjective: Patient resting in bed daughter by the bedside he reports he is feeling better  Objective: Vitals:   05/13/18 0854 05/13/18 0900 05/13/18 1100 05/13/18 1242  BP: 115/67   107/66  Pulse: 64   64  Resp: 17 18 20 20   Temp: (!) 97.4 F (36.3 C)   97.8 F (36.6 C)  TempSrc: Oral   Oral  SpO2: 98%   97%  Weight:      Height:        Intake/Output Summary (Last 24 hours) at 05/13/2018 1401 Last data filed at 05/13/2018 1200 Gross per 24 hour  Intake 618 ml  Output 2175 ml  Net -1557 ml   Filed Weights   05/11/18 0717 05/12/18 0400 05/13/18 0407  Weight: 88 kg 87 kg 86.4 kg    Examination:  General exam: Appears calm and comfortable  Respiratory system: Decreased breath sounds in  the bases to auscultation. Respiratory effort normal. Cardiovascular system: S1 & S2 heard, RRR. No JVD, murmurs, rubs, gallops or clicks. No pedal edema. Gastrointestinal system: Abdomen is  nondistended, soft and nontender. No organomegaly or masses felt. Normal bowel sounds heard. Central nervous system: Alert and oriented. No focal neurological deficits. Extremities: Trace edema. Skin: No rashes, lesions or ulcers Psychiatry: Judgement and insight appear normal. Mood & affect appropriate.     Data Reviewed: I have personally reviewed following labs and imaging studies  CBC: Recent Labs  Lab 05/07/18 0448 05/08/18 0356 05/09/18 1130 05/10/18 0439 05/11/18 0812 05/12/18 0456 05/13/18 0538  WBC 12.5* 12.4* 18.2* 14.1* 12.9* 14.6* 11.7*  NEUTROABS 10.1* 9.9*  --   --   --   --   --   HGB 8.2* 7.9* 7.7* 8.1* 8.2* 8.7* 8.0*  HCT 26.6* 26.0* 25.1* 26.4* 26.8* 27.8* 26.2*  MCV 92.4 89.3 90.9 91.0 89.6 86.3 87.0  PLT 146* 145* 149* 139* 146* 196 914   Basic Metabolic Panel: Recent Labs  Lab 05/07/18 0448 05/08/18 0356 05/09/18 0413 05/10/18 0439 05/11/18 0812 05/12/18 0456 05/13/18 0538  NA 132* 132* 131* 131* 131* 129* 130*  K 4.6 4.6 4.8 4.6 3.6 3.8 3.6  CL 89* 90* 91* 90* 87* 88* 92*  CO2 32 34* 31 31 30 28 30   GLUCOSE 227* 146* 152* 124* 165* 146* 138*  BUN 45* 44* 38* 43* 32* 27* 25*  CREATININE 1.12 1.19 1.20 1.20 1.07 1.00 0.98  CALCIUM 8.7* 8.8* 8.5* 8.5* 8.5* 8.5* 8.1*  MG 2.2 2.2  --   --   --   --   --   PHOS 2.6 2.7  --   --   --   --   --    GFR: Estimated Creatinine Clearance: 65.1 mL/min (by C-G formula based on SCr of 0.98 mg/dL). Liver Function Tests: Recent Labs  Lab 05/09/18 0413 05/10/18 0439 05/11/18 0812 05/12/18 0456 05/13/18 0538  AST 28 33 30 35 24  ALT 23 26 24 25 22   ALKPHOS 141* 137* 145* 161* 135*  BILITOT 1.0 0.9 0.7 0.8 0.7  PROT 5.1* 5.2* 5.0* 5.2* 4.9*  ALBUMIN 2.7* 2.7* 2.6* 2.6* 2.5*   No results for input(s): LIPASE, AMYLASE in the last 168 hours. No results for input(s): AMMONIA in the last 168 hours. Coagulation Profile: No results for input(s): INR, PROTIME in the last 168 hours. Cardiac Enzymes: No  results for input(s): CKTOTAL, CKMB, CKMBINDEX, TROPONINI in the last 168 hours. BNP (last 3 results) No results for input(s): PROBNP in the last 8760 hours. HbA1C: No results for input(s): HGBA1C in the last 72 hours. CBG: No results for input(s): GLUCAP in the last 168 hours. Lipid Profile: No results for input(s): CHOL, HDL, LDLCALC, TRIG, CHOLHDL, LDLDIRECT in the last 72 hours. Thyroid Function Tests: No results for input(s): TSH, T4TOTAL, FREET4, T3FREE, THYROIDAB in the last 72 hours. Anemia Panel: No results for input(s): VITAMINB12, FOLATE, FERRITIN, TIBC, IRON, RETICCTPCT in the last 72 hours. Sepsis Labs: No results for input(s): PROCALCITON, LATICACIDVEN in the last 168 hours.  Recent Results (from the past 240 hour(s))  Aerobic Culture (superficial specimen)     Status: None (Preliminary result)   Collection Time: 05/13/18  8:30 AM  Result Value Ref Range Status   Specimen Description WOUND  Final   Special Requests HEMATOMA  Final   Gram Stain   Final    MODERATE WBC PRESENT, PREDOMINANTLY PMN MODERATE GRAM NEGATIVE RODS FEW  GRAM POSITIVE RODS Performed at [facility], [place] [address] [place],  [address]    Culture PENDING  Incomplete   Report Status PENDING  Incomplete         Radiology Studies: No results found.      Scheduled Meds: . allopurinol  100 mg Oral Daily  . amiodarone  400 mg Oral BID  . bumetanide  3 mg Oral BID  . diclofenac sodium  2 g Topical QID  . digoxin  0.125 mg Oral Daily  . feeding supplement (ENSURE ENLIVE)  237 mL Oral BID BM  . HYDROcodone-acetaminophen  1 tablet Oral Q8H  . iron polysaccharides  150 mg Oral Daily  . LORazepam  0.5 mg Oral TID  . multivitamin with minerals  1 tablet Oral Daily  . polyethylene glycol  17 g Oral BID  . senna-docusate  1 tablet Oral BID  . sertraline  200 mg Oral Daily  . sodium chloride flush  10-40 mL Intracatheter Q12H  . spironolactone  50 mg Oral BID  . terbinafine   250 mg Oral Daily   Continuous Infusions:   LOS: 18 days     Georgette Shell, MD [facility]  If 7PM-7AM, please contact night-coverage [email or web address] Password [email or web address] 05/13/2018, 2:01 PM

## 2018-05-14 LAB — COMPREHENSIVE METABOLIC PANEL
ALBUMIN: 2.6 g/dL — AB (ref 3.5–5.0)
ALT: 22 U/L (ref 0–44)
AST: 28 U/L (ref 15–41)
Alkaline Phosphatase: 145 U/L — ABNORMAL HIGH (ref 38–126)
Anion gap: 11 (ref 5–15)
BILIRUBIN TOTAL: 0.5 mg/dL (ref 0.3–1.2)
BUN: 25 mg/dL — ABNORMAL HIGH (ref 8–23)
CO2: 30 mmol/L (ref 22–32)
Calcium: 8.6 mg/dL — ABNORMAL LOW (ref 8.9–10.3)
Chloride: 90 mmol/L — ABNORMAL LOW (ref 98–111)
Creatinine, Ser: 0.9 mg/dL (ref 0.61–1.24)
GFR calc Af Amer: >60 mL/min (ref >60)
GFR calc non Af Amer: >60 mL/min (ref >60)
Glucose, Bld: 135 mg/dL — ABNORMAL HIGH (ref 70–99)
POTASSIUM: 3.8 mmol/L (ref 3.5–5.1)
Sodium: 131 mmol/L — ABNORMAL LOW (ref 135–145)
Total Protein: 5.1 g/dL — ABNORMAL LOW (ref 6.5–8.1)

## 2018-05-14 LAB — COOXEMETRY PANEL
CARBOXYHEMOGLOBIN: 1.6 % — AB (ref 0.5–1.5)
Methemoglobin: 1.2 % (ref 0.0–1.5)
O2 Saturation: 55.7 %
Total hemoglobin: 10.6 g/dL — ABNORMAL LOW (ref 12.0–16.0)

## 2018-05-14 LAB — CBC
HCT: 27.4 % — ABNORMAL LOW (ref 39.0–52.0)
Hemoglobin: 8.5 g/dL — ABNORMAL LOW (ref 13.0–17.0)
MCH: 27.4 pg (ref 26.0–34.0)
MCHC: 31 g/dL (ref 30.0–36.0)
MCV: 88.4 fL (ref 80.0–100.0)
Platelets: 189 10*3/uL (ref 150–400)
RBC: 3.1 MIL/uL — AB (ref 4.22–5.81)
RDW: 16.6 % — ABNORMAL HIGH (ref 11.5–15.5)
WBC: 8.9 10*3/uL (ref 4.0–10.5)
nRBC: 0 % (ref 0.0–0.2)

## 2018-05-14 MED ORDER — CEPHALEXIN 500 MG PO CAPS
500.0000 mg | ORAL_CAPSULE | Freq: Two times a day (BID) | ORAL | Status: DC
  Administered 2018-05-14 – 2018-05-15 (×3): 500 mg via ORAL
  Filled 2018-05-14 (×3): qty 1

## 2018-05-14 NOTE — Progress Notes (Addendum)
Patient ID: Antonio Carrillo, male   DOB: Apr 05, 1940, 78 y.o.   MRN: [id number]     Advanced Heart Failure Rounding Note  PCP-Cardiologist: No primary care provider on file.   Subjective:    Admitted from clinic 04/25/2018 with marked volume overloaded and abdominal distention.   Had LLE hematoma 05/08/18. Ortho aspirated 15 cc of serosanguinous fluid. Ortho saw him again today and aspirated >150 mls from hematoma with some purulence. Hemoglobin 8.5 today. Afebrile. WBC trending down 8.9. Culture showed mod WBC, mod gram negative rods, and few gram positive rods. Eliquis on hold. Planning to start Keflex per ortho.   Milrinone turned off 2/10. Coox 56%.  Remains on bumex 3 mg BID (home dose). CVP not hooked up. Good diuresis with I/O negative -2.2 L. Weight down 2 lbs. Creatinine stable.   Denies CP or SOB. Feels great this morning. No more pain in LLE.  Echo 04/25/2018: LV EF 55-60%, D-shaped interventricular septum with RV enlargement and dysfunction, severe TR.   Objective:   Weight Range: 85.3 kg Body mass index is 26.22 kg/m.   Vital Signs:   Temp:  [96.5 F (35.8 C)-97.8 F (36.6 C)] 96.5 F (35.8 C) (02/11 0757) Pulse Rate:  [62-65] 64 (02/11 0757) Resp:  [12-21] 13 (02/11 0757) BP: (104-128)/(66-84) 104/66 (02/11 0757) SpO2:  [97 %-100 %] 97 % (02/11 0757) Weight:  [85.3 kg] 85.3 kg (02/11 0356) Last BM Date: 05/13/18  Weight change: Filed Weights   05/12/18 0400 05/13/18 0407 05/14/18 0356  Weight: 87 kg 86.4 kg 85.3 kg   Intake/Output:   Intake/Output Summary (Last 24 hours) at 05/14/2018 0819 Last data filed at 05/14/2018 0819 Gross per 24 hour  Intake 1135 ml  Output 2775 ml  Net -1640 ml    Physical Exam   General:Elderly. No resp difficulty. HEENT: Normal Neck: Supple. JVP 7-8. Carotids 2+ bilat; no bruits. No thyromegaly or nodule noted. Cor: PMI nondisplaced. RRR, No M/G/R noted Lungs: CTAB, normal effort. Abdomen: Soft, non-tender, non-distended, no  HSM. No bruits or masses. +BS  Extremities: No cyanosis, clubbing, or rash. BLE wrapped in ACE. RUE PICC line.  Neuro: Alert & orientedx3, cranial nerves grossly intact. moves all 4 extremities w/o difficulty. Affect pleasant   Telemetry   NSR 60s, personally reviewed.  Labs    CBC Recent Labs    05/13/18 0538 05/14/18 0500  WBC 11.7* 8.9  HGB 8.0* 8.5*  HCT 26.2* 27.4*  MCV 87.0 88.4  PLT 176 176   Basic Metabolic Panel Recent Labs    05/13/18 0538 05/14/18 0500  NA 130* 131*  K 3.6 3.8  CL 92* 90*  CO2 30 30  GLUCOSE 138* 135*  BUN 25* 25*  CREATININE 0.98 0.90  CALCIUM 8.1* 8.6*   Liver Function Tests Recent Labs    05/13/18 0538 05/14/18 0500  AST 24 28  ALT 22 22  ALKPHOS 135* 145*  BILITOT 0.7 0.5  PROT 4.9* 5.1*  ALBUMIN 2.5* 2.6*   No results for input(s): LIPASE, AMYLASE in the last 72 hours. Cardiac Enzymes No results for input(s): CKTOTAL, CKMB, CKMBINDEX, TROPONINI in the last 72 hours.  BNP: BNP (last 3 results) Recent Labs    04/25/18 0949  BNP 1,544.3*    ProBNP (last 3 results) No results for input(s): PROBNP in the last 8760 hours.   D-Dimer No results for input(s): DDIMER in the last 72 hours. Hemoglobin A1C No results for input(s): HGBA1C in the last 72 hours. Fasting Lipid  Panel No results for input(s): CHOL, HDL, LDLCALC, TRIG, CHOLHDL, LDLDIRECT in the last 72 hours. Thyroid Function Tests No results for input(s): TSH, T4TOTAL, T3FREE, THYROIDAB in the last 72 hours.  Invalid input(s): FREET3  Other results:   Imaging    No results found.   Medications:     Scheduled Medications: . allopurinol  100 mg Oral Daily  . amiodarone  400 mg Oral BID  . bumetanide  3 mg Oral BID  . diclofenac sodium  2 g Topical QID  . digoxin  0.125 mg Oral Daily  . feeding supplement (ENSURE ENLIVE)  237 mL Oral BID BM  . HYDROcodone-acetaminophen  1 tablet Oral Q8H  . iron polysaccharides  150 mg Oral Daily  . LORazepam   0.5 mg Oral TID  . multivitamin with minerals  1 tablet Oral Daily  . polyethylene glycol  17 g Oral BID  . senna-docusate  1 tablet Oral BID  . sertraline  200 mg Oral Daily  . sodium chloride flush  10-40 mL Intracatheter Q12H  . spironolactone  50 mg Oral BID  . terbinafine  250 mg Oral Daily    Infusions:   PRN Medications: benzocaine, bisacodyl, HYDROcodone-acetaminophen, lidocaine, magic mouthwash w/lidocaine, morphine injection, ondansetron **OR** ondansetron (ZOFRAN) IV, prochlorperazine, sodium chloride flush, white petrolatum  Patient Profile   Antonio Carrillo is a 78 y.o. male with h/o AF s/p several ablations, aortic root aneurysm, s/p aortic root replacement with CABG 1, RV failure, CLL, and ascites.   Sent for admission from HF clinic 04/25/2018 with marked volume overload on exam, RLE wound, and marked ascites.   Assessment/Plan   1. 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 [facility] 4/[facility] with normal LV function. RV severely dilated and mild RV dysfunction.  Echo this admission with LV EF 55-60%, RV dilated and dysfunctional, severe TR, D-shaped septum. - Volume stable. CVP not hooked up this am. Weight continues to trend down.  - Continue bumex 3 mg BID.  - Coox 56%. Now off milrinone. Stopped 2/10 - Continue digoxin 0.125 mg daily - Continue bumex 3 mg BID.   - Continue spironolactone 50 mg BID.  - Long-term prognosis is quite poor.  With RV failure, not candidate for LVAD and also would be poor HD candidate. Palliative Care following. He has been resistant to [facility] discussions. He is hopeful that he may get some recovery. He was very satisfied with his QOL recently and would like to get back to that.  2. PAF: S/p multiple ablations and MAZE. Atrial fibrillation on 2/7, back to NSR on amiodarone. Remains in NSR. - Continue amiodarone 400 mg bid.  - Eliquis restarted after holding for leg hematoma.   Hgb 8.5. Had hematoma aspirated again today with 150 mls out.  Apixiban on hold currently. Can likely restart tonight.  3. CLL: Per Oncology.  Now s/p pelvic XRT. No change.  4. CAD and CABG x 1: No chest pain.  - No ASA given apixaban use.     5. Aortic root aneurysm s/p repair  6. LLE hematoma: Ortho following. Aspirated 15 cc serosanguinous fluid.  Eliquis restarted with stable hemoglobin. No role for surgery. Continue compressive wrap.   - Ortho saw again 2/10 and aspirated 150 mls from hematoma. Culture showed mod WBC, mod gram negative rods, and few gram positive rods. Final result pending.  - Ortho recommending Keflex.  7. Iron deficiency Anemia: No  overt bleeding. Hemoglobin 8.5 today - Received feraheme 11/25.  8. Ascites: Persists.  S/p 2 paracenteses recently with 4.1 and 1.3 L out. 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 Cytology from paracentesis has repeatedly been negative for malignancy - Consider repeat paracentesis as needed. No change.  9. Hyponatremia: Sodium 131 today 10. Hypokalemia: K 3.8 today.  11. Hypotension: Stable. SBPs 100-120s - Consider midodrine as needed 12. LLE cellulitis: Improving. Finished Vancomycin.  No change - Would cx from hematoma final result pending. Ortho following.   13. ? Glossitis: Chronic, intermittent occurrence since his cancer treatment - Continue magic mouthwash. No change.  14. Constipation: Enema per primary service. Resolved   Georgiana Shore, NP  05/14/2018 8:19 AM   Advanced Heart Failure Team Pager [phone] (M-F; [place])  Please contact [facility] for night-coverage after hours (4p -7a ) and weekends on [email or web address]  Patient seen and examined with the above-signed Advanced Practice Provider and/or Housestaff. I personally reviewed laboratory data, imaging studies and relevant notes. I independently examined the patient and formulated the important aspects of the plan. I have edited the note to reflect any  of my changes or salient points. I have personally discussed the plan with the patient and/or family.  Doing well off milrinone. Volume status looks good. Co-ox ok. Eliquis on hold for one more day. Hopefully home in am. Will need HHRN and HHPT.  Glori Bickers, MD  8:58 AM

## 2018-05-14 NOTE — Plan of Care (Signed)
  Problem: Activity: Goal: Capacity to carry out activities will improve Outcome: Progressing   Problem: Health Behavior/Discharge Planning: Goal: Ability to manage health-related needs will improve Outcome: Progressing   Problem: Clinical Measurements: Goal: Ability to maintain clinical measurements within normal limits will improve Outcome: Progressing Goal: Will remain free from infection Outcome: Progressing Goal: Diagnostic test results will improve Outcome: Progressing Goal: Respiratory complications will improve Outcome: Progressing Goal: Cardiovascular complication will be avoided Outcome: Progressing

## 2018-05-14 NOTE — Progress Notes (Signed)
Pt refusing cpap for the night. RT will continue to monitor as needed. 

## 2018-05-14 NOTE — Progress Notes (Signed)
PROGRESS NOTE    Antonio Carrillo  TDV:[id number] DOB: [birth year] DOA: 04/25/2018 PCP: Patient, No Pcp Per   Brief Narrative:  HPI On 04/25/2018 by Dr. Barb Merino Antonio Carrillo is a 78 y.o. male with medical history significant of CLL, hypertension, hyperlipidemia, paroxysmal A. 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  Patient has extensive medical problems.  He has history of atrial fibrillation with multiple ablations, aortic root replacement with CABG, right ventricular heart failure and CLL.  Patient has been going through multiple diuretics to help with his leg swellings.  According to the patient, it was manageable until about a month now.  Patient stated that his legs are huge and edematous, they were skinny before Christmas.  Patient gained about 40 pounds of weight since 1 month.  He had some ascites in the past, however since last 1 month he had to have it tapped 3 times.  Patient also has persistent nausea but no vomiting.  Patient also has pain due to tightness of the legs.  He also injured his right leg at home and had to have sutures placed which are draining liquid around the suture lines.  Patient denies any chest pain or palpitation.  He is short of breath on exertion but none at rest.  He denies orthopnea or paroxysmal nocturnal dyspnea.  Patient recently on Bumex.  He went to see cardiologist today in the office, due to significant symptoms while sent to inpatient admission. Patient examined at the bedside.  He was on room air.  His blood pressures are stable.  WBC count is elevated, however chronic.  His potassium was 5.5, he is on Aldactone and potassium supplement.  His hemoglobin is more or less at baseline.  Creatinine is 2.69 which is significantly above his baseline of 1.4.  Interim history Admitted  with CHF exacerbation, cardiology/HF team currently consulted and following. Orthopedics also consulted. Patient developed spontaneous hematoma in LLE, eliquis was held.  Assessment & Plan   Acute on chronic combined systolic and diastolic heart failure  -with hyponatremia, ascites and pedal edema -Cardiology and palliative care consulted and appreciated -Patient was on IV Lasix and transitioned to Bumex 3mg  BID -Was placed on a milrinone drip - which has been discontinued -Monitor intake and output, daily weights -Echocardiogram 04/25/2018 showed normal LV systolic function, mild LVE, mild AI, mild  MR, severe biatrial enlargement, mild RVE with moderate RV dysfunction, severe TR  Paroxysmal atrial fibrillation -Patient has had multiple ablations and MAZE in the past -Cardiology currently following -Continue amiodarone, digoxin  -Eliquis has been held again   LLE hematoma -spontaneous -LE doppler negative for DVT -CT LLE: No CT evidence of left lower extremity DVT.  Probable subcutaneous hematoma anterior laterally in the proximal left lower leg.  Generalized subcutaneous edema in the distal left lower leg, possibly cellulitis. -patient was briefly placed on vancomycin again on 2/5, however this is less likely infectious, will discontinue vancomycin (recent AKI) and monitor- patient also afebrile with improvement of leukocytosis -Orthopedics consulted and appreciated. Dr. Doreatha Martin able to aspirate 15cc serosanguinous fluid on 2/5.  >157ml was expressed on 05/13/2018.  -Culture showed mod GNR, few GPR -Discussed with Dr. Doreatha Martin today. Continue ABD pad and ACE wrap. Will start patient on keflex 500mg  BID for 7 days.   Acute kidney injury -Resolved -On admission, creatinine was 2.5, baseline  less than 1 -Creatinine currently 0.9 -Continue to monitor BMP  Hypokalemia -Likely secondary to diuresis -Resolved -Continue to monitor BMP  Right foot wound/dorsal laceration with  leukocytosis -was seen by orthopedics at [facility] on 04/17/2018 and given Keflex -On 04/26/2018-  Patient noted to have swelling with weeping profusely and pedal edema -Orthopedic surgery consulted and appreciated, Dr. Doreatha Martin remove sutures and recommended antibiotics with doxycycline which is now been discontinued -Wound care consulted -Continue wet-to-dry dressings -Patient with continued erythema and swelling-cardiology started vancomycin on 05/02/2018- however discontinued on 05/07/2018  Anxiety/depression -Continue Zoloft, Ativan  Chronic anemia/iron deficiency anemia -hemoglobin currently 8.5- stable -hemoglobin has been ranging from 7-8 -Continue to monitor CBC  CLL -Diagnosed in 2015 and followed at [place] note from 04/01/2018 recommends observation only after his treatment and repeat CT in 3 months  Painful mouth and throat -Has been a chronic issue for several years and patient has been undergoing treatment for CLL -Continue Magic mouthwash with lidocaine 4 times daily PRN -Consider outpatient ENT or GI evaluations -No evidence of thrush  OSA -Continue CPAP nightly  Gout -Continue allopurinol  Left knee pain with arthritis -Continue Voltaren gel  Elevated TSH -Likely sick euthyroid, free T4 normal  Constipation -resolved, continue currently regimen -was given dose of oral lactulose on 05/11/2018  Abnormal LFTs -Resolved  Hyponatremia -Continue to monitor, currently 131  Thrombocytopenia -resolved, platelets 189 -Continue to monitor   Hyperglycemia -Hemoglobin A1c 6.1, continue insulin sliding scale, CBG monitoring  Goals of care and chronic pain -Patient currently DNR -Palliative care consulted and appreciate -pain is getting controlled, however patient with constipation. Continue scheduled norco to TID; continue norco and morphine PRN -pain seems to be better controlled  DVT Prophylaxis  Eliquis on hold given  hematoma  Code Status:DNR  Family Communication: Daugther at bedside  Disposition Plan: Admitted. Dispo pending improvement and further recommendations from cardiology. Possible discharge to home on 05/15/2018- with home health PT and RN.  Consultants Cardiology Palliative care Orthopedic surgery  Procedures  PICC line placement 04/25/2018 Suture removal 04/26/2018  Antibiotics   Anti-infectives (From admission, onward)   Start     Dose/Rate Route Frequency Ordered Stop   05/08/18 1800  vancomycin (VANCOCIN) 1,500 mg in sodium chloride 0.9 % 500 mL IVPB  Status:  Discontinued     1,500 mg 250 mL/hr over 120 Minutes Intravenous Every 24 hours 05/08/18 1713 05/09/18 0821   05/02/18 1400  vancomycin (VANCOCIN) 1,500 mg in sodium chloride 0.9 % 500 mL IVPB  Status:  Discontinued     1,500 mg 250 mL/hr over 120 Minutes Intravenous Every 24 hours 05/02/18 1232 05/07/18 0854   04/30/18 2200  doxycycline (VIBRA-TABS) tablet 100 mg     100 mg Oral Every 12 hours 04/30/18 1401 05/01/18 0808   04/26/18 1330  doxycycline (VIBRA-TABS) tablet 100 mg  Status:  Discontinued     100 mg Oral Every 12 hours 04/26/18 1242 04/30/18 1401   04/25/18 1300  terbinafine (LAMISIL) tablet 250 mg     250 mg Oral Daily 04/25/18 1143        Subjective:   Antonio Carrillo seen and examined today.  Patient feels much better this morning and feels his pain has improved.  Was able to have several bowel movements.  Feels shortness of breath has resolved.  Denies current chest pain, abdominal pain, nausea or vomiting, dizziness or headache.  Objective:   Vitals:   05/13/18 1956 05/14/18 0043 05/14/18 0356 05/14/18  0757  BP: 110/75 126/72 128/84 104/66  Pulse: 62 65 65 64  Resp: 20 (!) 21 12 13   Temp: 97.7 F (36.5 C) (!) 97.5 F (36.4 C) 97.6 F (36.4 C) (!) 96.5 F (35.8 C)  TempSrc: Oral Oral Oral Axillary  SpO2: 97% 98% 100% 97%  Weight:   85.3 kg   Height:        Intake/Output Summary (Last 24 hours)  at 05/14/2018 1011 Last data filed at 05/14/2018 0819 Gross per 24 hour  Intake 1135 ml  Output 2775 ml  Net -1640 ml   Filed Weights   05/12/18 0400 05/13/18 0407 05/14/18 0356  Weight: 87 kg 86.4 kg 85.3 kg   Exam  General: Well developed, chronically ill-appearing, NAD  HEENT: NCAT,  mucous membranes moist.   Neck: Supple  Cardiovascular: S1 S2 auscultated, 2/6 SEM, RRR  Respiratory: Clear to auscultation bilaterally with equal chest rise  Abdomen: Soft, nontender, nondistended, + bowel sounds  Extremities: warm dry without cyanosis clubbing.  LE edema, patient with minimal purulence of the left lower extremity wound.  Neuro: AAOx3, nonfocal  Psych: Pleasant, appropriate mood and affect  Data Reviewed: I have personally reviewed following labs and imaging studies  CBC: Recent Labs  Lab 05/08/18 0356  05/10/18 0439 05/11/18 0812 05/12/18 0456 05/13/18 0538 05/14/18 0500  WBC 12.4*   < > 14.1* 12.9* 14.6* 11.7* 8.9  NEUTROABS 9.9*  --   --   --   --   --   --   HGB 7.9*   < > 8.1* 8.2* 8.7* 8.0* 8.5*  HCT 26.0*   < > 26.4* 26.8* 27.8* 26.2* 27.4*  MCV 89.3   < > 91.0 89.6 86.3 87.0 88.4  PLT 145*   < > 139* 146* 196 176 189   < > = values in this interval not displayed.   Basic Metabolic Panel: Recent Labs  Lab 05/08/18 0356  05/10/18 0439 05/11/18 0812 05/12/18 0456 05/13/18 0538 05/14/18 0500  NA 132*   < > 131* 131* 129* 130* 131*  K 4.6   < > 4.6 3.6 3.8 3.6 3.8  CL 90*   < > 90* 87* 88* 92* 90*  CO2 34*   < > 31 30 28 30 30   GLUCOSE 146*   < > 124* 165* 146* 138* 135*  BUN 44*   < > 43* 32* 27* 25* 25*  CREATININE 1.19   < > 1.20 1.07 1.00 0.98 0.90  CALCIUM 8.8*   < > 8.5* 8.5* 8.5* 8.1* 8.6*  MG 2.2  --   --   --   --   --   --   PHOS 2.7  --   --   --   --   --   --    < > = values in this interval not displayed.   GFR: Estimated Creatinine Clearance: 70.9 mL/min (by C-G formula based on SCr of 0.9 mg/dL). Liver Function Tests: Recent  Labs  Lab 05/10/18 0439 05/11/18 0812 05/12/18 0456 05/13/18 0538 05/14/18 0500  AST 33 30 35 24 28  ALT 26 24 25 22 22   ALKPHOS 137* 145* 161* 135* 145*  BILITOT 0.9 0.7 0.8 0.7 0.5  PROT 5.2* 5.0* 5.2* 4.9* 5.1*  ALBUMIN 2.7* 2.6* 2.6* 2.5* 2.6*   No results for input(s): LIPASE, AMYLASE in the last 168 hours. No results for input(s): AMMONIA in the last 168 hours. Coagulation Profile: No results for input(s): INR, PROTIME in  the last 168 hours. Cardiac Enzymes: No results for input(s): CKTOTAL, CKMB, CKMBINDEX, TROPONINI in the last 168 hours. BNP (last 3 results) No results for input(s): PROBNP in the last 8760 hours. HbA1C: No results for input(s): HGBA1C in the last 72 hours. CBG: No results for input(s): GLUCAP in the last 168 hours. Lipid Profile: No results for input(s): CHOL, HDL, LDLCALC, TRIG, CHOLHDL, LDLDIRECT in the last 72 hours. Thyroid Function Tests: No results for input(s): TSH, T4TOTAL, FREET4, T3FREE, THYROIDAB in the last 72 hours. Anemia Panel: No results for input(s): VITAMINB12, FOLATE, FERRITIN, TIBC, IRON, RETICCTPCT in the last 72 hours. Urine analysis: No results found for: COLORURINE, APPEARANCEUR, LABSPEC, PHURINE, GLUCOSEU, HGBUR, BILIRUBINUR, KETONESUR, PROTEINUR, UROBILINOGEN, NITRITE, LEUKOCYTESUR Sepsis Labs: @LABRCNTIP (procalcitonin:4,lacticidven:4)  ) Recent Results (from the past 240 hour(s))  Aerobic Culture (superficial specimen)     Status: None (Preliminary result)   Collection Time: 05/13/18  8:30 AM  Result Value Ref Range Status   Specimen Description WOUND  Final   Special Requests HEMATOMA  Final   Gram Stain   Final    MODERATE WBC PRESENT, PREDOMINANTLY PMN MODERATE GRAM NEGATIVE RODS FEW GRAM POSITIVE RODS Performed at [facility], [address], [place], [place] [address]    Culture PENDING  Incomplete   Report Status PENDING  Incomplete      Radiology Studies: No results found.   Scheduled  Meds: . allopurinol  100 mg Oral Daily  . amiodarone  400 mg Oral BID  . bumetanide  3 mg Oral BID  . diclofenac sodium  2 g Topical QID  . digoxin  0.125 mg Oral Daily  . feeding supplement (ENSURE ENLIVE)  237 mL Oral BID BM  . HYDROcodone-acetaminophen  1 tablet Oral Q8H  . iron polysaccharides  150 mg Oral Daily  . LORazepam  0.5 mg Oral TID  . multivitamin with minerals  1 tablet Oral Daily  . polyethylene glycol  17 g Oral BID  . senna-docusate  1 tablet Oral BID  . sertraline  200 mg Oral Daily  . sodium chloride flush  10-40 mL Intracatheter Q12H  . spironolactone  50 mg Oral BID  . terbinafine  250 mg Oral Daily   Continuous Infusions:    LOS: 19 days   Time Spent in minutes   30 minutes  Keon Benscoter D.O. on 05/14/2018 at 10:11 AM  Between 7am to 7pm - Please see pager noted on [email or web address]  After 7pm go to [email or web address]  And look for the night coverage person covering for me after hours  [facility] Office  [phone]

## 2018-05-14 NOTE — Care Management Important Message (Signed)
Important Message  Patient Details  Name: Antonio Carrillo MRN: [id number] Date of Birth: 1940/12/23   Medicare Important Message Given:  Yes    [name] 05/14/2018, 4:44 PM

## 2018-05-14 NOTE — Progress Notes (Addendum)
Patient suffers from poor activity tolerance, hematoma of LLE, and other co-morbidities of CLL, HTN, R ventricular failure which impairs their ability to perform daily activities like ambulation and other ADL task in the home/community.  A walker alone will not resolve the issues with performing activities of daily living. A light-weight manual wheelchair will allow patient to safely perform daily activities.  The patient can self propel in the home or has a caregiver who can provide assistance.     Lanney Gins, PT, DPT Supplemental Physical Therapist 05/14/18 12:28 PM Pager: [phone] Office: [phone]

## 2018-05-14 NOTE — Care Management Note (Signed)
Case Management Note Antonio Gibbons RN, BSN Transitions of Care Unit 4E- RN Case Manager [phone]  Patient Details  Name: Antonio Carrillo MRN: [id number] Date of Birth: Sep 26, 1940  Subjective/Objective:    Pt admitted with vol. Overload, HF              Action/Plan: PTA pt lived at home with wife, referral received for transition of care needs, PC community referral. CM spoke with pt and spouse at bedside, discussed pt's goal to return home with GOL. Pt reports that he has needed DME at home for now, is interested in [facility] referral at time of discharge discussed what this type of referral means and what type of services are offered vs Hospice services in the home. Pt and wife state that they would like a referral to [name] for PC on discharge- CM will call and make this referral prior to discharge. Also discussed [name] services and private duty care- pt has used [name] in past , list provided Per CMS guidelines from medicare.gov website with star ratings (copy placed in shadow chart) for them to review for choice- once orders placed CM will f/u for choice and make any referrals needed. Offered to [employer] options for private duty- wife and pt both declined at this time stating that they could do this and f/u with researching resources for this. CM will continue to follow for transition of care needs.   Expected Discharge Date:                  Expected Discharge Plan:  [name]  In-House Referral:  Hospice / Palliative Care  Discharge planning Services  CM Consult  Post Acute Care Choice:  Durable Medical Equipment, Home Health, Hospice Choice offered to:  Patient, Spouse  DME Arranged:  3-N-1 DME Agency:  [name] Arranged:  RN, Disease Management, PT HH Agency:  [name]  Status of Service:  In process, will continue to follow  If discussed at Long Length of Stay Meetings, dates discussed:    Discharge Disposition: home/home  health   Additional Comments:  05/14/18- 1030- Antonio Alewine RN, CM-  Call made this AM to [facility] for PC referral for community home PC f/u per pt and family request- referral to be f/u with per [name] for community based PC services. Call made to [name] with Antonio Carrillo for [facility] services per pt/family choice- referral has been accepted for HHRN/PT- orders have been placed per MD. Antonio Carrillo again with pt and daughter at bedside- went over referrals that have been placed- and DME needs again- have placed a msg to attending MD about DME request- orders have been placed for 3n1 and w/c- spoke with PT who will have to f/u regarding w/c request and place narrative note if agreeable with request for w/c. CM will f/u for DME needs- and have delivered to room per [facility] prior to discharge. Pharmacy on alert for [facility] med needs.  Per pt his PCP- is at [name] Antonio Carrillo. Pt will primarily be followed by HF clinic on discharge.   05/13/18- 1500- Antonio Liptak RN, CM- f/u done with pt and daughter at bedside for transition of care needs- pt has been weaned off Milrinone drip today with plan for transition home if does well possibly 2/12/2- discussed HH and DME needs with pt and daughter, confirmed plans for referral to [facility] for PC f/u in the home.  CM will call in am to make PC referral to [name] per pt request. Discussed choice for [facility] agencies based on CMS list that was provided on previous visit - pt and daughter have provided CM with top three agencies to f/u with for [facility] needs- CM will call these agencies based on order of preference. Will need HH orders for RN/PT prior to discharge.  Pt would also like a 3n1-DME, has RW and transport chair at home. Asking about a w/c- however if pt got RW in last 5 years medicare will not cover w/c at this time- CM will check into this to see when RW was received. Benefits check was completed on Eliquis and per pt copay cost came out to what he has been  paying PTA. Discussed using [facility] pharmacy for filling meds prior to discharge- pt and daughter are interested in using [facility] for ease of transition home on discharge- MD would need to send scripts for discharge to [facility] to fill and deliver to bedside.  CM will f/u with pt and family tomorrow for further transition planning.   Antonio Patricia, RN 05/14/2018, 11:37 AM

## 2018-05-14 NOTE — Progress Notes (Signed)
Ortho Trauma Progress Note:  Pain improved. Cultures without growth as of this AM  LLE: Minimal purulence or fluid expressed this AM. Pain improved. No signs of area increasing  Cultures with GNRs on gram stain  A/P 78 yo male with LLE hematoma with apparent infection  WBAT Cultures sent from hematoma-will likely need PO antibiotics (will start on Keflex after discussion with Dr. Ree Kida Wound care to LLE-BID dressing changes with gauze, ABD pad and ACE wrap. Will eval again tomorrow prior to discharge  Shona Needles, MD [facility] [phone] (phone)

## 2018-05-14 NOTE — Progress Notes (Signed)
Physical Therapy Treatment Patient Details Name: Antonio Carrillo MRN: [id number] DOB: March 19, 1941 Today's Date: 05/14/2018    History of Present Illness Antonio Carrillo is a 78 y.o. male with medical history significant of CLL, hypertension, hyperlipidemia, paroxysmal A. 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 Was started on a Milrinone gtt and getting diuresed with Advanced Heart Team Following. During the hospitalization his Left Leg developed  new warmth and Erythema and ? Venous Stasis changes vs. Cellulitis but Cardiology started the patient on IV Vancomycin.    PT Comments    Patient seen for mobility progression. B LE wrapped in ACE bandage. Patient motivated to participate. Ambulating in hallway with RW - does require Min guard for safety and multiple standing rest breaks due to fatigue, with patient also reporting slight SOB and fatigue with progressive mobility, with SpO2 on RA >90%. PT speaking with patient and family. Will likely require w/c for community mobility at this time due to fatigue and reduced endurance. PT to continue to follow.      Follow Up Recommendations  Home health PT;Supervision/Assistance - 24 hour     Equipment Recommendations  Wheelchair (measurements PT);Wheelchair cushion (measurements PT)(for long distances)    Recommendations for Other Services       Precautions / Restrictions Precautions Precautions: Fall Restrictions Weight Bearing Restrictions: No RLE Weight Bearing: Weight bearing as tolerated LLE Weight Bearing: Weight bearing as tolerated    Mobility  Bed Mobility Overal bed mobility: Needs Assistance Bed Mobility: Supine to Sit;Sit to Supine     Supine to sit: Supervision Sit to supine: Supervision   General bed mobility  comments: supervision for safety; increased time to prevent pain  Transfers Overall transfer level: Needs assistance Equipment used: Rolling walker (2 wheeled) Transfers: Sit to/from Stand Sit to Stand: Min guard         General transfer comment: Min guard for immediate standing balance and safety;   Ambulation/Gait Ambulation/Gait assistance: Min guard Gait Distance (Feet): 200 Feet(2 reps) Assistive device: Rolling walker (2 wheeled) Gait Pattern/deviations: Step-through pattern;Decreased stride length;Wide base of support;Trunk flexed Gait velocity: reduced   General Gait Details: trunk flexion throughout with required use of UE at RW; slow pace with multiple standing rest breaks   Stairs             Wheelchair Mobility    Modified Rankin (Stroke Patients Only)       Balance Overall balance assessment: Needs assistance Sitting-balance support: Feet supported Sitting balance-Leahy Scale: Good     Standing balance support: Bilateral upper extremity supported;During functional activity Standing balance-Leahy Scale: Fair Standing balance comment: RW needed once standing is dynamic                            Cognition Arousal/Alertness: Awake/alert Behavior During Therapy: WFL for tasks assessed/performed Overall Cognitive Status: Within Functional Limits for tasks assessed                                        Exercises      General Comments General comments (skin integrity, edema, etc.): B LE wrapped in ACE bandage      Pertinent Vitals/Pain Pain  Assessment: No/denies pain    Home Living                      Prior Function            PT Goals (current goals can now be found in the care plan section) Acute Rehab PT Goals Patient Stated Goal: to walk and go home PT Goal Formulation: With patient Time For Goal Achievement: 05/18/18 Potential to Achieve Goals: Good Progress towards PT goals: Progressing  toward goals    Frequency    Min 3X/week      PT Plan Current plan remains appropriate    Co-evaluation              AM-PAC PT "6 Clicks" Mobility   Outcome Measure  Help needed turning from your back to your side while in a flat bed without using bedrails?: None Help needed moving from lying on your back to sitting on the side of a flat bed without using bedrails?: None Help needed moving to and from a bed to a chair (including a wheelchair)?: A Little Help needed standing up from a chair using your arms (e.g., wheelchair or bedside chair)?: A Little Help needed to walk in hospital room?: A Little Help needed climbing 3-5 steps with a railing? : A Lot 6 Click Score: 19    End of Session Equipment Utilized During Treatment: Gait belt Activity Tolerance: Patient limited by fatigue Patient left: in bed;with call bell/phone within reach;with family/visitor present Nurse Communication: Mobility status PT Visit Diagnosis: Unsteadiness on feet (R26.81);Muscle weakness (generalized) (M62.81)     Time: 0981-1914 PT Time Calculation (min) (ACUTE ONLY): 19 min  Charges:  $Gait Training: 8-22 mins                     Lanney Gins, PT, DPT Supplemental Physical Therapist 05/14/18 12:20 PM Pager: [phone] Office: [phone]

## 2018-05-15 LAB — BASIC METABOLIC PANEL
Anion gap: 11 (ref 5–15)
BUN: 26 mg/dL — ABNORMAL HIGH (ref 8–23)
CO2: 30 mmol/L (ref 22–32)
Calcium: 8.6 mg/dL — ABNORMAL LOW (ref 8.9–10.3)
Chloride: 90 mmol/L — ABNORMAL LOW (ref 98–111)
Creatinine, Ser: 0.92 mg/dL (ref 0.61–1.24)
GFR calc Af Amer: >60 mL/min (ref >60)
GFR calc non Af Amer: >60 mL/min (ref >60)
Glucose, Bld: 114 mg/dL — ABNORMAL HIGH (ref 70–99)
Potassium: 3.9 mmol/L (ref 3.5–5.1)
Sodium: 131 mmol/L — ABNORMAL LOW (ref 135–145)

## 2018-05-15 LAB — HEMOGLOBIN AND HEMATOCRIT, BLOOD
HCT: 29.2 % — ABNORMAL LOW (ref 39.0–52.0)
Hemoglobin: 9 g/dL — ABNORMAL LOW (ref 13.0–17.0)

## 2018-05-15 LAB — COOXEMETRY PANEL
Carboxyhemoglobin: 1.7 % — ABNORMAL HIGH (ref 0.5–1.5)
Methemoglobin: 1.1 % (ref 0.0–1.5)
O2 SAT: 58.6 %
Total hemoglobin: 9.1 g/dL — ABNORMAL LOW (ref 12.0–16.0)

## 2018-05-15 LAB — AEROBIC CULTURE W GRAM STAIN (SUPERFICIAL SPECIMEN): Culture: NO GROWTH

## 2018-05-15 LAB — AEROBIC CULTURE  (SUPERFICIAL SPECIMEN)

## 2018-05-15 MED ORDER — SPIRONOLACTONE 25 MG PO TABS: 25.0000 mg | ORAL_TABLET | Freq: Every day | ORAL | 6 refills | Status: DC

## 2018-05-15 MED ORDER — SENNOSIDES-DOCUSATE SODIUM 8.6-50 MG PO TABS: 1.0000 | ORAL_TABLET | Freq: Two times a day (BID) | ORAL | 0 refills | Status: AC

## 2018-05-15 MED ORDER — AMIODARONE HCL 400 MG PO TABS: 400.0000 mg | ORAL_TABLET | Freq: Two times a day (BID) | ORAL | 0 refills | Status: DC

## 2018-05-15 MED ORDER — HYDROCODONE-ACETAMINOPHEN 5-325 MG PO TABS: 1.0000 | ORAL_TABLET | Freq: Three times a day (TID) | ORAL | 0 refills | Status: AC | PRN

## 2018-05-15 MED ORDER — MAGIC MOUTHWASH W/LIDOCAINE: 5.0000 mL | Freq: Four times a day (QID) | ORAL | 0 refills | Status: AC | PRN

## 2018-05-15 MED ORDER — LACTULOSE 10 GM/15ML PO SOLN: 20.0000 g | Freq: Every day | ORAL | 0 refills | Status: AC | PRN

## 2018-05-15 MED ORDER — BUMETANIDE 1 MG PO TABS: 3.0000 mg | ORAL_TABLET | Freq: Two times a day (BID) | ORAL | 0 refills | Status: AC

## 2018-05-15 MED ORDER — APIXABAN 5 MG PO TABS: 5.0000 mg | ORAL_TABLET | Freq: Two times a day (BID) | ORAL | Status: DC

## 2018-05-15 MED ORDER — POLYETHYLENE GLYCOL 3350 17 G PO PACK: 17.0000 g | PACK | Freq: Every day | ORAL | 0 refills | Status: AC | PRN

## 2018-05-15 MED ORDER — CEPHALEXIN 500 MG PO CAPS: 500.0000 mg | ORAL_CAPSULE | Freq: Two times a day (BID) | ORAL | 0 refills | Status: AC

## 2018-05-15 MED ORDER — DIGOXIN 125 MCG PO TABS: 0.1250 mg | ORAL_TABLET | Freq: Every day | ORAL | 0 refills | Status: DC

## 2018-05-15 MED ORDER — DICLOFENAC SODIUM 1 % TD GEL: 2.0000 g | Freq: Four times a day (QID) | TRANSDERMAL | 0 refills | Status: AC

## 2018-05-15 MED FILL — DICLOFENAC SODIUM 1% GEL: 1 | 12 days supply | Qty: 100 | Fill #0

## 2018-05-15 MED FILL — LACTULOSE 10 GM/15 ML SOLN: 10 | 8 days supply | Qty: 240 | Fill #0

## 2018-05-15 MED FILL — BUMETANIDE 1 MG TABS: 1 | 30 days supply | Qty: 180 | Fill #0

## 2018-05-15 MED FILL — HYDROCODON-APAP 5-325: 5-325 | 4 days supply | Qty: 12 | Fill #0

## 2018-05-15 MED FILL — POLYETHYLENE GLYCOL 3350 PO: 17 | 14 days supply | Qty: 238 | Fill #0

## 2018-05-15 MED FILL — AMIODARONE HCL 200 MG TAB: 200 | 30 days supply | Qty: 120 | Fill #0

## 2018-05-15 MED FILL — CEPHALEXIN 500 MG CAPSULE: 500 | 7 days supply | Qty: 14 | Fill #0

## 2018-05-15 MED FILL — DIGOXIN 0.125 MG TABLET: 125 | 30 days supply | Qty: 30 | Fill #0

## 2018-05-15 MED FILL — SENNA S 8.6-50 MG TABS: 8.6-50 | 30 days supply | Qty: 60 | Fill #0

## 2018-05-15 NOTE — Discharge Instructions (Signed)

## 2018-05-15 NOTE — Progress Notes (Signed)
Telemetry discontinued. CCMD notified. Discharge instructions reviewed with patient and patient's wife. All questions answered.   Emelda Fear, RN

## 2018-05-15 NOTE — Progress Notes (Addendum)
Patient ID: Antonio Carrillo, male   DOB: 1941/01/27, 78 y.o.   MRN: [id number]     Advanced Heart Failure Rounding Note  PCP-Cardiologist: No primary care provider on file.   Subjective:    Admitted from clinic 04/25/2018 with marked volume overloaded and abdominal distention.   Had LLE hematoma 05/08/18. Ortho aspirated 15 cc of serosanguinous fluid. Ortho saw him again 2/10 and aspirated >150 mls from hematoma with some purulence. Hemoglobin 9.0. Eliquis on hold. On Keflex. Culture NGTD.  Milrinone turned off 2/10. Coox 59%.  Remains on bumex 3 mg BID. Weight down 3 more lbs. I/O negative 1.2 L. Creatinine stable.   Feels great. Still has some pain in LLE with standing, but is improving. No CP or SOB. Hopes to go home today.   Echo 04/25/2018: LV EF 55-60%, D-shaped interventricular septum with RV enlargement and dysfunction, severe TR.   Objective:   Weight Range: 84.1 kg Body mass index is 25.84 kg/m.   Vital Signs:   Temp:  [97.6 F (36.4 C)-98.4 F (36.9 C)] 97.8 F (36.6 C) (02/12 0739) Pulse Rate:  [64-68] 67 (02/12 0739) Resp:  [13-28] 16 (02/12 0739) BP: (109-125)/(64-76) 118/67 (02/12 0739) SpO2:  [94 %-100 %] 100 % (02/12 0739) Weight:  [84.1 kg] 84.1 kg (02/12 0427) Last BM Date: 05/14/18  Weight change: Filed Weights   05/13/18 0407 05/14/18 0356 05/15/18 0427  Weight: 86.4 kg 85.3 kg 84.1 kg   Intake/Output:   Intake/Output Summary (Last 24 hours) at 05/15/2018 0802 Last data filed at 05/15/2018 0600 Gross per 24 hour  Intake 1337 ml  Output 2510 ml  Net -1173 ml    Physical Exam   General: Elderly. No resp difficulty. HEENT: Normal Neck: Supple. JVP ~7. Carotids 2+ bilat; no bruits. No thyromegaly or nodule noted. Cor: PMI nondisplaced. RRR, No M/G/R noted Lungs: CTAB, normal effort. Abdomen: Soft, non-tender, non-distended, no HSM. No bruits or masses. +BS  Extremities: No cyanosis, clubbing, or rash. R and LLE wrapped in ACE. RUE PICC Neuro:  Alert & orientedx3, cranial nerves grossly intact. moves all 4 extremities w/o difficulty. Affect pleasant  Telemetry   NSR 60s with IVCD, personally reviewed.  Labs    CBC Recent Labs    05/13/18 0538 05/14/18 0500 05/15/18 0432  WBC 11.7* 8.9  --   HGB 8.0* 8.5* 9.0*  HCT 26.2* 27.4* 29.2*  MCV 87.0 88.4  --   PLT 176 189  --    Basic Metabolic Panel Recent Labs    05/14/18 0500 05/15/18 0432  NA 131* 131*  K 3.8 3.9  CL 90* 90*  CO2 30 30  GLUCOSE 135* 114*  BUN 25* 26*  CREATININE 0.90 0.92  CALCIUM 8.6* 8.6*   Liver Function Tests Recent Labs    05/13/18 0538 05/14/18 0500  AST 24 28  ALT 22 22  ALKPHOS 135* 145*  BILITOT 0.7 0.5  PROT 4.9* 5.1*  ALBUMIN 2.5* 2.6*   No results for input(s): LIPASE, AMYLASE in the last 72 hours. Cardiac Enzymes No results for input(s): CKTOTAL, CKMB, CKMBINDEX, TROPONINI in the last 72 hours.  BNP: BNP (last 3 results) Recent Labs    04/25/18 0949  BNP 1,544.3*    ProBNP (last 3 results) No results for input(s): PROBNP in the last 8760 hours.   D-Dimer No results for input(s): DDIMER in the last 72 hours. Hemoglobin A1C No results for input(s): HGBA1C in the last 72 hours. Fasting Lipid Panel No results for input(s):  CHOL, HDL, LDLCALC, TRIG, CHOLHDL, LDLDIRECT in the last 72 hours. Thyroid Function Tests No results for input(s): TSH, T4TOTAL, T3FREE, THYROIDAB in the last 72 hours.  Invalid input(s): FREET3  Other results:   Imaging    No results found.   Medications:     Scheduled Medications: . allopurinol  100 mg Oral Daily  . amiodarone  400 mg Oral BID  . bumetanide  3 mg Oral BID  . cephALEXin  500 mg Oral Q12H  . diclofenac sodium  2 g Topical QID  . digoxin  0.125 mg Oral Daily  . feeding supplement (ENSURE ENLIVE)  237 mL Oral BID BM  . HYDROcodone-acetaminophen  1 tablet Oral Q8H  . iron polysaccharides  150 mg Oral Daily  . LORazepam  0.5 mg Oral TID  . multivitamin with  minerals  1 tablet Oral Daily  . polyethylene glycol  17 g Oral BID  . senna-docusate  1 tablet Oral BID  . sertraline  200 mg Oral Daily  . sodium chloride flush  10-40 mL Intracatheter Q12H  . spironolactone  50 mg Oral BID  . terbinafine  250 mg Oral Daily    Infusions:   PRN Medications: benzocaine, bisacodyl, HYDROcodone-acetaminophen, lidocaine, magic mouthwash w/lidocaine, morphine injection, ondansetron **OR** ondansetron (ZOFRAN) IV, prochlorperazine, sodium chloride flush, white petrolatum  Patient Profile   Antonio Carrillo is a 77 y.o. male with h/o AF s/p several ablations, aortic root aneurysm, s/p aortic root replacement with CABG 1, RV failure, CLL, and ascites.   Sent for admission from HF clinic 04/25/2018 with marked volume overload on exam, RLE wound, and marked ascites.   Assessment/Plan   1. 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 [facility]MRI 4/18 [facility] with normal LV function. RV severely dilated and mild RV dysfunction.  Echo this admission with LV EF 55-60%, RV dilated and dysfunctional, severe TR, D-shaped septum. - Volume stable. CVP not hooked up. Weight continues to trend down.  - Coox 59%. Now off milrinone. Stopped 2/10 - Continue digoxin 0.125 mg daily - Continue bumex 3 mg BID.   - Continue spironolactone 50 mg BID.  - Long-term prognosis is quite poor.  With RV failure, not candidate for LVAD and also would be poor HD candidate. Palliative Care following. He has been resistant to [place] discussions. He is hopeful that he may get some recovery. He was very satisfied with his QOL recently and would like to get back to that.  2. PAF: S/p multiple ablations and MAZE. Atrial fibrillation on 2/7, back to NSR on amiodarone. Remains in NSR. - Continue amiodarone 400 mg bid.  - Eliquis restarted after holding for leg hematoma. Had hematoma aspirated 2/10 with 150 mls out and eliquis held again.  -  Eliquis on hold. Will discuss timing of restart with MD. Hgb 9.0 today.  3. CLL: Per Oncology.  Now s/p pelvic XRT. No change.  4. CAD and CABG x 1: No chest pain.  - No ASA given apixaban use.     5. Aortic root aneurysm s/p repair. No change.  6. LLE hematoma: Ortho following. Aspirated 15 cc serosanguinous fluid.  Eliquis restarted with stable hemoglobin. No role for surgery. Continue compressive wrap.   - Ortho saw again 2/10 and aspirated 150 mls from hematoma. Culture NGTD. On Keflex. Eliquis on hold.  7. Iron deficiency Anemia: No overt bleeding. Hemoglobin 9.0 today. Eliquis on hold with hematoma.  -  Received feraheme 11/25.  8. Ascites: Persists.  S/p 2 paracenteses recently with 4.1 and 1.3 L out. 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 Cytology from paracentesis has repeatedly been negative for malignancy - Consider repeat paracentesis as needed. No change.  9. Hyponatremia: Sodium 131 10. Hypokalemia: K 3.9 today.  11. Hypotension: Stable. SBPs 110-120s - Consider midodrine as needed 12. LLE cellulitis: Improving. Finished Vancomycin.  No change - Would cx from hematoma final result pending. Ortho following.   13. ? Glossitis: Chronic, intermittent occurrence since his cancer treatment - Continue magic mouthwash. No change.  14. Constipation: Enema per primary service. Resolved.   He looks great. I think he should be able to go home with [facility] RN/PT today. Dr Haroldine Laws to sign off on final recommendations.   Heart failure team will sign off as of 05/15/18  HF Team Medication Recommendations for Home: Amio 400 mg BID Eliquis 5 mg BID  Bumex 3 mg BID Digoxin 0.125 mg daily Spironolactone 25 mg daily  Other recommendations (Labs,testing, etc): BMET in 1 week though HH.   Follow up as an outpatient: 05/28/18 10:40 am with Dr Haroldine Laws.    Georgiana Shore, NP  05/15/2018 8:02 AM   Advanced Heart Failure Team Pager [phone] (M-F; [place])  Please contact  [facility] for night-coverage after hours (4p -7a ) and weekends on [email or web address]  Patient seen and examined with the above-signed Advanced Practice Provider and/or Housestaff. I personally reviewed laboratory data, imaging studies and relevant notes. I independently examined the patient and formulated the important aspects of the plan. I have edited the note to reflect any of my changes or salient points. I have personally discussed the plan with the patient and/or family.  Looks much better. Co-ox stable off milrinone. Weight down 40 pounds. Ok to d/c home today. Restart Eliquis tonight or tomorrow. I will see next week in HF Clinic.   Glori Bickers, MD  12:56 PM

## 2018-05-15 NOTE — Progress Notes (Addendum)
Nutrition Consult/Education Note   RD consulted to speak with pt and family. Family with many questions regarding heart healthy diet.  Lipid Panel     Component Value Date/Time   CHOL 141 11/04/2010 0820   TRIG 116 11/04/2010 0820   HDL 38 (L) 11/04/2010 0820   CHOLHDL 3.7 11/04/2010 0820   VLDL 23 11/04/2010 0820   LDLCALC 80 11/04/2010 0820   Provided examples on ways to decrease sodium intake in diet. Discouraged intake of processed foods and use of salt shaker.   Recommended cooking methods and techniques such as baking, broiling and grilling. Encouraged fresh fruits and vegetables as well as whole grain sources of carbohydrates to maximize fiber intake.   RD discussed the role of fluids. Also emphasized importance of protein intake to support wound healing.  Expect good compliance.  Arthur Holms, RD, LDN Pager #: [phone] After-Hours Pager #: [phone]

## 2018-05-15 NOTE — Discharge Summary (Signed)
Physician Discharge Summary  Antonio Carrillo:[id number] DOB: 24-Nov-1940 DOA: 04/25/2018  PCP: Patient, No Pcp Per  Admit date: 04/25/2018 Discharge date: 05/15/2018  Admitted From: Home Disposition: Home health  Recommendations for Outpatient Follow-up:  1. Follow up with PCP in 1-2 weeks 2. Please obtain BMP/CBC in one week 3. Please follow up on the following pending results:  Home Health:yes  Equipment/Devices: Wound care/PT/OT  Discharge Condition: Stable CODE STATUS: dnr Diet recommendation:  Heart healthy diet, 1200 ml fluid restriction  Brief/Interim Summary:  HPI On 04/25/2018 by Dr. Lurline Idol a 77 y.o.malewith medical history significant ofCLL, hypertension, hyperlipidemia, paroxysmal A. 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 Patient has extensive medical problems. He has history of atrial fibrillation with multiple ablations, aortic root replacement with CABG, right ventricular heart failure and CLL. Patient has been going through multiple diuretics to help with his leg swellings. According to the patient, it was manageable until about a month now. Patient stated that his legs are hugeand edematous, they were skinny before Christmas. Patient gained about 40 pounds of weight since 1 month. He had some ascites in the past, however since last 1 month he had to have it tapped 3 times. Patient also has persistent nausea but no vomiting. Patient also has pain due to tightness of the legs. He also injured his right leg at home and had to have sutures placed which are draining liquid around the suture lines. Patient denies any chest pain or palpitation. He is short of breath on exertion but none at rest. He denies orthopnea or paroxysmal nocturnal dyspnea. Patient recently on  Bumex. He went to see cardiologist today in the office, due to significant symptoms while sent to inpatient admission. Patient examined at the bedside. He was on room air. His blood pressures are stable. WBC count is elevated, however chronic. His potassium was 5.5, he is on Aldactone and potassium supplement. His hemoglobin is more or less at baseline. Creatinine is 2.69 which is significantly above his baseline of 1.4.  Patient was admitted for CHF exacerbation, seen by cardiology/HF team  And Orthopedics. Patient developed spontaneous hematoma in LLE, eliquis was held briefly. Patient diuretics changed to po bumex 3 mg bid, having good UOP. He is resting well, wife and family at bedside and want to go home today w HH.No new complaints. Seen by CHF team this and okay for home today and they will f/u w labs in 1 week.  Discharge Diagnoses:  Principal Problem:   Acute on chronic combined systolic and diastolic CHF (congestive heart failure) (HCC) Active Problems:   ATRIAL FIBRILLATION   AKI (acute kidney injury) (HCC)   Hyperkalemia   Ascites   CLL (chronic lymphocytic leukemia) (HCC)   Anemia   CHF (congestive heart failure) (HCC)   Palliative care encounter   Right foot injury   Chronic bilateral low back pain without sciatica   Palliative care by specialist   Anxiety state   Leg hematoma, left, initial encounter  Acute on chronic combined systolic and diastolic heart failure : pt had  hyponatremia, ascites and pedal edema. Resolved now. Euvolemic. Tolerating bumex. -Cardiology and palliative care had seen -Patient was on IV Lasix and transitioned to Bumex 3mg  BID -Was placed on a milrinone drip - which has been discontinued -Monitordaily weights as o/p -Echocardiogram 04/25/2018 showed normal  LV systolic function, mild LVE, mild AI, mild  MR, severe biatrial enlargement, mild RVE with moderate RV dysfunction, severe TR  "HF Team Medication Recommendations for Home: Amio 400  mg BID Eliquis 5 mg BID  Bumex 3 mg BID Digoxin 0.125 mg daily Spironolactone 25 mg daily  Other recommendations (Labs,testing, etc): BMET in 1 week though HH.  Follow up as an outpatient: 05/28/18 10:40 am with Dr Haroldine Laws."   Paroxysmal atrial fibrillation:Patient has had multiple ablations and MAZE in the past - controlled now and to continue amiodarone, digoxin. Eliquis was held due to hematoma in leg and to be resumed today on d/c as per cardiology.  LLE hematoma,spontaneous: LE doppler negative for DVT.CT LLE: No CT evidence of left lower extremity DVT.  Probable subcutaneous hematoma anterior laterally in the proximal left lower leg.  Generalized subcutaneous edema in the distal left lower leg, possibly cellulitis. -patient was briefly placed on vancomycin again on 2/5, however this is less likely infectious, will discontinue vancomycin (recent AKI) and monitor- patient also afebrile with improvement of leukocytosis -Orthopedics consulted and appreciated. Dr. Doreatha Martin sspirated15cc serosanguinous fluid on 2/5.  >158ml was expressed on 05/13/2018.  -GS w mod GNR, few GPR- wound culture no growth to date and in process. Seen by Dr. Doreatha Martin- to continue ABD pad and ACE wrap and Local wound care and patient is to be visited by the wound nurse and continue PO keflex 500mg  BID for 7 days.   Acute kidney injury w creat of 2.5-resolved, at 0.9, tolerating diuretics. monitor BMP as o/p.  Hypokalemia:Likely secondary to diuresis.Resolved. Continue to monitor BMP as outpatient as per cardio.  Right foot wound/dorsal laceration with leukocytosis -was seen by orthopedics at [facility] on 04/17/2018 and given Keflex -On 04/26/2018-  Patient noted to have swelling with weeping profusely and pedal edema -Orthopedic surgery consulted and appreciated, Dr. Doreatha Martin remove sutures and recommended antibiotics with doxycycline which is now been discontinued -Wound care consulted -Continue  dressings -Patient with continued erythema and swelling-cardiology started vancomycin on 05/02/2018- however discontinued on 05/07/2018  Anxiety/depression:-Continue Zoloft, Ativan- being managed by o/p psych.  Chronic anemia/iron deficiency anemia: hemoglobin currently ranging from 7-8 -Continue to monitor CBC as outpatient.  OEV:[id number] in 2015 and followed at [facility] note from 04/01/2018 recommends observation only after his treatment and repeat CT in 3 months  Painful mouth and throat : requesting magic mouth wash on d/c as it is helping  -Has been a chronic issue for several years and patient has been undergoing treatment for CLL -Consider outpatient ENT or GI evaluations -No evidence of thrush  OSA: Continue CPAP nightly  Gout:Continue allopurinol  Left knee pain with arthritis:Continue Voltaren gel-prescription provided.  Elevated WEX:HBZJIR sick euthyroid, free T4 normal  Constipation:Resolved - was given dose of oral lactulose on 05/11/2018 Requesting lactulose on d/c just to keep in case neeed  Abnormal LFTs:Resolved  Hyponatremia:low and stable, continue to monitor w f/u bmp  Thrombocytopenia: resolved  Hyperglycemia:Hemoglobin A1c 6.1.  Goals of care and chronic pain -Patient currently DNR -Palliative care consulted and appreciate -pain is getting controlled, however patient with constipation. Continue scheduled norco to TID; continue norco and morphine PRN -pain seems to be better controlled  TTE 04/25/18 "Impressions:  - Normal LV systolic function; mild LVE; mild AI; mild MR; severe   biatrial enlargement; mild RVE with moderate RV dysfunction;   severe TR; cannot estimate pulmonary pressures due to severity of   TR but D shaped septum suggests  pulmonary hypertension. " Discharge Instructions  Discharge Instructions    Call MD for:  difficulty breathing, headache or visual disturbances   Complete by:  As directed     Call MD for:  redness, tenderness, or signs of infection (pain, swelling, redness, odor or green/yellow discharge around incision site)   Complete by:  As directed    Call MD for:  severe uncontrolled pain   Complete by:  As directed    Call MD for:  temperature >100.4   Complete by:  As directed    Diet - low sodium heart healthy   Complete by:  As directed    Discharge wound care:   Complete by:  As directed    WBAT Wound care to LLE-Daily 4x4 with gauze, ABD pad and ACE wrap. Rt leg wet to dry dressing twice a day.   Heart Failure patients record your daily weight using the same scale at the same time of day   Complete by:  As directed    Increase activity slowly   Complete by:  As directed    STOP any activity that causes chest pain, shortness of breath, dizziness, sweating, or exessive weakness   Complete by:  As directed      Allergies as of 05/15/2018   No Known Allergies     Medication List    TAKE these medications   allopurinol 300 MG tablet Commonly known as:  ZYLOPRIM Take 300 mg by mouth daily.   amiodarone 400 MG tablet Commonly known as:  PACERONE Take 1 tablet (400 mg total) by mouth 2 (two) times daily. What changed:    medication strength  how much to take   apixaban 5 MG Tabs tablet Commonly known as:  ELIQUIS TAKE ONE TABLET BY MOUTH 2 TIMES A DAY What changed:    how much to take  how to take this  when to take this  additional instructions   BIOTIN PO Take 1 tablet by mouth daily.   bumetanide 1 MG tablet Commonly known as:  BUMEX Take 3 tablets (3 mg total) by mouth 2 (two) times daily for 30 days. What changed:  medication strength   cephALEXin 500 MG capsule Commonly known as:  KEFLEX Take 1 capsule (500 mg total) by mouth every 12 (twelve) hours for 7 days.   diclofenac sodium 1 % Gel Commonly known as:  VOLTAREN Apply 2 g topically 4 (four) times daily.   digoxin 0.125 MG tablet Commonly known as:  LANOXIN Take 1  tablet (0.125 mg total) by mouth daily. Start taking on:  May 16, 2018   HYDROcodone-acetaminophen 5-325 MG tablet Commonly known as:  NORCO/VICODIN Take 1 tablet by mouth 3 (three) times daily as needed for up to 12 doses for moderate pain. What changed:  when to take this   iron polysaccharides 150 MG capsule Commonly known as:  NIFEREX Take 1 capsule (150 mg total) by mouth daily.   lactobacillus acidophilus Tabs tablet Take 2 tablets by mouth daily.   lactulose 10 GM/15ML solution Commonly known as:  CHRONULAC Take 30 mLs (20 g total) by mouth daily as needed for up to 8 doses for severe constipation.   LORazepam 0.5 MG tablet Commonly known as:  ATIVAN Take 0.5 mg by mouth every 8 (eight) hours as needed for anxiety.   magic mouthwash w/lidocaine Soln Take 5 mLs by mouth 4 (four) times daily as needed for up to 30 days for mouth pain.   ondansetron 8 MG  tablet Commonly known as:  ZOFRAN Take 8 mg by mouth every 8 (eight) hours as needed for nausea or vomiting.   polyethylene glycol packet Commonly known as:  MIRALAX / GLYCOLAX Take 17 g by mouth daily as needed for moderate constipation.   potassium chloride SA 20 MEQ tablet Commonly known as:  K-DUR,KLOR-CON Take 2 tablets (40 mEq) by mouth daily What changed:  See the new instructions.   prochlorperazine 10 MG tablet Commonly known as:  COMPAZINE Take 10 mg by mouth every 6 (six) hours as needed for nausea or vomiting.   senna-docusate 8.6-50 MG tablet Commonly known as:  Senokot-S Take 1 tablet by mouth 2 (two) times daily.   sertraline 100 MG tablet Commonly known as:  ZOLOFT Take 200 mg by mouth daily.   spironolactone 25 MG tablet Commonly known as:  ALDACTONE Take 1 tablet (25 mg total) by mouth daily. What changed:  how much to take   terbinafine 250 MG tablet Commonly known as:  LAMISIL Take 250 mg by mouth daily.            Durable Medical Equipment  (From admission, onward)          Start     Ordered   05/15/18 1119  Heart failure home health orders  (Heart failure home health orders / Face to face)  Once    Comments:  Heart Failure Follow-up Care:  Verify follow-up appointments per Patient Discharge Instructions. Confirm transportation arranged. Reconcile home medications with discharge medication list. Remove discontinued medications from use. Assist patient/caregiver to manage medications using pill box. Reinforce low sodium food selection Assessments: Vital signs and oxygen saturation at each visit. Assess home environment for safety concerns, caregiver support and availability of low-sodium foods. Consult Education officer, museum, PT/OT, Dietitian, and CNA based on assessments. Perform comprehensive cardiopulmonary assessment. Notify MD for any change in condition or weight gain of 3 pounds in one day or 5 pounds in one week with symptoms. Daily Weights and Symptom Monitoring: Ensure patient has access to scales. Teach patient/caregiver to weigh daily before breakfast and after voiding using same scale and record.    Teach patient/caregiver to track weight and symptoms and when to notify Provider. Activity: Develop individualized activity plan with patient/caregiver.  HHRN 2 wk 2 for CP assessment   PT 2 wk 2 for CP Rehab.   Please draw BMET and digoxin level in 1 week and fax to HF clinic.  Question Answer Comment  Heart Failure Follow-up Care [facility] at [phone]   Obtain the following labs Other see comments   Lab frequency Other see comments   Fax lab results to AHF Clinic at [phone]   Diet Low Sodium Heart Healthy   Fluid restrictions: 2000 mL Fluid      05/15/18 1118   05/14/18 1046  For home use only DME 3 n 1  Once     05/14/18 1046   05/14/18 1046  For home use only DME lightweight manual wheelchair with seat cushion  Once    Comments:  Patient suffers from left lower extremity draining wound/hematoma which  impairs their ability to perform daily activities in the home.  A walker will not resolve issue with performing activities of daily living. A wheelchair will allow patient to safely perform daily activities. Patient is not able to propel themselves in the home using a standard weight wheelchair due to generalized weakness compounded by lower extremity wound. Patient can self propel in the  lightweight wheelchair.  Accessories: elevating leg rests (ELRs), wheel locks, extensions and anti-tippers.   05/14/18 1046           Discharge Care Instructions  (From admission, onward)         Start     Ordered   05/15/18 0000  Discharge wound care:    Comments:  WBAT Wound care to LLE-Daily 4x4 with gauze, ABD pad and ACE wrap. Rt leg wet to dry dressing twice a day.   05/15/18 1457         Follow-up Information    Bensimhon, Shaune Pascal, MD Follow up on 05/21/2018.   Specialty:  Cardiology Why:  9 am for post hospital follow up. The code for parking is 0227. Contact information: [address] [place] [address] [place], [facility] Follow up.   Specialty:  [place] Why:  HHRN/PT arranged- they will call you to set up home visits Contact information: [address] [place] [address] [phone]        [facility] [place] Follow up.   Why:  Palliative care referral made- they will f/u for PC services in the home Contact information: [place] Follow up.   Why:  3n1 arranged- to be delivered to room prior to discharge Contact information: [address] [place] [address] [phone]        Shona Needles, MD. Call in 1 week(s).   Specialty:  Orthopedic Surgery Contact information: [place] [address] [phone]          No Known  Allergies  Consultations:  Cardiology  Orthopedics   Procedures/Studies: Dg Abd 1 View  Result Date: 05/11/2018 CLINICAL DATA:  Constipation and nausea EXAM: ABDOMEN - 1 VIEW COMPARISON:  None. FINDINGS: There is diffuse stool throughout the colon. There is no appreciable bowel dilatation or air-fluid level to suggest bowel obstruction. No free air. The visualized lung bases are clear. There is degenerative change in the lumbar spine. IMPRESSION: Diffuse stool throughout colon, a finding indicative of constipation. No bowel obstruction or free air evident. Electronically Signed   By: Lowella Grip III M.D.   On: 05/11/2018 13:21   Dg Chest Port 1 View  Result Date: 05/05/2018 CLINICAL DATA:  Bibasilar crackles EXAM: PORTABLE CHEST 1 VIEW COMPARISON:  04/25/2018 FINDINGS: Stable right PICC. Stable atrial appendage stable. Moderate cardiomegaly. Normal vascularity. Subsegmental atelectasis at the lung bases. No pneumothorax or pleural effusion. IMPRESSION: Cardiomegaly without decompensation. Electronically Signed   By: Marybelle Killings M.D.   On: 05/05/2018 07:57   Dg Chest Port 1 View  Result Date: 04/25/2018 CLINICAL DATA:  PICC line placement EXAM: PORTABLE CHEST 1 VIEW COMPARISON:  07/22/2015 FINDINGS: Interval placement of RIGHT PICC line tip in distal SVC. Sternotomy wires and atrial clip noted. No pulmonary edema or pneumothorax. IMPRESSION: PICC line in good position. Electronically Signed   By: Suzy Bouchard M.D.   On: 04/25/2018 19:00   Ct Extremity Lower Left W Wo Contrast  Result Date: 05/08/2018 CLINICAL DATA:  Swelling and erythema in the calf with lower leg pain. Concern for DVT. EXAM: CT OF THE LOWER LEFT EXTREMITY WITH CONTRAST TECHNIQUE: Multidetector CT imaging of the lower left extremity was performed before and during bolus administration of intravenous contrast. The images  extend from the lower lumbar spine into the left foot. COMPARISON:  Limited correlation made with  chest CT 06/18/2014. CONTRAST:  11mL OMNIPAQUE IOHEXOL 300 MG/ML  SOLN FINDINGS: Bones/Joint/Cartilage The large field of view utilized for this examination limits detail. Patient is status post left total knee arthroplasty. No evidence of hardware loosening. Ossific densities are noted posteromedial to the proximal fibula which do not appear acute. No definite acute fracture, dislocation or bone destruction. There are mild degenerative changes at the left hip. No significant arthropathy at the left ankle. Lower lumbar spondylosis noted with age-indeterminate inferior endplate deformity at L4. Ligaments Suboptimally assessed by CT. Muscles and Tendons There is well-circumscribed ossification within the mid hamstring musculature, most consistent with an old muscular injury. Similar ossification is noted within the proximal calf musculature, likely of the same etiology. The extensor mechanism is intact. The Achilles tendon appears intact. No intramuscular fluid collections are identified. Soft tissues Lenticular shaped, heterogeneous subcutaneous fluid collection is present anterolaterally in the proximal left lower leg. This measures 6.9 x 3.1 cm transverse on image 261/4 and extends approximately 16 cm in length. Appearance is most consistent with a subacute hematoma. No associated foreign body or soft tissue emphysema. There is generalized subcutaneous edema in the lower leg without other focal fluid collection. No focal filling defects are seen within the deep veins to suggest DVT. Vascular assessment at the knee is limited by artifact from the arthroplasty. Scattered arterial atherosclerosis noted. IMPRESSION: 1. No CT evidence of left lower extremity DVT. If that remains a clinical concern recommend evaluation with Doppler ultrasound. 2. Probable subcutaneous hematoma anterolaterally in the proximal left lower leg. 3. Generalized subcutaneous edema in the distal left lower leg, possibly cellulitis. 4. No acute  osseous findings in the left lower extremity. Age-indeterminate L4 compression deformity. Electronically Signed   By: Richardean Sale M.D.   On: 05/08/2018 17:56   Vas [place] Lower Extremity Venous (dvt)  Result Date: 05/08/2018  Lower Venous Study Indications: Swelling, and cellulitis.  Performing Technologist: Maudry Mayhew MHA, RDMS, RVT, RDCS  Examination Guidelines: A complete evaluation includes B-mode imaging, spectral Doppler, color Doppler, and power Doppler as needed of all accessible portions of each vessel. Bilateral testing is considered an integral part of a complete examination. Limited examinations for reoccurring indications may be performed as noted.  Right Venous Findings: +---+---------------+---------+-----------+----------+--------------+    CompressibilityPhasicitySpontaneityPropertiesSummary        +---+---------------+---------+-----------+----------+--------------+ CFVFull                    Yes                  Pulsatile flow +---+---------------+---------+-----------+----------+--------------+  Left Venous Findings: +---------+---------------+---------+-----------+----------+--------------+          CompressibilityPhasicitySpontaneityPropertiesSummary        +---------+---------------+---------+-----------+----------+--------------+ CFV      Full           No       Yes                  Pulsatile flow +---------+---------------+---------+-----------+----------+--------------+ SFJ      Full                                                        +---------+---------------+---------+-----------+----------+--------------+ FV Prox  Full                                                        +---------+---------------+---------+-----------+----------+--------------+  FV Mid   Full                                                        +---------+---------------+---------+-----------+----------+--------------+ FV DistalFull                                                         +---------+---------------+---------+-----------+----------+--------------+ PFV      Full                                                        +---------+---------------+---------+-----------+----------+--------------+ POP      Full           No       Yes                  Pulsatile flow +---------+---------------+---------+-----------+----------+--------------+ PTV      Full                                                        +---------+---------------+---------+-----------+----------+--------------+ PERO                                                  Not visualized +---------+---------------+---------+-----------+----------+--------------+    Summary: Right: No evidence of common femoral vein obstruction. Left: There is no evidence of deep vein thrombosis in the lower extremity. However, portions of this examination were limited- see technologist comments above. No cystic structure found in the popliteal fossa. Pulsatile venous flow is suggestive of potentially elevated right-sided heart failure.  *See table(s) above for measurements and observations. Electronically signed by Curt Jews MD on 05/08/2018 at 9:42:00 PM.    Final    [place] Ekg Site Rite  Result Date: 04/25/2018 If Site Rite image not attached, placement could not be confirmed due to current cardiac rhythm.  Ir Paracentesis  Result Date: 04/25/2018 INDICATION: Patient with history of CLL, atrial fibrillation severe right ventricular heart failure and recurrent ascites. Patient underwent paracentesis yesterday in IR with 4.1 L of clear yellow fluid removed. He has since been admitted to the hospital due worsening peripheral edema, lower extremity wound and dyspnea. Request for repeat diagnostic and therapeutic paracentesis today in IR. EXAM: ULTRASOUND GUIDED DIAGNOSTIC AND THERAPEUTIC PARACENTESIS MEDICATIONS: 10 mL 1% lidocaine. COMPLICATIONS: None immediate. PROCEDURE:  Informed written consent was obtained from the patient after a discussion of the risks, benefits and alternatives to treatment. A timeout was performed prior to the initiation of the procedure. Initial ultrasound scanning demonstrates a moderate amount of ascites within the left lower abdominal quadrant. The left lower abdomen was prepped and draped in the usual sterile fashion. 1% lidocaine was used for local anesthesia. Following this, a 19 gauge,  7-cm, Yueh catheter was introduced. An ultrasound image was saved for documentation purposes. The paracentesis was performed. The catheter was removed and a dressing was applied. The patient tolerated the procedure well without immediate post procedural complication. FINDINGS: A total of approximately 1.3 L of amber fluid was removed. Samples were sent to the laboratory as requested by the clinical team. IMPRESSION: Successful ultrasound-guided paracentesis yielding 1.3 liters of peritoneal fluid. Read by Candiss Norse, PA-C Electronically Signed   By: Jacqulynn Cadet M.D.   On: 04/25/2018 15:48   Ir Paracentesis  Result Date: 04/24/2018 INDICATION: History of CHF and lymphoma. Recurrent ascites. Request for therapeutic paracentesis. EXAM: ULTRASOUND GUIDED LEFT LOWER QUADRANT PARACENTESIS MEDICATIONS: None. COMPLICATIONS: None immediate. PROCEDURE: Informed written consent was obtained from the patient after a discussion of the risks, benefits and alternatives to treatment. A timeout was performed prior to the initiation of the procedure. Initial ultrasound scanning demonstrates a large amount of ascites within the left lower abdominal quadrant. The left lower abdomen was prepped and draped in the usual sterile fashion. 1% lidocaine with epinephrine was used for local anesthesia. Following this, a 19 gauge, 7-cm, Yueh catheter was introduced. An ultrasound image was saved for documentation purposes. The paracentesis was performed. The catheter was removed and  a dressing was applied. The patient tolerated the procedure well without immediate post procedural complication. FINDINGS: A total of approximately 4.1 L of clear yellow fluid was removed. IMPRESSION: Successful ultrasound-guided paracentesis yielding 4.1 liters of peritoneal fluid. Read by: Ascencion Dike PA-C Electronically Signed   By: Marybelle Killings M.D.   On: 04/24/2018 15:21   (Echo, Carotid, EGD, Colonoscopy, ERCP)    Subjective: Resting well, family at bedside. Family at bedside.  Discharge Exam: Vitals:   05/15/18 0845 05/15/18 1221  BP:  118/69  Pulse: 65 65  Resp:  18  Temp:  97.8 F (36.6 C)  SpO2:  98%   Vitals:   05/15/18 0739 05/15/18 0843 05/15/18 0845 05/15/18 1221  BP: 118/67 117/67  118/69  Pulse: 67  65 65  Resp: 16   18  Temp: 97.8 F (36.6 C)   97.8 F (36.6 C)  TempSrc: Oral   Axillary  SpO2: 100%   98%  Weight:      Height:        General: Pt is alert, awake, not in acute distress Cardiovascular: RRR, S1/S2 +, no rubs, no gallops Respiratory: CTA bilaterally, no wheezing, no rhonchi Abdominal: Soft, NT, ND, bowel sounds + Extremities: no edema, no cyanosis   The results of significant diagnostics from this hospitalization (including imaging, microbiology, ancillary and laboratory) are listed below for reference.     Microbiology: Recent Results (from the past 240 hour(s))  Aerobic Culture (superficial specimen)     Status: None   Collection Time: 05/13/18  8:30 AM  Result Value Ref Range Status   Specimen Description WOUND  Final   Special Requests HEMATOMA  Final   Gram Stain   Final    MODERATE WBC PRESENT, PREDOMINANTLY PMN MODERATE GRAM NEGATIVE RODS FEW GRAM POSITIVE RODS    Culture   Final    NO GROWTH 2 DAYS Performed at [facility], [address] [place], [place] [address]    Report Status 05/15/2018 FINAL  Final     Labs: BNP (last 3 results) Recent Labs    04/25/18 0949  BNP 9,935.7*   Basic Metabolic  Panel: Recent Labs  Lab 05/11/18 0812 05/12/18 0456 05/13/18 0538 05/14/18  0500 05/15/18 0432  NA 131* 129* 130* 131* 131*  K 3.6 3.8 3.6 3.8 3.9  CL 87* 88* 92* 90* 90*  CO2 30 28 30 30 30   GLUCOSE 165* 146* 138* 135* 114*  BUN 32* 27* 25* 25* 26*  CREATININE 1.07 1.00 0.98 0.90 0.92  CALCIUM 8.5* 8.5* 8.1* 8.6* 8.6*   Liver Function Tests: Recent Labs  Lab 05/10/18 0439 05/11/18 0812 05/12/18 0456 05/13/18 0538 05/14/18 0500  AST 33 30 35 24 28  ALT 26 24 25 22 22   ALKPHOS 137* 145* 161* 135* 145*  BILITOT 0.9 0.7 0.8 0.7 0.5  PROT 5.2* 5.0* 5.2* 4.9* 5.1*  ALBUMIN 2.7* 2.6* 2.6* 2.5* 2.6*   No results for input(s): LIPASE, AMYLASE in the last 168 hours. No results for input(s): AMMONIA in the last 168 hours. CBC: Recent Labs  Lab 05/10/18 0439 05/11/18 0812 05/12/18 0456 05/13/18 0538 05/14/18 0500 05/15/18 0432  WBC 14.1* 12.9* 14.6* 11.7* 8.9  --   HGB 8.1* 8.2* 8.7* 8.0* 8.5* 9.0*  HCT 26.4* 26.8* 27.8* 26.2* 27.4* 29.2*  MCV 91.0 89.6 86.3 87.0 88.4  --   PLT 139* 146* 196 176 189  --    Cardiac Enzymes: No results for input(s): CKTOTAL, CKMB, CKMBINDEX, TROPONINI in the last 168 hours. BNP: Invalid input(s): POCBNP CBG: No results for input(s): GLUCAP in the last 168 hours. D-Dimer No results for input(s): DDIMER in the last 72 hours. Hgb A1c No results for input(s): HGBA1C in the last 72 hours. Lipid Profile No results for input(s): CHOL, HDL, LDLCALC, TRIG, CHOLHDL, LDLDIRECT in the last 72 hours. Thyroid function studies No results for input(s): TSH, T4TOTAL, T3FREE, THYROIDAB in the last 72 hours.  Invalid input(s): FREET3 Anemia work up No results for input(s): VITAMINB12, FOLATE, FERRITIN, TIBC, IRON, RETICCTPCT in the last 72 hours. Urinalysis No results found for: COLORURINE, APPEARANCEUR, [place], [place], [place], [place], [place], [place], PROTEINUR, UROBILINOGEN, NITRITE, LEUKOCYTESUR Sepsis Labs Invalid input(s):  PROCALCITONIN,  WBC,  LACTICIDVEN Microbiology Recent Results (from the past 240 hour(s))  Aerobic Culture (superficial specimen)     Status: None   Collection Time: 05/13/18  8:30 AM  Result Value Ref Range Status   Specimen Description WOUND  Final   Special Requests HEMATOMA  Final   Gram Stain   Final    MODERATE WBC PRESENT, PREDOMINANTLY PMN MODERATE GRAM NEGATIVE RODS FEW GRAM POSITIVE RODS    Culture   Final    NO GROWTH 2 DAYS Performed at [facility], [address], [place], [place] [address]    Report Status 05/15/2018 FINAL  Final     Time coordinating discharge: 35*minutes  SIGNED:  Antonieta Pert, MD  [facility] 05/15/2018, 2:57 PM  If 7PM-7AM, please contact night-coverage [email or web address]

## 2018-05-15 NOTE — Progress Notes (Signed)
   05/15/18 1026  Clinical Encounter Type  Visited With Patient and family together  Visit Type Follow-up  Referral From Chaplain  Consult/Referral To Chaplain  The chaplain followed up with spiritual care for Pt. and Pt. daughter and wife.  The chaplain understands discharge may be in today's plans. The Pt. welcomed the chaplain into the room and reminded her his Jake Bathe has been present.  The Pt. 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  The chaplain noted the Pt. has a clear picture of the plans for Tuesday.  The family is surrounding the Pt. with the spiritual care to transition home.  The chaplain is available for F/U spiritual care as needed.

## 2018-05-15 NOTE — Progress Notes (Signed)
Physical Therapy Treatment Patient Details Name: Antonio Carrillo MRN: [id number] DOB: [birth year] Today's Date: 05/15/2018    History of Present Illness Antonio Carrillo is a 78 y.o. male with medical history significant of CLL, hypertension, hyperlipidemia, paroxysmal A. 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 Was started on a Milrinone gtt and getting diuresed with [facility] Following. During the hospitalization his Left Leg developed  new warmth and Erythema and ? Venous Stasis changes vs. Cellulitis but Cardiology started the patient on IV Vancomycin.    PT Comments    Pt progressing steadily towards his PT goals. Ambulating 200 feet x 2 with walker and min guard assist; requiring multiple standing rest breaks. Continues with decreased endurance. Education re: energy conservation, generalized walking program, endurance strategies, daily weight assessments, limiting sodium intake. Pt verbalizes understanding.    Follow Up Recommendations  Home health PT;Supervision/Assistance - 24 hour     Equipment Recommendations  Wheelchair (measurements PT);Wheelchair cushion (measurements PT)(for long distances)    Recommendations for Other Services       Precautions / Restrictions Precautions Precautions: Fall Restrictions Weight Bearing Restrictions: No RLE Weight Bearing: Weight bearing as tolerated LLE Weight Bearing: Weight bearing as tolerated    Mobility  Bed Mobility Overal bed mobility: Modified Independent             General bed mobility comments: HOB up  Transfers Overall transfer level: Needs assistance Equipment used: Rolling walker (2 wheeled) Transfers: Sit to/from Stand Sit to Stand: Min assist         General transfer comment: very light min  assist to boost up to stand using gait belt  Ambulation/Gait Ambulation/Gait assistance: Min guard Gait Distance (Feet): 200 Feet(x2) Assistive device: Rolling walker (2 wheeled) Gait Pattern/deviations: Step-through pattern;Decreased stride length;Wide base of support;Trunk flexed Gait velocity: reduced   General Gait Details: required multiple standing rest breaks   Stairs             Wheelchair Mobility    Modified Rankin (Stroke Patients Only)       Balance Overall balance assessment: Needs assistance Sitting-balance support: Feet supported Sitting balance-Leahy Scale: Good     Standing balance support: Bilateral upper extremity supported;During functional activity Standing balance-Leahy Scale: Fair                              Cognition Arousal/Alertness: Awake/alert Behavior During Therapy: WFL for tasks assessed/performed Overall Cognitive Status: Within Functional Limits for tasks assessed                                        Exercises      General Comments General comments (skin integrity, edema, etc.): VSS      Pertinent Vitals/Pain Pain Assessment: No/denies pain    Home Living                      Prior Function            PT Goals (current goals can now be found in the care plan section) Acute Rehab PT Goals Patient Stated Goal: to walk and go home PT Goal Formulation: With patient  Time For Goal Achievement: 05/18/18 Potential to Achieve Goals: Good Progress towards PT goals: Progressing toward goals    Frequency    Min 3X/week      PT Plan Current plan remains appropriate    Co-evaluation              AM-PAC PT "6 Clicks" Mobility   Outcome Measure  Help needed turning from your back to your side while in a flat bed without using bedrails?: None Help needed moving from lying on your back to sitting on the side of a flat bed without using bedrails?: None Help needed moving to  and from a bed to a chair (including a wheelchair)?: A Little Help needed standing up from a chair using your arms (e.g., wheelchair or bedside chair)?: A Little Help needed to walk in hospital room?: A Little Help needed climbing 3-5 steps with a railing? : A Lot 6 Click Score: 19    End of Session Equipment Utilized During Treatment: Gait belt Activity Tolerance: Patient tolerated treatment well Patient left: in bed;with call bell/phone within reach;with family/visitor present Nurse Communication: Mobility status PT Visit Diagnosis: Unsteadiness on feet (R26.81);Muscle weakness (generalized) (M62.81)     Time: 0677-0340 PT Time Calculation (min) (ACUTE ONLY): 13 min  Charges:  $Therapeutic Activity: 8-22 mins                     Ellamae Sia, PT, DPT Acute Rehabilitation Services Pager [phone] Office [phone]    Willy Eddy 05/15/2018, 4:11 PM

## 2018-05-15 NOTE — Care Management Note (Signed)
Case Management Note [name] RN, BSN Transitions of Care Unit 4E- RN Case Manager [phone]  Patient Details  Name: Antonio Carrillo MRN: [id number] Date of Birth: [birth year]  Subjective/Objective:    Pt admitted with vol. Overload, HF              Action/Plan: PTA pt lived at home with wife, referral received for transition of care needs, PC community referral. CM spoke with pt and spouse at bedside, discussed pt's goal to return home with GOL. Pt reports that he has needed DME at home for now, is interested in [facility] referral at time of discharge discussed what this type of referral means and what type of services are offered vs Hospice services in the home. Pt and wife state that they would like a referral to [facility] for PC on discharge- CM will call and make this referral prior to discharge. Also discussed [place] services and private duty care- pt has used [facility] with [place] in past , list provided Per CMS guidelines from medicare.gov website with star ratings (copy placed in shadow chart) for them to review for choice- once orders placed CM will f/u for choice and make any referrals needed. Offered to goggle options for private duty- wife and pt both declined at this time stating that they could do this and f/u with researching resources for this. CM will continue to follow for transition of care needs.   Expected Discharge Date:  05/15/18               Expected Discharge Plan:  [place]  In-House Referral:  Hospice / Palliative Care  Discharge planning Services  CM Consult  Post Acute Care Choice:  Durable Medical Equipment, Home Health, Hospice Choice offered to:  Patient, Spouse  DME Arranged:  3-N-1, Wheelchair manual DME Agency:  [facility] Arranged:  RN, Disease Management, PT [place] Agency:  [facility]  Status of Service:  Completed, signed off  If discussed at [place] of Stay Meetings, dates discussed:    Discharge Disposition:  home/home health   Additional Comments:  05/15/18- 1100- Jayden Kratochvil RN, CM- pt for transition home today- spoke with [facility] with [facility] and pt has been approved for w/c- [facility] to deliver both w/c and 3n1 to room this am, [facility] orders have also been updated and include [facility] orders and wound care- call made to [facility] with [facility] to update on [facility] needs who will f/u on new orders. Spoke with pt, wife and daughter at bedside to confirm all transition plans, [facility] pharmacy to deliver meds to bedside prior to discharge.   05/14/18- 1030- Tiffanni Scarfo RN, CM-  Call made this AM to [facility] for PC referral for community home PC f/u per pt and family request- referral to be f/u with per [name] for community based PC services. Call made to [facility] with [name] for [facility] services per pt/family choice- referral has been accepted for HHRN/PT- orders have been placed per MD. [name] again with pt and daughter at bedside- went over referrals that have been placed- and DME needs again- have placed a msg to attending MD about DME request- orders have been placed for 3n1 and w/c- spoke with PT who will have to f/u regarding w/c request and place narrative note if agreeable with request for w/c. CM will f/u for DME needs- and have delivered to room per [facility] prior to discharge. Pharmacy on alert for [facility] med needs.  Per pt his PCP- is at [facility] in [place] [facility])- [name]. Pt will primarily be followed by HF clinic on discharge.   05/13/18- 1500- Jannis Atkins RN, CM- f/u done with pt and daughter at bedside for transition of care needs- pt has been weaned off Milrinone drip today with plan for transition home if does well possibly 2/12/2- discussed HH and DME needs with pt and daughter, confirmed plans for referral to [facility] for PC f/u in the home. CM will call in am to make PC referral to [name] per pt request. Discussed choice for [facility] agencies based on CMS list that was provided on previous visit - pt and daughter  have provided CM with top three agencies to f/u with for [facility] needs- CM will call these agencies based on order of preference. Will need HH orders for RN/PT prior to discharge.  Pt would also like a 3n1-DME, has RW and transport chair at home. Asking about a w/c- however if pt got RW in last 5 years medicare will not cover w/c at this time- CM will check into this to see when RW was received. Benefits check was completed on Eliquis and per pt copay cost came out to what he has been paying PTA. Discussed using [facility] pharmacy for filling meds prior to discharge- pt and daughter are interested in using [facility] for ease of transition home on discharge- MD would need to send scripts for discharge to [facility] to fill and deliver to bedside.  CM will f/u with pt and family tomorrow for further transition planning.   Dawayne Patricia, RN 05/15/2018, 11:04 AM

## 2018-05-16 ENCOUNTER — Other Ambulatory Visit (HOSPITAL_COMMUNITY): Payer: Self-pay

## 2018-05-16 DIAGNOSIS — I110 Hypertensive heart disease with heart failure: Secondary | ICD-10-CM

## 2018-05-16 MED ORDER — SPIRONOLACTONE 25 MG PO TABS: 25.0000 mg | ORAL_TABLET | Freq: Every day | ORAL | 6 refills | Status: AC

## 2018-05-21 ENCOUNTER — Ambulatory Visit (HOSPITAL_COMMUNITY)
Admit: 2018-05-21 | Discharge: 2018-05-21 | Disposition: A | Discharge status: Home / Self Care | Payer: Medicare Other | Source: Ambulatory Visit | Attending: Internal Medicine | Admitting: Internal Medicine

## 2018-05-21 VITALS — BP 138/70 | HR 52 | Wt 185.6 lb

## 2018-05-21 DIAGNOSIS — I110 Hypertensive heart disease with heart failure: Secondary | ICD-10-CM | POA: Insufficient documentation

## 2018-05-21 DIAGNOSIS — C9110 Chronic lymphocytic leukemia of B-cell type not having achieved remission: Secondary | ICD-10-CM | POA: Insufficient documentation

## 2018-05-21 DIAGNOSIS — I480 Paroxysmal atrial fibrillation: Secondary | ICD-10-CM | POA: Diagnosis not present

## 2018-05-21 DIAGNOSIS — Z79899 Other long term (current) drug therapy: Secondary | ICD-10-CM | POA: Diagnosis not present

## 2018-05-21 DIAGNOSIS — I50810 Right heart failure, unspecified: Secondary | ICD-10-CM

## 2018-05-21 DIAGNOSIS — I5032 Chronic diastolic (congestive) heart failure: Secondary | ICD-10-CM | POA: Diagnosis not present

## 2018-05-21 DIAGNOSIS — I2510 Atherosclerotic heart disease of native coronary artery without angina pectoris: Secondary | ICD-10-CM | POA: Insufficient documentation

## 2018-05-21 DIAGNOSIS — E785 Hyperlipidemia, unspecified: Secondary | ICD-10-CM | POA: Diagnosis not present

## 2018-05-21 DIAGNOSIS — Z951 Presence of aortocoronary bypass graft: Secondary | ICD-10-CM | POA: Insufficient documentation

## 2018-05-21 DIAGNOSIS — Z7901 Long term (current) use of anticoagulants: Secondary | ICD-10-CM | POA: Insufficient documentation

## 2018-05-21 DIAGNOSIS — R188 Other ascites: Secondary | ICD-10-CM

## 2018-05-21 LAB — BASIC METABOLIC PANEL
Anion gap: 13 (ref 5–15)
BUN: 26 mg/dL — ABNORMAL HIGH (ref 8–23)
CO2: 26 mmol/L (ref 22–32)
Calcium: 9 mg/dL (ref 8.9–10.3)
Chloride: 93 mmol/L — ABNORMAL LOW (ref 98–111)
Creatinine, Ser: 0.96 mg/dL (ref 0.61–1.24)
GFR calc Af Amer: >60 mL/min (ref >60)
GFR calc non Af Amer: >60 mL/min (ref >60)
GLUCOSE: 145 mg/dL — AB (ref 70–99)
Potassium: 4.1 mmol/L (ref 3.5–5.1)
Sodium: 132 mmol/L — ABNORMAL LOW (ref 135–145)

## 2018-05-21 LAB — CBC
HCT: 31.5 % — ABNORMAL LOW (ref 39.0–52.0)
Hemoglobin: 9.5 g/dL — ABNORMAL LOW (ref 13.0–17.0)
MCH: 27.1 pg (ref 26.0–34.0)
MCHC: 30.2 g/dL (ref 30.0–36.0)
MCV: 90 fL (ref 80.0–100.0)
Platelets: 226 10*3/uL (ref 150–400)
RBC: 3.5 MIL/uL — ABNORMAL LOW (ref 4.22–5.81)
RDW: 16.9 % — ABNORMAL HIGH (ref 11.5–15.5)
WBC: 9.2 10*3/uL (ref 4.0–10.5)
nRBC: 0 % (ref 0.0–0.2)

## 2018-05-21 MED ORDER — AMIODARONE HCL 200 MG PO TABS: 200.0000 mg | ORAL_TABLET | Freq: Two times a day (BID) | ORAL | 3 refills | Status: DC

## 2018-05-21 NOTE — Patient Instructions (Addendum)
Decrease Amiodarone to 200mg  twice daily.  Routine lab work today. Will notify you of abnormal results  Your provider requests you have a paracentesis on 05/28/2018 at 10:00 Please arrive at [facility] Radiology department 15 minutes prior to appointment for registration.   Follow up with Dr.Bensimhon in 4 weeks.

## 2018-05-21 NOTE — Progress Notes (Signed)
Advanced Heart Failure Clinic Note   Primary Cardiologist: Crenshaw  HPI:  Antonio Carrillo is a 78 y.o. male  with h/o AF s/p several ablations, aortic root aneurysm, s/p aortic root replacement with CABG 1, RV failure and CLL.   He was diagnosed with atrial fibrillation in 1977. He failed medical therapy with flecainide, rhythmol and multaq. Not tried on TIkosyn or sotalol due to prolonged QT.Was started on amio. Was on amio for many years but stopped in 2017 for unclear reasons. He underwent atrial flutter ablation in [place] late 1990s. Patient has had 3 previous atrial fibrillation ablations. In February 2016 the patient had aortic root replacement with CABG 1, Maze and left atrial appendage ligation at [facility].  More recently has been followed at [facility]. 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 Has been treated with Venetoclax, he has had fluid retention. In the Spring 2019 underwent pelvic XRT x 10 for CLL and bulky lymphadenopathy. Also recently stopped Vanclexta.  He has had frequent adjustments of his diuretics in the setting of severe RV failure. Previously started on metolazone in addition to bumex. Had brisk response and felt it was too much so they stopped. Also had recurrent AF. Underwent DC-CV 03/07/17 at [facility]. Went back into AF at end of December.   He suffers from significant ascites requiring paracentesis with increase frequency. He had 3.0 L off 04/08/2018 and 4.1 L off 04/24/2018  Had prolonged admission 1.23-2/12/20 for severe RV failure and AKI. Initial co-ox 29%. Was on milrinone 0.5 and high-dose IV lasix. Diuresed about 40 pounds. D/c weight 185 pounds. Hospitalization also c/b LLE hematoma. PAF treated with IV amio Milrinone weaned off. Co-ox on d/c was 59%  He is here for post-hospital f/u. Feels much better. Getting around the house without too much trouble. Has HHPT coming out. LE edema much improved. Mild ab distension. Saw Dr. Doreatha Martin yesterday and felt both legs healing well.  Weight was down to 182 at home now back up to 185. On bumex 3mg  bid   Echo 04/25/18 EF 55-60% RV markedly dilated moderate HK. Septal flattening  severe TR  Echo 10/2017 LVEF read as 35-40%, but over-read by Dr. Haroldine Laws to be ~50-55%. RV down  Echo 05/18/17 LVEF normal RV markedly dilated and moderately HK. Moderate TR. IVC dilated   Recent CT scan 04/06/17 showed anasarca, ascites and small pleural effusions and significant retroperitoneal lymphadenopathy  [facility] 4/18 1. Left ventricle is severely dilated (volume index 120 ml/m2) with  normal wall thickness. Left ventricular (LV) systolic function is  preserved (EF 54% with diastolic and systolic septal flattening consistent with right ventricular pressure/volume overload.   2. There is no late gadolinium enhancement (LGE) in the LV myocardium.  3. Right ventricle (RV) is severely dilated (volume index 181 ml/m2) with mildly reduced systolic function (EF 56%). There is no evidence of  ventricular interdependence.  4. There is severe biatrial enlargement. The interatrial septum bows towards the left atrium, suggestive of increased right atrial pressure.   5. Visualized valves (aortic, mitral, and tricuspid) appear pliable. There is regurgitation present of all three valves, although this was  not quantified on this study.   6. There is no pericardial effusion. Pericardium does not appear thickened. There is no LGE in the pericardium.    Past Medical History:  Diagnosis Date  . Anticoagulants causing adverse effect in therapeutic use   . Aortic aneurysm [facility])    s/p surgery at [facility]  .  HTN (hypertension)   .  Hyperlipidemia   . Hyperthyroidism    resolved per pt  . 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 Persistent atrial fibrillation    s/p ablations x3  . Testicular failure    Current Outpatient Medications  Medication Sig Dispense Refill  . allopurinol (ZYLOPRIM) 300 MG tablet Take 300 mg by mouth daily.    Marland Kitchen amiodarone (PACERONE) 400 MG tablet Take 1 tablet (400 mg total) by mouth 2 (two) times daily. 60 tablet 0  . apixaban (ELIQUIS) 5 MG TABS tablet TAKE ONE TABLET BY MOUTH 2 TIMES A DAY (Patient taking differently: Take 5 mg by mouth 2 (two) times daily. ) 180 tablet 3  . BIOTIN PO Take 1 tablet by mouth daily.    . bumetanide (BUMEX) 1 MG tablet Take 3 tablets (3 mg total) by mouth 2 (two) times daily for 30 days. 180 tablet 0  . cephALEXin (KEFLEX) 500 MG capsule Take 1 capsule (500 mg total) by mouth every 12 (twelve) hours for 7 days. 14 capsule 0  . diclofenac sodium (VOLTAREN) 1 % GEL Apply 2 g topically 4 (four) times daily. 100 g 0  . digoxin (LANOXIN) 0.125 MG tablet Take 1 tablet (0.125 mg total) by mouth daily. 30 tablet 0  . HYDROcodone-acetaminophen (NORCO/VICODIN) 5-325 MG tablet Take 1 tablet by mouth 3 (three) times daily as needed for up to 12 doses for moderate pain. 12 tablet 0  . iron polysaccharides (NIFEREX) 150 MG capsule Take 1 capsule (150 mg total) by mouth daily. 30 capsule 0  . lactobacillus acidophilus (BACID) TABS tablet Take 2 tablets by mouth daily.    Marland Kitchen lactulose (CHRONULAC) 10 GM/15ML solution Take 30 mLs (20 g total) by mouth daily as needed for up to 8 doses for severe constipation. 236 mL 0  . LORazepam (ATIVAN) 0.5 MG tablet Take 0.5 mg by mouth every 8 (eight) hours as needed for anxiety.    . magic mouthwash w/lidocaine SOLN Take 5 mLs by mouth 4 (four) times daily as needed for up to 30 days for mouth pain. 500 mL 0  . ondansetron (ZOFRAN) 8 MG tablet Take 8 mg by mouth every 8 (eight) hours as needed for nausea or vomiting.    .  polyethylene glycol (MIRALAX / GLYCOLAX) packet Take 17 g by mouth daily as needed for moderate constipation. 14 each 0  . potassium chloride SA (K-DUR,KLOR-CON) 20 MEQ tablet Take 2 tablets (40 mEq) by mouth daily (Patient taking differently: Take 60 mEq by mouth daily. ) 180 tablet 1  . prochlorperazine (COMPAZINE) 10 MG tablet Take 10 mg by mouth every 6 (six) hours as needed for nausea or vomiting.    . senna-docusate (SENOKOT-S) 8.6-50 MG tablet Take 1 tablet by mouth 2 (two) times daily. 60 tablet 0  . sertraline (ZOLOFT) 100 MG tablet Take 200 mg by mouth daily.     Marland Kitchen spironolactone (ALDACTONE) 25 MG tablet Take 1 tablet (25 mg total) by mouth daily. 60 tablet 6  . terbinafine (LAMISIL) 250 MG tablet Take 250 mg by mouth daily.     No current facility-administered medications for this encounter.    No Known Allergies  Social History   Socioeconomic History  . Marital status: Married    Spouse name: Not on file  . Number of children: Not on file  . Years of education: Not on file  . Highest education level: Not on file  Occupational History  . Occupation: retired    Fish farm manager: RETIRED  Comment: CFO for [employer]  . Financial resource strain: Not on file  . Food insecurity:    Worry: Not on file    Inability: Not on file  . Transportation needs:    Medical: Not on file    Non-medical: Not on file  Tobacco Use  . Smoking status: Never Smoker  . Smokeless tobacco: Never Used  Substance and Sexual Activity  . Alcohol use: No  . Drug use: No  . Sexual activity: Yes    Partners: Female  Lifestyle  . Physical activity:    Days per week: Not on file    Minutes per session: Not on file  . Stress: Not on file  Relationships  . Social connections:    Talks on phone: Not on file    Gets together: Not on file    Attends religious service: Not on file    Active member of club or organization: Not on file    Attends meetings of clubs or organizations: Not on  file    Relationship status: Not on file  . Intimate partner violence:    Fear of current or ex partner: Not on file    Emotionally abused: Not on file    Physically abused: Not on file    Forced sexual activity: Not on file  Other Topics Concern  . Not on file  Social History Narrative   Lives in [place].    Family History  Problem Relation Age of Onset  . Arrhythmia Mother    Vitals:   05/21/18 0859  BP: 138/70  Pulse: (!) 52  SpO2: 96%  Weight: 84.2 kg (185 lb 9.6 Antonio)     Wt Readings from Last 3 Encounters:  05/21/18 84.2 kg (185 lb 9.6 Antonio)  05/15/18 84.1 kg (185 lb 4.8 Antonio)  04/25/18 101.6 kg (224 lb)    PHYSICAL EXAM: General:  Sitting in chair No resp difficulty HEENT: normal Neck: supple. JVP 8-9 prominent CV waves Carotids 2+ bilat; no bruits. No lymphadenopathy or thryomegaly appreciated. Cor: PMI nondisplaced. Regular rate & rhythm. 2/6 TR Lungs: clear Abdomen: soft, nontender, mildly distended. No hepatosplenomegaly. No bruits or masses. Good bowel sounds. Extremities: no cyanosis, clubbing, rash, edema. LLE dressing Neuro: alert & orientedx3, cranial nerves grossly intact. moves all 4 extremities w/o difficulty. Affect pleasant   ASSESSMENT & PLAN: 1. 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 RV severely dilated and mild RV dysfunction - Echo 2/19. Normal LVEF with significant RV failure - Echo 10/2017 LVEF read as 35-40%, but over-read by Dr. Haroldine Laws to be ~50-55%. RV down. Stable per Dr. Haroldine Laws - Echo 04/25/18 EF 55-60% RV markedly dilated moderate HK. Septal flattening  severe TR - Recent prolonged admission for severe RV failure requiring milrinone. Diuresed 40 pounds. D/c weight 2/20 was 185 - Looks great today. Will check labs.  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 CT without mention of cirrhotic changes. Ascites likely combination  of RV failure and pelvic CLL. Cytology from last paracentesis without malignant cells. Requires frequent paracentesis - Has severe TR. Doubt he is candidate for experimental TV clip with comorbidities but will d/w structural team.   2. Recurrent PAF - S/p multiple ablations and MAZE.  - Maintaining NSR - Cut amio from 400 bid to 200 bid - Regular on exam today.  - Continue Eliquis. Tolerating well   3. CLL -  Per Onocology - Now s/p pelvic XRT  - Seems much improved after stopping Vanclexta  4. CAD and CABG x 1 - No s/s of ischemia   5. Aortic root aneurysm s/p repair  - Stable.   6. LLE hematoma and RLE laceration - Seen by Dr. Doreatha Martin and much improved  Glori Bickers, MD  9:08 AM

## 2018-05-21 NOTE — Addendum Note (Signed)
Encounter addended by: Harvie Junior, CMA on: 05/21/2018 9:53 AM  Actions taken: Pharmacy for encounter modified, Order list changed, Diagnosis association updated, Clinical Note Signed

## 2018-05-28 ENCOUNTER — Other Ambulatory Visit (HOSPITAL_COMMUNITY): Payer: Self-pay | Admitting: Internal Medicine

## 2018-05-28 ENCOUNTER — Ambulatory Visit (HOSPITAL_COMMUNITY)
Admission: RE | Admit: 2018-05-28 | Discharge: 2018-05-28 | Disposition: A | Discharge status: Home / Self Care | Payer: Medicare Other | Source: Ambulatory Visit | Attending: Internal Medicine | Admitting: Internal Medicine

## 2018-05-28 ENCOUNTER — Encounter (HOSPITAL_COMMUNITY): Payer: Medicare Other | Admitting: Internal Medicine

## 2018-05-28 DIAGNOSIS — R188 Other ascites: Secondary | ICD-10-CM

## 2018-05-28 MED ORDER — LIDOCAINE HCL 1 % IJ SOLN
INTRAMUSCULAR | Status: AC
  Filled 2018-05-28: qty 20

## 2018-05-28 NOTE — Progress Notes (Signed)
Patient presented to [facility] IR for possible paracentesis. Limited US Abdomen performed today shows no fluid.   No procedure performed.  Brynda Greathouse, MS RD PA-C 10:20 AM

## 2018-06-07 ENCOUNTER — Other Ambulatory Visit (HOSPITAL_COMMUNITY): Payer: Self-pay | Admitting: Internal Medicine

## 2018-06-17 NOTE — Telephone Encounter (Signed)
Pt called to report a BLEE and 10lb weight gain in 1-2 months. Per Jonni Sanger take 2.5mg  of metolazone tomorrow morning with 30meq of K. Patient will call Wednesday to report weight.

## 2018-06-18 ENCOUNTER — Other Ambulatory Visit (HOSPITAL_COMMUNITY): Payer: Self-pay

## 2018-06-18 DIAGNOSIS — R188 Other ascites: Secondary | ICD-10-CM

## 2018-06-18 MED ORDER — METOLAZONE 2.5 MG PO TABS: 2.5000 mg | ORAL_TABLET | Freq: Every day | ORAL | 0 refills | Status: DC

## 2018-06-18 NOTE — Progress Notes (Signed)
Ordered per DB PRN per pt request

## 2018-06-19 ENCOUNTER — Ambulatory Visit (HOSPITAL_COMMUNITY): Payer: Medicare Other

## 2018-06-19 ENCOUNTER — Encounter (HOSPITAL_COMMUNITY): Payer: Self-pay | Admitting: Physician Assistant

## 2018-06-19 ENCOUNTER — Ambulatory Visit (HOSPITAL_COMMUNITY)
Admission: RE | Admit: 2018-06-19 | Discharge: 2018-06-19 | Disposition: A | Discharge status: Home / Self Care | Payer: Medicare Other | Source: Ambulatory Visit | Attending: Internal Medicine | Admitting: Internal Medicine

## 2018-06-19 ENCOUNTER — Other Ambulatory Visit: Payer: Self-pay

## 2018-06-19 DIAGNOSIS — R188 Other ascites: Secondary | ICD-10-CM | POA: Diagnosis not present

## 2018-06-19 HISTORY — PX: IR PARACENTESIS: IMG2679

## 2018-06-19 MED ORDER — LIDOCAINE HCL 1 % IJ SOLN
INTRAMUSCULAR | Status: AC
  Filled 2018-06-19: qty 20

## 2018-06-19 MED ORDER — LIDOCAINE HCL 1 % IJ SOLN
INTRAMUSCULAR | Status: AC | PRN
  Administered 2018-06-19: 10 mL

## 2018-06-19 NOTE — Procedures (Signed)
PROCEDURE SUMMARY:  Successful image-guided paracentesis from the right upper abdomen.  Yielded 2.0 liters of clear yellow fluid.  No immediate complications.  EBL: zero Patient tolerated well.   Specimen was not sent for labs.  Please see imaging section of Epic for full dictation.  Joaquim Nam PA-C 06/19/2018 10:28 AM

## 2018-06-25 ENCOUNTER — Encounter (HOSPITAL_COMMUNITY): Payer: Medicare Other | Admitting: Internal Medicine

## 2018-07-03 ENCOUNTER — Telehealth (HOSPITAL_COMMUNITY): Payer: Self-pay

## 2018-07-03 NOTE — Telephone Encounter (Signed)
Signed order to hold on clients care (pt pref) due to CVD-19 singed DB faxed to [facility], confirmation received

## 2018-07-08 ENCOUNTER — Ambulatory Visit (HOSPITAL_COMMUNITY)
Admission: RE | Admit: 2018-07-08 | Discharge: 2018-07-08 | Disposition: A | Discharge status: Home / Self Care | Payer: Medicare Other | Source: Ambulatory Visit | Attending: Internal Medicine | Admitting: Internal Medicine

## 2018-07-08 ENCOUNTER — Encounter (HOSPITAL_COMMUNITY): Payer: Self-pay | Admitting: *Deleted

## 2018-07-08 ENCOUNTER — Other Ambulatory Visit: Payer: Self-pay

## 2018-07-08 DIAGNOSIS — I480 Paroxysmal atrial fibrillation: Secondary | ICD-10-CM

## 2018-07-08 DIAGNOSIS — R188 Other ascites: Secondary | ICD-10-CM

## 2018-07-08 DIAGNOSIS — I50810 Right heart failure, unspecified: Secondary | ICD-10-CM | POA: Diagnosis not present

## 2018-07-08 MED ORDER — METOLAZONE 5 MG PO TABS: 5.0000 mg | ORAL_TABLET | ORAL | 2 refills | Status: DC

## 2018-07-08 MED ORDER — POTASSIUM CHLORIDE CRYS ER 20 MEQ PO TBCR: 40.0000 meq | EXTENDED_RELEASE_TABLET | Freq: Every day | ORAL | 1 refills | Status: DC

## 2018-07-08 NOTE — Patient Instructions (Signed)
Start taking Metolazone 5 mg daily for 5 days  Take an extra 40 meq (2 tabs) of Potassium when you take Metolazone  Dr Haroldine Laws will call you again on Friday 07/12/2018 around 12 pm for a follow up check  Please call our office at [phone] or send a message through [employer]

## 2018-07-08 NOTE — Progress Notes (Signed)
Per Dr Haroldine Laws:  1. Please call in metolazone 5 mg daily for him. Take with Kdur 19meq extra. (Will need script at [facility])  2. Schedule f/u tele call with me on Friday.   RX sent in, AVS sent to pt via mychart, f/u telephone visit sch for Fri 4/10

## 2018-07-08 NOTE — Progress Notes (Signed)
Heart Failure TeleHealth Note  Due to national recommendations of social distancing due to [place] 19, Audio/video telehealth visit is felt to be most appropriate for this patient at this time.  See MyChart message from today for patient consent regarding telehealth for [name].  Date:  07/08/2018   ID:  Antonio Carrillo, DOB 1940/11/29, MRN [id number]  Location: Home  Provider location: [place] Type of Visit: Established patient  PCP:  Patient, No Pcp Per  Cardiologist:  No primary care provider on file. Primary HF: DB  Chief Complaint:  HF   History of Present Illness: Antonio Carrillo is a 78 y.o. male who presents via audio/video conferencing for a telehealth visit today.     Antonio Carrillo is a 78 y.o. male  with h/o CLL s/p chemo and XRT at [facility], aortic root aneurysm, s/p aortic root replacement with CABG 1, PAF s/p several ablations, cardiac cirrhosis and severe RV failur  He suffers from significant ascites requiring paracentesis with increase frequency. He had 3.0 L off 04/08/2018 and 4.1 L off 04/24/2018  Had prolonged admission 1.23-2/12/20 for severe RV failure and AKI. Initial co-ox 29%. Was on milrinone 0.5 and high-dose IV lasix. Diuresed about 40 pounds. D/c weight 185 pounds. Hospitalization also c/b LLE hematoma. PAF treated with IV amio Milrinone weaned off. Co-ox on d/c was 59%. Echo 04/25/18 EF 55-60% RV markedly dilated moderate HK. Septal flattening  severe TR  Says over the past few weeks weight has increased 30 pounds. Had a paracentesis 2 weeks ago and took off 2 liters. Remains on bumex 3 bid. Moderate urine output. Has only taken metolazone once a few weeks ago and worked well. Has not had since. Breathing ok. Remains very weak. Struggles with ADLs. Remains in NSR. No bleeding with Eliquis,   He denies symptoms of cough, fevers, chills, or new SOB worrisome for COVID 19.    Past Medical History:  Diagnosis Date  . Anticoagulants  causing adverse effect in therapeutic use   . Aortic aneurysm [facility])    s/p surgery at [facility]  . HTN (hypertension)   . Hyperlipidemia   . Hyperthyroidism    resolved per pt  . 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 Persistent atrial fibrillation    s/p ablations x3  . Testicular failure    Past Surgical History:  Procedure Laterality Date  . APPENDECTOMY    . ATRIAL ABLATION SURGERY  1. late 1990's  2. 02/01/06  3. 10/10   1. In [place]  2. At [facility]  3. Redo by [facility])  . BACK SURGERY    . CARDIOVERSION N/A 09/16/2012   Procedure: CARDIOVERSION;  Surgeon: Jolaine Artist, MD;  Location: [facility] ENDOSCOPY;  Service: Cardiovascular;  Laterality: N/A;  angie will call back /heather [phone]  . CARDIOVERSION N/A 07/15/2015   Procedure: CARDIOVERSION;  Surgeon: Herminio Commons, MD;  Location: [place];  Service: Cardiovascular;  Laterality: N/A;  . CARDIOVERSION N/A 12/24/2015   Procedure: CARDIOVERSION;  Surgeon: Sueanne Margarita, MD;  Location: [place];  Service: Cardiovascular;  Laterality: N/A;  . IR PARACENTESIS  04/13/2017  . IR PARACENTESIS  04/25/2017  . IR PARACENTESIS  05/18/2017  . IR PARACENTESIS  06/21/2017  . IR PARACENTESIS  07/26/2017  . IR PARACENTESIS  08/17/2017  . IR PARACENTESIS  09/03/2017  . IR PARACENTESIS  04/08/2018  . IR PARACENTESIS  04/24/2018  . IR PARACENTESIS  04/25/2018  .  IR PARACENTESIS  06/19/2018  . LAMINECTOMY     x2  . Stress cardiolite  01/29/06  . 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 TONSILLECTOMY    . TOTAL KNEE ARTHROPLASTY     bilateral  . TRANSTHORACIC ECHOCARDIOGRAM  02/08/06     Current Outpatient Medications  Medication Sig Dispense Refill  . allopurinol (ZYLOPRIM) 300 MG tablet Take 300 mg by mouth daily.    Marland Kitchen amiodarone (PACERONE) 200 MG tablet Take 1 tablet (200 mg total) by mouth 2 (two) times daily. 60 tablet 3  . apixaban (ELIQUIS) 5 MG TABS tablet TAKE ONE TABLET BY MOUTH 2 TIMES A  DAY (Patient taking differently: Take 5 mg by mouth 2 (two) times daily. ) 180 tablet 3  . BIOTIN PO Take 1 tablet by mouth daily.    . bumetanide (BUMEX) 1 MG tablet Take 3 tablets (3 mg total) by mouth 2 (two) times daily for 30 days. 180 tablet 0  . diclofenac sodium (VOLTAREN) 1 % GEL Apply 2 g topically 4 (four) times daily. 100 g 0  . DIGOX 125 MCG tablet Take 1 tablet (125 mcg total) by mouth daily. 30 tablet 5  . HYDROcodone-acetaminophen (NORCO/VICODIN) 5-325 MG tablet Take 1 tablet by mouth 3 (three) times daily as needed for up to 12 doses for moderate pain. 12 tablet 0  . iron polysaccharides (NIFEREX) 150 MG capsule Take 1 capsule (150 mg total) by mouth daily. 30 capsule 0  . lactobacillus acidophilus (BACID) TABS tablet Take 2 tablets by mouth daily.    Marland Kitchen lactulose (CHRONULAC) 10 GM/15ML solution Take 30 mLs (20 g total) by mouth daily as needed for up to 8 doses for severe constipation. 236 mL 0  . LORazepam (ATIVAN) 0.5 MG tablet Take 0.5 mg by mouth every 8 (eight) hours as needed for anxiety.    . metolazone (ZAROXOLYN) 2.5 MG tablet Take 1 tablet (2.5 mg total) by mouth daily. 1 tablet 0  . ondansetron (ZOFRAN) 8 MG tablet Take 8 mg by mouth every 8 (eight) hours as needed for nausea or vomiting.    . polyethylene glycol (MIRALAX / GLYCOLAX) packet Take 17 g by mouth daily as needed for moderate constipation. 14 each 0  . potassium chloride SA (K-DUR,KLOR-CON) 20 MEQ tablet Take 2 tablets (40 mEq) by mouth daily (Patient taking differently: Take 60 mEq by mouth daily. ) 180 tablet 1  . prochlorperazine (COMPAZINE) 10 MG tablet Take 10 mg by mouth every 6 (six) hours as needed for nausea or vomiting.    . senna-docusate (SENOKOT-S) 8.6-50 MG tablet Take 1 tablet by mouth 2 (two) times daily. 60 tablet 0  . sertraline (ZOLOFT) 100 MG tablet Take 200 mg by mouth daily.     Marland Kitchen spironolactone (ALDACTONE) 25 MG tablet Take 1 tablet (25 mg total) by mouth daily. 60 tablet 6  .  terbinafine (LAMISIL) 250 MG tablet Take 250 mg by mouth daily.     No current facility-administered medications for this encounter.     Allergies:   Patient has no known allergies.   Social History:  The patient  reports that he has never smoked. He has never used smokeless tobacco. He reports that he does not drink alcohol or use drugs.   Family History:  The patient's family history includes Arrhythmia in his mother.   ROS:  Please see the history of present illness.   All other systems are personally  reviewed and negative.   Exam:  (Video/Tele Health Call; Exam is subjective and or/visual.) General:  Well appearing. No resp difficulty on phone Diffuse LE edema and ab distension per his report Neuro: Alert & oriented x 3.   Recent Labs: 04/25/2018: B Natriuretic Peptide 1,544.3; TSH 8.246 05/08/2018: Magnesium 2.2 05/14/2018: ALT 22 05/21/2018: BUN 26; Creatinine, Ser 0.96; Hemoglobin 9.5; Platelets 226; Potassium 4.1; Sodium 132    Wt Readings from Last 3 Encounters:  05/21/18 84.2 kg (185 lb 9.6 oz)  05/15/18 84.1 kg (185 lb 4.8 oz)  04/25/18 101.6 kg (224 lb)      Other studies personally reviewed:   ASSESSMENT AND PLAN:  1. 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 RV severely dilated and mild RV dysfunction - Echo 2/19. Normal LVEF with significant RV failure - Echo 10/2017 LVEF Carrillo as 35-40%, but over-Carrillo by Dr. Haroldine Laws to be ~50-55%. RV down. Stable per Dr. Haroldine Laws - Echo 04/25/18 EF 55-60% RV markedly dilated moderate HK. Septal flattening  severe TR - Recent prolonged admission for severe RV failure requiring milrinone. Diuresed 40 pounds. D/c weight 2/20 was 185 - Weight back up 30 pounds with severe RV failure. He is reluctant to be readmitted in setting of COVID crisis - Will add metolazone 5 mg daily + KCL 40 extra x 5 days - Will have [facility] draw labs on Wednesday  - If not  responding or getting worse will need admission s - Has severe TR. Doubt he is candidate for experimental TV clip with comorbidities but will d/w structural team.  - F/u call on Friday  2. Recurrent PAF - S/p multiple ablations and MAZE.  - Maintaining NSR - Continue 200 bid - Continue Eliquis. Tolerating well   3. CLL - Per Onocology - Now s/p pelvic XRT  - Seems much improved after stopping Vanclexta  4. CAD and CABG x 1 - No s/s of ischemia    COVID screen The patient does not have any symptoms that suggest any further testing/ screening at this time.  Social distancing reinforced today.  Recommended follow-up:  Friday telehealth visit  Relevant cardiac medications were reviewed at length with the patient today.   The patient does not have concerns regarding their medications at this time.   The following changes were made today:  See above  Labs/ tests ordered today include: see above  Patient Risk: After full review of this patients clinical status, I feel that they are at moderate risk for cardiac decompensation at this time.  Today, I have spent 18 minutes with the patient with telehealth technology discussing the above .    Signed, Glori Bickers, MD  07/08/2018 2:24 PM  [facility] [address] Heart and [place] [address] [phone] (office) [phone] (fax)

## 2018-07-08 NOTE — Addendum Note (Signed)
Encounter addended by: Scarlette Calico, RN on: 07/08/2018 3:09 PM  Actions taken: Pharmacy for encounter modified, Order list changed, Clinical Note Signed

## 2018-07-09 ENCOUNTER — Telehealth (HOSPITAL_COMMUNITY): Payer: Medicare Other | Admitting: Internal Medicine

## 2018-07-12 ENCOUNTER — Other Ambulatory Visit: Payer: Self-pay

## 2018-07-12 ENCOUNTER — Encounter (HOSPITAL_COMMUNITY): Payer: Self-pay | Admitting: *Deleted

## 2018-07-12 ENCOUNTER — Ambulatory Visit (HOSPITAL_COMMUNITY)
Admission: RE | Admit: 2018-07-12 | Discharge: 2018-07-12 | Disposition: A | Discharge status: Home / Self Care | Payer: Medicare Other | Source: Ambulatory Visit | Attending: Internal Medicine | Admitting: Internal Medicine

## 2018-07-12 DIAGNOSIS — I50810 Right heart failure, unspecified: Secondary | ICD-10-CM

## 2018-07-12 DIAGNOSIS — I480 Paroxysmal atrial fibrillation: Secondary | ICD-10-CM

## 2018-07-12 DIAGNOSIS — I5022 Chronic systolic (congestive) heart failure: Secondary | ICD-10-CM

## 2018-07-12 NOTE — Patient Instructions (Signed)
Follow up telephone visit with Dr Haroldine Laws on Monday 4/13

## 2018-07-12 NOTE — Addendum Note (Signed)
Encounter addended by: Scarlette Calico, RN on: 07/12/2018 3:57 PM  Actions taken: Clinical Note Signed

## 2018-07-12 NOTE — Progress Notes (Signed)
Heart Failure TeleHealth Note  Due to national recommendations of social distancing due to [place] 19, Audio/video telehealth visit is felt to be most appropriate for this patient at this time.  See MyChart message from today for patient consent regarding telehealth for [facility].  Date:  07/12/2018   ID:  [name], DOB 1940/11/13, MRN [id number]  Location: Home  Provider location: [place] Type of Visit: Established patient  PCP:  Patient, No Pcp Per  Cardiologist:  No primary care provider on file. Primary HF: DB  Chief Complaint:  HF   History of Present Illness: Antonio Carrillo is a 78 y.o. male who presents via audio/video conferencing for a telehealth visit today.     [name] is a 78 y.o. male  with h/o CLL s/p chemo and XRT at [facility], aortic root aneurysm, s/p aortic root replacement with CABG 1, PAF s/p several ablations, cardiac cirrhosis and severe RV failure  He has ascites requiring frequent paracentesis.  Had prolonged admission 1.23-2/12/20 for severe RV failure and AKI. Initial co-ox 29%. Was on milrinone 0.5 and high-dose IV lasix. Diuresed about 40 pounds. D/c weight 185 pounds. Hospitalization also c/b LLE hematoma. PAF treated with IV amio Milrinone weaned off. Co-ox on d/c was 59%. Echo 04/25/18 EF 55-60% RV markedly dilated moderate HK. Septal flattening  severe TR  I spoke to him during a telehealth visit earlier this week and weight up 30 pounds. Reluctant to come in to hospital due to Covid. Started metolazone 5mg  daily.   Says he feels much better. Still with a lot of swelling but weight down 5-6 pounds this week to 209. Wrapping legs with ACE wraps. No orthopnea or PND. Abdomen still bloating. Still weak but feels he can get around better as fluid is coming off. No bleeding with Eliquis. Labs drawn today. Urine is clear.   He denies symptoms of cough, fevers, chills, or new SOB worrisome for COVID 19.    Past Medical  History:  Diagnosis Date  . Anticoagulants causing adverse effect in therapeutic use   . Aortic aneurysm [facility])    s/p surgery at [facility]  . HTN (hypertension)   . Hyperlipidemia   . Hyperthyroidism    resolved per pt  . 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 Persistent atrial fibrillation    s/p ablations x3  . Testicular failure    Past Surgical History:  Procedure Laterality Date  . APPENDECTOMY    . ATRIAL ABLATION SURGERY  1. late 1990's  2. 02/01/06  3. 10/10   1. In [place]  2. At [facility]  3. Redo by Greggory Brandy [facility])  . BACK SURGERY    . CARDIOVERSION N/A 09/16/2012   Procedure: CARDIOVERSION;  Surgeon: Jolaine Artist, MD;  Location: [facility] ENDOSCOPY;  Service: Cardiovascular;  Laterality: N/A;  angie will call back /heather [phone]  . CARDIOVERSION N/A 07/15/2015   Procedure: CARDIOVERSION;  Surgeon: Herminio Commons, MD;  Location: [place];  Service: Cardiovascular;  Laterality: N/A;  . CARDIOVERSION N/A 12/24/2015   Procedure: CARDIOVERSION;  Surgeon: Sueanne Margarita, MD;  Location: [place];  Service: Cardiovascular;  Laterality: N/A;  . IR PARACENTESIS  04/13/2017  . IR PARACENTESIS  04/25/2017  . IR PARACENTESIS  05/18/2017  . IR PARACENTESIS  06/21/2017  . IR PARACENTESIS  07/26/2017  . IR PARACENTESIS  08/17/2017  . IR PARACENTESIS  09/03/2017  . IR PARACENTESIS  04/08/2018  .  IR PARACENTESIS  04/24/2018  . IR PARACENTESIS  04/25/2018  . IR PARACENTESIS  06/19/2018  . LAMINECTOMY     x2  . Stress cardiolite  01/29/06  . 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 TONSILLECTOMY    . TOTAL KNEE ARTHROPLASTY     bilateral  . TRANSTHORACIC ECHOCARDIOGRAM  02/08/06     Current Outpatient Medications  Medication Sig Dispense Refill  . allopurinol (ZYLOPRIM) 300 MG tablet Take 300 mg by mouth daily.    Marland Kitchen amiodarone (PACERONE) 200 MG tablet Take 1 tablet (200 mg total) by mouth 2 (two) times daily. 60 tablet 3  . apixaban (ELIQUIS) 5 MG  TABS tablet TAKE ONE TABLET BY MOUTH 2 TIMES A DAY (Patient taking differently: Take 5 mg by mouth 2 (two) times daily. ) 180 tablet 3  . BIOTIN PO Take 1 tablet by mouth daily.    . bumetanide (BUMEX) 1 MG tablet Take 3 tablets (3 mg total) by mouth 2 (two) times daily for 30 days. 180 tablet 0  . diclofenac sodium (VOLTAREN) 1 % GEL Apply 2 g topically 4 (four) times daily. 100 g 0  . DIGOX 125 MCG tablet Take 1 tablet (125 mcg total) by mouth daily. 30 tablet 5  . HYDROcodone-acetaminophen (NORCO/VICODIN) 5-325 MG tablet Take 1 tablet by mouth 3 (three) times daily as needed for up to 12 doses for moderate pain. 12 tablet 0  . iron polysaccharides (NIFEREX) 150 MG capsule Take 1 capsule (150 mg total) by mouth daily. 30 capsule 0  . lactobacillus acidophilus (BACID) TABS tablet Take 2 tablets by mouth daily.    Marland Kitchen lactulose (CHRONULAC) 10 GM/15ML solution Take 30 mLs (20 g total) by mouth daily as needed for up to 8 doses for severe constipation. 236 mL 0  . LORazepam (ATIVAN) 0.5 MG tablet Take 0.5 mg by mouth every 8 (eight) hours as needed for anxiety.    . metolazone (ZAROXOLYN) 5 MG tablet Take 1 tablet (5 mg total) by mouth as directed. 20 tablet 2  . ondansetron (ZOFRAN) 8 MG tablet Take 8 mg by mouth every 8 (eight) hours as needed for nausea or vomiting.    . polyethylene glycol (MIRALAX / GLYCOLAX) packet Take 17 g by mouth daily as needed for moderate constipation. 14 each 0  . potassium chloride SA (K-DUR,KLOR-CON) 20 MEQ tablet Take 2 tablets (40 mEq total) by mouth daily. Take extra 2 tabs when you take Metolazone 90 tablet 1  . prochlorperazine (COMPAZINE) 10 MG tablet Take 10 mg by mouth every 6 (six) hours as needed for nausea or vomiting.    . senna-docusate (SENOKOT-S) 8.6-50 MG tablet Take 1 tablet by mouth 2 (two) times daily. 60 tablet 0  . sertraline (ZOLOFT) 100 MG tablet Take 200 mg by mouth daily.     Marland Kitchen spironolactone (ALDACTONE) 25 MG tablet Take 1 tablet (25 mg total)  by mouth daily. 60 tablet 6  . terbinafine (LAMISIL) 250 MG tablet Take 250 mg by mouth daily.     No current facility-administered medications for this encounter.     Allergies:   Patient has no known allergies.   Social History:  The patient  reports that he has never smoked. He has never used smokeless tobacco. He reports that he does not drink alcohol or use drugs.   Family History:  The patient's family history includes Arrhythmia in his mother.   ROS:  Please see  the history of present illness.   All other systems are personally reviewed and negative.   Exam:  (Video/Tele Health Call; Exam is subjective and or/visual.) General:  No resp difficulty on phone Reports diffuse LE edema and ab distension  Neuro: Alert & oriented x 3.   Recent Labs: 04/25/2018: B Natriuretic Peptide 1,544.3; TSH 8.246 05/08/2018: Magnesium 2.2 05/14/2018: ALT 22 05/21/2018: BUN 26; Creatinine, Ser 0.96; Hemoglobin 9.5; Platelets 226; Potassium 4.1; Sodium 132    Wt Readings from Last 3 Encounters:  05/21/18 84.2 kg (185 lb 9.6 oz)  05/15/18 84.1 kg (185 lb 4.8 oz)  04/25/18 101.6 kg (224 lb)      Other studies personally reviewed:   ASSESSMENT AND PLAN:  1. 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 RV severely dilated and mild RV dysfunction - Echo 2/19. Normal LVEF with significant RV failure - Echo 10/2017 LVEF read as 35-40%, but over-read by Dr. Haroldine Laws to be ~50-55%. RV down. Stable per Dr. Haroldine Laws - Echo 04/25/18 EF 55-60% RV markedly dilated moderate HK. Septal flattening  severe TR - Recent prolonged admission for severe RV failure requiring milrinone. Diuresed 40 pounds. D/c weight 2/20 was 185 - Weight back up 30 pounds with severe RV failure. He is reluctant to be readmitted in setting of COVID crisis - Will continue metolazone 5 mg daily + KCL 40 extra. Await lab results - Stressed need to limit fluid  intake.  - Will have [facility] repeat labs next week - If not responding or getting worse will need admission  - Has severe TR. Doubt he is candidate for experimental TV clip with comorbidities but will d/w structural team.  - F/u call next week  2. Recurrent PAF - S/p multiple ablations and MAZE.  - Maintaining NSR - Continue 200 bid - Continue Eliquis. Tolerating well. No bleeding.  3. CLL - Per Onocology - Now s/p pelvic XRT  - Seems much improved after stopping Vanclexta  4. CAD and CABG x 1 - No s/s of ischemia   COVID screen The patient does not have any symptoms that suggest any further testing/ screening at this time.  Social distancing reinforced today.  Recommended follow-up:  Friday telehealth visit  Relevant cardiac medications were reviewed at length with the patient today.   The patient does not have concerns regarding their medications at this time.   The following changes were made today:  See above  Labs/ tests ordered today include: see above  Patient Risk: After full review of this patients clinical status, I feel that they are at moderate risk for cardiac decompensation at this time.  Today, I have spent 12 minutes with the patient with telehealth technology discussing the above .    Signed, Glori Bickers, MD  07/12/2018 12:53 PM  [facility] [address] Heart and [place] [address] [phone] (office) [phone] (fax)

## 2018-07-12 NOTE — Progress Notes (Signed)
AVS sent to pt via mychart, f/u call scheduled for [facility] 4/13

## 2018-07-15 ENCOUNTER — Ambulatory Visit (HOSPITAL_COMMUNITY)
Admission: RE | Admit: 2018-07-15 | Discharge: 2018-07-15 | Disposition: A | Discharge status: Home / Self Care | Payer: Medicare Other | Source: Ambulatory Visit | Attending: Internal Medicine | Admitting: Internal Medicine

## 2018-07-15 ENCOUNTER — Telehealth (HOSPITAL_COMMUNITY): Payer: Self-pay | Admitting: *Deleted

## 2018-07-15 ENCOUNTER — Other Ambulatory Visit: Payer: Self-pay

## 2018-07-15 DIAGNOSIS — I5033 Acute on chronic diastolic (congestive) heart failure: Secondary | ICD-10-CM

## 2018-07-15 DIAGNOSIS — I480 Paroxysmal atrial fibrillation: Secondary | ICD-10-CM | POA: Diagnosis not present

## 2018-07-15 DIAGNOSIS — I110 Hypertensive heart disease with heart failure: Secondary | ICD-10-CM | POA: Diagnosis not present

## 2018-07-15 DIAGNOSIS — I50810 Right heart failure, unspecified: Secondary | ICD-10-CM

## 2018-07-15 MED ORDER — POTASSIUM CHLORIDE CRYS ER 20 MEQ PO TBCR: 60.0000 meq | EXTENDED_RELEASE_TABLET | Freq: Every day | ORAL | 1 refills | Status: DC

## 2018-07-15 NOTE — Progress Notes (Signed)
Heart Failure TeleHealth Note  Due to national recommendations of social distancing due to [place] 19, Audio/video telehealth visit is felt to be most appropriate for this patient at this time.  See MyChart message from today for patient consent regarding telehealth for [facility].  Date:  07/15/2018   ID:  Antonio Carrillo, DOB 10/09/1940, MRN [id number]  Location: Home  Provider location: [place] Type of Visit: Established patient  PCP:  Patient, No Pcp Per  Cardiologist:  No primary care provider on file. Primary HF: DB  Chief Complaint:  HF   History of Present Illness: Antonio Carrillo is a 78 y.o. male who presents via audio/video conferencing for a telehealth visit today.     Antonio Carrillo is a 78 y.o. male  with h/o CLL s/p chemo and XRT at [facility], aortic root aneurysm, s/p aortic root replacement with CABG 1, PAF s/p several ablations, cardiac cirrhosis and severe RV failure  He has ascites requiring frequent paracentesis.  Had prolonged admission 1.23-2/12/20 for severe RV failure and AKI. Initial co-ox 29%. Was on milrinone 0.5 and high-dose IV lasix. Diuresed about 40 pounds. D/c weight 185 pounds. Hospitalization also c/b LLE hematoma. PAF treated with IV amio Milrinone weaned off. Co-ox on d/c was 59%. Echo 04/25/18 EF 55-60% RV markedly dilated moderate HK. Septal flattening  severe TR  I spoke to him during a telehealth visit 2x last week. Weight up 30 pounds. Reluctant to come in to hospital due to Covid. Started metolazone 5mg  daily.   Says he feels continues to feel better. Weight down another 5 pounds (10 pounds total). Now at 205 (baseline about 185) LE edema and ab bloating improved. Wrapping legs with ACE wraps. No orthopnea or PND. Still weak but feels he can get around better as fluid is coming off. No bleeding with Eliquis. K 3.4  He denies symptoms of cough, fevers, chills, or new SOB worrisome for COVID 19.    Past Medical  History:  Diagnosis Date  . Anticoagulants causing adverse effect in therapeutic use   . Aortic aneurysm [facility])    s/p surgery at [facility]  . HTN (hypertension)   . Hyperlipidemia   . Hyperthyroidism    resolved per pt  . 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 Persistent atrial fibrillation    s/p ablations x3  . Testicular failure    Past Surgical History:  Procedure Laterality Date  . APPENDECTOMY    . ATRIAL ABLATION SURGERY  1. late 1990's  2. 02/01/06  3. 10/10   1. In [place]  2. At [facility]  3. Redo by Greggory Brandy [facility])  . BACK SURGERY    . CARDIOVERSION N/A 09/16/2012   Procedure: CARDIOVERSION;  Surgeon: Jolaine Artist, MD;  Location: [facility] ENDOSCOPY;  Service: Cardiovascular;  Laterality: N/A;  angie will call back /heather [phone]  . CARDIOVERSION N/A 07/15/2015   Procedure: CARDIOVERSION;  Surgeon: Herminio Commons, MD;  Location: [place];  Service: Cardiovascular;  Laterality: N/A;  . CARDIOVERSION N/A 12/24/2015   Procedure: CARDIOVERSION;  Surgeon: Sueanne Margarita, MD;  Location: [place];  Service: Cardiovascular;  Laterality: N/A;  . IR PARACENTESIS  04/13/2017  . IR PARACENTESIS  04/25/2017  . IR PARACENTESIS  05/18/2017  . IR PARACENTESIS  06/21/2017  . IR PARACENTESIS  07/26/2017  . IR PARACENTESIS  08/17/2017  . IR PARACENTESIS  09/03/2017  . IR PARACENTESIS  04/08/2018  . IR PARACENTESIS  04/24/2018  . IR PARACENTESIS  04/25/2018  . IR PARACENTESIS  06/19/2018  . LAMINECTOMY     x2  . Stress cardiolite  01/29/06  . 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 TONSILLECTOMY    . TOTAL KNEE ARTHROPLASTY     bilateral  . TRANSTHORACIC ECHOCARDIOGRAM  02/08/06     Current Outpatient Medications  Medication Sig Dispense Refill  . allopurinol (ZYLOPRIM) 300 MG tablet Take 300 mg by mouth daily.    Marland Kitchen amiodarone (PACERONE) 200 MG tablet Take 1 tablet (200 mg total) by mouth 2 (two) times daily. 60 tablet 3  . apixaban (ELIQUIS) 5 MG  TABS tablet TAKE ONE TABLET BY MOUTH 2 TIMES A DAY (Patient taking differently: Take 5 mg by mouth 2 (two) times daily. ) 180 tablet 3  . BIOTIN PO Take 1 tablet by mouth daily.    . bumetanide (BUMEX) 1 MG tablet Take 3 tablets (3 mg total) by mouth 2 (two) times daily for 30 days. 180 tablet 0  . diclofenac sodium (VOLTAREN) 1 % GEL Apply 2 g topically 4 (four) times daily. 100 g 0  . DIGOX 125 MCG tablet Take 1 tablet (125 mcg total) by mouth daily. 30 tablet 5  . HYDROcodone-acetaminophen (NORCO/VICODIN) 5-325 MG tablet Take 1 tablet by mouth 3 (three) times daily as needed for up to 12 doses for moderate pain. 12 tablet 0  . iron polysaccharides (NIFEREX) 150 MG capsule Take 1 capsule (150 mg total) by mouth daily. 30 capsule 0  . lactobacillus acidophilus (BACID) TABS tablet Take 2 tablets by mouth daily.    Marland Kitchen lactulose (CHRONULAC) 10 GM/15ML solution Take 30 mLs (20 g total) by mouth daily as needed for up to 8 doses for severe constipation. 236 mL 0  . LORazepam (ATIVAN) 0.5 MG tablet Take 0.5 mg by mouth every 8 (eight) hours as needed for anxiety.    . metolazone (ZAROXOLYN) 5 MG tablet Take 1 tablet (5 mg total) by mouth as directed. 20 tablet 2  . ondansetron (ZOFRAN) 8 MG tablet Take 8 mg by mouth every 8 (eight) hours as needed for nausea or vomiting.    . polyethylene glycol (MIRALAX / GLYCOLAX) packet Take 17 g by mouth daily as needed for moderate constipation. 14 each 0  . potassium chloride SA (K-DUR,KLOR-CON) 20 MEQ tablet Take 3 tablets (60 mEq total) by mouth daily. Take extra 2 tabs when you take Metolazone 120 tablet 1  . prochlorperazine (COMPAZINE) 10 MG tablet Take 10 mg by mouth every 6 (six) hours as needed for nausea or vomiting.    . senna-docusate (SENOKOT-S) 8.6-50 MG tablet Take 1 tablet by mouth 2 (two) times daily. 60 tablet 0  . sertraline (ZOLOFT) 100 MG tablet Take 200 mg by mouth daily.     Marland Kitchen spironolactone (ALDACTONE) 25 MG tablet Take 1 tablet (25 mg total)  by mouth daily. 60 tablet 6  . terbinafine (LAMISIL) 250 MG tablet Take 250 mg by mouth daily.     No current facility-administered medications for this encounter.     Allergies:   Patient has no known allergies.   Social History:  The patient  reports that he has never smoked. He has never used smokeless tobacco. He reports that he does not drink alcohol or use drugs.   Family History:  The patient's family history includes Arrhythmia in his mother.   ROS:  Please see the history of  present illness.   All other systems are personally reviewed and negative.   Exam:  (Video/Tele Health Call; Exam is subjective and or/visual.) General:  No resp difficulty on phone. Speaks in full sentences  Moderate LE edema per report with healing wounds Neuro: Alert & oriented x 3.   Recent Labs: 04/25/2018: B Natriuretic Peptide 1,544.3; TSH 8.246 05/08/2018: Magnesium 2.2 05/14/2018: ALT 22 05/21/2018: BUN 26; Creatinine, Ser 0.96; Hemoglobin 9.5; Platelets 226; Potassium 4.1; Sodium 132    Wt Readings from Last 3 Encounters:  05/21/18 84.2 kg (185 lb 9.6 oz)  05/15/18 84.1 kg (185 lb 4.8 oz)  04/25/18 101.6 kg (224 lb)      Other studies personally reviewed:   ASSESSMENT AND PLAN:  1. 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 RV severely dilated and mild RV dysfunction - Echo 2/19. Normal LVEF with significant RV failure - Echo 10/2017 LVEF read as 35-40%, but over-read by Dr. Haroldine Laws to be ~50-55%. RV down. Stable per Dr. Haroldine Laws - Echo 04/25/18 EF 55-60% RV markedly dilated moderate HK. Septal flattening  severe TR - Recent prolonged admission for severe RV failure requiring milrinone. Diuresed 40 pounds. D/c weight 2/20 was 185 - Weight back up 30 pounds with severe RV failure. He is reluctant to be readmitted in setting of COVID crisis - Now down 10 pounds. - Will continue metolazone 5 mg daily + KCL 40 extra.  Repeat labs later in the week with [facility]. - Stressed need to limit fluid intake.  - If not responding or getting worse will need admission  - Has severe TR. Doubt he is candidate for experimental TV clip with comorbidities but will d/w structural team.  - F/u call next week  2. Hypokalemia - K 3.4 on labs - Potassium increased to 28meq daily  3. Recurrent PAF - S/p multiple ablations and MAZE.  - Maintaining NSR - Continue 200 bid - Continue Eliquis. Tolerating well.No bleeding..  4. CLL - Per Onocology - Now s/p pelvic XRT  - Seems much improved after stopping Vanclexta  5. CAD and CABG x 1 - No s/s of ischemia   COVID screen The patient does not have any symptoms that suggest any further testing/ screening at this time.  Social distancing reinforced today.  Recommended follow-up:  Thursday telehealth visit  Relevant cardiac medications were reviewed at length with the patient today.   The patient does not have concerns regarding their medications at this time.   The following changes were made today:  See above  Labs/ tests ordered today include: see above  Patient Risk: After full review of this patients clinical status, I feel that they are at moderate risk for cardiac decompensation at this time.  Today, I have spent 12 minutes with the patient with telehealth technology discussing the above .    Signed, Glori Bickers, MD  07/15/2018 6:14 PM  [facility] [address] [place] [address] [phone] (office) [phone] (fax)

## 2018-07-15 NOTE — Telephone Encounter (Signed)
Labs came via fax Amy Clegg,NP reviewed them. Potassium low at 3.4 K increased to 25meq daily. Pt aware and agreeable with plan.

## 2018-07-16 NOTE — Addendum Note (Signed)
Encounter addended by: Scarlette Calico, RN on: 07/16/2018 9:55 AM  Actions taken: Clinical Note Signed

## 2018-07-16 NOTE — Progress Notes (Signed)
Antonio Carrillo, spoke w/Melissa clinical manager she states they have a sch visit with pt on Thur 4/16 and they will do labs at that visit.  Telehealth appt sch for 4/16 with Dr Haroldine Laws

## 2018-07-18 ENCOUNTER — Other Ambulatory Visit: Payer: Self-pay

## 2018-07-18 ENCOUNTER — Ambulatory Visit (HOSPITAL_COMMUNITY)
Admission: RE | Admit: 2018-07-18 | Discharge: 2018-07-18 | Disposition: A | Discharge status: Home / Self Care | Payer: Medicare Other | Source: Ambulatory Visit | Attending: Internal Medicine | Admitting: Internal Medicine

## 2018-07-23 ENCOUNTER — Other Ambulatory Visit (HOSPITAL_COMMUNITY): Payer: Self-pay | Admitting: Internal Medicine

## 2018-07-24 ENCOUNTER — Other Ambulatory Visit (HOSPITAL_COMMUNITY): Payer: Self-pay | Admitting: Internal Medicine

## 2018-07-25 ENCOUNTER — Telehealth (HOSPITAL_COMMUNITY): Payer: Self-pay | Admitting: Cardiology

## 2018-07-25 NOTE — Telephone Encounter (Signed)
vo given for continued PT with Corrinne Eagle [phone]

## 2018-07-28 ENCOUNTER — Other Ambulatory Visit (HOSPITAL_COMMUNITY): Payer: Self-pay | Admitting: Internal Medicine

## 2018-08-09 ENCOUNTER — Other Ambulatory Visit (HOSPITAL_COMMUNITY): Payer: Self-pay | Admitting: Internal Medicine

## 2018-08-15 ENCOUNTER — Telehealth (HOSPITAL_COMMUNITY): Payer: Self-pay | Admitting: *Deleted

## 2018-08-15 DIAGNOSIS — R188 Other ascites: Secondary | ICD-10-CM

## 2018-08-15 NOTE — Telephone Encounter (Signed)
Pt called requesting a paracentesis says hes developed a lot of fluid in his abdomen. Pt wants to know if Dr.Bensimhon is willing to order this for him.  Message routed to [place] for advice

## 2018-08-15 NOTE — Telephone Encounter (Signed)
Will have to coordinate this with heather as covid19 precautions have changed the way we schedule.  [name] please advise and I can schedule just not sure if covid19 testing is needed before scheduling.

## 2018-08-15 NOTE — Telephone Encounter (Signed)
Ok to order 

## 2018-08-16 NOTE — Telephone Encounter (Signed)
Per central scheduling covid test is not required for paracentesis.  Order placed for para and sch for Wed 5/20 at 9am, pt is aware

## 2018-08-21 ENCOUNTER — Other Ambulatory Visit: Payer: Self-pay

## 2018-08-21 ENCOUNTER — Ambulatory Visit (HOSPITAL_COMMUNITY)
Admission: RE | Admit: 2018-08-21 | Discharge: 2018-08-21 | Disposition: A | Discharge status: Home / Self Care | Payer: Medicare Other | Source: Ambulatory Visit | Attending: Internal Medicine | Admitting: Internal Medicine

## 2018-08-21 ENCOUNTER — Encounter (HOSPITAL_COMMUNITY): Payer: Self-pay | Admitting: Student

## 2018-08-21 DIAGNOSIS — R188 Other ascites: Secondary | ICD-10-CM | POA: Insufficient documentation

## 2018-08-21 HISTORY — PX: IR PARACENTESIS: IMG2679

## 2018-08-21 MED ORDER — LIDOCAINE HCL 1 % IJ SOLN
INTRAMUSCULAR | Status: AC
  Filled 2018-08-21: qty 20

## 2018-08-21 NOTE — Procedures (Signed)
PROCEDURE SUMMARY:  Successful image-guided paracentesis from the left lower abdomen.  Yielded 5.1 liters of clear yellow fluid.  No immediate complications.  EBL = 0 mL. Patient tolerated well.   Specimen was not sent for labs.  Claris Pong Louk PA-C 08/21/2018 9:34 AM

## 2018-08-22 ENCOUNTER — Other Ambulatory Visit (HOSPITAL_COMMUNITY): Payer: Self-pay | Admitting: Internal Medicine

## 2018-08-29 ENCOUNTER — Other Ambulatory Visit (HOSPITAL_COMMUNITY): Payer: Self-pay | Admitting: Internal Medicine

## 2018-09-12 ENCOUNTER — Telehealth (HOSPITAL_COMMUNITY): Payer: Self-pay | Admitting: *Deleted

## 2018-09-12 ENCOUNTER — Telehealth (HOSPITAL_COMMUNITY): Payer: Self-pay | Admitting: Cardiology

## 2018-09-12 DIAGNOSIS — R188 Other ascites: Secondary | ICD-10-CM

## 2018-09-12 NOTE — Telephone Encounter (Signed)
Patient called to report increased abdominal swelling, requests paracentesis and follow up with Dr Haroldine Laws after procedure to discuss symptoms as he has not been feeling well.   Message to Dr Haroldine Laws  Patient aware procedure will need pre procedure COVID testing

## 2018-09-12 NOTE — Telephone Encounter (Signed)
Paracentesis scheduled for 6/12 @ 2 -pt aware and patient does not need covid screening -add on virtual visit 6/12 @ 3

## 2018-09-12 NOTE — Telephone Encounter (Signed)
Arrange paracentesis and set up televisit for tomorrow afternoon please.

## 2018-09-12 NOTE — Telephone Encounter (Signed)
Home health orders faxed to [facility].

## 2018-09-13 ENCOUNTER — Ambulatory Visit (HOSPITAL_BASED_OUTPATIENT_CLINIC_OR_DEPARTMENT_OTHER)
Admission: RE | Admit: 2018-09-13 | Discharge: 2018-09-13 | Disposition: A | Discharge status: Home / Self Care | Payer: Medicare Other | Source: Ambulatory Visit | Attending: Internal Medicine | Admitting: Internal Medicine

## 2018-09-13 ENCOUNTER — Ambulatory Visit (HOSPITAL_COMMUNITY)
Admission: RE | Admit: 2018-09-13 | Discharge: 2018-09-13 | Disposition: A | Discharge status: Home / Self Care | Payer: Medicare Other | Source: Ambulatory Visit | Attending: Internal Medicine | Admitting: Internal Medicine

## 2018-09-13 ENCOUNTER — Encounter (HOSPITAL_COMMUNITY): Payer: Self-pay

## 2018-09-13 ENCOUNTER — Encounter (HOSPITAL_COMMUNITY): Payer: Self-pay | Admitting: Student

## 2018-09-13 ENCOUNTER — Other Ambulatory Visit: Payer: Self-pay

## 2018-09-13 DIAGNOSIS — R188 Other ascites: Secondary | ICD-10-CM | POA: Diagnosis present

## 2018-09-13 DIAGNOSIS — E785 Hyperlipidemia, unspecified: Secondary | ICD-10-CM | POA: Diagnosis not present

## 2018-09-13 DIAGNOSIS — I50810 Right heart failure, unspecified: Secondary | ICD-10-CM | POA: Diagnosis not present

## 2018-09-13 DIAGNOSIS — C9110 Chronic lymphocytic leukemia of B-cell type not having achieved remission: Secondary | ICD-10-CM | POA: Diagnosis not present

## 2018-09-13 DIAGNOSIS — Z951 Presence of aortocoronary bypass graft: Secondary | ICD-10-CM | POA: Diagnosis not present

## 2018-09-13 DIAGNOSIS — E876 Hypokalemia: Secondary | ICD-10-CM | POA: Insufficient documentation

## 2018-09-13 DIAGNOSIS — R296 Repeated falls: Secondary | ICD-10-CM | POA: Diagnosis not present

## 2018-09-13 DIAGNOSIS — I2510 Atherosclerotic heart disease of native coronary artery without angina pectoris: Secondary | ICD-10-CM | POA: Insufficient documentation

## 2018-09-13 DIAGNOSIS — Z79899 Other long term (current) drug therapy: Secondary | ICD-10-CM | POA: Diagnosis not present

## 2018-09-13 DIAGNOSIS — Z7901 Long term (current) use of anticoagulants: Secondary | ICD-10-CM | POA: Insufficient documentation

## 2018-09-13 DIAGNOSIS — I5084 End stage heart failure: Secondary | ICD-10-CM | POA: Insufficient documentation

## 2018-09-13 DIAGNOSIS — Z9221 Personal history of antineoplastic chemotherapy: Secondary | ICD-10-CM | POA: Diagnosis not present

## 2018-09-13 DIAGNOSIS — I480 Paroxysmal atrial fibrillation: Secondary | ICD-10-CM

## 2018-09-13 DIAGNOSIS — I110 Hypertensive heart disease with heart failure: Secondary | ICD-10-CM | POA: Diagnosis not present

## 2018-09-13 DIAGNOSIS — I5032 Chronic diastolic (congestive) heart failure: Secondary | ICD-10-CM | POA: Diagnosis not present

## 2018-09-13 DIAGNOSIS — I5022 Chronic systolic (congestive) heart failure: Secondary | ICD-10-CM

## 2018-09-13 DIAGNOSIS — I0981 Rheumatic heart failure: Secondary | ICD-10-CM

## 2018-09-13 HISTORY — PX: IR PARACENTESIS: IMG2679

## 2018-09-13 MED ORDER — LIDOCAINE HCL 1 % IJ SOLN
INTRAMUSCULAR | Status: AC
  Filled 2018-09-13: qty 20

## 2018-09-13 MED ORDER — LIDOCAINE HCL 1 % IJ SOLN
INTRAMUSCULAR | Status: DC | PRN
  Administered 2018-09-13: 10 mL

## 2018-09-13 NOTE — Procedures (Signed)
PROCEDURE SUMMARY:  Successful US guided paracentesis from left lateral abdomen.  Yielded 5.0 liters of yellow fluid.  No immediate complications.  Pt tolerated well.   Specimen was not sent for labs.  EBL < 4mL  Docia Barrier PA-C 09/13/2018 2:51 PM

## 2018-09-13 NOTE — Progress Notes (Signed)
Heart Failure TeleHealth Note  Due to national recommendations of social distancing due to [place] 19, Audio/video telehealth visit is felt to be most appropriate for this patient at this time.  See MyChart message from today for patient consent regarding telehealth for [facility].  Date:  09/13/2018   ID:  Antonio Carrillo, DOB 02-07-1941, MRN [id number]  Location: Home  Provider location: [place] Type of Visit: Established patient  PCP:  Patient, No Pcp Per  Cardiologist:  No primary care provider on file. Primary HF: DB  Chief Complaint:  HF   History of Present Illness: Antonio Carrillo is a 78 y.o. male who presents via audio/video conferencing for a telehealth visit today.     Antonio Carrillo is a 78 y.o. male  with h/o CLL s/p chemo and XRT at [facility], aortic root aneurysm, s/p aortic root replacement with CABG 1, PAF s/p several ablations, cardiac cirrhosis and severe RV failure  He has ascites requiring frequent paracentesis.  Had prolonged admission 1.23-2/12/20 for severe RV failure and AKI. Initial co-ox 29%. Was on milrinone 0.5 and high-dose IV lasix. Diuresed about 40 pounds. D/c weight 185 pounds. Hospitalization also c/b LLE hematoma. PAF treated with IV amio Milrinone weaned off. Co-ox on d/c was 59%. Echo 04/25/18 EF 55-60% RV markedly dilated moderate HK. Septal flattening  severe TR  He presents today for telehealth visit due to [place] pandemic. Has been struggling lately with increased weakness, LE edema and abdominal swelling. Has had numerous falls with skin tears. Has trouble standing. Using walker. No bleeding despite being on Eliquis. Had 5.0 L paracentesis earlier today. Says he feels much better. Says leg edema has nearly resolved.  LLE wound still draining some. Has HHRN & HHRT coming 2x/week and also going to [facility] center in [place]. Has not weighed himself recently.   He denies symptoms of cough, fevers, chills, or new SOB  worrisome for COVID 19.    Past Medical History:  Diagnosis Date  . Anticoagulants causing adverse effect in therapeutic use   . Aortic aneurysm [facility])    s/p surgery at [facility]  . HTN (hypertension)   . Hyperlipidemia   . Hyperthyroidism    resolved per pt  . 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 Persistent atrial fibrillation    s/p ablations x3  . Testicular failure    Past Surgical History:  Procedure Laterality Date  . APPENDECTOMY    . ATRIAL ABLATION SURGERY  1. late 1990's  2. 02/01/06  3. 10/10   1. In [place]  2. At [facility]  3. Redo by Greggory Brandy [facility])  . BACK SURGERY    . CARDIOVERSION N/A 09/16/2012   Procedure: CARDIOVERSION;  Surgeon: Jolaine Artist, MD;  Location: [facility] ENDOSCOPY;  Service: Cardiovascular;  Laterality: N/A;  angie will call back /heather [phone]  . CARDIOVERSION N/A 07/15/2015   Procedure: CARDIOVERSION;  Surgeon: Herminio Commons, MD;  Location: [place];  Service: Cardiovascular;  Laterality: N/A;  . CARDIOVERSION N/A 12/24/2015   Procedure: CARDIOVERSION;  Surgeon: Sueanne Margarita, MD;  Location: [place];  Service: Cardiovascular;  Laterality: N/A;  . IR PARACENTESIS  04/13/2017  . IR PARACENTESIS  04/25/2017  . IR PARACENTESIS  05/18/2017  . IR PARACENTESIS  06/21/2017  . IR PARACENTESIS  07/26/2017  . IR PARACENTESIS  08/17/2017  . IR PARACENTESIS  09/03/2017  . IR PARACENTESIS  04/08/2018  . IR PARACENTESIS  04/24/2018  .  IR PARACENTESIS  04/25/2018  . IR PARACENTESIS  06/19/2018  . IR PARACENTESIS  08/21/2018  . IR PARACENTESIS  09/13/2018  . LAMINECTOMY     x2  . Stress cardiolite  01/29/06  . 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 TONSILLECTOMY    . TOTAL KNEE ARTHROPLASTY     bilateral  . TRANSTHORACIC ECHOCARDIOGRAM  02/08/06     Current Outpatient Medications  Medication Sig Dispense Refill  . allopurinol (ZYLOPRIM) 300 MG tablet Take 300 mg by mouth daily.    Marland Kitchen amiodarone (PACERONE) 200 MG  tablet Take 1 tablet (200 mg total) by mouth 2 (two) times daily. 60 tablet 3  . apixaban (ELIQUIS) 5 MG TABS tablet TAKE ONE TABLET BY MOUTH 2 TIMES A DAY (Patient taking differently: Take 5 mg by mouth 2 (two) times daily. ) 180 tablet 3  . BIOTIN PO Take 1 tablet by mouth daily.    . bumetanide (BUMEX) 1 MG tablet Take 3 tablets (3 mg total) by mouth 2 (two) times daily for 30 days. 180 tablet 0  . bumetanide (BUMEX) 2 MG tablet Take 1.5 tablets (3 mg total) by mouth 2 (two) times daily. 90 tablet 6  . diclofenac sodium (VOLTAREN) 1 % GEL Apply 2 g topically 4 (four) times daily. 100 g 0  . DIGOX 125 MCG tablet Take 1 tablet (125 mcg total) by mouth daily. 30 tablet 5  . HYDROcodone-acetaminophen (NORCO/VICODIN) 5-325 MG tablet Take 1 tablet by mouth 3 (three) times daily as needed for up to 12 doses for moderate pain. 12 tablet 0  . iron polysaccharides (NIFEREX) 150 MG capsule Take 1 capsule (150 mg total) by mouth daily. 30 capsule 0  . lactobacillus acidophilus (BACID) TABS tablet Take 2 tablets by mouth daily.    Marland Kitchen lactulose (CHRONULAC) 10 GM/15ML solution Take 30 mLs (20 g total) by mouth daily as needed for up to 8 doses for severe constipation. 236 mL 0  . LORazepam (ATIVAN) 0.5 MG tablet Take 0.5 mg by mouth every 8 (eight) hours as needed for anxiety.    . metolazone (ZAROXOLYN) 5 MG tablet Take 1 tablet (5 mg total) by mouth as directed. 20 tablet 2  . ondansetron (ZOFRAN) 8 MG tablet Take 8 mg by mouth every 8 (eight) hours as needed for nausea or vomiting.    . polyethylene glycol (MIRALAX / GLYCOLAX) packet Take 17 g by mouth daily as needed for moderate constipation. 14 each 0  . potassium chloride SA (K-DUR,KLOR-CON) 20 MEQ tablet Take 3 tablets (60 mEq total) by mouth daily. Take extra 2 tabs when you take Metolazone 120 tablet 1  . prochlorperazine (COMPAZINE) 10 MG tablet Take 10 mg by mouth every 6 (six) hours as needed for nausea or vomiting.    . senna-docusate (SENOKOT-S)  8.6-50 MG tablet Take 1 tablet by mouth 2 (two) times daily. 60 tablet 0  . sertraline (ZOLOFT) 100 MG tablet Take 200 mg by mouth daily.     Marland Kitchen spironolactone (ALDACTONE) 25 MG tablet Take 1 tablet (25 mg total) by mouth daily. 60 tablet 6  . terbinafine (LAMISIL) 250 MG tablet Take 250 mg by mouth daily.     No current facility-administered medications for this encounter.    Facility-Administered Medications Ordered in Other Encounters  Medication Dose Route Frequency Provider Last Rate Last Dose  . lidocaine (XYLOCAINE) 1 % (with pres) injection           .  lidocaine (XYLOCAINE) 1 % (with pres) injection   Infiltration PRN Docia Barrier, PA   10 mL at 09/13/18 1447    Allergies:   Patient has no known allergies.   Social History:  The patient  reports that he has never smoked. He has never used smokeless tobacco. He reports that he does not drink alcohol or use drugs.   Family History:  The patient's family history includes Arrhythmia in his mother.   ROS:  Please see the history of present illness.   All other systems are personally reviewed and negative.   Exam:  (Video/Tele Health Call; Exam is subjective and or/visual.) General:  No resp difficulty on phone. Speaks in full sentences  + Ab distension  Leg with mild edema and skin tears Neuro: Alert & oriented x 3.   Recent Labs: 04/25/2018: B Natriuretic Peptide 1,544.3; TSH 8.246 05/08/2018: Magnesium 2.2 05/14/2018: ALT 22 05/21/2018: BUN 26; Creatinine, Ser 0.96; Hemoglobin 9.5; Platelets 226; Potassium 4.1; Sodium 132    Wt Readings from Last 3 Encounters:  05/21/18 84.2 kg (185 lb 9.6 oz)  05/15/18 84.1 kg (185 lb 4.8 oz)  04/25/18 101.6 kg (224 lb)    Other studies personally reviewed:   ASSESSMENT AND PLAN:  1. 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 RV severely dilated and mild RV dysfunction - Echo 2/19. Normal LVEF with  significant RV failure - Echo 10/2017 LVEF read as 35-40%, but over-read by Dr. Haroldine Laws to be ~50-55%. RV down. Stable per Dr. Haroldine Laws - Echo 04/25/18 EF 55-60% RV markedly dilated moderate HK. Septal flattening  severe TR - Had prolonged admission 1/20 for severe RV failure requiring milrinone. Diuresed 40 pounds. D/c weight 2/20 was 185 - No struggling with end-stage RV failure and progressive weakness, falls and edema.  - Had paracenetesis today. Feels better and refusing further evaluation for now. I raised the concept of SNF but he refuses.  - Continue torsemide. He is off metolazone but may need to restart.  - Has severe TR. Doubt he is candidate for experimental TV clip with comorbidities but will d/w structural team.  - We will schedule a f/u call next week  2. Hypokalemia - Will repeat labs next week   3. Recurrent PAF - S/p multiple ablations and MAZE.  - Maintaining NSR - Continue 200 bid - Will stop Eliquis with multiple falls  4. CLL - Per Onocology - Now s/p pelvic XRT  - Seems much improved after stopping Vanclexta  5. CAD and CABG x 1 - No s/s of ischemia   COVID screen The patient does not have any symptoms that suggest any further testing/ screening at this time.  Social distancing reinforced today.  Recommended follow-up:  Thursday telehealth visit  Relevant cardiac medications were reviewed at length with the patient today.   The patient does not have concerns regarding their medications at this time.   The following changes were made today:  See above  Labs/ tests ordered today include: see above  Patient Risk: After full review of this patients clinical status, I feel that they are at moderate risk for cardiac decompensation at this time.  Today, I have spent 22 minutes with the patient with telehealth technology discussing the above .    Signed, Glori Bickers, MD  09/13/2018 4:16 PM  [facility] [address] Heart  [place] [address] [phone] (office) [phone] (fax)

## 2018-09-14 ENCOUNTER — Telehealth (HOSPITAL_COMMUNITY): Payer: Medicare Other | Admitting: Internal Medicine

## 2018-09-18 ENCOUNTER — Ambulatory Visit (HOSPITAL_COMMUNITY)
Admission: RE | Admit: 2018-09-18 | Discharge: 2018-09-18 | Disposition: A | Discharge status: Home / Self Care | Payer: Medicare Other | Source: Ambulatory Visit | Attending: Internal Medicine | Admitting: Internal Medicine

## 2018-09-18 ENCOUNTER — Other Ambulatory Visit: Payer: Self-pay

## 2018-09-18 DIAGNOSIS — R188 Other ascites: Secondary | ICD-10-CM

## 2018-09-18 DIAGNOSIS — I50810 Right heart failure, unspecified: Secondary | ICD-10-CM

## 2018-09-18 DIAGNOSIS — I5032 Chronic diastolic (congestive) heart failure: Secondary | ICD-10-CM

## 2018-09-18 DIAGNOSIS — I480 Paroxysmal atrial fibrillation: Secondary | ICD-10-CM

## 2018-09-18 MED ORDER — POTASSIUM CHLORIDE CRYS ER 20 MEQ PO TBCR: 60.0000 meq | EXTENDED_RELEASE_TABLET | Freq: Every day | ORAL | 6 refills | Status: AC

## 2018-09-18 MED ORDER — METOLAZONE 5 MG PO TABS: 5.0000 mg | ORAL_TABLET | ORAL | 2 refills | Status: AC

## 2018-09-18 NOTE — Patient Instructions (Addendum)
ADD metolazone 5mg  Monday, Wednesday and Friday starting today  ADD extra Potassium 19meq (2 tabs) Monday, Wednesday and Friday starting today  Labs EVERY Monday x4 weeks with [facility] care  We will only contact you if something comes back abnormal or we need to make some changes. Otherwise no news is good news!  Your physician recommends that you schedule a follow-up appointment in: 2 weeks televisit with Dr  Haroldine Laws  At the [facility], you and your health needs are our priority. As part of our continuing mission to provide you with exceptional heart care, we have created designated Provider Care Teams. These Care Teams include your primary Cardiologist (physician) and Advanced Practice Providers (APPs- Physician Assistants and Nurse Practitioners) who all work together to provide you with the care you need, when you need it.   You may see any of the following providers on your designated Care Team at your next follow up: Marland Kitchen Dr Glori Bickers . Dr Loralie Champagne . Darrick Grinder, NP

## 2018-09-18 NOTE — Addendum Note (Signed)
Encounter addended by: Valeda Malm, RN on: 09/18/2018 11:37 AM  Actions taken: Pharmacy for encounter modified, Order list changed, Clinical Note Signed

## 2018-09-18 NOTE — Progress Notes (Signed)
Spoke with Melissa of [facility] care, verbal order given for BMET weekly on mondays x4 weeks. Discussed AVS instructions with patient and wife. Questions answered. Verbalized understanding. AVS mailed  AVS: ADD metolazone 5mg  Monday, Wednesday and Friday starting today  ADD extra Potassium 46meq (2 tabs) Monday, Wednesday and Friday starting today  Labs EVERY Monday x4 weeks with [facility] care  We will only contact you if something comes back abnormal or we need to make some changes. Otherwise no news is good news!  Your physician recommends that you schedule a follow-up appointment in: 2 weeks televisit with Dr.  Haroldine Laws

## 2018-09-18 NOTE — Progress Notes (Signed)
Heart Failure TeleHealth Note  Due to national recommendations of social distancing due to [place] 19, Audio/video telehealth visit is felt to be most appropriate for this patient at this time.  See MyChart message from today for patient consent regarding telehealth for [facility].  Date:  09/18/2018   ID:  Antonio Carrillo, DOB 1940/07/23, MRN [id number]  Location: Home  Provider location: [place] Type of Visit: Established patient  PCP:  Patient, No Pcp Per  Cardiologist:  No primary care provider on file. Primary HF: DB  Chief Complaint:  HF   History of Present Illness: Antonio Carrillo is a 78 y.o. male who presents via audio/video conferencing for a telehealth visit today.     Antonio Carrillo is a 78 y.o. male  with h/o CLL s/p chemo and XRT at [facility], aortic root aneurysm, s/p aortic root replacement with CABG 1, PAF s/p several ablations, cardiac cirrhosis and severe RV failure  He has ascites requiring frequent paracentesis.  Had prolonged admission 1.23-2/12/20 for severe RV failure and AKI. Initial co-ox 29%. Was on milrinone 0.5 and high-dose IV lasix. Diuresed about 40 pounds. D/c weight 185 pounds. Hospitalization also c/b LLE hematoma. PAF treated with IV amio Milrinone weaned off. Co-ox on d/c was 59%. Echo 04/25/18 EF 55-60% RV markedly dilated moderate HK. Septal flattening  severe TR  Had 5.0L paracentesis last week and telehealth visit. I was concerned about recurrent falls and low output RV failure. Refused admission   He presents today for telehealth visit in setting of COVID pandemic. Says he feels ok. Still volume overloaded. Weight up to 215. Breathing. Legs and belly swollen. Has HHRN & HHRT coming 2x/week and also going to Wound Care center in [place].  Very thirsty.  He denies symptoms of cough, fevers, chills, or new SOB worrisome for COVID 19.    Past Medical History:  Diagnosis Date  . Anticoagulants causing adverse  effect in therapeutic use   . Aortic aneurysm [facility])    s/p surgery at [facility]  . HTN (hypertension)   . Hyperlipidemia   . Hyperthyroidism    resolved per pt  . 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 Persistent atrial fibrillation    s/p ablations x3  . Testicular failure    Past Surgical History:  Procedure Laterality Date  . APPENDECTOMY    . ATRIAL ABLATION SURGERY  1. late 1990's  2. 02/01/06  3. 10/10   1. In [place]  2. At [facility]  3. Redo by Greggory Brandy [facility])  . BACK SURGERY    . CARDIOVERSION N/A 09/16/2012   Procedure: CARDIOVERSION;  Surgeon: Jolaine Artist, MD;  Location: [facility] ENDOSCOPY;  Service: Cardiovascular;  Laterality: N/A;  angie will call back /heather [phone]  . CARDIOVERSION N/A 07/15/2015   Procedure: CARDIOVERSION;  Surgeon: Herminio Commons, MD;  Location: [place];  Service: Cardiovascular;  Laterality: N/A;  . CARDIOVERSION N/A 12/24/2015   Procedure: CARDIOVERSION;  Surgeon: Sueanne Margarita, MD;  Location: [place];  Service: Cardiovascular;  Laterality: N/A;  . IR PARACENTESIS  04/13/2017  . IR PARACENTESIS  04/25/2017  . IR PARACENTESIS  05/18/2017  . IR PARACENTESIS  06/21/2017  . IR PARACENTESIS  07/26/2017  . IR PARACENTESIS  08/17/2017  . IR PARACENTESIS  09/03/2017  . IR PARACENTESIS  04/08/2018  . IR PARACENTESIS  04/24/2018  . IR PARACENTESIS  04/25/2018  . IR PARACENTESIS  06/19/2018  . IR PARACENTESIS  08/21/2018  . IR PARACENTESIS  09/13/2018  . LAMINECTOMY     x2  . Stress cardiolite  01/29/06  . 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 TONSILLECTOMY    . TOTAL KNEE ARTHROPLASTY     bilateral  . TRANSTHORACIC ECHOCARDIOGRAM  02/08/06     Current Outpatient Medications  Medication Sig Dispense Refill  . allopurinol (ZYLOPRIM) 300 MG tablet Take 300 mg by mouth daily.    Marland Kitchen amiodarone (PACERONE) 200 MG tablet Take 1 tablet (200 mg total) by mouth 2 (two) times daily. 60 tablet 3  . BIOTIN PO Take 1 tablet  by mouth daily.    . bumetanide (BUMEX) 1 MG tablet Take 3 tablets (3 mg total) by mouth 2 (two) times daily for 30 days. 180 tablet 0  . bumetanide (BUMEX) 2 MG tablet Take 1.5 tablets (3 mg total) by mouth 2 (two) times daily. 90 tablet 6  . diclofenac sodium (VOLTAREN) 1 % GEL Apply 2 g topically 4 (four) times daily. 100 g 0  . DIGOX 125 MCG tablet Take 1 tablet (125 mcg total) by mouth daily. 30 tablet 5  . HYDROcodone-acetaminophen (NORCO/VICODIN) 5-325 MG tablet Take 1 tablet by mouth 3 (three) times daily as needed for up to 12 doses for moderate pain. 12 tablet 0  . iron polysaccharides (NIFEREX) 150 MG capsule Take 1 capsule (150 mg total) by mouth daily. 30 capsule 0  . lactobacillus acidophilus (BACID) TABS tablet Take 2 tablets by mouth daily.    Marland Kitchen lactulose (CHRONULAC) 10 GM/15ML solution Take 30 mLs (20 g total) by mouth daily as needed for up to 8 doses for severe constipation. 236 mL 0  . LORazepam (ATIVAN) 0.5 MG tablet Take 0.5 mg by mouth every 8 (eight) hours as needed for anxiety.    . metolazone (ZAROXOLYN) 5 MG tablet Take 1 tablet (5 mg total) by mouth as directed. 20 tablet 2  . ondansetron (ZOFRAN) 8 MG tablet Take 8 mg by mouth every 8 (eight) hours as needed for nausea or vomiting.    . polyethylene glycol (MIRALAX / GLYCOLAX) packet Take 17 g by mouth daily as needed for moderate constipation. 14 each 0  . potassium chloride SA (K-DUR,KLOR-CON) 20 MEQ tablet Take 3 tablets (60 mEq total) by mouth daily. Take extra 2 tabs when you take Metolazone 120 tablet 1  . prochlorperazine (COMPAZINE) 10 MG tablet Take 10 mg by mouth every 6 (six) hours as needed for nausea or vomiting.    . senna-docusate (SENOKOT-S) 8.6-50 MG tablet Take 1 tablet by mouth 2 (two) times daily. 60 tablet 0  . sertraline (ZOLOFT) 100 MG tablet Take 200 mg by mouth daily.     Marland Kitchen spironolactone (ALDACTONE) 25 MG tablet Take 1 tablet (25 mg total) by mouth daily. 60 tablet 6  . terbinafine (LAMISIL)  250 MG tablet Take 250 mg by mouth daily.     No current facility-administered medications for this encounter.     Allergies:   Patient has no known allergies.   Social History:  The patient  reports that he has never smoked. He has never used smokeless tobacco. He reports that he does not drink alcohol or use drugs.   Family History:  The patient's family history includes Arrhythmia in his mother.   ROS:  Please see the history of present illness.   All other systems are personally reviewed and negative.   Exam:  (Video/Tele  Health Call; Exam is subjective and or/visual.) General:  No resp difficulty on phone. Speaks in full sentences  + ab distension Leg with edema and skin tears Neuro: Alert & oriented x 3.   Recent Labs: 04/25/2018: B Natriuretic Peptide 1,544.3; TSH 8.246 05/08/2018: Magnesium 2.2 05/14/2018: ALT 22 05/21/2018: BUN 26; Creatinine, Ser 0.96; Hemoglobin 9.5; Platelets 226; Potassium 4.1; Sodium 132    Wt Readings from Last 3 Encounters:  05/21/18 84.2 kg (185 lb 9.6 oz)  05/15/18 84.1 kg (185 lb 4.8 oz)  04/25/18 101.6 kg (224 lb)    Other studies personally reviewed:   ASSESSMENT AND PLAN:  1. 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 RV severely dilated and mild RV dysfunction - Echo 2/19. Normal LVEF with significant RV failure - Echo 10/2017 LVEF read as 35-40%, but over-read by Dr. Haroldine Laws to be ~50-55%. RV down. Stable per Dr. Haroldine Laws - Echo 04/25/18 EF 55-60% RV markedly dilated moderate HK. Septal flattening  severe TR - Had prolonged admission 1/20 for severe RV failure requiring milrinone. Diuresed 40 pounds. D/c weight 2/20 was 185 - Continues to struggle with end-stage RV failure and progressive weakness, falls and edema.  - Had paracenetesis last week. - Still 30 pounds elevated. Continue bumex 3mg  daily. Add metolazone 5mg   MWF + 40 kcl  - He refuses hospitalization  fur further treatment - Has severe TR. Doubt he is candidate for experimental TV clip with comorbidities but will d/w structural team.  - We will schedule a f/u call next week  2. Hypokalemia - Will repeat labs weekly with [facility]  3. Recurrent PAF - S/p multiple ablations and MAZE.  - Maintaining NSR - Continue amio 200 bid - Will stop Eliquis with multiple falls  4. CLL - Per Onocology - Now s/p pelvic XRT  - Seems much improved after stopping Vanclexta  5. CAD and CABG x 1 - No s/s of ischemia   COVID screen The patient does not have any symptoms that suggest any further testing/ screening at this time.  Social distancing reinforced today.  Recommended follow-up:  Thursday telehealth visit  Relevant cardiac medications were reviewed at length with the patient today.   The patient does not have concerns regarding their medications at this time.   The following changes were made today:  See above  Labs/ tests ordered today include: see above  Patient Risk: After full review of this patients clinical status, I feel that they are at moderate risk for cardiac decompensation at this time.  Today, I have spent 18 minutes with the patient with telehealth technology discussing the above .    Signed, Glori Bickers, MD  09/18/2018 11:00 AM  [facility] [address] Heart [place] [address] [phone] (office) [phone] (fax)

## 2018-09-20 ENCOUNTER — Telehealth (HOSPITAL_COMMUNITY): Payer: Self-pay

## 2018-09-20 NOTE — Telephone Encounter (Signed)
Received call that pt has expired. Death certificate to come for Dr. Haroldine Laws.

## 2018-09-24 ENCOUNTER — Telehealth (HOSPITAL_COMMUNITY): Payer: Self-pay

## 2018-09-24 NOTE — Telephone Encounter (Signed)
Signed Death Cert by DB left at front desk for pick up  Called [phone] Rodena Piety) for pick up

## 2018-10-02 DEATH — deceased

## 2018-10-03 ENCOUNTER — Telehealth (HOSPITAL_COMMUNITY): Payer: Medicare Other | Admitting: Internal Medicine

## 2019-11-25 IMAGING — US IR ABDOMEN US LIMITED
1 series · 4 of 4 positions shown · non-contrast
Comparison: Multiple previous ultrasound-guided paracenteses, most
recently on 09/03/2017

CLINICAL DATA: History of recurrent symptomatic ascites. Please
perform ascites search ultrasound ultrasound-guided paracentesis as
indicated.

EXAM:
LIMITED ABDOMEN ULTRASOUND FOR ASCITES
TECHNIQUE: Limited ultrasound survey for ascites was performed in all four
abdominal quadrants.

[Series 1: ir (id) (id)/(id)/(id) ir · 4 of 4 slices shown]
[im 1/4]
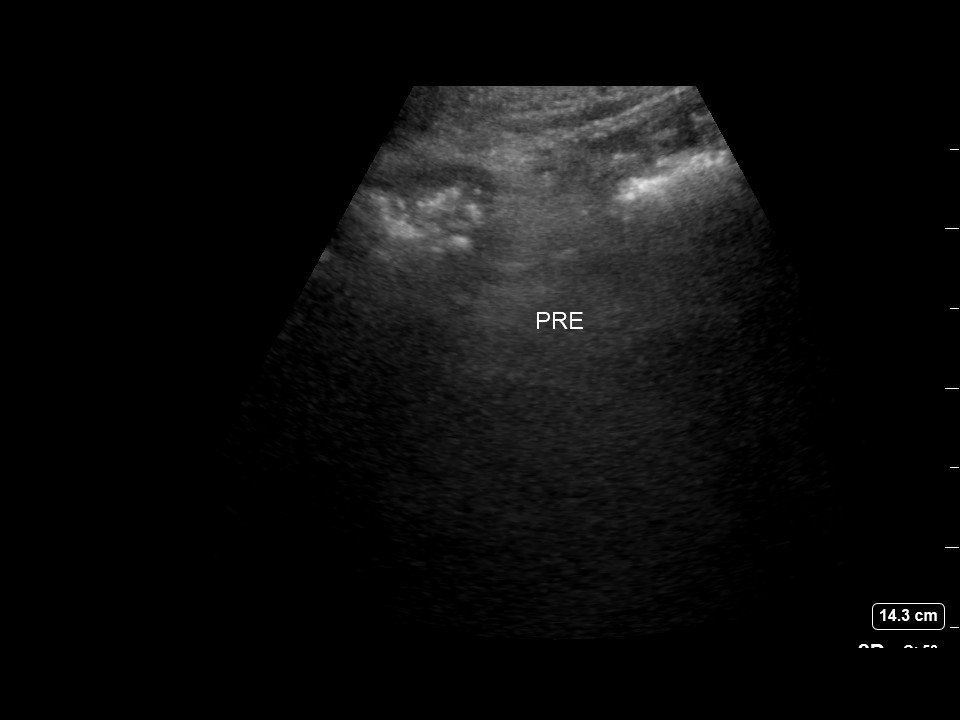
[im 2/4]
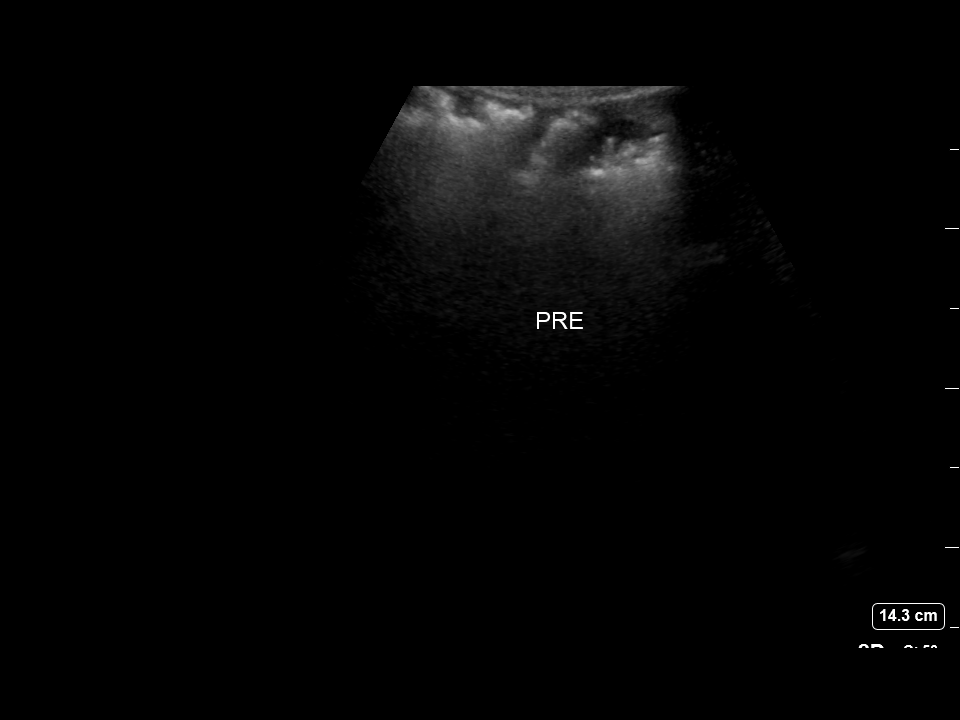
[im 3/4]
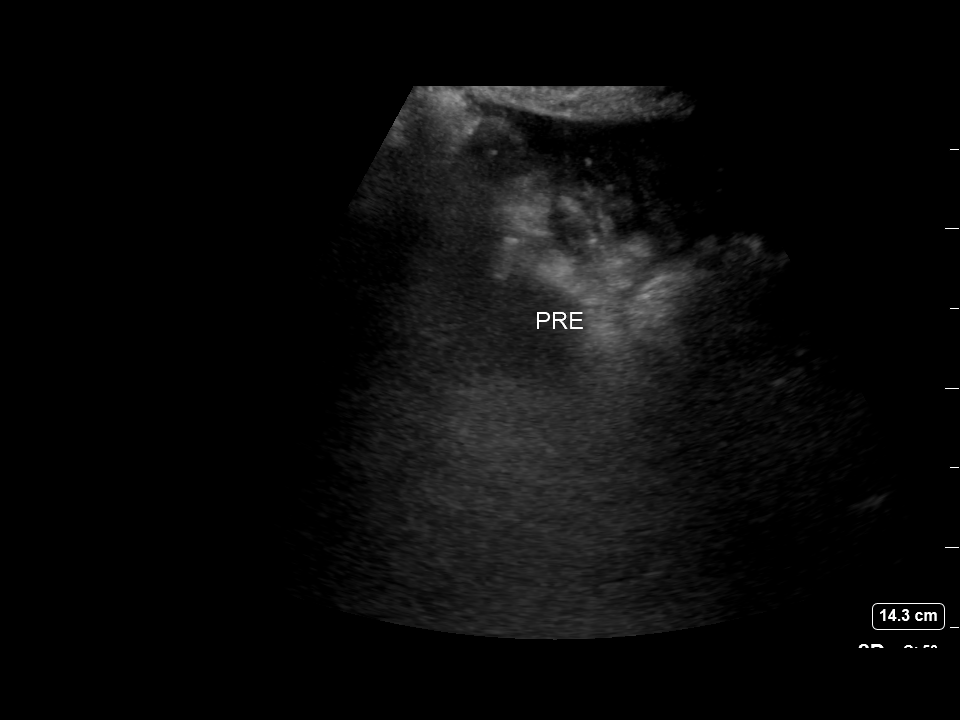
[im 4/4]
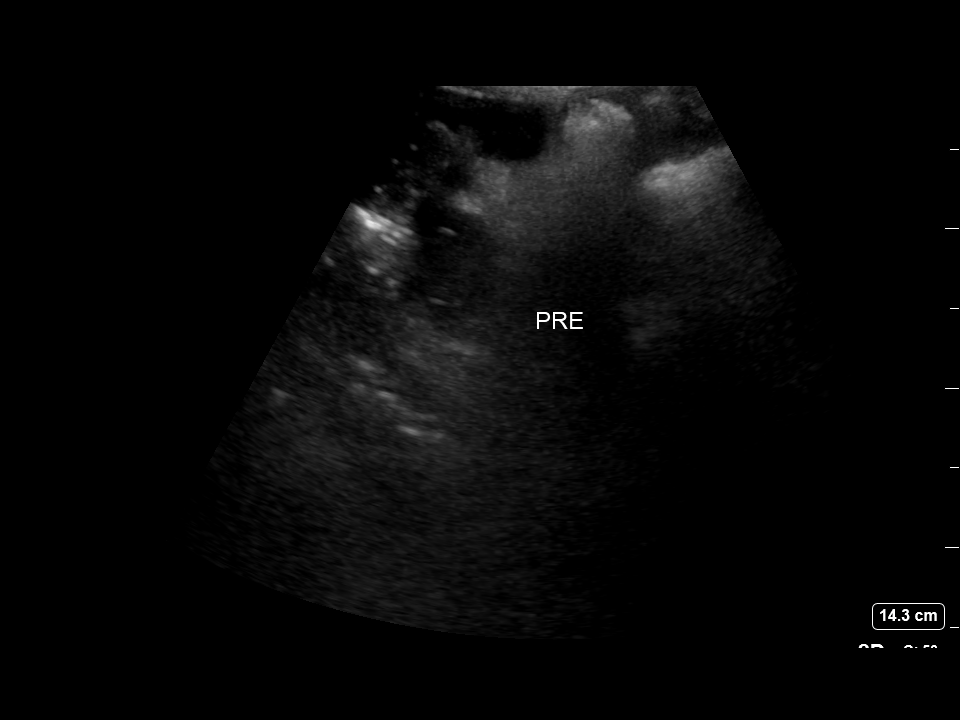

[4 of 4 positions shown; findings below may reference images not displayed]

FINDINGS: Sonographic evaluation of all 4 quadrants of the abdomen are
negative for any significant intra-abdominal fluid. As such,
paracentesis was not attempted.
IMPRESSION: No significant intra-abdominal ascites.  No paracentesis attempted.

## 2020-06-17 IMAGING — US IR PARACENTESIS
1 series · 2 of 2 positions shown · non-contrast
Comparison: none

INDICATION: History of CHF and lymphoma. Recurrent ascites. Request for
therapeutic paracentesis.

[Series 1: ir (id) (id)/(id)/(id) ir · 2 of 2 slices shown]
[im 1/2]
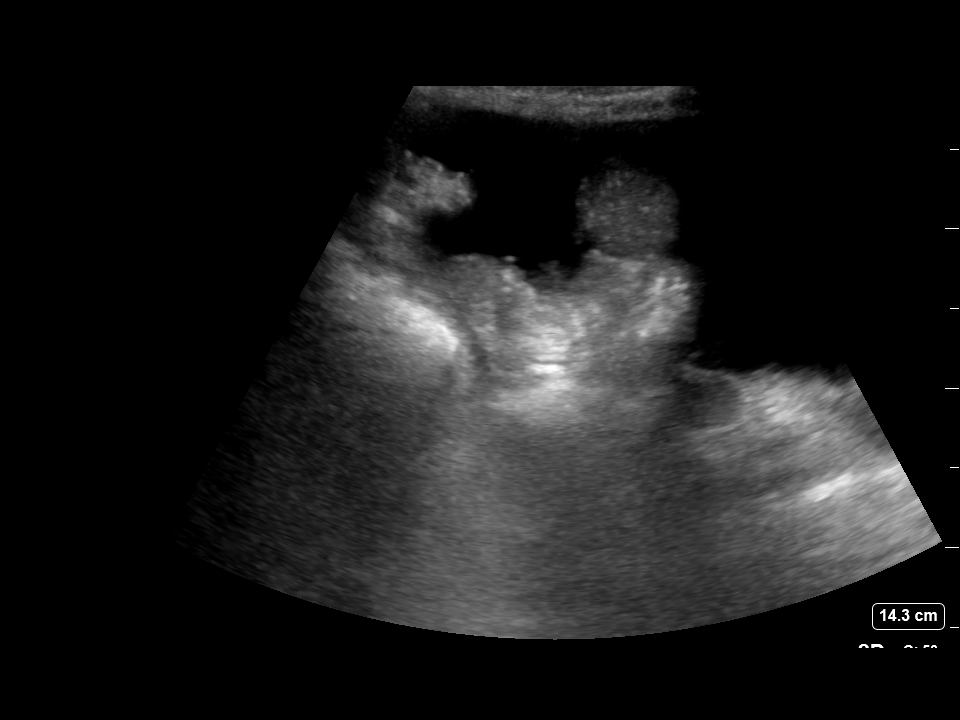
[im 2/2]
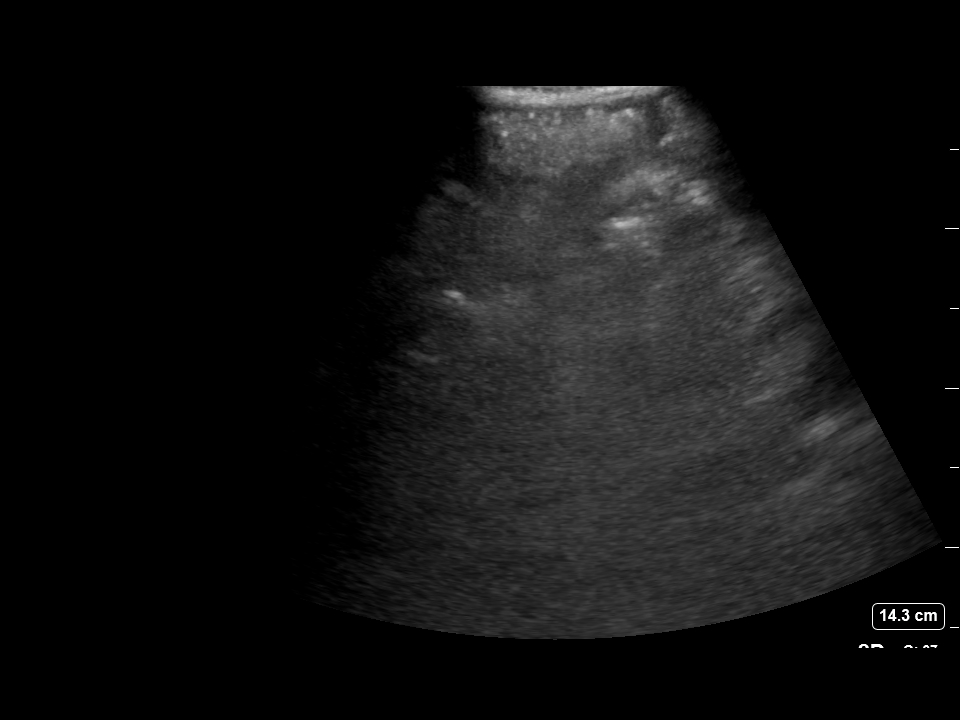

[2 of 2 positions shown; findings below may reference images not displayed]

EXAM:
ULTRASOUND GUIDED LEFT LOWER QUADRANT PARACENTESIS

MEDICATIONS:
None.

COMPLICATIONS:
None immediate.

PROCEDURE:
Informed written consent was obtained from the patient after a
discussion of the risks, benefits and alternatives to treatment. A
timeout was performed prior to the initiation of the procedure.

Initial ultrasound scanning demonstrates a large amount of ascites
within the left lower abdominal quadrant. The left lower abdomen was
prepped and draped in the usual sterile fashion. 1% lidocaine with
epinephrine was used for local anesthesia.

Following this, a 19 gauge, 7-cm, Yueh catheter was introduced. An
ultrasound image was saved for documentation purposes. The
paracentesis was performed. The catheter was removed and a dressing
was applied. The patient tolerated the procedure well without
immediate post procedural complication.
FINDINGS: A total of approximately 4.1 L of clear yellow fluid was removed.
IMPRESSION: Successful ultrasound-guided paracentesis yielding 4.1 liters of
peritoneal fluid.

## 2020-06-18 IMAGING — US IR PARACENTESIS
1 series · 9 of 9 positions shown · non-contrast
Comparison: none

INDICATION: Patient with history of CLL, atrial fibrillation severe right
ventricular heart failure and recurrent ascites. Patient underwent
paracentesis yesterday in IR with 4.1 L of clear yellow fluid
removed. He has since been admitted to the hospital due worsening
peripheral edema, lower extremity wound and dyspnea. Request for
repeat diagnostic and therapeutic paracentesis today in IR.

[Series 1: ir (id) (id)/(id)/(id) ir · 9 of 9 slices shown]
[im 1/9]
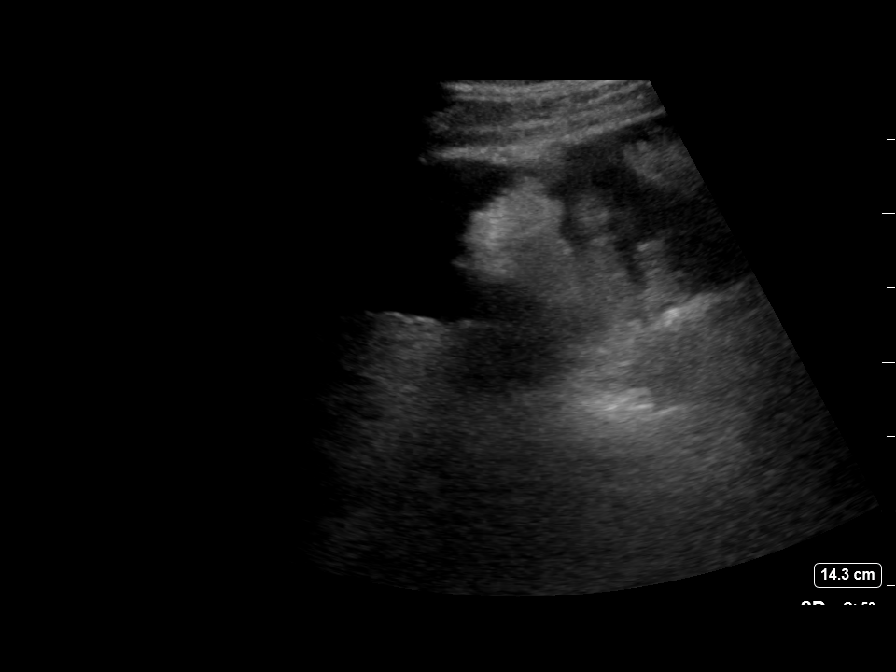
[im 2/9]
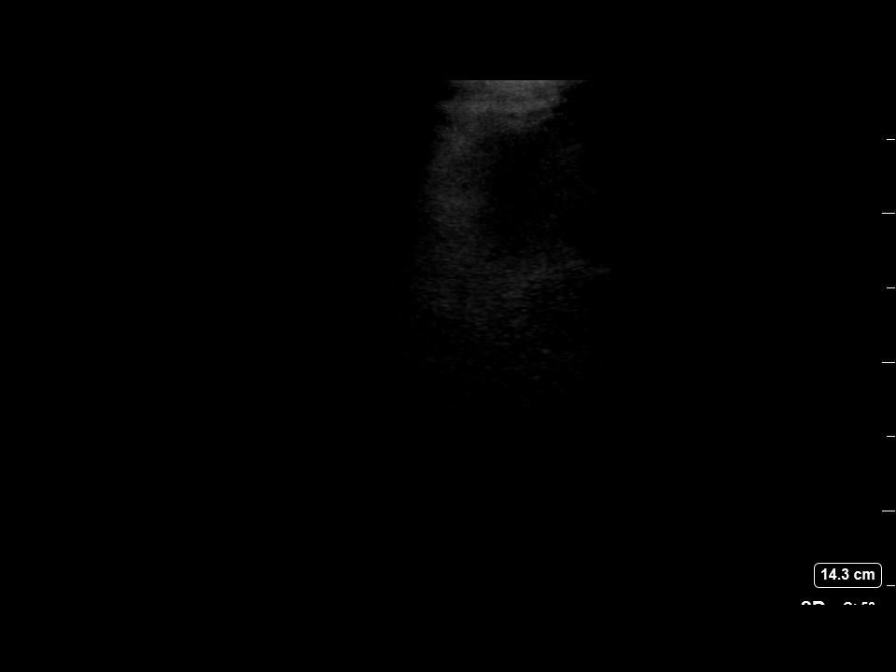
[im 3/9]
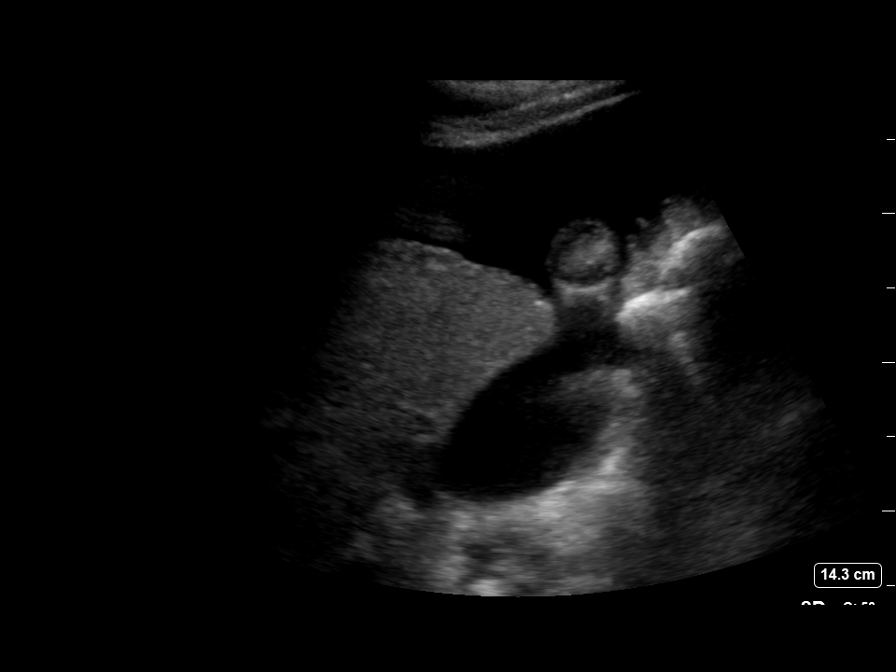
[im 4/9]
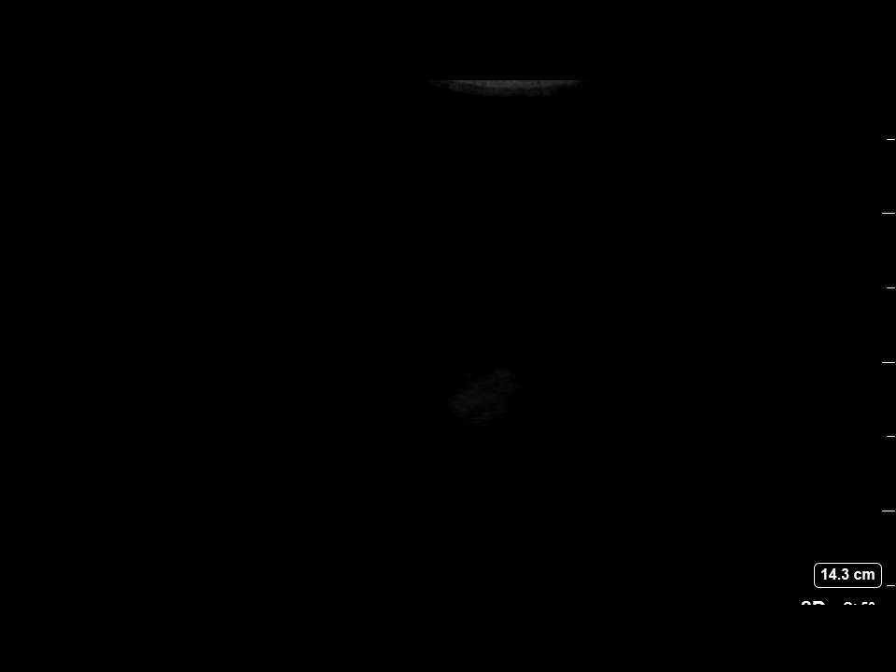
[im 5/9]
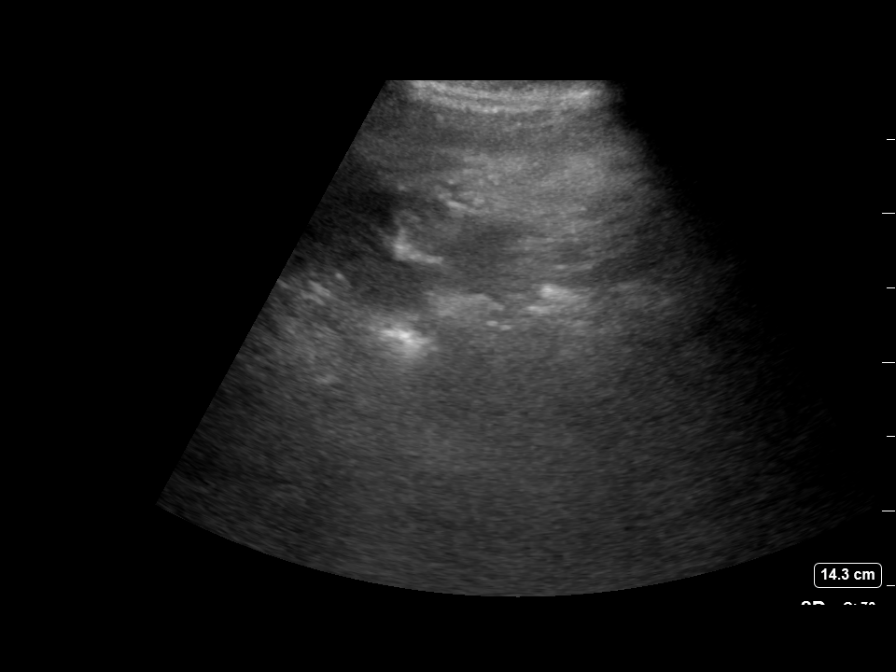
[im 6/9]
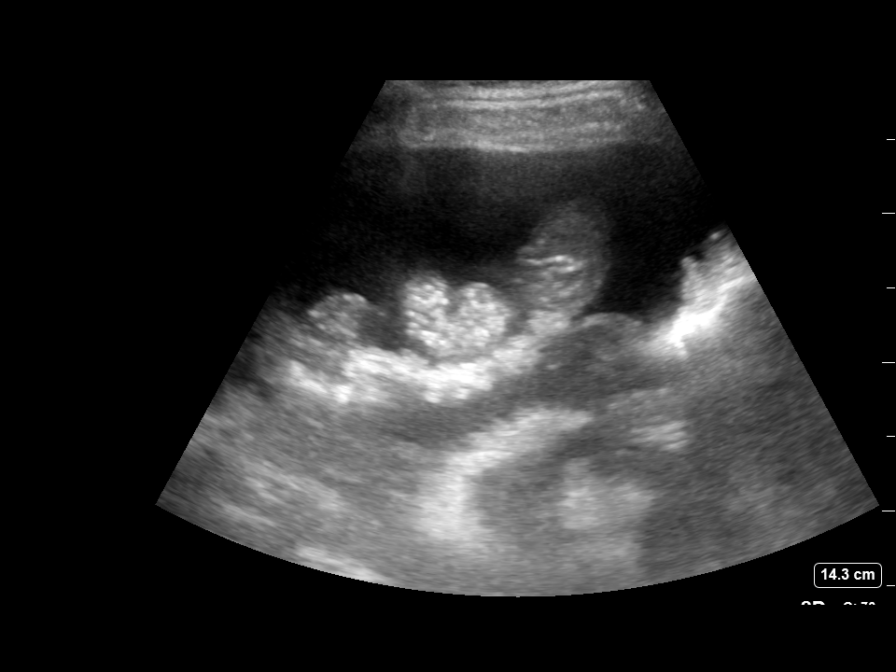
[im 7/9]
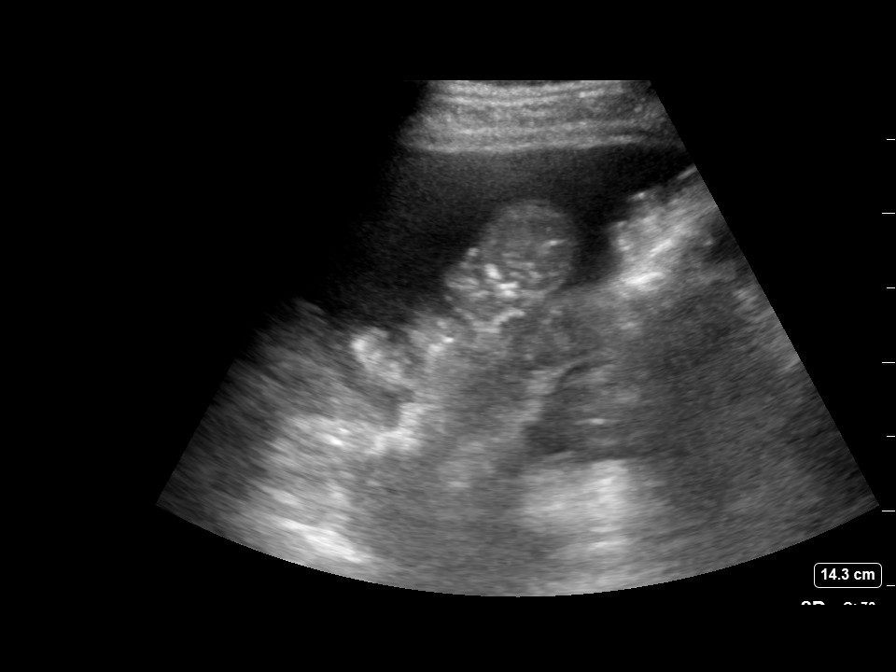
[im 8/9]
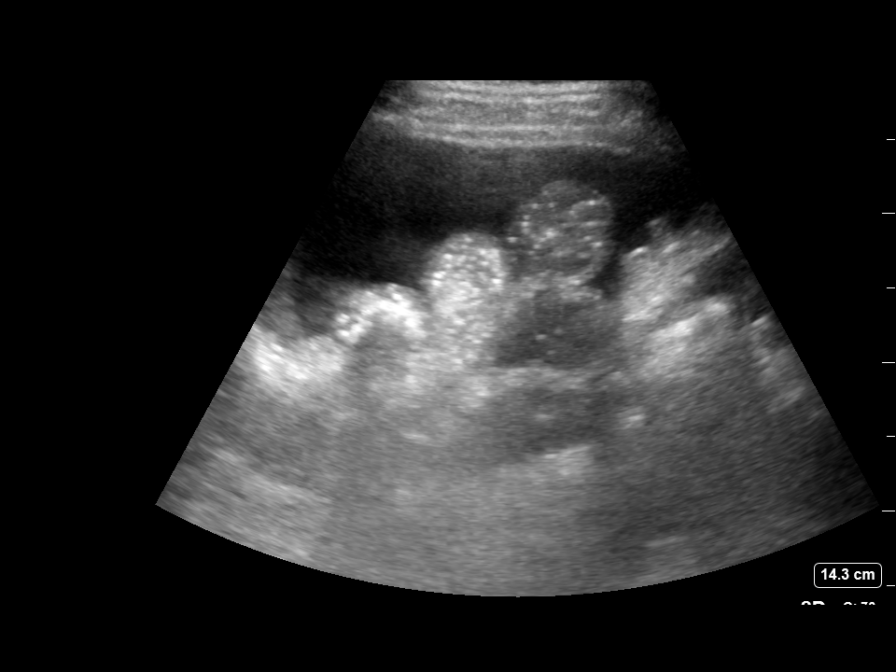
[im 9/9]
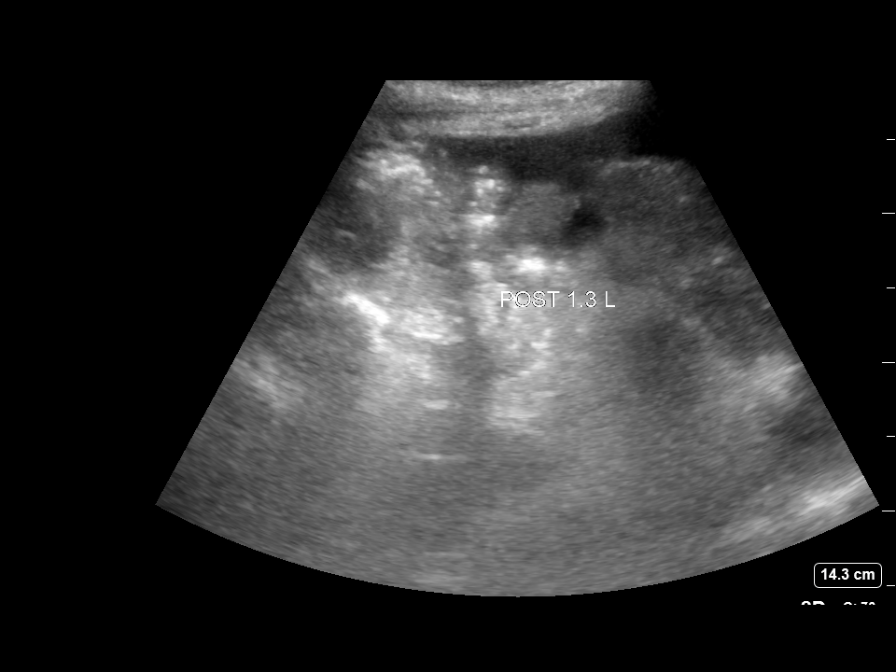

[9 of 9 positions shown; findings below may reference images not displayed]

EXAM:
ULTRASOUND GUIDED DIAGNOSTIC AND THERAPEUTIC PARACENTESIS

MEDICATIONS:
10 mL 1% lidocaine.

COMPLICATIONS:
None immediate.

PROCEDURE:
Informed written consent was obtained from the patient after a
discussion of the risks, benefits and alternatives to treatment. A
timeout was performed prior to the initiation of the procedure.

Initial ultrasound scanning demonstrates a moderate amount of
ascites within the left lower abdominal quadrant. The left lower
abdomen was prepped and draped in the usual sterile fashion. 1%
lidocaine was used for local anesthesia.

Following this, a 19 gauge, 7-cm, Yueh catheter was introduced. An
ultrasound image was saved for documentation purposes. The
paracentesis was performed. The catheter was removed and a dressing
was applied. The patient tolerated the procedure well without
immediate post procedural complication.
FINDINGS: A total of approximately 1.3 L of amber fluid was removed. Samples
were sent to the laboratory as requested by the clinical team.
IMPRESSION: Successful ultrasound-guided paracentesis yielding 1.3 liters of
peritoneal fluid.

Read by Trochez, Farha

## 2020-06-28 IMAGING — DX DG CHEST 1V PORT
1 series · 1 of 1 positions shown · non-contrast
Comparison: 04/25/2018

CLINICAL DATA: Bibasilar crackles

EXAM:
PORTABLE CHEST 1 VIEW

[chest]
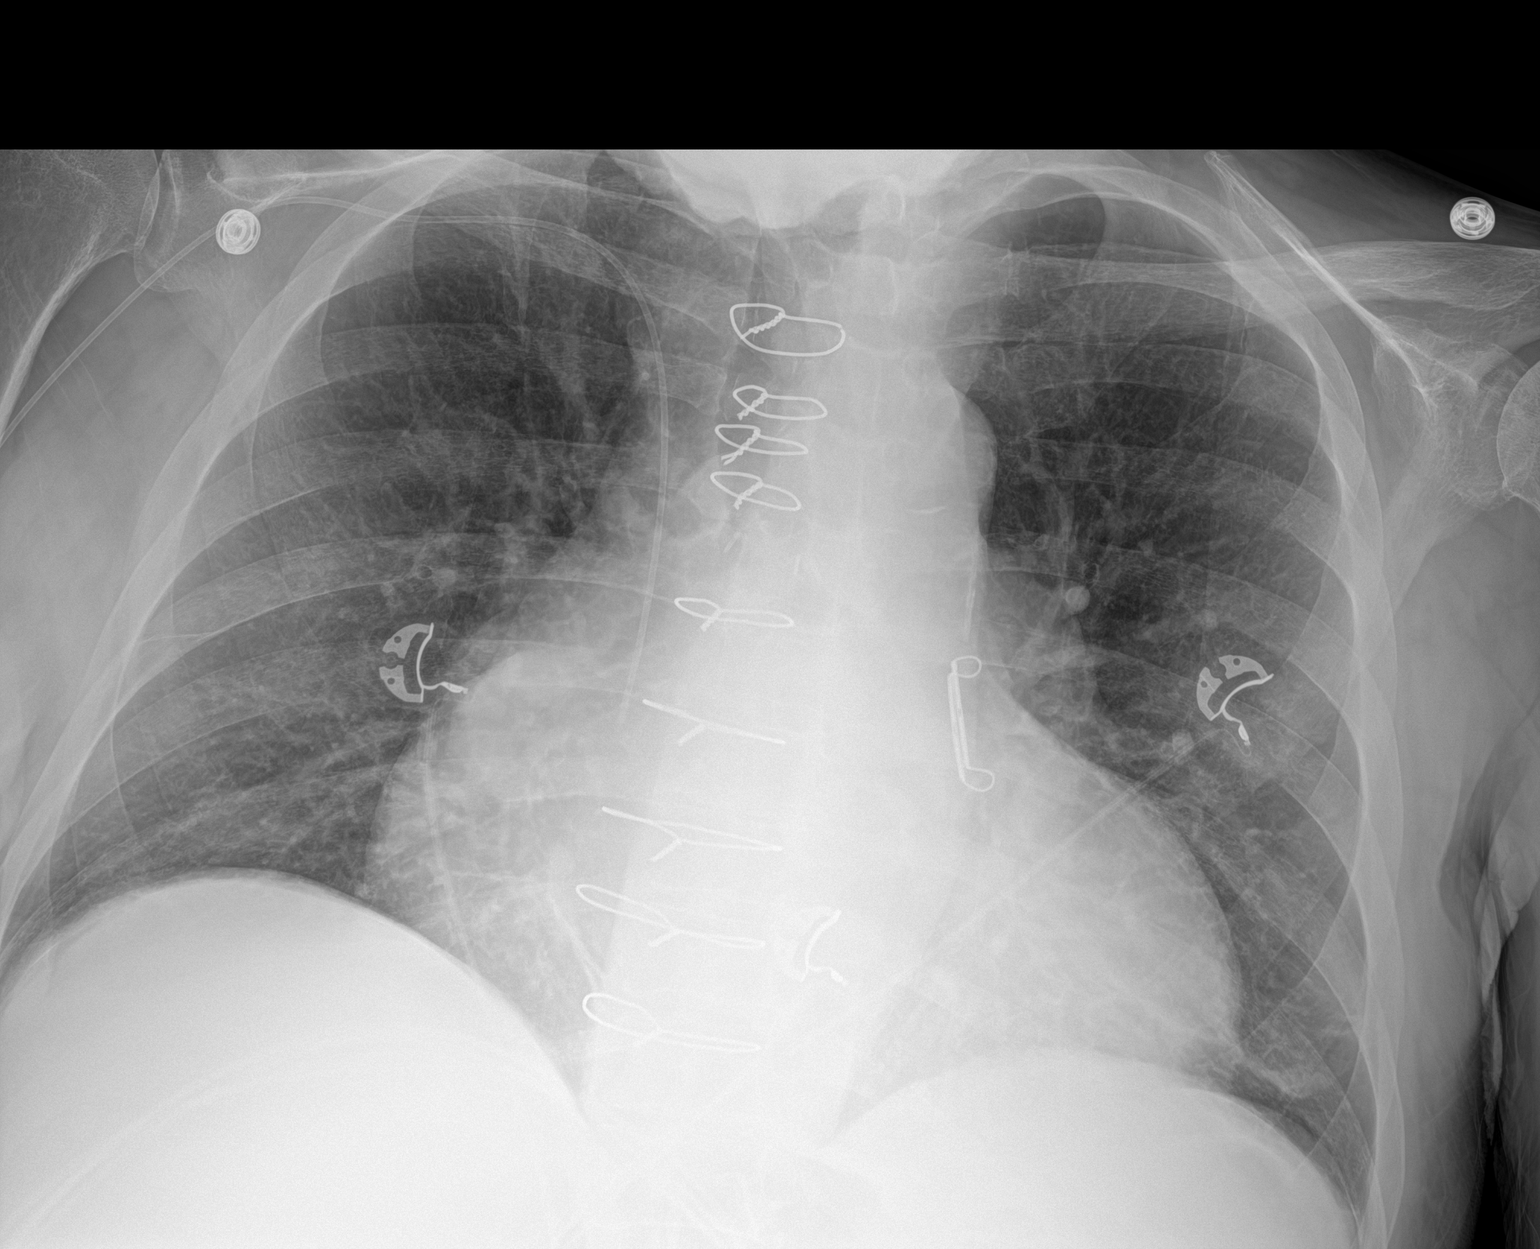

[1 of 1 positions shown; findings below may reference images not displayed]

FINDINGS: Stable right PICC. Stable atrial appendage stable. Moderate
cardiomegaly. Normal vascularity. Subsegmental atelectasis at the
lung bases. No pneumothorax or pleural effusion.
IMPRESSION: Cardiomegaly without decompensation.

## 2020-07-21 IMAGING — US IR ABDOMEN US LIMITED
1 series · 4 of 4 positions shown · non-contrast
Comparison: 04/25/2018

CLINICAL DATA: History of ascites and prior paracentesis on
04/25/2018.

EXAM:
LIMITED ABDOMEN ULTRASOUND FOR ASCITES
TECHNIQUE: Limited ultrasound survey for ascites was performed in all four
abdominal quadrants.

[Series 1: ir (id) (id)/(id)/(id) ir · 4 of 4 slices shown]
[im 1/4]
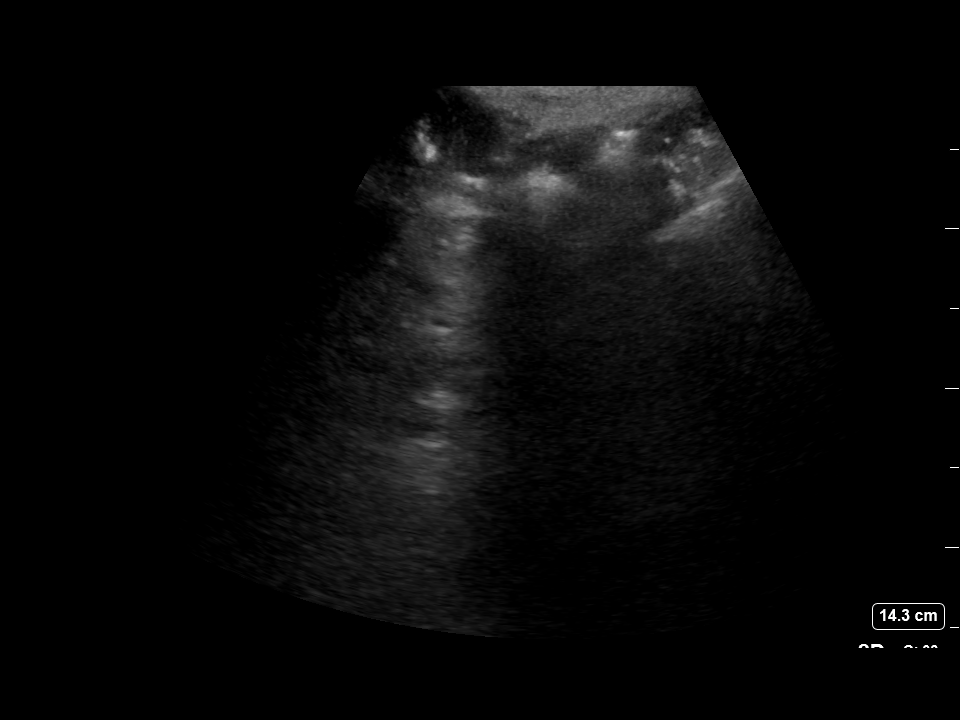
[im 2/4]
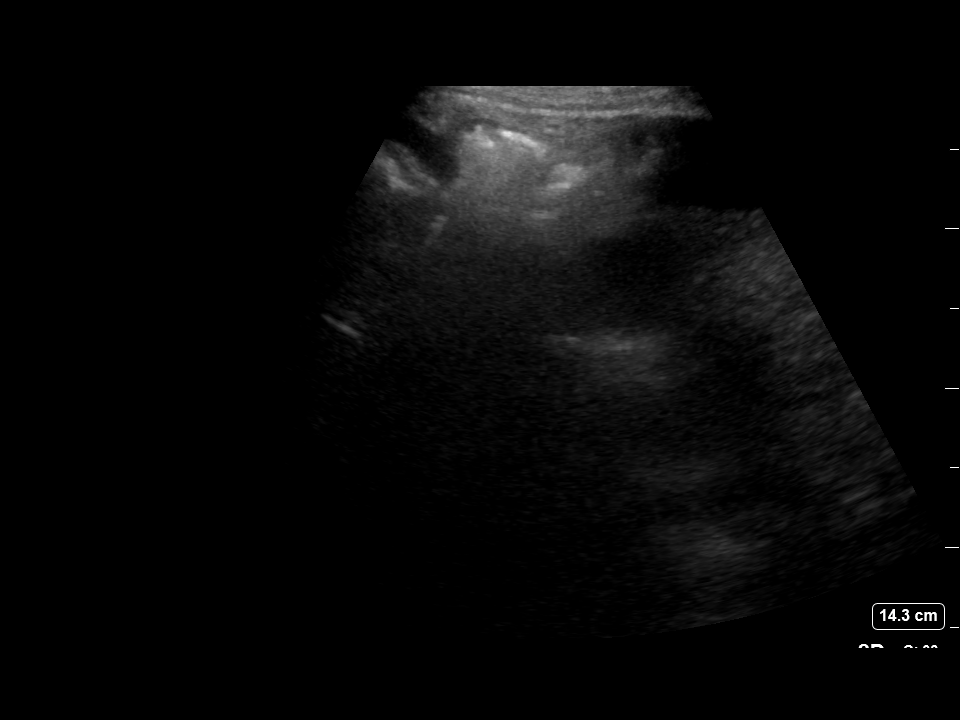
[im 3/4]
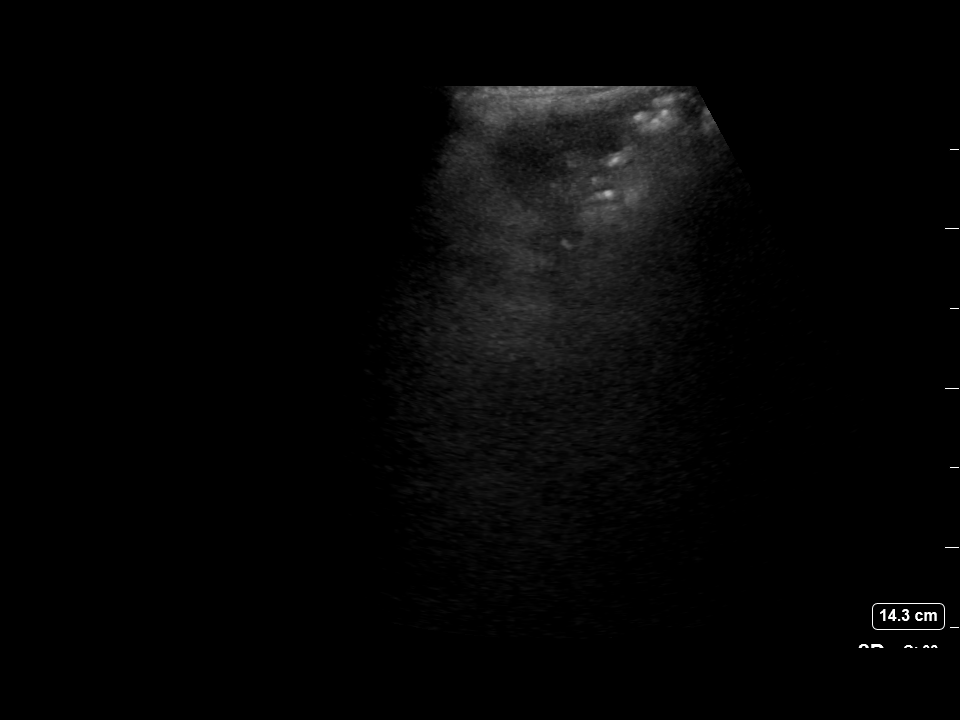
[im 4/4]
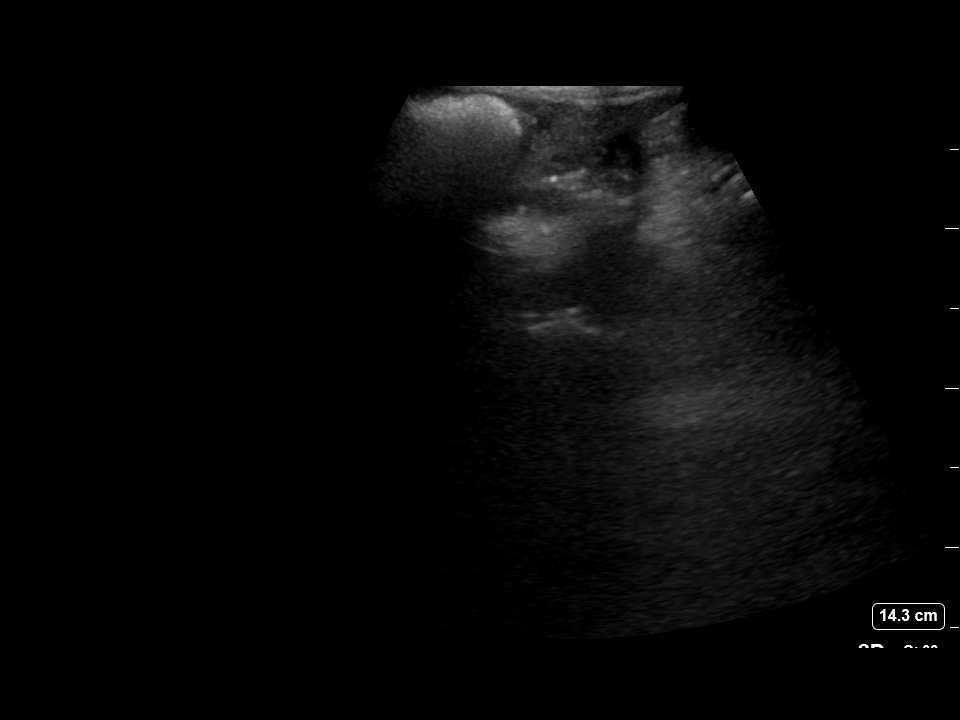

[4 of 4 positions shown; findings below may reference images not displayed]

FINDINGS: Four quadrant survey demonstrates only a trace amount of ascites
scattered throughout the peritoneal cavity. There was not a large
enough pocket of fluid to allow for safe paracentesis.
IMPRESSION: Trace ascites in the peritoneal cavity. Paracentesis was not
performed today.
